# Patient Record
Sex: Male | Born: 1955
Health system: Southern US, Community
[De-identification: ages and names within clinical notes are randomized; demographics above are authoritative.]

## PROBLEM LIST (undated history)

## (undated) DIAGNOSIS — J302 Other seasonal allergic rhinitis: Secondary | ICD-10-CM

## (undated) DIAGNOSIS — C20 Malignant neoplasm of rectum: Secondary | ICD-10-CM

## (undated) DIAGNOSIS — D649 Anemia, unspecified: Secondary | ICD-10-CM

## (undated) DIAGNOSIS — T7840XA Allergy, unspecified, initial encounter: Secondary | ICD-10-CM

## (undated) HISTORY — PX: COLONOSCOPY: SHX174

## (undated) HISTORY — DX: Other seasonal allergic rhinitis: J30.2

## (undated) HISTORY — PX: TONSILLECTOMY: SUR1361

## (undated) HISTORY — DX: Allergy, unspecified, initial encounter: T78.40XA

## (undated) HISTORY — PX: WRIST SURGERY: SHX841

---

## 2000-11-04 ENCOUNTER — Emergency Department (HOSPITAL_COMMUNITY): Admission: EM | Admit: 2000-11-04 | Discharge: 2000-11-04 | Payer: Self-pay | Admitting: Emergency Medicine

## 2002-01-17 ENCOUNTER — Encounter: Payer: Self-pay | Admitting: Emergency Medicine

## 2002-01-17 ENCOUNTER — Emergency Department (HOSPITAL_COMMUNITY): Admission: EM | Admit: 2002-01-17 | Discharge: 2002-01-17 | Payer: Self-pay

## 2002-01-21 ENCOUNTER — Emergency Department (HOSPITAL_COMMUNITY): Admission: EM | Admit: 2002-01-21 | Discharge: 2002-01-21 | Payer: Self-pay | Admitting: Emergency Medicine

## 2002-07-31 ENCOUNTER — Ambulatory Visit (HOSPITAL_BASED_OUTPATIENT_CLINIC_OR_DEPARTMENT_OTHER): Admission: RE | Admit: 2002-07-31 | Discharge: 2002-07-31 | Payer: Self-pay | Admitting: Orthopedic Surgery

## 2003-03-24 ENCOUNTER — Emergency Department (HOSPITAL_COMMUNITY): Admission: EM | Admit: 2003-03-24 | Discharge: 2003-03-24 | Payer: Self-pay | Admitting: Emergency Medicine

## 2004-03-20 ENCOUNTER — Emergency Department (HOSPITAL_COMMUNITY): Admission: EM | Admit: 2004-03-20 | Discharge: 2004-03-20 | Payer: Self-pay | Admitting: Emergency Medicine

## 2005-02-04 ENCOUNTER — Emergency Department (HOSPITAL_COMMUNITY): Admission: EM | Admit: 2005-02-04 | Discharge: 2005-02-04 | Payer: Self-pay | Admitting: Family Medicine

## 2005-05-05 ENCOUNTER — Emergency Department (HOSPITAL_COMMUNITY): Admission: EM | Admit: 2005-05-05 | Discharge: 2005-05-05 | Payer: Self-pay | Admitting: Emergency Medicine

## 2005-05-28 ENCOUNTER — Emergency Department (HOSPITAL_COMMUNITY): Admission: EM | Admit: 2005-05-28 | Discharge: 2005-05-28 | Payer: Self-pay | Admitting: *Deleted

## 2005-05-31 ENCOUNTER — Emergency Department (HOSPITAL_COMMUNITY): Admission: EM | Admit: 2005-05-31 | Discharge: 2005-05-31 | Payer: Self-pay | Admitting: Emergency Medicine

## 2005-06-04 ENCOUNTER — Ambulatory Visit (HOSPITAL_COMMUNITY): Admission: RE | Admit: 2005-06-04 | Discharge: 2005-06-04 | Payer: Self-pay | Admitting: Orthopedic Surgery

## 2005-12-17 ENCOUNTER — Emergency Department (HOSPITAL_COMMUNITY): Admission: EM | Admit: 2005-12-17 | Discharge: 2005-12-17 | Payer: Self-pay | Admitting: Emergency Medicine

## 2006-04-25 ENCOUNTER — Emergency Department (HOSPITAL_COMMUNITY): Admission: EM | Admit: 2006-04-25 | Discharge: 2006-04-25 | Payer: Self-pay | Admitting: Emergency Medicine

## 2011-08-15 ENCOUNTER — Emergency Department (HOSPITAL_COMMUNITY)
Admission: EM | Admit: 2011-08-15 | Discharge: 2011-08-16 | Disposition: A | Discharge status: Home / Self Care | Payer: Self-pay | Attending: Emergency Medicine | Admitting: Emergency Medicine

## 2011-08-15 DIAGNOSIS — W540XXA Bitten by dog, initial encounter: Secondary | ICD-10-CM | POA: Insufficient documentation

## 2011-08-15 DIAGNOSIS — S61209A Unspecified open wound of unspecified finger without damage to nail, initial encounter: Secondary | ICD-10-CM | POA: Insufficient documentation

## 2011-09-14 ENCOUNTER — Emergency Department (HOSPITAL_COMMUNITY)
Admission: EM | Admit: 2011-09-14 | Discharge: 2011-09-14 | Disposition: A | Discharge status: Home / Self Care | Payer: Self-pay | Attending: Emergency Medicine | Admitting: Emergency Medicine

## 2011-09-14 DIAGNOSIS — J309 Allergic rhinitis, unspecified: Secondary | ICD-10-CM | POA: Insufficient documentation

## 2011-09-14 DIAGNOSIS — Z4802 Encounter for removal of sutures: Secondary | ICD-10-CM | POA: Insufficient documentation

## 2011-09-14 MED ORDER — LORATADINE 10 MG PO TABS: 10.0000 mg | ORAL_TABLET | Freq: Every day | ORAL | Status: DC

## 2011-09-14 NOTE — ED Provider Notes (Signed)
History     CSN: [id number] Arrival date & time: 09/14/2011  4:33 PM   First MD Initiated Contact with Patient 09/14/11 1641      No chief complaint on file.   (Consider location/radiation/quality/duration/timing/severity/associated sxs/prior treatment) Patient is a 55 y.o. male presenting with suture removal. The history is provided by the patient.  Suture / Staple Removal  The sutures were placed more than 14 days ago. Treatments since wound repair include regular peroxide and water cleansings. There has been no drainage from the wound. There is no redness present. There is no swelling present. The pain has no pain. He has no difficulty moving the affected extremity or digit.  Pt had 5 loose sutures placed to L thumb about 3 weeks ago s/p dog bite. He has been keeping the area clean and dry and cleaning it regularly with peroxide. Presents today for suture removal; states that one of the sutures fell out on its own but the others are intact. Denies fever, drainage from the site.  He also asks if there's anything he can take over the counter for seasonal allergy sx. States he is sick every year about this time with similar sx - postnasal drip, rhinorrhea, slight cough. Denies SOB, chest tightness/pain. Has been using Tylenol Allergy along with Afrin for the past 2 days.  History reviewed. No pertinent past medical history.  History reviewed. No pertinent past surgical history.  No family history on file.  History  Substance Use Topics  . Smoking status: Not on file  . Smokeless tobacco: Not on file  . Alcohol Use: Not on file      Review of Systems  Constitutional: Negative.   HENT: Positive for congestion, rhinorrhea and postnasal drip.   Respiratory: Positive for cough.   Musculoskeletal: Negative for myalgias.  Skin: Positive for wound. Negative for color change and pallor.  Neurological: Negative for numbness.    Allergies  Review of patient's allergies indicates no  known allergies.  Home Medications  No current outpatient prescriptions on file.  BP 144/98  Pulse 87  Temp 98.7 F (37.1 C)  Resp 18  SpO2 99%  Physical Exam  Nursing note and vitals reviewed. Constitutional: He is oriented to person, place, and time. He appears well-developed and well-nourished. No distress.  HENT:  Head: Normocephalic and atraumatic.  Right Ear: External ear normal.  Left Ear: External ear normal.  Nose: Nose normal.  Mouth/Throat: Oropharynx is clear and moist. No oropharyngeal exudate.  Eyes: Conjunctivae are normal. Pupils are equal, round, and reactive to light.  Neck: Normal range of motion.  Cardiovascular: Normal rate and regular rhythm.   Pulmonary/Chest: Effort normal and breath sounds normal. He has no wheezes.  Musculoskeletal: Normal range of motion.  Neurological: He is alert and oriented to person, place, and time.  Skin: Skin is warm and dry. He is not diaphoretic.       Wound to L thumb well healing; dead skin sloughing from area. 4 loose sutures are in place to palmar aspect, do not appear ingrown; no signs of cellulitis or drainage around the area.    ED Course  SUTURE REMOVAL Performed by: Grant Fontana Authorized by: Grant Fontana Consent: Verbal consent obtained. Risks and benefits: risks, benefits and alternatives were discussed Consent given by: patient Time out: Immediately prior to procedure a "time out" was called to verify the correct patient, procedure, equipment, support staff and site/side marked as required. Body area: upper extremity Location details: left thumb Wound Appearance:  clean Sutures Removed: 4 Post-removal: antibiotic ointment applied and dressing applied Facility: sutures placed in this facility Patient tolerance: Patient tolerated the procedure well with no immediate complications.   (including critical care time)    Labs Reviewed - No data to display No results found.   1. Visit for  suture removal   2. Allergic rhinitis       MDM  Pt tolerated removal well. He was instructed to continue to keep the area clean/dry to promote further wound healing.  Discussed treatment for allergic rhinitis. VSS, pt nontoxic appearing. Exam nonfocal. He was given rx for Claritin. Discussed rhinitis medicamentosa with Afrin; he was instructed not to use for more than 3-5 days. Discussed the use of a neti pot or saline nasal spray instead. Pt verbalized understanding and agreed to plan.        Grant Fontana, [place] 09/15/11 1041

## 2011-09-14 NOTE — ED Notes (Signed)
Patient here to have sutures out from left thumb, in place about 3 weeks, also reports cold symptoms and cough

## 2011-09-15 NOTE — ED Provider Notes (Signed)
Medical screening examination/treatment/procedure(s) were performed by non-physician practitioner and as supervising physician I was immediately available for consultation/collaboration.  Zavian Slowey, MD 09/15/11 1335 

## 2014-02-18 ENCOUNTER — Emergency Department (HOSPITAL_COMMUNITY)
Admission: EM | Admit: 2014-02-18 | Discharge: 2014-02-18 | Disposition: A | Discharge status: Home / Self Care | Payer: Self-pay | Attending: Emergency Medicine | Admitting: Emergency Medicine

## 2014-02-18 ENCOUNTER — Encounter (HOSPITAL_COMMUNITY): Payer: Self-pay | Admitting: Emergency Medicine

## 2014-02-18 DIAGNOSIS — H6120 Impacted cerumen, unspecified ear: Secondary | ICD-10-CM | POA: Insufficient documentation

## 2014-02-18 DIAGNOSIS — Z79899 Other long term (current) drug therapy: Secondary | ICD-10-CM | POA: Insufficient documentation

## 2014-02-18 NOTE — Discharge Instructions (Signed)
Please call your doctor for a followup appointment within 24-48 hours. When you talk to your doctor please let them know that you were seen in the emergency department and have them acquire all of your records so that they can discuss the findings with you and formulate a treatment plan to fully care for your new and ongoing problems. Please call and set-up an appointment with your primary care provider to be re-assessed within the next 24-48 hours Please rest and stay hydrated Please avoid qtips - please use rag to wash the ear  Please continue to monitor symptoms closely and if symptoms are to worsen or change (fever greater than 101, chills, sweating, swelling to the ear, ear pain worsens or changes, numbness, tingling, bleeding from the ear) please report back to the ED immediately    Cerumen Impaction A cerumen impaction is when the wax in your ear forms a plug. This plug usually causes reduced hearing. Sometimes it also causes an earache or dizziness. Removing a cerumen impaction can be difficult and painful. The wax sticks to the ear canal. The canal is sensitive and bleeds easily. If you try to remove a heavy wax buildup with a cotton tipped swab, you may push it in further. Irrigation with water, suction, and small ear curettes may be used to clear out the wax. If the impaction is fixed to the skin in the ear canal, ear drops may be needed for a few days to loosen the wax. People who build up a lot of wax frequently can use ear wax removal products available in your local drugstore. SEEK MEDICAL CARE IF:  You develop an earache, increased hearing loss, or marked dizziness. Document Released: 11/12/2004 Document Revised: 12/28/2011 Document Reviewed: 01/02/2010 [facility] Patient Information 2014 [place], [place].   Emergency Department Resource Guide 1) Find a Doctor and Pay Out of Pocket Although you won't have to find out who is covered by your insurance plan, it is a good idea to ask  around and get recommendations. You will then need to call the office and see if the doctor you have chosen will accept you as a new patient and what types of options they offer for patients who are self-pay. Some doctors offer discounts or will set up payment plans for their patients who do not have insurance, but you will need to ask so you aren't surprised when you get to your appointment.  2) Contact Your Local Health Department Not all health departments have doctors that can see patients for sick visits, but many do, so it is worth a call to see if yours does. If you don't know where your local health department is, you can check in your phone book. The CDC also has a tool to help you locate your state's health department, and many state websites also have listings of all of their local health departments.  3) Find a [facility] If your illness is not likely to be very severe or complicated, you may want to try a walk in clinic. These are popping up all over the country in pharmacies, drugstores, and shopping centers. They're usually staffed by nurse practitioners or physician assistants that have been trained to treat common illnesses and complaints. They're usually fairly quick and inexpensive. However, if you have serious medical issues or chronic medical problems, these are probably not your best option.  No Primary Care Doctor: - Call Health Connect at  [phone] - they can help you locate a primary care doctor that  accepts your insurance, provides certain services, etc. - Physician Referral Service- [phone]  Chronic Pain Problems: Organization         Address  Phone   Notes  [facility]  [phone] Patients need to be referred by their primary care doctor.   Medication Assistance: Organization         Address  Phone   Notes  [facility] Medication [facility] [address] [place], [place] [address] [phone] --Must be a  resident of [facility] -- Must have NO insurance coverage whatsoever (no Medicaid/ Medicare, etc.) -- The pt. MUST have a primary care doctor that directs their care regularly and follows them in the community   MedAssist  [phone]   [employer]  [phone]    Agencies that provide inexpensive medical care: Organization         Address  Phone   Notes  [place]  [phone]   [facility]    [phone]   [facility] [place], [place] [address] [phone]   [place] [address], [place] [phone]   Planned Parenthood    [phone]   [facility]    [phone]   [place] [address], [place] Phone:  [phone], Fax:  [phone] Hours of Operation:  9 am - 6 pm, M-F.  Also accepts Medicaid/Medicare and self-pay.  [facility] [address], [address], [place] Phone: [phone], Fax: [phone]. Hours of Operation:  8:30 am - 5:30 pm, M-F.  Also accepts Medicaid and self-pay.  [facility] [place] [address] [place] Phone: [phone]   [place], [place], [place] [phone], Ext. [phone] Mondays & Thursdays: 7-9 AM.  First 15 patients are seen on a first come, first serve basis.    [place] Providers:  Organization         Address  Phone   Notes  [facility] [address], [place] [phone] Also accepts self-pay patients.  [facility] [address], [place]  [phone]   [place], [address], [place] [phone]   [facility] [address] [place] [phone]   Lucianne Lei [address], [place]   [phone] Only accepts  [place] Access [place] patients after they have their name applied to their card.   Self-Pay (no insurance) in [facility]:  Organization         Address  Phone   Notes  Sickle Cell Patients, [facility] Internal Medicine [place] [phone]   [facility] [place] [phone]   [facility] [place]  [address], [place], [place] [phone]   [facility]/Dr. Osei-Bonsu  [address] [place] or [address], [place] [phone] Phone number for both [place] and [place] locations is the same.  [facility]gent [facility] and [facility] [address], [place] [phone]   [facility] [address], [place] or [address] [phone]   [facility] [place] [phone], phone; [phone], fax Sees patients 1st and 3rd Saturday of every month.  Must not qualify for public or private insurance (i.e. Medicaid, Medicare, [facility] Health Choice, Veterans' Benefits)  Household income should be no more than 200% of the poverty level The clinic cannot treat you if you are pregnant or think you are pregnant  Sexually transmitted diseases are not treated at the clinic.    Dental Care: Organization         Address  Phone  Notes  [facility] Department of [facility] [place] [phone] Accepts children up to age 51 who are enrolled in [place] or [place]; pregnant women with a Medicaid card; and children who have applied for Medicaid or [place] Health Choice, but were declined, whose parents can pay a reduced fee at time of service.  [facility] Department of [facility] [place] [address]  [address], [place] [phone] Accepts children up to age 40 who are enrolled in [place] or [place]; pregnant women  with a Medicaid card; and children who have applied for Medicaid or [place] Health Choice, but were declined, whose parents can pay a reduced fee at time of service.  [place] Adult Dental Access PROGRAM  [place] [phone] Patients are seen by appointment only. Walk-ins are not accepted. [place] will see patients 63 years of age and older. Monday - Tuesday (8am-5pm) Most Wednesdays (8:30-5pm) $30 per visit, cash only  [facility] Adult Dental Access PROGRAM  [address], [facility] [phone] Patients are seen by appointment only. Walk-ins are not accepted. [place] will see patients 73 years of age and older. One Wednesday Evening (Monthly: Volunteer Based).  $30 per visit, cash only  [place]  [phone] for adults; Children under age 1, call Graduate Pediatric Dentistry at [phone]. Children aged 46-14, please call [phone] to request a pediatric application.  Dental services are provided in all areas of dental care including fillings, crowns and bridges, complete and partial dentures, implants, gum treatment, root canals, and extractions. Preventive care is also provided. Treatment is provided to both adults and children. Patients are selected via a lottery and there is often a waiting list.   [facility] [address] [place]  [phone] [email or web address]   [facility] [address] [place], [place] [phone], Ext. [phone] Second and Fourth Thursday of each month, opens at 6:30 AM; Clinic ends at 9 AM.  Patients are seen on a first-come first-served basis, and a limited number are seen during each clinic.   [facility]  [address] Hillard Danker [place], [place] [phone]   Eligibility Requirements You must have lived in [place], [place], or [place] counties for at least the last three months.   You cannot be eligible for state or federal sponsored [employer], including [employer], [place], or [employer].   You generally cannot be eligible for healthcare insurance through your employer.    How to apply: Eligibility screenings are held every Tuesday and Wednesday afternoon from 1:00 pm until 4:00 pm. You do not need an appointment for the interview!  [facility] [address] [place], [place]   [place]  [place] Department  [place] Department  [phone]    Behavioral Health Resources in the Community: Intensive Outpatient Programs Organization         Address  Phone  Notes  [facility] Services [address] [place], [place] [phone]   [facility] Outpatient [address] [place], [place] [phone]   ADS: Alcohol & Drug Svcs [address] [place], [place]  [phone]   [facility] Mental Health [address],  [place], [place] [phone] or [phone]   Substance Abuse Resources Organization         Address  Phone  Notes  Alcohol and Drug Services  [phone]   [facility]  [phone]   [facility] [place]  [phone]   Floydene Flock  [phone]   Residential & Outpatient Substance Abuse Program  [phone]   Psychological Services Organization         Address  Phone  Notes  [facility] Behavioral Health  [phone]   [facility] Services  [phone]   [facility] Mental Health [address], [place] [phone] or [phone]    Mobile Crisis Teams Organization         Address  Phone  Notes  [facility], Mobile Crisis Care Unit  [phone]   [facility]  [address] [place], [place] [phone]   Doristine Locks [address] [place] [phone]    Self-Help/Support Groups Organization         Address  Phone             Notes  Mental  Health Assoc. of [place] - variety of support groups  [phone]- [phone] Call for more information  Narcotics Anonymous (NA), [facility] [address], [employer] [place]  2 meetings at this location   Statistician         Address  Phone  Notes  [facility] [address],    [place]  [phone]   [facility]  [address] [place] [address], [place], [place] [phone]   [facility] [address] [place], [place] [phone] Admissions: 8am-3pm M-F  [facility] [address],    [place], [place] [phone]   [facility] [address] [place], [place], [place] [phone]   [facility] [address],  [place], [place] [phone]   [facility] - Intensive Outpatient [address], [place], [place] [phone]   [facility] (Addiction Recovery Care Assoc.) [address] [place].,  [place], [place] [phone] or [phone]   [facility] Services (RTS) [address] [place], [place] [phone] Accepts Medicaid  [facility] [place] [address]  [place] [phone] Substance Abuse/Addiction Treatment   [facility] Organization         Address  Phone  Notes  [facility]  2126845069   Angie Fava, PhD [address] Ervin Knack [place], [place]   [phone] or [phone]   [facility] Behavioral   [address] [place], [place] [phone]   [facility] [address] [place], [place] [phone] Insurance/Medicaid/sponsorship through [employer] and Families [address]                                    [place], [place] [phone] Therapy/tele-psych/case  [facility] [address] [place]  [address]   [place], [place] [phone]    Dr. Lolly MustacheArfeen  [phone]   [facility] [place]  [employer] [facility] 1) [address],  [place] 2) [address], [place] 3)  [address], [place] [phone]  [phone]   [facility] Child Abuse Hotline [phone]) [phone] or [phone]) [phone] (After Hours)

## 2014-02-18 NOTE — ED Provider Notes (Signed)
CSN: [id number]     Arrival date & time 02/18/14  1450 History  This chart was scribed for non-physician practitioner, Jamse Mead, PA-C working with Kathalene Frames, MD by Frederich Balding, ED scribe. This patient was seen in room [place] and the patient's care was started at 5:00 PM.   Chief Complaint  Patient presents with  . Foreign Body in [place]   The history is provided by the patient. No language interpreter was used.   HPI Comments: Phillip Brooks is a 58 y.o. male who presents to the Emergency Department complaining of a possible foreign body in his left ear that he noticed about 2.5 weeks ago. Pt does a lot of outdoor work and thinks something might have gotten in his ear. He has associated tinnitus and mild hearing loss. Pt has used an OTC ear wax removal (carbamide peroxide 6.5%) in case it was earwax with no relief. He states he has had congestion but it is due to seasonal allergies. Denies fever, chills, sore throat, trouble swallowing, ear pain, ear swelling, ear discharge, blurred vision, sudden loss of vision, chest pain, SOB, cough, difficulty breathing, neck pain, headaches.  History reviewed. No pertinent past medical history. History reviewed. No pertinent past surgical history. History reviewed. No pertinent family history. History  Substance Use Topics  . Smoking status: Never Smoker   . Smokeless tobacco: Not on file  . Alcohol Use: Yes    Review of Systems  Constitutional: Negative for fever and chills.  HENT: Positive for hearing loss and tinnitus. Negative for ear discharge, ear pain, sore throat and trouble swallowing.   Eyes: Negative for visual disturbance.  Respiratory: Negative for cough and shortness of breath.   Cardiovascular: Negative for chest pain.  Musculoskeletal: Negative for neck pain.  Neurological: Negative for headaches.  All other systems reviewed and are negative.  Allergies  Review of patient's allergies indicates no known  allergies.  Home Medications   Prior to Admission medications   Medication Sig Start Date End Date Taking? Authorizing Provider  loratadine (CLARITIN) 10 MG tablet Take 1 tablet (10 mg total) by mouth daily. 09/14/11 09/13/12  Abran Richard, PA-C   BP 159/99  Pulse 76  Temp(Src) 97.7 F (36.5 C) (Oral)  Resp 16  SpO2 98%  Physical Exam  Nursing note and vitals reviewed. Constitutional: He is oriented to person, place, and time. He appears well-developed and well-nourished. No distress.  HENT:  Head: Normocephalic and atraumatic.  Right Ear: Hearing, external ear and ear canal normal. No lacerations. No drainage, swelling or tenderness. No foreign bodies. No mastoid tenderness. Tympanic membrane is not injected, not scarred, not perforated, not erythematous, not retracted and not bulging. No middle ear effusion. No hemotympanum. No decreased hearing is noted.  Left Ear: External ear and ear canal normal. No lacerations. No drainage, swelling or tenderness. No foreign bodies. No mastoid tenderness.  No middle ear effusion. No hemotympanum. Decreased hearing is noted.  Mouth/Throat: Oropharynx is clear and moist. No oropharyngeal exudate.  Cerumen impaction to the left ear - soft.  Eyes: Conjunctivae and EOM are normal. Pupils are equal, round, and reactive to light. Right eye exhibits no discharge. Left eye exhibits no discharge.  Neck: Normal range of motion. Neck supple. No tracheal deviation present.  Negative neck stiffness Negative nuchal rigidity Negative cervical lymphadenopathy Negative pain upon palpation to the c-spine  Cardiovascular: Normal rate, regular rhythm and normal heart sounds.   Pulses:      Radial pulses are  2+ on the right side, and 2+ on the left side.  Pulmonary/Chest: Effort normal and breath sounds normal. No respiratory distress. He has no wheezes. He has no rales.  Patient is able to speak without difficulty Negative use of accessory  muscles Negative stridor  Musculoskeletal: Normal range of motion.  Lymphadenopathy:    He has no cervical adenopathy.  Neurological: He is alert and oriented to person, place, and time.  Skin: Skin is warm and dry.  Psychiatric: He has a normal mood and affect. His behavior is normal.    ED Course  Procedures (including critical care time)  DIAGNOSTIC STUDIES: Oxygen Saturation is 99% on RA, normal by my interpretation.    COORDINATION OF CARE: 5:06 PM-Discussed treatment plan which includes irrigating ear with pt at bedside and pt agreed to plan.   Labs Review Labs Reviewed - No data to display  Imaging Review No results found.   EKG Interpretation None      MDM   Final diagnoses:  Hearing loss secondary to cerumen impaction    Filed Vitals:   02/18/14 1457 02/18/14 1759  BP: 159/99   Pulse: 78 76  Temp: 97.7 F (36.5 C)   TempSrc: Oral   Resp: 16   SpO2: 99% 98%   I personally performed the services described in this documentation, which was scribed in my presence. The recorded information has been reviewed and is accurate.  Cerumen identified in the left ear. This provider removed cerumen by using pediatric curette-cerumen soft, patient tolerated procedure well. Ear was irrigated using warm normal saline by nurse - patient tolerated procedure well. Decent amount of cerumen removed. Inner ear canal seen within negative inflammation, erythema, lesions, sores. TM identified with negative perforation, bulging or signs of infection. Patient able to hear well after the cerumen was removed. Negative tinnitus or hearing loss identified. Patient had instantaneous relief. Patient stable, afebrile. Patient in no signs of distress. Patient tolerated procedure of curette and irrigation well. Discharged patient. Discussed with patient proper ear care. [facility]. Discussed with patient to closely monitor symptoms and if symptoms are  to worsen or change to report back to the ED - strict return instructions given.  Patient agreed to plan of care, understood, all questions answered.   Jamse Mead, PA-C 02/18/14 2120

## 2014-02-18 NOTE — ED Provider Notes (Signed)
Medical screening examination/treatment/procedure(s) were performed by non-physician practitioner and as supervising physician I was immediately available for consultation/collaboration.    Kathalene Frames, MD 02/18/14 [phone]

## 2014-02-18 NOTE — ED Notes (Signed)
Pt states that his lt ear feels like something is crawling in it x 2 wks.

## 2014-02-18 NOTE — ED Notes (Signed)
Reports some hearing loss in left ear and states "I feel something  Moving in there and it's ringing."

## 2016-04-07 ENCOUNTER — Emergency Department (HOSPITAL_COMMUNITY): Payer: Self-pay

## 2016-04-07 ENCOUNTER — Emergency Department (HOSPITAL_COMMUNITY)
Admission: EM | Admit: 2016-04-07 | Discharge: 2016-04-07 | Disposition: A | Discharge status: Home / Self Care | Payer: Self-pay | Attending: Emergency Medicine | Admitting: Emergency Medicine

## 2016-04-07 ENCOUNTER — Encounter (HOSPITAL_COMMUNITY): Payer: Self-pay | Admitting: Emergency Medicine

## 2016-04-07 DIAGNOSIS — W01198A Fall on same level from slipping, tripping and stumbling with subsequent striking against other object, initial encounter: Secondary | ICD-10-CM | POA: Insufficient documentation

## 2016-04-07 DIAGNOSIS — Y990 Civilian activity done for income or pay: Secondary | ICD-10-CM | POA: Insufficient documentation

## 2016-04-07 DIAGNOSIS — S0191XA Laceration without foreign body of unspecified part of head, initial encounter: Secondary | ICD-10-CM

## 2016-04-07 DIAGNOSIS — R55 Syncope and collapse: Secondary | ICD-10-CM | POA: Insufficient documentation

## 2016-04-07 DIAGNOSIS — S0101XA Laceration without foreign body of scalp, initial encounter: Secondary | ICD-10-CM | POA: Insufficient documentation

## 2016-04-07 DIAGNOSIS — R4020 Unspecified coma: Secondary | ICD-10-CM

## 2016-04-07 DIAGNOSIS — W19XXXA Unspecified fall, initial encounter: Secondary | ICD-10-CM

## 2016-04-07 DIAGNOSIS — Y939 Activity, unspecified: Secondary | ICD-10-CM | POA: Insufficient documentation

## 2016-04-07 DIAGNOSIS — Y929 Unspecified place or not applicable: Secondary | ICD-10-CM | POA: Insufficient documentation

## 2016-04-07 DIAGNOSIS — Z79899 Other long term (current) drug therapy: Secondary | ICD-10-CM | POA: Insufficient documentation

## 2016-04-07 DIAGNOSIS — M25511 Pain in right shoulder: Secondary | ICD-10-CM | POA: Insufficient documentation

## 2016-04-07 MED ORDER — OXYCODONE-ACETAMINOPHEN 5-325 MG PO TABS
1.0000 | ORAL_TABLET | Freq: Once | ORAL | Status: AC
Start: 2016-04-07 — End: 2016-04-07
  Administered 2016-04-07: 1 via ORAL
  Filled 2016-04-07: qty 1

## 2016-04-07 MED ORDER — NAPROXEN 250 MG PO TABS: 250.0000 mg | ORAL_TABLET | Freq: Two times a day (BID) | ORAL | Status: DC

## 2016-04-07 MED ORDER — TETANUS-DIPHTH-ACELL PERTUSSIS 5-2.5-18.5 LF-MCG/0.5 IM SUSP
0.5000 mL | Freq: Once | INTRAMUSCULAR | Status: AC
  Administered 2016-04-07: 0.5 mL via INTRAMUSCULAR
  Filled 2016-04-07: qty 0.5

## 2016-04-07 MED ORDER — BACITRACIN ZINC 500 UNIT/GM EX OINT
1.0000 "application " | TOPICAL_OINTMENT | Freq: Two times a day (BID) | CUTANEOUS | Status: DC
  Administered 2016-04-07: 1 via TOPICAL
  Filled 2016-04-07: qty 0.9

## 2016-04-07 MED ORDER — OXYCODONE-ACETAMINOPHEN 5-325 MG PO TABS
1.0000 | ORAL_TABLET | Freq: Once | ORAL | Status: AC
  Administered 2016-04-07: 1 via ORAL
  Filled 2016-04-07: qty 1

## 2016-04-07 MED ORDER — BACITRACIN ZINC 500 UNIT/GM EX OINT: 1.0000 "application " | TOPICAL_OINTMENT | Freq: Two times a day (BID) | CUTANEOUS | Status: DC

## 2016-04-07 NOTE — ED Notes (Signed)
Pt ambulatory to waiting room with coworkers. NAD. Refused wheelchair. Verbalizes understanding of discharge instructions.

## 2016-04-07 NOTE — ED Provider Notes (Signed)
CSN: [id number]     Arrival date & time 04/07/16  1202 History   First MD Initiated Contact with Patient 04/07/16 1213     Chief Complaint  Patient presents with  . Fall  . Loss of Consciousness    Phillip Brooks is a 60 y.o. male who presents to the ED after a fall with LOC. 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 This caused him to fall backwards onto his head onto concrete. He had positive loss of consciousness that was witnessed. No seizure-like activity. He complains of pain to the back of his head, right neck pain and right shoulder pain. He reports initially when he came to he felt all his extremities were numb and he could not move them. He reports within seconds his sensation and function returned. He reports feeling back to normal currently as far as his sensation and movement. He is unsure when his last tetanus shot was. He is not on anticoagulants. The patient denies fevers, cough, chest pain, abdominal pain, nausea, vomiting, diarrhea, urinary symptoms, double vision.   Patient is a 60 y.o. male presenting with fall and syncope. The history is provided by the patient. No language interpreter was used.  Fall Associated symptoms include headaches and neck pain. Pertinent negatives include no abdominal pain, chest pain, chills, coughing, fever, nausea, numbness, rash, vomiting or weakness.  Loss of Consciousness Associated symptoms: headaches   Associated symptoms: no chest pain, no dizziness, no fever, no nausea, no seizures, no shortness of breath, no vomiting and no weakness     History reviewed. No pertinent past medical history. History reviewed. No pertinent past surgical history. History reviewed. No pertinent family history. Social History  Substance Use Topics  . Smoking status: Never Smoker   . Smokeless tobacco: None  . Alcohol Use: Yes    Review of Systems  Constitutional:  Negative for fever and chills.  HENT: Negative for ear discharge, ear pain, nosebleeds and rhinorrhea.   Eyes: Negative for pain and visual disturbance.  Respiratory: Negative for cough and shortness of breath.   Cardiovascular: Positive for syncope. Negative for chest pain.  Gastrointestinal: Negative for nausea, vomiting, abdominal pain and diarrhea.  Genitourinary: Negative for dysuria, hematuria and difficulty urinating.  Musculoskeletal: Positive for neck pain. Negative for back pain.  Skin: Positive for wound. Negative for rash.  Neurological: Positive for headaches. Negative for dizziness, seizures, speech difficulty, weakness, light-headedness and numbness.      Allergies  Review of patient's allergies indicates no known allergies.  Home Medications   Prior to Admission medications   Medication Sig Start Date End Date Taking? Authorizing Provider  bacitracin ointment Apply 1 application topically 2 (two) times daily. 04/07/16   Waynetta Pean, PA-C  loratadine (CLARITIN) 10 MG tablet Take 1 tablet (10 mg total) by mouth daily. 09/14/11 09/13/12  Abran Richard, PA-C  naproxen (NAPROSYN) 250 MG tablet Take 1 tablet (250 mg total) by mouth 2 (two) times daily with a meal. 04/07/16   Waynetta Pean, PA-C   BP 134/95 mmHg  Pulse 77  Temp(Src) 97.5 F (36.4 C) (Oral)  Resp 19  Ht 5\' 6"  (1.676 m)  Wt 68.04 kg  BMI 24.22 kg/m2  SpO2 97% Physical Exam  Constitutional: He is oriented to person, place, and time. He appears well-developed and well-nourished. No distress.  Nontoxic appearing.  HENT:  Head: Normocephalic and atraumatic.  Right  Ear: External ear normal.  Left Ear: External ear normal.  Mouth/Throat: Oropharynx is clear and moist.  5 cm irregular laceration to his posterior head. Bleeding is controlled. No other signs of head trauma.  Bilateral tympanic membranes are pearly-gray without erythema or loss of landmarks.   Eyes: Conjunctivae and EOM are normal.  Pupils are equal, round, and reactive to light. Right eye exhibits no discharge. Left eye exhibits no discharge.  Neck: Neck supple. No tracheal deviation present.  Wearing C-collar.   Cardiovascular: Normal rate, regular rhythm, normal heart sounds and intact distal pulses.  Exam reveals no gallop and no friction rub.   No murmur heard. Pulmonary/Chest: Effort normal and breath sounds normal. No stridor. No respiratory distress. He has no wheezes. He has no rales. He exhibits no tenderness.  Lungs are clear to auscultation bilaterally. No crepitus. Symmetric chest expansion bilaterally.  Abdominal: Soft. He exhibits no distension. There is no tenderness. There is no guarding.  Abdomen is soft and nontender to palpation.  Musculoskeletal: Normal range of motion. He exhibits tenderness. He exhibits no edema.  Patient has mild tenderness over his right scapula. No overlying skin changes. No clavicle tenderness bilaterally. No midline back tenderness. His bilateral elbow, wrists, hips, knee and ankle joints are supple and nontender to palpation. His good range of motion of his bilateral shoulders without pain. No deformity noted.  Lymphadenopathy:    He has no cervical adenopathy.  Neurological: He is alert and oriented to person, place, and time. No cranial nerve deficit. Coordination normal.  The patient is alert and oriented 3. Cranial nerves are intact. Sensation is intact to his bilateral upper and lower extremities. Speech is clear and coherent.  Skin: Skin is warm and dry. No rash noted. He is not diaphoretic. No erythema. No pallor.  Psychiatric: He has a normal mood and affect. His behavior is normal.  Nursing note and vitals reviewed.   ED Course  .Marland KitchenLaceration Repair Date/Time: 04/07/2016 2:45 PM Performed by: Waynetta Pean Authorized by: Waynetta Pean Consent: Verbal consent obtained. Risks and benefits: risks, benefits and alternatives were discussed Consent given by:  patient Patient understanding: patient states understanding of the procedure being performed Patient consent: the patient's understanding of the procedure matches consent given Procedure consent: procedure consent matches procedure scheduled Relevant documents: relevant documents present and verified Test results: test results available and properly labeled Site marked: the operative site was marked Imaging studies: imaging studies available Required items: required blood products, implants, devices, and special equipment available Patient identity confirmed: arm band and verbally with patient Time out: Immediately prior to procedure a "time out" was called to verify the correct patient, procedure, equipment, support staff and site/side marked as required. Body area: head/neck Location details: scalp Laceration length: 5 cm Foreign bodies: no foreign bodies Tendon involvement: none Nerve involvement: none Vascular damage: no Patient sedated: no Preparation: Patient was prepped and draped in the usual sterile fashion. Irrigation solution: saline Irrigation method: jet lavage and syringe Amount of cleaning: extensive Debridement: none Degree of undermining: none Skin closure: staples Number of sutures: 5 Technique: simple Approximation: close Approximation difficulty: simple Dressing: non-adhesive packing strip and antibiotic ointment Patient tolerance: Patient tolerated the procedure well with no immediate complications   (including critical care time) Labs Review Labs Reviewed - No data to display  Imaging Review Dg Shoulder Right  04/07/2016  CLINICAL DATA:  The patient fell this morning with a right shoulder injury. Pain. Initial encounter. EXAM: RIGHT SHOULDER - 2+ VIEW  COMPARISON:  None. FINDINGS: No acute bony or joint abnormality is identified. Mild acromioclavicular degenerative change is noted. Image right lung and ribs appear normal. IMPRESSION: No acute abnormality.  Electronically Signed   By: Inge Rise M.D.   On: 04/07/2016 13:11   Ct Head Wo Contrast  04/07/2016  CLINICAL DATA:  Syncope and fall today with a blow to the head. Initial encounter. EXAM: CT HEAD WITHOUT CONTRAST CT CERVICAL SPINE WITHOUT CONTRAST TECHNIQUE: Multidetector CT imaging of the head and cervical spine was performed following the standard protocol without intravenous contrast. Multiplanar CT image reconstructions of the cervical spine were also generated. COMPARISON:  None. FINDINGS: CT HEAD FINDINGS There is mild cortical atrophy. No evidence of acute intracranial abnormality including hemorrhage, infarct, mass lesion, mass effect, midline shift or abnormal extra-axial fluid collection is identified. Hematoma over the posterior calvarium is noted. No underlying fracture or foreign body. Imaged paranasal sinuses and mastoid air cells are clear. No fracture. CT CERVICAL SPINE FINDINGS Vertebral height and alignment are maintained. Intervertebral disc space height is normal. Lung apices are clear. Paraspinous soft tissue structures are unremarkable. IMPRESSION: Scalp contusion posteriorly. Negative for underlying fracture or or acute intracranial abnormality. No acute abnormality cervical spine. Electronically Signed   By: Inge Rise M.D.   On: 04/07/2016 13:34   Ct Cervical Spine Wo Contrast  04/07/2016  CLINICAL DATA:  Syncope and fall today with a blow to the head. Initial encounter. EXAM: CT HEAD WITHOUT CONTRAST CT CERVICAL SPINE WITHOUT CONTRAST TECHNIQUE: Multidetector CT imaging of the head and cervical spine was performed following the standard protocol without intravenous contrast. Multiplanar CT image reconstructions of the cervical spine were also generated. COMPARISON:  None. FINDINGS: CT HEAD FINDINGS There is mild cortical atrophy. No evidence of acute intracranial abnormality including hemorrhage, infarct, mass lesion, mass effect, midline shift or abnormal extra-axial  fluid collection is identified. Hematoma over the posterior calvarium is noted. No underlying fracture or foreign body. Imaged paranasal sinuses and mastoid air cells are clear. No fracture. CT CERVICAL SPINE FINDINGS Vertebral height and alignment are maintained. Intervertebral disc space height is normal. Lung apices are clear. Paraspinous soft tissue structures are unremarkable. IMPRESSION: Scalp contusion posteriorly. Negative for underlying fracture or or acute intracranial abnormality. No acute abnormality cervical spine. Electronically Signed   By: Inge Rise M.D.   On: 04/07/2016 13:34   I have personally reviewed and evaluated these images and lab results as part of my medical decision-making.   EKG Interpretation None     Filed Vitals:   04/07/16 1210  BP: 134/95  Pulse: 77  Temp: 97.5 F (36.4 C)  TempSrc: Oral  Resp: 19  Height: 5\' 6"  (1.676 m)  Weight: 68.04 kg  SpO2: 97%    MDM   Meds given in ED:  Medications  bacitracin ointment 1 application (1 application Topical Given 04/07/16 1502)  oxyCODONE-acetaminophen (PERCOCET/ROXICET) 5-325 MG per tablet 1 tablet (1 tablet Oral Given 04/07/16 1339)  Tdap (BOOSTRIX) injection 0.5 mL (0.5 mLs Intramuscular Given 04/07/16 1340)  oxyCODONE-acetaminophen (PERCOCET/ROXICET) 5-325 MG per tablet 1 tablet (1 tablet Oral Given 04/07/16 1502)    New Prescriptions   BACITRACIN OINTMENT    Apply 1 application topically 2 (two) times daily.   NAPROXEN (NAPROSYN) 250 MG TABLET    Take 1 tablet (250 mg total) by mouth 2 (two) times daily with a meal.    Final diagnoses:  Fall, initial encounter  LOC (loss of consciousness)  Laceration of head, initial  encounter  Right shoulder pain   This is a 60 y.o. male who presents to the ED after a fall with LOC. 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 This caused him to fall backwards onto his head  onto concrete. He had positive loss of consciousness that was witnessed. No seizure-like activity. He complains of pain to the back of his head, right neck pain and right shoulder pain. He reports initially when he came to he felt all his extremities were numb and he could not move them. He reports within seconds his sensation and function returned. He reports feeling back to normal currently as far as his sensation and movement. On exam the patient is afebrile nontoxic appearing. He has no focal neurological deficits. He has a 5 cm irregular laceration to his posterior head. No other visible signs of injury. He is neurovascularly intact. CT head and neck are unremarkable. Right shoulder x-ray is unremarkable. Patient provided with updated Tdap in the emergency department. His laceration was repaired by me and tolerated well by the patient. It was repaired with 5 staples.  I discussed the signs and symptoms of a concussion. Discussed the expected treatment and course of a concussion. I advised he needs to refrain from strenuous activity until he is symptom-free and cleared by his primary care doctor or neurology. I encouraged him to use naproxen or tylenol for pain. I discussed strict and specific return precautions. I advised the patient to follow-up with their primary care provider this week. I advised the patient to return to the emergency department with new or worsening symptoms or new concerns. The patient verbalized understanding and agreement with plan.      Waynetta Pean, PA-C 04/07/16 1513  Julianne Rice, MD 04/08/16 1537

## 2016-04-07 NOTE — Discharge Instructions (Signed)
Your scans of your head and neck showed no concerning findings. Your shoulder x-ray was good. No strenuous activty until cleared by primary care or neurology for concussion.   Concussion, Adult A concussion, or closed-head injury, is a brain injury caused by a direct blow to the head or by a quick and sudden movement (jolt) of the head or neck. Concussions are usually not life-threatening. Even so, the effects of a concussion can be serious. If you have had a concussion before, you are more likely to experience concussion-like symptoms after a direct blow to the head.  CAUSES  Direct blow to the head, such as from running into another player during a soccer game, being hit in a fight, or hitting your head on a hard surface.  A jolt of the head or neck that causes the brain to move back and forth inside the skull, such as in a car crash. SIGNS AND SYMPTOMS The signs of a concussion can be hard to notice. Early on, they may be missed by you, family members, and health care providers. You may look fine but act or feel differently. Symptoms are usually temporary, but they may last for days, weeks, or even longer. Some symptoms may appear right away while others may not show up for hours or days. Every head injury is different. Symptoms include:  Mild to moderate headaches that will not go away.  A feeling of pressure inside your head.  Having more trouble than usual:  Learning or remembering things you have heard.  Answering questions.  Paying attention or concentrating.  Organizing daily tasks.  Making decisions and solving problems.  Slowness in thinking, acting or reacting, speaking, or reading.  Getting lost or being easily confused.  Feeling tired all the time or lacking energy (fatigued).  Feeling drowsy.  Sleep disturbances.  Sleeping more than usual.  Sleeping less than usual.  Trouble falling asleep.  Trouble sleeping (insomnia).  Loss of balance or feeling  lightheaded or dizzy.  Nausea or vomiting.  Numbness or tingling.  Increased sensitivity to:  Sounds.  Lights.  Distractions.  Vision problems or eyes that tire easily.  Diminished sense of taste or smell.  Ringing in the ears.  Mood changes such as feeling sad or anxious.  Becoming easily irritated or angry for little or no reason.  Lack of motivation.  Seeing or hearing things other people do not see or hear (hallucinations). DIAGNOSIS Your health care provider can usually diagnose a concussion based on a description of your injury and symptoms. He or she will ask whether you passed out (lost consciousness) and whether you are having trouble remembering events that happened right before and during your injury. Your evaluation might include:  A brain scan to look for signs of injury to the brain. Even if the test shows no injury, you may still have a concussion.  Blood tests to be sure other problems are not present. TREATMENT  Concussions are usually treated in an emergency department, in urgent care, or at a clinic. You may need to stay in the hospital overnight for further treatment.  Tell your health care provider if you are taking any medicines, including prescription medicines, over-the-counter medicines, and natural remedies. Some medicines, such as blood thinners (anticoagulants) and aspirin, may increase the chance of complications. Also tell your health care provider whether you have had alcohol or are taking illegal drugs. This information may affect treatment.  Your health care provider will send you home with important instructions  to follow.  How fast you will recover from a concussion depends on many factors. These factors include how severe your concussion is, what part of your brain was injured, your age, and how healthy you were before the concussion.  Most people with mild injuries recover fully. Recovery can take time. In general, recovery is slower in  older persons. Also, persons who have had a concussion in the past or have other medical problems may find that it takes longer to recover from their current injury. HOME CARE INSTRUCTIONS General Instructions  Carefully follow the directions your health care provider gave you.  Only take over-the-counter or prescription medicines for pain, discomfort, or fever as directed by your health care provider.  Take only those medicines that your health care provider has approved.  Do not drink alcohol until your health care provider says you are well enough to do so. Alcohol and certain other drugs may slow your recovery and can put you at risk of further injury.  If it is harder than usual to remember things, write them down.  If you are easily distracted, try to do one thing at a time. For example, do not try to watch TV while fixing dinner.  Talk with family members or close friends when making important decisions.  Keep all follow-up appointments. Repeated evaluation of your symptoms is recommended for your recovery.  Watch your symptoms and tell others to do the same. Complications sometimes occur after a concussion. Older adults with a brain injury may have a higher risk of serious complications, such as a blood clot on the brain.  Tell your teachers, school nurse, school counselor, coach, athletic trainer, or work Freight forwarder about your injury, symptoms, and restrictions. Tell them about what you can or cannot do. They should watch for:  Increased problems with attention or concentration.  Increased difficulty remembering or learning new information.  Increased time needed to complete tasks or assignments.  Increased irritability or decreased ability to cope with stress.  Increased symptoms.  Rest. Rest helps the brain to heal. Make sure you:  Get plenty of sleep at night. Avoid staying up late at night.  Keep the same bedtime hours on weekends and weekdays.  Rest during the day.  Take daytime naps or rest breaks when you feel tired.  Limit activities that require a lot of thought or concentration. These include:  Doing homework or job-related work.  Watching TV.  Working on the computer.  Avoid any situation where there is potential for another head injury (football, hockey, soccer, basketball, martial arts, downhill snow sports and horseback riding). Your condition will get worse every time you experience a concussion. You should avoid these activities until you are evaluated by the appropriate follow-up health care providers. Returning To Your Regular Activities You will need to return to your normal activities slowly, not all at once. You must give your body and brain enough time for recovery.  Do not return to sports or other athletic activities until your health care provider tells you it is safe to do so.  Ask your health care provider when you can drive, ride a bicycle, or operate heavy machinery. Your ability to react may be slower after a brain injury. Never do these activities if you are dizzy.  Ask your health care provider about when you can return to work or school. Preventing Another Concussion It is very important to avoid another brain injury, especially before you have recovered. In rare cases, another injury can  lead to permanent brain damage, brain swelling, or death. The risk of this is greatest during the first 7-10 days after a head injury. Avoid injuries by:  Wearing a seat belt when riding in a car.  Drinking alcohol only in moderation.  Wearing a helmet when biking, skiing, skateboarding, skating, or doing similar activities.  Avoiding activities that could lead to a second concussion, such as contact or recreational sports, until your health care provider says it is okay.  Taking safety measures in your home.  Remove clutter and tripping hazards from floors and stairways.  Use grab bars in bathrooms and handrails by stairs.  Place  non-slip mats on floors and in bathtubs.  Improve lighting in dim areas. SEEK MEDICAL CARE IF:  You have increased problems paying attention or concentrating.  You have increased difficulty remembering or learning new information.  You need more time to complete tasks or assignments than before.  You have increased irritability or decreased ability to cope with stress.  You have more symptoms than before. Seek medical care if you have any of the following symptoms for more than 2 weeks after your injury:  Lasting (chronic) headaches.  Dizziness or balance problems.  Nausea.  Vision problems.  Increased sensitivity to noise or light.  Depression or mood swings.  Anxiety or irritability.  Memory problems.  Difficulty concentrating or paying attention.  Sleep problems.  Feeling tired all the time. SEEK IMMEDIATE MEDICAL CARE IF:  You have severe or worsening headaches. These may be a sign of a blood clot in the brain.  You have weakness (even if only in one hand, leg, or part of the face).  You have numbness.  You have decreased coordination.  You vomit repeatedly.  You have increased sleepiness.  One pupil is larger than the other.  You have convulsions.  You have slurred speech.  You have increased confusion. This may be a sign of a blood clot in the brain.  You have increased restlessness, agitation, or irritability.  You are unable to recognize people or places.  You have neck pain.  It is difficult to wake you up.  You have unusual behavior changes.  You lose consciousness. MAKE SURE YOU:  Understand these instructions.  Will watch your condition.  Will get help right away if you are not doing well or get worse.   This information is not intended to replace advice given to you by your health care provider. Make sure you discuss any questions you have with your health care provider.   Document Released: 12/26/2003 Document Revised:  10/26/2014 Document Reviewed: 04/27/2013 Elsevier Interactive Patient Education 2016 [place], Adult A laceration is a cut that goes through all of the layers of the skin and into the tissue that is right under the skin. Some lacerations heal on their own. Others need to be closed with stitches (sutures), staples, skin adhesive strips, or skin glue. Proper laceration care minimizes the risk of infection and helps the laceration to heal better. HOW TO CARE FOR YOUR LACERATION If sutures or staples were used:  Keep the wound clean and dry.  If you were given a bandage (dressing), you should change it at least one time per day or as told by your health care provider. You should also change it if it becomes wet or dirty.  Keep the wound completely dry for the first 24 hours or as told by your health care provider. After that time, you may shower or  bathe. However, make sure that the wound is not soaked in water until after the sutures or staples have been removed.  Clean the wound one time each day or as told by your health care provider:  Wash the wound with soap and water.  Rinse the wound with water to remove all soap.  Pat the wound dry with a clean towel. Do not rub the wound.  After cleaning the wound, apply a thin layer of antibiotic ointmentas told by your health care provider. This will help to prevent infection and keep the dressing from sticking to the wound.  Have the sutures or staples removed as told by your health care provider. If skin adhesive strips were used:  Keep the wound clean and dry.  If you were given a bandage (dressing), you should change it at least one time per day or as told by your health care provider. You should also change it if it becomes dirty or wet.  Do not get the skin adhesive strips wet. You may shower or bathe, but be careful to keep the wound dry.  If the wound gets wet, pat it dry with a clean towel. Do not rub the  wound.  Skin adhesive strips fall off on their own. You may trim the strips as the wound heals. Do not remove skin adhesive strips that are still stuck to the wound. They will fall off in time. If skin glue was used:  Try to keep the wound dry, but you may briefly wet it in the shower or bath. Do not soak the wound in water, such as by swimming.  After you have showered or bathed, gently pat the wound dry with a clean towel. Do not rub the wound.  Do not do any activities that will make you sweat heavily until the skin glue has fallen off on its own.  Do not apply liquid, cream, or ointment medicine to the wound while the skin glue is in place. Using those may loosen the film before the wound has healed.  If you were given a bandage (dressing), you should change it at least one time per day or as told by your health care provider. You should also change it if it becomes dirty or wet.  If a dressing is placed over the wound, be careful not to apply tape directly over the skin glue. Doing that may cause the glue to be pulled off before the wound has healed.  Do not pick at the glue. The skin glue usually remains in place for 5-10 days, then it falls off of the skin. General Instructions  Take over-the-counter and prescription medicines only as told by your health care provider.  If you were prescribed an antibiotic medicine or ointment, take or apply it as told by your doctor. Do not stop using it even if your condition improves.  To help prevent scarring, make sure to cover your wound with sunscreen whenever you are outside after stitches are removed, after adhesive strips are removed, or when glue remains in place and the wound is healed. Make sure to wear a sunscreen of at least 30 SPF.  Do not scratch or pick at the wound.  Keep all follow-up visits as told by your health care provider. This is important.  Check your wound every day for signs of infection. Watch for:  Redness,  swelling, or pain.  Fluid, blood, or pus.  Raise (elevate) the injured area above the level of your heart while  you are sitting or lying down, if possible. SEEK MEDICAL CARE IF:  You received a tetanus shot and you have swelling, severe pain, redness, or bleeding at the injection site.  You have a fever.  A wound that was closed breaks open.  You notice a bad smell coming from your wound or your dressing.  You notice something coming out of the wound, such as wood or glass.  Your pain is not controlled with medicine.  You have increased redness, swelling, or pain at the site of your wound.  You have fluid, blood, or pus coming from your wound.  You notice a change in the color of your skin near your wound.  You need to change the dressing frequently due to fluid, blood, or pus draining from the wound.  You develop a new rash.  You develop numbness around the wound. SEEK IMMEDIATE MEDICAL CARE IF:  You develop severe swelling around the wound.  Your pain suddenly increases and is severe.  You develop painful lumps near the wound or on skin that is anywhere on your body.  You have a red streak going away from your wound.  The wound is on your hand or foot and you cannot properly move a finger or toe.  The wound is on your hand or foot and you notice that your fingers or toes look pale or bluish.   This information is not intended to replace advice given to you by your health care provider. Make sure you discuss any questions you have with your health care provider.   Document Released: 10/05/2005 Document Revised: 02/19/2015 Document Reviewed: 10/01/2014 Elsevier Interactive Patient Education [employer].
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
## 2016-08-04 ENCOUNTER — Encounter: Payer: Self-pay | Admitting: Pediatric Intensive Care

## 2016-08-04 DIAGNOSIS — Z136 Encounter for screening for cardiovascular disorders: Secondary | ICD-10-CM

## 2016-08-17 ENCOUNTER — Encounter: Payer: Self-pay | Admitting: Pediatric Intensive Care

## 2016-08-26 DIAGNOSIS — Z136 Encounter for screening for cardiovascular disorders: Secondary | ICD-10-CM | POA: Insufficient documentation

## 2016-08-26 NOTE — Congregational Nurse Program (Signed)
Congregational Nurse Program Note  Date of Encounter: 08/04/2016  Past Medical History: No past medical history on file.  Encounter Details:     CNP Questionnaire - 08/04/16 1109      Patient Demographics   Is this a new or existing patient? New   Patient is considered a/an Not Applicable   Race African-American/Black     Patient Assistance   Location of Patient Assistance [facility]   Patient's financial/insurance status Self-Pay (Uninsured)   Uninsured Patient ([facility] Oncologist) Yes   Interventions Not Applicable   Patient referred to apply for the following financial assistance Medicaid   Food insecurities addressed Not Applicable   Transportation assistance No   Assistance securing medications No   Educational health offerings Hypertension     Encounter Details   Primary purpose of visit Education/Health Concerns   Was an Emergency Department visit averted? Not Applicable   Does patient have a medical provider? No   Patient referred to Other   Was a mental health screening completed? (GAINS tool) No   Does patient have dental issues? No   Does patient have vision issues? No   Does your patient have an abnormal blood pressure today? Yes   Since previous encounter, have you referred patient for abnormal blood pressure that resulted in a new diagnosis or medication change? No   Does your patient have an abnormal blood glucose today? No   Since previous encounter, have you referred patient for abnormal blood glucose that resulted in a new diagnosis or medication change? No   Was there a life-saving intervention made? No     Initial screening with CN. Client reports family history of hypertension but states that he does not have HTN but would like a BP check. BP elevated. Recommend client follow up with CN for re-check. Client denies headache, vision changes.

## 2016-08-26 NOTE — Congregational Nurse Program (Signed)
Congregational Nurse Program Note  Date of Encounter: 08/17/2016  Past Medical History: No past medical history on file.  Encounter Details:     CNP Questionnaire - 08/17/16 1125      Patient Demographics   Is this a new or existing patient? Existing   Patient is considered a/an Not Applicable     Patient Assistance   Location of Patient Assistance [facility]   Patient's financial/insurance status Self-Pay (Uninsured)   Uninsured Patient ([facility] Oncologist) Yes   Interventions Not Applicable   Patient referred to apply for the following financial assistance Medicaid   Food insecurities addressed Not Applicable   Transportation assistance No   Assistance securing medications No   Educational health offerings Hypertension     Encounter Details   Primary purpose of visit Education/Health Concerns   Was an Emergency Department visit averted? Not Applicable   Does patient have a medical provider? No   Patient referred to Not Applicable   Was a mental health screening completed? (GAINS tool) No   Does patient have dental issues? No   Does patient have vision issues? No   Does your patient have an abnormal blood pressure today? No   Since previous encounter, have you referred patient for abnormal blood pressure that resulted in a new diagnosis or medication change? No   Does your patient have an abnormal blood glucose today? No   Since previous encounter, have you referred patient for abnormal blood glucose that resulted in a new diagnosis or medication change? No   Was there a life-saving intervention made? No     CN follow up for BP. Advised client to follow up in CN clinic as needed.

## 2016-09-14 ENCOUNTER — Encounter: Payer: Self-pay | Admitting: Pediatric Intensive Care

## 2016-09-26 NOTE — Congregational Nurse Program (Signed)
Congregational Nurse Program Note  Date of Encounter: 09/14/2016  Past Medical History: No past medical history on file.  Encounter Details:     CNP Questionnaire - 09/26/16 [id number]      Patient Demographics   Is this a new or existing patient? Existing   Patient is considered a/an Not Applicable   Race African-American/Black     Patient Assistance   Location of Patient Assistance [facility]   Patient's financial/insurance status Self-Pay (Uninsured)   Uninsured Patient ([facility] Oncologist) Yes   Interventions Not Applicable   Patient referred to apply for the following financial assistance Medicaid   Food insecurities addressed Not Applicable   Transportation assistance No   Assistance securing medications No   Educational health offerings Hypertension;Navigating the healthcare system     Encounter Details   Primary purpose of visit Navigating the Healthcare System;Education/Health Concerns   Was an Emergency Department visit averted? Not Applicable   Does patient have a medical provider? No   Patient referred to Not Applicable   Was a mental health screening completed? (GAINS tool) No   Does patient have dental issues? No   Does patient have vision issues? No   Does your patient have an abnormal blood pressure today? No   Since previous encounter, have you referred patient for abnormal blood pressure that resulted in a new diagnosis or medication change? No   Does your patient have an abnormal blood glucose today? No   Since previous encounter, have you referred patient for abnormal blood glucose that resulted in a new diagnosis or medication change? No   Was there a life-saving intervention made? No     BP check

## 2016-10-16 ENCOUNTER — Encounter: Payer: Self-pay | Admitting: Pediatric Intensive Care

## 2016-10-22 NOTE — Congregational Nurse Program (Signed)
Congregational Nurse Program Note  Date of Encounter: 10/16/2016  Past Medical History: No past medical history on file.  Encounter Details:     CNP Questionnaire - 10/16/16 0930      Patient Demographics   Is this a new or existing patient? Existing   Patient is considered a/an Not Applicable   Race African-American/Black     Patient Assistance   Location of Patient Assistance [facility]   Patient's financial/insurance status Self-Pay (Uninsured)   Uninsured Patient ([facility] Oncologist) Yes   Interventions Not Applicable   Patient referred to apply for the following financial assistance Not Applicable   Food insecurities addressed Not Applicable   Transportation assistance No   Assistance securing medications No   Educational health offerings Hypertension;Navigating the healthcare system     Encounter Details   Primary purpose of visit Other;Education/Health Concerns   Was an Emergency Department visit averted? Not Applicable   Does patient have a medical provider? No   Patient referred to Not Applicable   Was a mental health screening completed? (GAINS tool) No   Does patient have dental issues? No   Does patient have vision issues? No   Does your patient have an abnormal blood pressure today? No   Since previous encounter, have you referred patient for abnormal blood pressure that resulted in a new diagnosis or medication change? No   Does your patient have an abnormal blood glucose today? No   Since previous encounter, have you referred patient for abnormal blood glucose that resulted in a new diagnosis or medication change? No   Was there a life-saving intervention made? No     BP check

## 2017-03-06 ENCOUNTER — Emergency Department (HOSPITAL_COMMUNITY): Payer: Self-pay

## 2017-03-06 ENCOUNTER — Encounter (HOSPITAL_COMMUNITY): Payer: Self-pay | Admitting: Oncology

## 2017-03-06 ENCOUNTER — Emergency Department (HOSPITAL_COMMUNITY)
Admission: EM | Admit: 2017-03-06 | Discharge: 2017-03-07 | Disposition: A | Discharge status: Home / Self Care | Payer: Self-pay | Attending: Emergency Medicine | Admitting: Emergency Medicine

## 2017-03-06 DIAGNOSIS — R52 Pain, unspecified: Secondary | ICD-10-CM

## 2017-03-06 DIAGNOSIS — R109 Unspecified abdominal pain: Secondary | ICD-10-CM | POA: Insufficient documentation

## 2017-03-06 DIAGNOSIS — Z79899 Other long term (current) drug therapy: Secondary | ICD-10-CM | POA: Insufficient documentation

## 2017-03-06 DIAGNOSIS — N5082 Scrotal pain: Secondary | ICD-10-CM | POA: Insufficient documentation

## 2017-03-06 LAB — URINALYSIS, ROUTINE W REFLEX MICROSCOPIC
Bacteria, UA: NONE SEEN
Bilirubin Urine: NEGATIVE
Glucose, UA: NEGATIVE mg/dL
Hgb urine dipstick: NEGATIVE
KETONES UR: 5 mg/dL — AB
Nitrite: NEGATIVE
PH: 5 (ref 5.0–8.0)
PROTEIN: NEGATIVE mg/dL
SPECIFIC GRAVITY, URINE: 1.023 (ref 1.005–1.030)

## 2017-03-06 LAB — I-STAT CHEM 8, ED
BUN: 11 mg/dL (ref 6–20)
Calcium, Ion: 1.16 mmol/L (ref 1.15–1.40)
Chloride: 109 mmol/L (ref 101–111)
Creatinine, Ser: 1 mg/dL (ref 0.61–1.24)
GLUCOSE: 88 mg/dL (ref 65–99)
HCT: 42 % (ref 39.0–52.0)
HEMOGLOBIN: 14.3 g/dL (ref 13.0–17.0)
POTASSIUM: 3.6 mmol/L (ref 3.5–5.1)
SODIUM: 141 mmol/L (ref 135–145)
TCO2: 25 mmol/L (ref 0–100)

## 2017-03-06 LAB — CBC WITH DIFFERENTIAL/PLATELET
BASOS ABS: 0 10*3/uL (ref 0.0–0.1)
Basophils Relative: 0 %
EOS PCT: 1 %
Eosinophils Absolute: 0.1 10*3/uL (ref 0.0–0.7)
HCT: 39.9 % (ref 39.0–52.0)
Hemoglobin: 13 g/dL (ref 13.0–17.0)
LYMPHS ABS: 2.8 10*3/uL (ref 0.7–4.0)
LYMPHS PCT: 42 %
MCH: 30.1 pg (ref 26.0–34.0)
MCHC: 32.6 g/dL (ref 30.0–36.0)
MCV: 92.4 fL (ref 78.0–100.0)
MONO ABS: 0.4 10*3/uL (ref 0.1–1.0)
Monocytes Relative: 5 %
Neutro Abs: 3.4 10*3/uL (ref 1.7–7.7)
Neutrophils Relative %: 52 %
PLATELETS: 249 10*3/uL (ref 150–400)
RBC: 4.32 MIL/uL (ref 4.22–5.81)
RDW: 12.6 % (ref 11.5–15.5)
WBC: 6.7 10*3/uL (ref 4.0–10.5)

## 2017-03-06 LAB — BASIC METABOLIC PANEL
Anion gap: 11 (ref 5–15)
BUN: 10 mg/dL (ref 6–20)
CO2: 22 mmol/L (ref 22–32)
Calcium: 9.2 mg/dL (ref 8.9–10.3)
Chloride: 108 mmol/L (ref 101–111)
Creatinine, Ser: 0.96 mg/dL (ref 0.61–1.24)
GFR calc Af Amer: >60 mL/min (ref >60)
GLUCOSE: 90 mg/dL (ref 65–99)
POTASSIUM: 3.6 mmol/L (ref 3.5–5.1)
Sodium: 141 mmol/L (ref 135–145)

## 2017-03-06 MED ORDER — SODIUM CHLORIDE 0.9 % IV BOLUS (SEPSIS)
1000.0000 mL | Freq: Once | INTRAVENOUS | Status: AC
  Administered 2017-03-06: 1000 mL via INTRAVENOUS

## 2017-03-06 MED ORDER — ONDANSETRON HCL 4 MG/2ML IJ SOLN
4.0000 mg | Freq: Once | INTRAMUSCULAR | Status: AC
  Administered 2017-03-06: 4 mg via INTRAVENOUS
  Filled 2017-03-06: qty 2

## 2017-03-06 MED ORDER — MORPHINE SULFATE (PF) 4 MG/ML IV SOLN
4.0000 mg | Freq: Once | INTRAVENOUS | Status: AC
  Administered 2017-03-06: 4 mg via INTRAVENOUS
  Filled 2017-03-06: qty 1

## 2017-03-06 NOTE — ED Notes (Signed)
US in room 

## 2017-03-06 NOTE — ED Triage Notes (Signed)
Pt c/o right sided flank pain that radiates down right groin into right testicle.   Pt rates pain 8/10, aching in nature.  Pt states that the pain improves when he bends over.

## 2017-03-06 NOTE — ED Provider Notes (Signed)
[facility] DEPT Provider Note   CSN: [id number] Arrival date & time: 03/06/17  2013     History   Chief Complaint Chief Complaint  Patient presents with  . Flank Pain  . Testicle Pain    HPI MONTIE SWIDERSKI is a 61 y.o. male.  Pt presents to the ED with right testicle pain.  The pt said that his pain is on the right flank and radiates into his testicle.  Pt denies any trauma.  Pt denies any f/c or n/v.      History reviewed. No pertinent past medical history.  Patient Active Problem List   Diagnosis Date Noted  . Screening for hypertension 08/26/2016    History reviewed. No pertinent surgical history.     Home Medications    Prior to Admission medications   Medication Sig Start Date End Date Taking? Authorizing Provider  ibuprofen (ADVIL,MOTRIN) 200 MG tablet Take 200-400 mg by mouth every 6 (six) hours as needed for moderate pain.   Yes [provider]  bacitracin ointment Apply 1 application topically 2 (two) times daily. Patient not taking: Reported on 03/06/2017 04/07/16   Waynetta Pean, PA-C  loratadine (CLARITIN) 10 MG tablet Take 1 tablet (10 mg total) by mouth daily. 09/14/11 09/13/12  Abran Richard, PA-C  naproxen (NAPROSYN) 250 MG tablet Take 1 tablet (250 mg total) by mouth 2 (two) times daily with a meal. Patient not taking: Reported on 03/06/2017 04/07/16   Waynetta Pean, PA-C    Family History No family history on file.  Social History Social History  Substance Use Topics  . Smoking status: Never Smoker  . Smokeless tobacco: Current User    Types: Snuff  . Alcohol use Yes     Allergies   Patient has no known allergies.   Review of Systems Review of Systems  Genitourinary: Positive for testicular pain.  Musculoskeletal: Positive for back pain.  All other systems reviewed and are negative.    Physical Exam Updated Vital Signs BP (!) 138/91 (BP Location: Right Arm)   Pulse 72   Temp 98 F (36.7 C) (Oral)    Resp 18   Ht 5\' 6"  (1.676 m)   Wt 150 lb (68 kg)   SpO2 99%   BMI 24.21 kg/m   Physical Exam  Constitutional: He is oriented to person, place, and time. He appears well-developed and well-nourished.  HENT:  Head: Normocephalic and atraumatic.  Right Ear: External ear normal.  Left Ear: External ear normal.  Nose: Nose normal.  Mouth/Throat: Oropharynx is clear and moist.  Eyes: Conjunctivae and EOM are normal. Pupils are equal, round, and reactive to light.  Neck: Normal range of motion. Neck supple.  Cardiovascular: Normal rate, regular rhythm, normal heart sounds and intact distal pulses.   Pulmonary/Chest: Effort normal and breath sounds normal.  Abdominal: Soft. Bowel sounds are normal.  Genitourinary: Penis normal. Rectal exam shows tenderness. Right testis shows tenderness.  Neurological: He is alert and oriented to person, place, and time.  Skin: Skin is warm.  Psychiatric: He has a normal mood and affect.  Nursing note and vitals reviewed.    ED Treatments / Results  Labs (all labs ordered are listed, but only abnormal results are displayed) Labs Reviewed  URINALYSIS, ROUTINE W REFLEX MICROSCOPIC - Abnormal; Notable for the following:       Result Value   Ketones, ur 5 (*)    Leukocytes, UA TRACE (*)    Squamous Epithelial / LPF 0-5 (*)  All other components within normal limits  BASIC METABOLIC PANEL  CBC WITH DIFFERENTIAL/PLATELET  I-STAT CHEM 8, ED    EKG  EKG Interpretation None       Radiology Ct Renal Stone Study  Result Date: 03/06/2017 CLINICAL DATA:  Right testicular pain EXAM: CT ABDOMEN AND PELVIS WITHOUT CONTRAST TECHNIQUE: Multidetector CT imaging of the abdomen and pelvis was performed following the standard protocol without IV contrast. COMPARISON:  03/20/2004 FINDINGS: Lower chest: Lung bases are clear. Hepatobiliary: Unenhanced liver is unremarkable. Gallbladder is unremarkable. No intrahepatic or extrahepatic ductal dilatation.  Pancreas: Within normal limits. Spleen: Within normal limits. Adrenals/Urinary Tract: Adrenal glands are within normal limits. Kidneys are within normal limits. No renal, ureteral, or bladder calculi. No hydronephrosis. Bladder is within normal limits. Stomach/Bowel: Stomach is within normal limits. No evidence of bowel obstruction. Normal appendix (series 2/ image 47). Vascular/Lymphatic: No evidence of abdominal aortic aneurysm. No suspicious abdominopelvic lymphadenopathy. Reproductive: Prostatomegaly. Other: No abdominopelvic ascites. Musculoskeletal: Degenerative changes of the visualized thoracolumbar spine. IMPRESSION: No renal, ureteral, or bladder calculi.  No hydronephrosis. No evidence of bowel obstruction.  Normal appendix. No CT findings to account for the patient's right flank pain. Electronically Signed   By: Julian Hy M.D.   On: 03/06/2017 22:45    Procedures Procedures (including critical care time)  Medications Ordered in ED Medications  sodium chloride 0.9 % bolus 1,000 mL (1,000 mLs Intravenous New Bag/Given 03/06/17 2151)  morphine 4 MG/ML injection 4 mg (4 mg Intravenous Given 03/06/17 2152)  ondansetron (ZOFRAN) injection 4 mg (4 mg Intravenous Given 03/06/17 2151)     Initial Impression / Assessment and Plan / ED Course  I have reviewed the triage vital signs and the nursing notes.  Pertinent labs & imaging results that were available during my care of the patient were reviewed by me and considered in my medical decision making (see chart for details).    Labs and US scrotum pending.  Pt signed out to Dr. Christy Gentles pending result of ultrasound.  Final Clinical Impressions(s) / ED Diagnoses   Final diagnoses:  Pain   Right testicle pain New Prescriptions New Prescriptions   No medications on file     Isla Pence, MD 03/07/17 1649

## 2017-03-07 MED ORDER — HYDROCODONE-ACETAMINOPHEN 5-325 MG PO TABS: 1.0000 | ORAL_TABLET | Freq: Four times a day (QID) | ORAL | 0 refills | Status: DC | PRN

## 2017-03-07 NOTE — Discharge Instructions (Signed)

## 2017-03-07 NOTE — ED Provider Notes (Signed)
I assumed care at signout to f/u on imaging No acute findings by CT/US imaging Pt awake/alert, feels improved He reports some back/flank pain that radiates into right thigh Distal pulses intact No focal weakness Could be radiculopathy Will d/c home He requests pain meds Referred to urology if scrotal pain continues Narcotic database reviewed and considered in decision making    Ripley Fraise, MD 03/07/17 0106

## 2020-06-28 ENCOUNTER — Encounter (HOSPITAL_COMMUNITY): Payer: Self-pay

## 2020-06-28 ENCOUNTER — Other Ambulatory Visit: Payer: Self-pay

## 2020-06-28 DIAGNOSIS — Z79899 Other long term (current) drug therapy: Secondary | ICD-10-CM | POA: Diagnosis not present

## 2020-06-28 DIAGNOSIS — R112 Nausea with vomiting, unspecified: Secondary | ICD-10-CM | POA: Diagnosis present

## 2020-06-28 DIAGNOSIS — Z23 Encounter for immunization: Secondary | ICD-10-CM | POA: Insufficient documentation

## 2020-06-28 DIAGNOSIS — U071 COVID-19: Secondary | ICD-10-CM | POA: Diagnosis not present

## 2020-06-28 NOTE — ED Triage Notes (Signed)
Pt reports vomiting, cramps, and SOB since last Sunday. States that he feels dehydrated because he hasn't been able to keep anything down. Also reports that he drove some people to get tested for COVID and they came back positive. Pt states is fully vaccinated. No fevers, congestion, or cough.

## 2020-06-29 ENCOUNTER — Emergency Department (HOSPITAL_COMMUNITY)
Admission: EM | Admit: 2020-06-29 | Discharge: 2020-06-29 | Disposition: A | Discharge status: Home / Self Care | Payer: Medicare Other | Attending: Emergency Medicine | Admitting: Emergency Medicine

## 2020-06-29 DIAGNOSIS — U071 COVID-19: Secondary | ICD-10-CM | POA: Diagnosis not present

## 2020-06-29 DIAGNOSIS — R112 Nausea with vomiting, unspecified: Secondary | ICD-10-CM

## 2020-06-29 DIAGNOSIS — Z23 Encounter for immunization: Secondary | ICD-10-CM | POA: Diagnosis not present

## 2020-06-29 LAB — CBC
HCT: 42 % (ref 39.0–52.0)
Hemoglobin: 13.8 g/dL (ref 13.0–17.0)
MCH: 30.8 pg (ref 26.0–34.0)
MCHC: 32.9 g/dL (ref 30.0–36.0)
MCV: 93.8 fL (ref 80.0–100.0)
Platelets: 241 10*3/uL (ref 150–400)
RBC: 4.48 MIL/uL (ref 4.22–5.81)
RDW: 12 % (ref 11.5–15.5)
WBC: 7.7 10*3/uL (ref 4.0–10.5)
nRBC: 0 % (ref 0.0–0.2)

## 2020-06-29 LAB — COMPREHENSIVE METABOLIC PANEL
ALT: 26 U/L (ref 0–44)
AST: 29 U/L (ref 15–41)
Albumin: 3.8 g/dL (ref 3.5–5.0)
Alkaline Phosphatase: 46 U/L (ref 38–126)
Anion gap: 14 (ref 5–15)
BUN: 15 mg/dL (ref 8–23)
CO2: 23 mmol/L (ref 22–32)
Calcium: 9.2 mg/dL (ref 8.9–10.3)
Chloride: 98 mmol/L (ref 98–111)
Creatinine, Ser: 1.19 mg/dL (ref 0.61–1.24)
GFR calc Af Amer: >60 mL/min (ref >60)
GFR calc non Af Amer: >60 mL/min (ref >60)
Glucose, Bld: 107 mg/dL — ABNORMAL HIGH (ref 70–99)
Potassium: 4.5 mmol/L (ref 3.5–5.1)
Sodium: 135 mmol/L (ref 135–145)
Total Bilirubin: 0.9 mg/dL (ref 0.3–1.2)
Total Protein: 7.8 g/dL (ref 6.5–8.1)

## 2020-06-29 LAB — URINALYSIS, ROUTINE W REFLEX MICROSCOPIC
Bilirubin Urine: NEGATIVE
Glucose, UA: NEGATIVE mg/dL
Hgb urine dipstick: NEGATIVE
Ketones, ur: NEGATIVE mg/dL
Leukocytes,Ua: NEGATIVE
Nitrite: NEGATIVE
Protein, ur: NEGATIVE mg/dL
Specific Gravity, Urine: 1.012 (ref 1.005–1.030)
pH: 5 (ref 5.0–8.0)

## 2020-06-29 LAB — LIPASE, BLOOD: Lipase: 37 U/L (ref 11–51)

## 2020-06-29 LAB — SARS CORONAVIRUS 2 BY RT PCR (HOSPITAL ORDER, PERFORMED IN ~~LOC~~ HOSPITAL LAB): SARS Coronavirus 2: POSITIVE — AB

## 2020-06-29 MED ORDER — FAMOTIDINE IN NACL 20-0.9 MG/50ML-% IV SOLN: 20.0000 mg | Freq: Once | INTRAVENOUS | Status: DC | PRN

## 2020-06-29 MED ORDER — SODIUM CHLORIDE 0.9 % IV SOLN: INTRAVENOUS | Status: DC | PRN

## 2020-06-29 MED ORDER — DIPHENHYDRAMINE HCL 50 MG/ML IJ SOLN: 50.0000 mg | Freq: Once | INTRAMUSCULAR | Status: DC | PRN

## 2020-06-29 MED ORDER — ONDANSETRON HCL 4 MG/2ML IJ SOLN: 4.0000 mg | Freq: Once | INTRAMUSCULAR | Status: DC

## 2020-06-29 MED ORDER — ONDANSETRON HCL 4 MG/2ML IJ SOLN
4.0000 mg | Freq: Once | INTRAMUSCULAR | Status: AC
  Administered 2020-06-29: 4 mg via INTRAVENOUS
  Filled 2020-06-29: qty 2

## 2020-06-29 MED ORDER — ONDANSETRON 4 MG PO TBDP: 4.0000 mg | ORAL_TABLET | Freq: Three times a day (TID) | ORAL | 0 refills | Status: DC | PRN

## 2020-06-29 MED ORDER — SODIUM CHLORIDE 0.9 % IV BOLUS
1000.0000 mL | Freq: Once | INTRAVENOUS | Status: AC
  Administered 2020-06-29: 1000 mL via INTRAVENOUS

## 2020-06-29 MED ORDER — SODIUM CHLORIDE 0.9 % IV SOLN
1200.0000 mg | Freq: Once | INTRAVENOUS | Status: AC
  Administered 2020-06-29: 1200 mg via INTRAVENOUS
  Filled 2020-06-29: qty 10

## 2020-06-29 MED ORDER — ALBUTEROL SULFATE HFA 108 (90 BASE) MCG/ACT IN AERS: 2.0000 | INHALATION_SPRAY | Freq: Once | RESPIRATORY_TRACT | Status: DC | PRN

## 2020-06-29 MED ORDER — METHYLPREDNISOLONE SODIUM SUCC 125 MG IJ SOLR: 125.0000 mg | Freq: Once | INTRAMUSCULAR | Status: DC | PRN

## 2020-06-29 MED ORDER — EPINEPHRINE 0.3 MG/0.3ML IJ SOAJ: 0.3000 mg | Freq: Once | INTRAMUSCULAR | Status: DC | PRN

## 2020-06-29 NOTE — ED Notes (Signed)
Discharge paperwork reviewed with pt, including prescription.  Pt provided work note, per his request.  Pt ambulatory at discharge to ED exit, on RA. NAD>

## 2020-06-29 NOTE — ED Notes (Signed)
Date and time results received: 06/29/20 0321 (use smartphrase ".now" to insert current time)  Test: covid Critical Value: positive  Name of Provider Notified: Antonietta Breach  Orders Received? Or Actions Taken?: Actions Taken: provider notified

## 2020-06-29 NOTE — ED Notes (Signed)
ED Provider at bedside. 

## 2020-06-29 NOTE — Discharge Instructions (Signed)
Take tylenol for fever, headaches, body aches. Drink plenty of fluids to prevent dehydration. Use Zofran to manage any persistent nausea. You may continue to use other over-the-counter remedies for symptom control, if desired. Return for new or concerning symptoms such as worsening shortness of breath, coughing up blood, persistent vomiting, loss of consciousness.

## 2020-06-29 NOTE — ED Provider Notes (Signed)
[facility] DEPT Provider Note   CSN: [id number] Arrival date & time: 06/28/20  1928     History Chief Complaint  Patient presents with  . Emesis    Phillip Brooks is a 64 y.o. male.   64 year old male presents to the emergency department for evaluation of nausea and vomiting.  Symptoms began 5 days ago.  Has had difficulty tolerating food and fluids secondary to persistent vomiting.  He denies any blood in his emesis.  Symptoms associated with myalgias and muscle cramps.  Denies taking any medications for symptoms as well as any associated fevers, congestion, cough, SOB, abdominal pain, diarrhea, melena or hematochezia.  He is vaccinated for COVID-19, but drove some people to get tested and they came back positive.  The history is provided by the patient. No language interpreter was used.  Emesis     History reviewed. No pertinent past medical history.  Patient Active Problem List   Diagnosis Date Noted  . Screening for hypertension 08/26/2016    History reviewed. No pertinent surgical history.     History reviewed. No pertinent family history.  Social History   Tobacco Use  . Smoking status: Never Smoker  . Smokeless tobacco: Current User    Types: Snuff  Vaping Use  . Vaping Use: Never used  Substance Use Topics  . Alcohol use: Yes  . Drug use: No    Home Medications Prior to Admission medications   Medication Sig Start Date End Date Taking? Authorizing Provider  Acetaminophen (TYLENOL 8 HOUR PO) Take 800 mg by mouth daily at 6 (six) AM.   Yes [provider]  fluticasone (FLONASE) 50 MCG/ACT nasal spray Place 2 sprays into both nostrils daily.   Yes [provider]  HYDROcodone-acetaminophen (NORCO/VICODIN) 5-325 MG tablet Take 1 tablet by mouth every 6 (six) hours as needed for moderate pain. 03/07/17   Ripley Fraise, MD  ibuprofen (ADVIL,MOTRIN) 200 MG tablet Take 200-400 mg by mouth every 6 (six) hours as  needed for moderate pain.    [provider]  loratadine (CLARITIN) 10 MG tablet Take 1 tablet (10 mg total) by mouth daily. 09/14/11 09/13/12  Abran Richard, PA-C    Allergies    Patient has no known allergies.  Review of Systems   Review of Systems  Gastrointestinal: Positive for vomiting.  Ten systems reviewed and are negative for acute change, except as noted in the HPI.    Physical Exam Updated Vital Signs BP 105/77   Pulse 70   Temp 98.3 F (36.8 C) (Oral)   Resp 20   Ht 5\' 6"  (1.676 m)   Wt 71.2 kg   SpO2 99%   BMI 25.34 kg/m   Physical Exam Vitals and nursing note reviewed.  Constitutional:      General: He is not in acute distress.    Appearance: He is well-developed. He is not diaphoretic.     Comments: Nontoxic appearing and in NAD  HENT:     Head: Normocephalic and atraumatic.  Eyes:     General: No scleral icterus.    Conjunctiva/sclera: Conjunctivae normal.  Cardiovascular:     Rate and Rhythm: Normal rate and regular rhythm.     Pulses: Normal pulses.  Pulmonary:     Effort: Pulmonary effort is normal. No respiratory distress.     Breath sounds: No stridor. No wheezing.     Comments: Respirations even and unlabored Abdominal:     Palpations: Abdomen is soft.  Tenderness: There is no abdominal tenderness.     Comments: Nondistended, nontender.  Musculoskeletal:        General: Normal range of motion.     Cervical back: Normal range of motion.  Skin:    General: Skin is warm and dry.     Coloration: Skin is not pale.     Findings: No erythema or rash.  Neurological:     Mental Status: He is alert and oriented to person, place, and time.     Coordination: Coordination normal.  Psychiatric:        Behavior: Behavior normal.     ED Results / Procedures / Treatments   Labs (all labs ordered are listed, but only abnormal results are displayed) Labs Reviewed  SARS CORONAVIRUS 2 BY RT PCR (HOSPITAL ORDER, [facility]) - Abnormal; Notable for the following components:      Result Value   SARS Coronavirus 2 POSITIVE (*)    All other components within normal limits  COMPREHENSIVE METABOLIC PANEL - Abnormal; Notable for the following components:   Glucose, Bld 107 (*)    All other components within normal limits  LIPASE, BLOOD  CBC  URINALYSIS, ROUTINE W REFLEX MICROSCOPIC    EKG EKG Interpretation  Date/Time:  Friday June 28 2020 20:52:04 EDT Ventricular Rate:  96 PR Interval:    QRS Duration: 80 QT Interval:  326 QTC Calculation: 412 R Axis:   79 Text Interpretation: Sinus rhythm Left ventricular hypertrophy Anterior ST elevation, probably due to LVH 12 Lead; [place] similar to previous Confirmed by Theotis Burrow [phone]) on 06/29/2020 2:34:51 AM   Radiology No results found.  Procedures Procedures (including critical care time)  Medications Ordered in ED Medications  casirivimab-imdevimab (REGEN-COV) 1,200 mg in sodium chloride 0.9 % 110 mL IVPB (has no administration in time range)  0.9 %  sodium chloride infusion (has no administration in time range)  diphenhydrAMINE (BENADRYL) injection 50 mg (has no administration in time range)  famotidine (PEPCID) IVPB 20 mg premix (has no administration in time range)  methylPREDNISolone sodium succinate (SOLU-MEDROL) 125 mg/2 mL injection 125 mg (has no administration in time range)  albuterol (VENTOLIN HFA) 108 (90 Base) MCG/ACT inhaler 2 puff (has no administration in time range)  EPINEPHrine (EPI-PEN) injection 0.3 mg (has no administration in time range)  ondansetron (ZOFRAN) injection 4 mg (4 mg Intravenous Given 06/29/20 0157)  sodium chloride 0.9 % bolus 1,000 mL (0 mLs Intravenous Stopped 06/29/20 0258)    ED Course  I have reviewed the triage vital signs and the nursing notes.  Pertinent labs & imaging results that were available during my care of the patient were reviewed by me and considered in my medical  decision making (see chart for details).  Clinical Course as of Jun 29 500  Sat Jun 29, 2020  0500 Tolerating crackers and Sprite after IV fluids and Zofran.  Covid test returned positive today.  Have discussed monoclonal antibody infusion and patient amenable.  Anticipate discharge when infusion complete.   [KH]    Clinical Course User Index [KH] Antonietta Breach, PA-C   MDM Rules/Calculators/A&P                          64 year old male presenting for body aches as well as muscle cramping with nausea and vomiting.  Has been vaccinated for COVID-19, but tested positive for the virus today.  Has known exposure approximately 1 week ago.  Vitals are stable without hypoxia, shortness of breath, chest pain.  Does meet criteria for monoclonal antibody infusion given age.  Tolerating oral food and fluids following Zofran.    Care signed out to Green, PA-C at shift change.  Anticipate discharge when monoclonal antibody infusion complete.  Return precautions given.  Phillip Brooks was evaluated in Emergency Department on 06/29/2020 for the symptoms described in the history of present illness. He was evaluated in the context of the global COVID-19 pandemic, which necessitated consideration that the patient might be at risk for infection with the SARS-CoV-2 virus that causes COVID-19. Institutional protocols and algorithms that pertain to the evaluation of patients at risk for COVID-19 are in a state of rapid change based on information released by regulatory bodies including the CDC and federal and state organizations. These policies and algorithms were followed during the patient's care in the ED.   Final Clinical Impression(s) / ED Diagnoses Final diagnoses:  KGOVP-03 virus infection  Non-intractable vomiting with nausea, unspecified vomiting type    Rx / DC Orders ED Discharge Orders    None       Antonietta Breach, PA-C 06/29/20 Spink, Wenda Overland, MD 06/30/20 [phone]

## 2020-06-29 NOTE — ED Provider Notes (Signed)
Received patient as a handoff at shift change from [facility], [place].  Patient without any significant past medical history presented to the ED with complaints of nausea and vomiting in addition to body aches.  He is fully immunized against COVID-19, tested positive here in the ED today.  He is without any shortness of breath, increased work of breathing, or chest discomfort.  Given his age, determination was made for monoclonal antibody infusion.  I spoke with handoff provider and discharge papers have already been prepared assuming there is no complication or adverse reaction to the infusion.  Patient's vital signs have been stable and WNL while in the ER.    On my brief exam, patient is concerned because he still feels mildly nauseated.  He states that he had not eaten well for nearly a week.  No emesis since he came to the ER 12 hours ago.  Will p.o. challenge prior to discharge.  Patient was able to eat half of a sandwich.  He states that the bread tastes dry and is instead asking for breakfast.  Patient is safe for discharge.  Phillip Brooks was evaluated in Emergency Department on 06/29/2020 for the symptoms described in the history of present illness. He was evaluated in the context of the global COVID-19 pandemic, which necessitated consideration that the patient might be at risk for infection with the SARS-CoV-2 virus that causes COVID-19. Institutional protocols and algorithms that pertain to the evaluation of patients at risk for COVID-19 are in a state of rapid change based on information released by regulatory bodies including the CDC and federal and state organizations. These policies and algorithms were followed during the patient's care in the ED.    Corena Herter, PA-C 06/29/20 1030    Truddie Hidden, MD 06/29/20 [phone]

## 2020-10-04 ENCOUNTER — Observation Stay (HOSPITAL_COMMUNITY)
Admission: EM | Admit: 2020-10-04 | Discharge: 2020-10-06 | Disposition: A | Discharge status: Home / Self Care | Payer: Medicare Other | Attending: Internal Medicine | Admitting: Internal Medicine

## 2020-10-04 ENCOUNTER — Other Ambulatory Visit: Payer: Self-pay

## 2020-10-04 DIAGNOSIS — R710 Precipitous drop in hematocrit: Secondary | ICD-10-CM | POA: Diagnosis not present

## 2020-10-04 DIAGNOSIS — Z8616 Personal history of COVID-19: Secondary | ICD-10-CM | POA: Insufficient documentation

## 2020-10-04 DIAGNOSIS — K529 Noninfective gastroenteritis and colitis, unspecified: Secondary | ICD-10-CM | POA: Diagnosis present

## 2020-10-04 DIAGNOSIS — R300 Dysuria: Secondary | ICD-10-CM | POA: Diagnosis present

## 2020-10-04 DIAGNOSIS — Z20822 Contact with and (suspected) exposure to covid-19: Secondary | ICD-10-CM | POA: Insufficient documentation

## 2020-10-04 DIAGNOSIS — R197 Diarrhea, unspecified: Secondary | ICD-10-CM | POA: Diagnosis not present

## 2020-10-04 DIAGNOSIS — K921 Melena: Secondary | ICD-10-CM | POA: Insufficient documentation

## 2020-10-04 DIAGNOSIS — Z87891 Personal history of nicotine dependence: Secondary | ICD-10-CM | POA: Insufficient documentation

## 2020-10-04 DIAGNOSIS — R159 Full incontinence of feces: Secondary | ICD-10-CM | POA: Insufficient documentation

## 2020-10-04 DIAGNOSIS — K922 Gastrointestinal hemorrhage, unspecified: Principal | ICD-10-CM | POA: Insufficient documentation

## 2020-10-04 DIAGNOSIS — K5900 Constipation, unspecified: Secondary | ICD-10-CM

## 2020-10-04 LAB — URINALYSIS, ROUTINE W REFLEX MICROSCOPIC
Bilirubin Urine: NEGATIVE
Glucose, UA: NEGATIVE mg/dL
Hgb urine dipstick: NEGATIVE
Ketones, ur: NEGATIVE mg/dL
Leukocytes,Ua: NEGATIVE
Nitrite: NEGATIVE
Protein, ur: NEGATIVE mg/dL
Specific Gravity, Urine: 1.002 — ABNORMAL LOW (ref 1.005–1.030)
pH: 6 (ref 5.0–8.0)

## 2020-10-04 NOTE — ED Triage Notes (Signed)
Pt presents to ED POV. Pt c/o L abd pressure after urinating. Pt also reports urinary urgency.

## 2020-10-05 DIAGNOSIS — K921 Melena: Secondary | ICD-10-CM | POA: Diagnosis present

## 2020-10-05 LAB — CBC WITH DIFFERENTIAL/PLATELET
Abs Immature Granulocytes: 0.02 10*3/uL (ref 0.00–0.07)
Basophils Absolute: 0.1 10*3/uL (ref 0.0–0.1)
Basophils Relative: 1 %
Eosinophils Absolute: 0.7 10*3/uL — ABNORMAL HIGH (ref 0.0–0.5)
Eosinophils Relative: 9 %
HCT: 37.4 % — ABNORMAL LOW (ref 39.0–52.0)
Hemoglobin: 11.7 g/dL — ABNORMAL LOW (ref 13.0–17.0)
Immature Granulocytes: 0 %
Lymphocytes Relative: 32 %
Lymphs Abs: 2.8 10*3/uL (ref 0.7–4.0)
MCH: 28.8 pg (ref 26.0–34.0)
MCHC: 31.3 g/dL (ref 30.0–36.0)
MCV: 92.1 fL (ref 80.0–100.0)
Monocytes Absolute: 0.8 10*3/uL (ref 0.1–1.0)
Monocytes Relative: 9 %
Neutro Abs: 4.3 10*3/uL (ref 1.7–7.7)
Neutrophils Relative %: 49 %
Platelets: 395 10*3/uL (ref 150–400)
RBC: 4.06 MIL/uL — ABNORMAL LOW (ref 4.22–5.81)
RDW: 13.3 % (ref 11.5–15.5)
WBC: 8.8 10*3/uL (ref 4.0–10.5)
nRBC: 0 % (ref 0.0–0.2)

## 2020-10-05 LAB — RESP PANEL BY RT-PCR (FLU A&B, COVID) ARPGX2
Influenza A by PCR: NEGATIVE
Influenza B by PCR: NEGATIVE
SARS Coronavirus 2 by RT PCR: NEGATIVE

## 2020-10-05 LAB — COMPREHENSIVE METABOLIC PANEL
ALT: 11 U/L (ref 0–44)
AST: 16 U/L (ref 15–41)
Albumin: 3 g/dL — ABNORMAL LOW (ref 3.5–5.0)
Alkaline Phosphatase: 61 U/L (ref 38–126)
Anion gap: 11 (ref 5–15)
BUN: 5 mg/dL — ABNORMAL LOW (ref 8–23)
CO2: 23 mmol/L (ref 22–32)
Calcium: 9 mg/dL (ref 8.9–10.3)
Chloride: 102 mmol/L (ref 98–111)
Creatinine, Ser: 0.96 mg/dL (ref 0.61–1.24)
GFR, Estimated: >60 mL/min (ref >60)
Glucose, Bld: 115 mg/dL — ABNORMAL HIGH (ref 70–99)
Potassium: 3.7 mmol/L (ref 3.5–5.1)
Sodium: 136 mmol/L (ref 135–145)
Total Bilirubin: 0.6 mg/dL (ref 0.3–1.2)
Total Protein: 7.4 g/dL (ref 6.5–8.1)

## 2020-10-05 LAB — C DIFFICILE (CDIFF) QUICK SCRN (NO PCR REFLEX)
C Diff antigen: NEGATIVE
C Diff interpretation: NOT DETECTED
C Diff toxin: NEGATIVE

## 2020-10-05 LAB — LIPASE, BLOOD: Lipase: 36 U/L (ref 11–51)

## 2020-10-05 LAB — C-REACTIVE PROTEIN: CRP: 4.5 mg/dL — ABNORMAL HIGH (ref <1.0)

## 2020-10-05 LAB — HIV ANTIBODY (ROUTINE TESTING W REFLEX): HIV Screen 4th Generation wRfx: NONREACTIVE

## 2020-10-05 LAB — LACTIC ACID, PLASMA: Lactic Acid, Venous: 1.1 mmol/L (ref 0.5–1.9)

## 2020-10-05 LAB — SEDIMENTATION RATE: Sed Rate: 52 mm/hr — ABNORMAL HIGH (ref 0–16)

## 2020-10-05 MED ORDER — ENOXAPARIN SODIUM 40 MG/0.4ML ~~LOC~~ SOLN
40.0000 mg | SUBCUTANEOUS | Status: DC
  Filled 2020-10-05: qty 0.4

## 2020-10-05 MED ORDER — ACETAMINOPHEN 325 MG PO TABS
650.0000 mg | ORAL_TABLET | Freq: Four times a day (QID) | ORAL | Status: DC | PRN
  Administered 2020-10-05: 650 mg via ORAL
  Filled 2020-10-05: qty 2

## 2020-10-05 NOTE — H&P (Signed)
Date: 10/05/2020               Patient Name:  Phillip Brooks MRN: [id number]  DOB: 12/04/1955 Age / Sex: 64 y.o., male   PCP: Patient, No Pcp Per         Medical Service: Internal Medicine Teaching Service         Attending Physician: Dr. Jimmye Norman, Elaina Pattee, MD    First Contact: Dr. Konrad Penta Pager: [phone]  Second Contact: Dr. Charleen Kirks Pager: [phone]       After Hours (After 5p/  First Contact Pager: [phone]  weekends / holidays): Second Contact Pager: [phone]   Chief Complaint: Diarrhea  History of Present Illness:  Phillip Brooks is a 64 y/o male with recent COVID-19 pneumonia in 06/2020, no other known PMHx, who presents to the ED with c/o diarrhea.   Phillip Brooks states that approximately 6 months ago, he began having fluctuating episodes of constipation and diarrhea with a relatively quick onset lack of appetite. This continued on until approximately 1 month ago, when he essentially progressed to only having diarrhea, numerous episodes throughout the day that are generally small volume. Since then, he has been experiencing urinary and bowel incontinence stating that he does not know when it is about to have been until he has a sudden urge and occasionally does not make it to the restroom. In those moments, he tends to have a small volume of what he thought was brown stool, but upon looking at a stool in the light, realized that it was black/coffee-ground like.  When the stool is not dark, he notes that it appears mucus-like.  Over the last 2 weeks, the symptoms have become significantly worse. Phillip Brooks also endorses a feeling of needing to use the bathroom but not having any output. When he sits in a chair, he feels a pressure in his lower abdomen and rectal area that is difficult for him to pinpoint. This is relieved by laying down or sitting on 1 side.  He otherwise denies any abdominal pain, nausea, vomiting, dizziness. He notes a dry cough since getting COVID-19 pneumonia  in September 2021, but denies any difficulty breathing or chest pain.  Phillip Brooks admits to daily alcohol use for many years now, stating he drinks at least a six-pack of beer per day. He used to drink [place] daily but switched to beer due to health concerns. He notes that some days he does wake up and immediately open a beer as this does make him feel better.  He has never considered himself as someone with alcohol use disorder though.   ED course: On arrival to the ED, patient's vitals were stable with blood pressure of 128/83, heart rate of 96, oxygen saturation at 100% and afebrile at 98.5. Initial lab work was largely unremarkable other than a drop in hemoglobin from 13.8 proximately 4 months ago to 11.7 toda y. The provider states he spoke with GI who recommended repeat CBC in the morning and to reconsult if any abnormalities are discovered. Given patient's difficulty with follow-up, ED provider has consulted IMTS for admission.  Meds:  Current Meds  Medication Sig  . Acetaminophen (TYLENOL 8 HOUR PO) Take 800 mg by mouth daily at 6 (six) AM.   Allergies: Allergies as of 10/04/2020  . (No Known Allergies)   Past Medical History:  COVID-19 Pneumonia  Family History:  Maternal grandmother and grandfather: Stroke, MI Sister: Passed from cancer 6 months ago, unknown type.  Social History:  Lives in [place], [place] since the 1970s when he came here from [place] to attend college for musical arts.   Daily alcohol intake, at least 4-5 beers at minimum. Occasionally drinks beer immediately upon wakening up.  No tobacco use.  Marijuana use on holidays. Used cocaine 25 years ago socially. Denies IV drug use.   Review of Systems: A complete ROS was negative except as per HPI.   Physical Exam: Blood pressure (!) 118/91, pulse 74, temperature 98.6 F (37 C), temperature source Oral, resp. rate 20, SpO2 100 %.  Physical Exam Vitals and nursing note reviewed. Exam conducted with a chaperone  present.  Constitutional:      General: He is not in acute distress.    Appearance: He is normal weight.  HENT:     Head: Normocephalic and atraumatic.     Mouth/Throat:     Mouth: Mucous membranes are moist.     Pharynx: Oropharynx is clear. No oropharyngeal exudate or posterior oropharyngeal erythema.  Cardiovascular:     Rate and Rhythm: Normal rate and regular rhythm.     Heart sounds: Murmur (2/6 systolic murmur heard best at LUSB) heard.  No gallop.   Pulmonary:     Effort: No respiratory distress.     Breath sounds: No wheezing or rales.  Abdominal:     General: Bowel sounds are normal. There is no distension.     Palpations: Abdomen is soft. There is no mass.     Tenderness: There is no abdominal tenderness. There is no right CVA tenderness, left CVA tenderness or guarding.     Hernia: No hernia is present.  Genitourinary:    Pubic Area: No rash.      Testes: Normal.        Right: Mass or tenderness not present.        Left: Mass or tenderness not present.     Rectum: No anal fissure or internal hemorrhoid.  Musculoskeletal:     Right lower leg: No edema.     Left lower leg: No edema.  Lymphadenopathy:     Lower Body: No right inguinal adenopathy. No left inguinal adenopathy.  Skin:    General: Skin is warm and dry.  Neurological:     General: No focal deficit present.     Mental Status: He is alert and oriented to person, place, and time. Mental status is at baseline.     Motor: No weakness.  Psychiatric:        Mood and Affect: Mood normal.        Behavior: Behavior normal.    Assessment & Plan by Problem: Active Problems:   Melena  Phillip Brooks is a 64 y/o male with no significant PMHx due to lack of medical follow up who presents with c/o diarrhea and admitted for evaluation of melena and worsening chronic diarrhea.   # Melena # Chronic Diarrhea # Bowel Incontinence  Patient's history of bowel movement changes with lack of appetite that progressed  to incontinence, tenesmus, melena is concerning.  Given patient's age, he is at risk for colorectal malignancy.  Differential also includes IBD, for which inflammatory markers were evaluated and found to be high.  To evaluate for both IBD and colorectal cancer, patient would benefit from a colonoscopy.  Also on the differential is infectious causes with a GI panel pending.  We will consider additional imaging including CT scan of the abdomen/pelvis versus barium enema to assess for inflammation or masses.  At this time, patient is hemodynamically stable.  - Consider GI re-consult in the AM - CBC in the AM - GI Panel pending - Fecal calprotectin pending   # Alcohol Abuse  - CIWA protocol  - Will need to address further once acute complaints addressed - CMP in the AM  Dispo: Admit patient to Observation with expected length of stay less than 2 midnights.  Signed: Dr. Jose Persia Internal Medicine PGY-2  Pager: [phone] After 5pm on weekdays and 1pm on weekends: On Call pager [phone]  10/05/2020, 7:08 PM

## 2020-10-05 NOTE — ED Provider Notes (Signed)
[facility] EMERGENCY DEPARTMENT Provider Note   CSN: [id number] Arrival date & time: 10/04/20  1915     History Chief Complaint  Patient presents with  . Dysuria    MARKEL KURTENBACH is a 64 y.o. male.   Dysuria Presenting symptoms: dysuria   Relieved by:  Nothing Worsened by:  Nothing Ineffective treatments:  None tried Associated symptoms: diarrhea (with BRBPR)   Associated symptoms: no fever, no flank pain, no nausea, no penile redness, no penile swelling and no vomiting   Risk factors: does not have multiple sexual partners and no recent infection        No past medical history on file.  Patient Active Problem List   Diagnosis Date Noted  . Screening for hypertension 08/26/2016    No past surgical history on file.     No family history on file.  Social History   Tobacco Use  . Smoking status: Never Smoker  . Smokeless tobacco: Current User    Types: Snuff  Vaping Use  . Vaping Use: Never used  Substance Use Topics  . Alcohol use: Yes  . Drug use: No    Home Medications Prior to Admission medications   Medication Sig Start Date End Date Taking? Authorizing Provider  Acetaminophen (TYLENOL 8 HOUR PO) Take 800 mg by mouth daily at 6 (six) AM.    [provider]  fluticasone (FLONASE) 50 MCG/ACT nasal spray Place 2 sprays into both nostrils daily.    [provider]  HYDROcodone-acetaminophen (NORCO/VICODIN) 5-325 MG tablet Take 1 tablet by mouth every 6 (six) hours as needed for moderate pain. 03/07/17   Ripley Fraise, MD  ibuprofen (ADVIL,MOTRIN) 200 MG tablet Take 200-400 mg by mouth every 6 (six) hours as needed for moderate pain.    [provider]  loratadine (CLARITIN) 10 MG tablet Take 1 tablet (10 mg total) by mouth daily. 09/14/11 09/13/12  Abran Richard, PA-C  ondansetron (ZOFRAN ODT) 4 MG disintegrating tablet Take 1 tablet (4 mg total) by mouth every 8 (eight) hours as needed for nausea or  vomiting. 06/29/20   Antonietta Breach, PA-C    Allergies    Patient has no known allergies.  Review of Systems   Review of Systems  Constitutional: Negative for chills and fever.  HENT: Negative for congestion and rhinorrhea.   Respiratory: Negative for cough and shortness of breath.   Cardiovascular: Negative for chest pain and palpitations.  Gastrointestinal: Positive for anal bleeding, diarrhea (with BRBPR) and rectal pain. Negative for nausea and vomiting.  Genitourinary: Positive for dysuria. Negative for difficulty urinating, flank pain and penile swelling.  Musculoskeletal: Negative for arthralgias and back pain.  Skin: Negative for color change and rash.  Neurological: Negative for light-headedness and headaches.    Physical Exam Updated Vital Signs BP 117/85   Pulse 73   Temp 98.6 F (37 C) (Oral)   Resp (!) 25   SpO2 97%   Physical Exam Vitals and nursing note reviewed.  Constitutional:      General: He is not in acute distress.    Appearance: Normal appearance.  HENT:     Head: Normocephalic and atraumatic.     Nose: No rhinorrhea.  Eyes:     General:        Right eye: No discharge.        Left eye: No discharge.     Conjunctiva/sclera: Conjunctivae normal.  Cardiovascular:     Rate and Rhythm: Normal rate and regular  rhythm.  Pulmonary:     Effort: Pulmonary effort is normal.     Breath sounds: No stridor.  Abdominal:     General: Abdomen is flat. There is no distension.     Palpations: Abdomen is soft. There is no mass.     Tenderness: There is no abdominal tenderness. There is no guarding or rebound.     Hernia: No hernia is present.  Genitourinary:    Comments: Rectal exam was negative for melena or gross blood, the rectal vault was empty, I did not palpate any hemorrhoids did not palpate any fissures Musculoskeletal:        General: No deformity or signs of injury.  Skin:    General: Skin is warm and dry.  Neurological:     General: No focal  deficit present.     Mental Status: He is alert. Mental status is at baseline.     Motor: No weakness.  Psychiatric:        Mood and Affect: Mood normal.        Behavior: Behavior normal.        Thought Content: Thought content normal.     ED Results / Procedures / Treatments   Labs (all labs ordered are listed, but only abnormal results are displayed) Labs Reviewed  URINALYSIS, ROUTINE W REFLEX MICROSCOPIC - Abnormal; Notable for the following components:      Result Value   Color, Urine COLORLESS (*)    Specific Gravity, Urine 1.002 (*)    All other components within normal limits  CBC WITH DIFFERENTIAL/PLATELET - Abnormal; Notable for the following components:   RBC 4.06 (*)    Hemoglobin 11.7 (*)    HCT 37.4 (*)    Eosinophils Absolute 0.7 (*)    All other components within normal limits  COMPREHENSIVE METABOLIC PANEL - Abnormal; Notable for the following components:   Glucose, Bld 115 (*)    BUN 5 (*)    Albumin 3.0 (*)    All other components within normal limits  GASTROINTESTINAL PANEL BY PCR, STOOL (REPLACES STOOL CULTURE)  C DIFFICILE (CDIFF) QUICK SCRN (NO PCR REFLEX)  RESP PANEL BY RT-PCR (FLU A&B, COVID) ARPGX2  LIPASE, BLOOD  LACTIC ACID, PLASMA  LACTIC ACID, PLASMA    EKG None  Radiology No results found.  Procedures Procedures (including critical care time)  Medications Ordered in ED Medications - No data to display  ED Course  I have reviewed the triage vital signs and the nursing notes.  Pertinent labs & imaging results that were available during my care of the patient were reviewed by me and considered in my medical decision making (see chart for details).    MDM Rules/Calculators/A&P                          Patient's had months of rectal discomfort and dysuria described as pressure, and is recently decided to seek care.  Denies any fevers nausea vomiting, works in [employer] but no known sick contacts.  Passing stools but they are loose  and he says there may be blood involved.  No hematuria, tolerating oral hydration and nutrition.  Urinalysis after review shows no signs of infection no blood.  We will get other screening labs and the likely have this patient follow-up with outpatient gastroenterology.  I sent a text page with Dr. Collene Mares from gastroenterology who recommends observing the patient, will check a repeat hemoglobin, possible need for inpatient consultation.  Patient has no outpatient medical care thus far think this is wise choice to keep him safe, possible just a colitis, however there may be something that needs further imaging worse laboratory studies.  Stool studies sent.  Respiratory panel sent for admission purposes   Final Clinical Impression(s) / ED Diagnoses Final diagnoses:  Gastrointestinal hemorrhage, unspecified gastrointestinal hemorrhage type  Decreased hemoglobin    Rx / DC Orders ED Discharge Orders    None       Breck Coons, MD 10/05/20 1042

## 2020-10-06 ENCOUNTER — Observation Stay (HOSPITAL_COMMUNITY): Payer: Medicare Other

## 2020-10-06 ENCOUNTER — Encounter (HOSPITAL_COMMUNITY): Payer: Self-pay | Admitting: Internal Medicine

## 2020-10-06 DIAGNOSIS — R197 Diarrhea, unspecified: Secondary | ICD-10-CM | POA: Diagnosis not present

## 2020-10-06 DIAGNOSIS — K922 Gastrointestinal hemorrhage, unspecified: Secondary | ICD-10-CM | POA: Diagnosis not present

## 2020-10-06 DIAGNOSIS — F1020 Alcohol dependence, uncomplicated: Secondary | ICD-10-CM

## 2020-10-06 DIAGNOSIS — K529 Noninfective gastroenteritis and colitis, unspecified: Secondary | ICD-10-CM | POA: Diagnosis present

## 2020-10-06 LAB — COMPREHENSIVE METABOLIC PANEL
ALT: 11 U/L (ref 0–44)
AST: 17 U/L (ref 15–41)
Albumin: 2.8 g/dL — ABNORMAL LOW (ref 3.5–5.0)
Alkaline Phosphatase: 58 U/L (ref 38–126)
Anion gap: 10 (ref 5–15)
BUN: 10 mg/dL (ref 8–23)
CO2: 23 mmol/L (ref 22–32)
Calcium: 9 mg/dL (ref 8.9–10.3)
Chloride: 104 mmol/L (ref 98–111)
Creatinine, Ser: 1.02 mg/dL (ref 0.61–1.24)
GFR, Estimated: >60 mL/min (ref >60)
Glucose, Bld: 145 mg/dL — ABNORMAL HIGH (ref 70–99)
Potassium: 3.4 mmol/L — ABNORMAL LOW (ref 3.5–5.1)
Sodium: 137 mmol/L (ref 135–145)
Total Bilirubin: 0.2 mg/dL — ABNORMAL LOW (ref 0.3–1.2)
Total Protein: 6.9 g/dL (ref 6.5–8.1)

## 2020-10-06 LAB — CBC WITH DIFFERENTIAL/PLATELET
Abs Immature Granulocytes: 0.03 10*3/uL (ref 0.00–0.07)
Basophils Absolute: 0 10*3/uL (ref 0.0–0.1)
Basophils Relative: 0 %
Eosinophils Absolute: 0.8 10*3/uL — ABNORMAL HIGH (ref 0.0–0.5)
Eosinophils Relative: 8 %
HCT: 34.6 % — ABNORMAL LOW (ref 39.0–52.0)
Hemoglobin: 11.2 g/dL — ABNORMAL LOW (ref 13.0–17.0)
Immature Granulocytes: 0 %
Lymphocytes Relative: 32 %
Lymphs Abs: 2.9 10*3/uL (ref 0.7–4.0)
MCH: 29.2 pg (ref 26.0–34.0)
MCHC: 32.4 g/dL (ref 30.0–36.0)
MCV: 90.1 fL (ref 80.0–100.0)
Monocytes Absolute: 0.8 10*3/uL (ref 0.1–1.0)
Monocytes Relative: 9 %
Neutro Abs: 4.6 10*3/uL (ref 1.7–7.7)
Neutrophils Relative %: 51 %
Platelets: 374 10*3/uL (ref 150–400)
RBC: 3.84 MIL/uL — ABNORMAL LOW (ref 4.22–5.81)
RDW: 13.3 % (ref 11.5–15.5)
WBC: 9.1 10*3/uL (ref 4.0–10.5)
nRBC: 0 % (ref 0.0–0.2)

## 2020-10-06 LAB — GASTROINTESTINAL PANEL BY PCR, STOOL (REPLACES STOOL CULTURE)

## 2020-10-06 MED ORDER — PANTOPRAZOLE SODIUM 40 MG PO TBEC
40.0000 mg | DELAYED_RELEASE_TABLET | Freq: Two times a day (BID) | ORAL | Status: DC
  Administered 2020-10-06: 15:00:00 40 mg via ORAL
  Filled 2020-10-06: qty 1

## 2020-10-06 MED ORDER — PANTOPRAZOLE SODIUM 40 MG PO TBEC: 40.0000 mg | DELAYED_RELEASE_TABLET | Freq: Every day | ORAL | Status: DC

## 2020-10-06 NOTE — Progress Notes (Signed)
   Subjective:   Mr. Wessinger states that he is doing well today.  He continues to have frequent small-volume diarrhea and a feeling of needing to have a bowel movement.  He continues to have some pressure he sits down.  On further evaluation, Mr. Jurney states that his stool was actually never black but just a dark brown.  Lately, it seems to be like a mucus that is clear or white.  He notes that a few weeks ago he did see some small right red streaks in his stool but this is never happened again since.  He denies any nausea or vomiting.  He denies any abdominal pain  We discussed that his work-up would be most appropriately done in the outpatient setting.  Mr. Dec would like to establish care in our clinic and we will be able to refer him to GI for colonoscopy.  Mr. Mees is in agreement with this plan.  Objective:  Vital signs in last 24 hours: Vitals:   10/05/20 2132 10/05/20 2255 10/06/20 0530 10/06/20 1215  BP: (!) 146/92 140/88 120/85 120/84  Pulse: 92 85 82 84  Resp: 18 20 16 16   Temp: (!) 97.4 F (36.3 C) 98.2 F (36.8 C) 97.8 F (36.6 C) (!) 97.2 F (36.2 C)  TempSrc: Oral Oral Oral Oral  SpO2: 100% 100% 98% 98%   Physical Exam Vitals and nursing note reviewed.  Constitutional:      General: He is not in acute distress.    Appearance: He is normal weight.  Pulmonary:     Effort: Pulmonary effort is normal. No respiratory distress.  Neurological:     General: No focal deficit present.     Mental Status: He is alert and oriented to person, place, and time. Mental status is at baseline.  Psychiatric:        Mood and Affect: Mood normal.        Behavior: Behavior normal.    Assessment/Plan:  Active Problems:   Chronic diarrhea  Mr. Tedd Cottrill is a 64 y/o male with no significant PMHx due to lack of medical follow up who presents with c/o diarrhea and admitted for evaluation of melena and worsening chronic diarrhea.   # Chronic Diarrhea # Bowel Incontinence  On  further evaluation today, patient declines any history of melena and we have not witnessed any here since he has been admitted.  There is still concern for IBD versus malignancy, however it is most appropriate for patient to have work-up in the outpatient setting.  He is hemodynamically stable with very mild normocytic anemia that is stable.  Due to this, we will discharge him with close outpatient follow-up and plan for referral to GI.  # Alcohol Abuse  We strongly urged patient to stop drinking and he noted that he will work on it.  Will need close outpatient follow-up.  Dispo: Anticipated discharge in approximately 0 day(s).   Dr. Jose Persia Internal Medicine PGY-2  Pager: [phone] After 5pm on weekdays and 1pm on weekends: On Call pager [phone]  10/06/2020, 2:17 PM

## 2020-10-06 NOTE — Discharge Summary (Signed)
Name: Phillip Brooks MRN: [id number] DOB: 1956-10-12 64 y.o. PCP: Patient, No Pcp Per  Date of Admission: 10/04/2020  7:24 PM Date of Discharge: 10/06/2020 Attending Physician: Angelica Pou, MD  Discharge Diagnosis: 1. Chronic Diarrhea   Discharge Medications: Allergies as of 10/06/2020   No Known Allergies     Medication List    STOP taking these medications   ondansetron 4 MG disintegrating tablet Commonly known as: Zofran ODT   TYLENOL 8 HOUR PO      Disposition and follow-up:   PhillipPhillip Brooks was discharged from [facility] in Good condition.  At the hospital follow up visit please address:  1.    Referral to GI for colonoscopy and evaluation.  Patient is behind on healthcare maintenance including prostate exam, colonoscopy, screening labs, etc.  2.  Labs / imaging needed at time of follow-up: CBC  3.  Pending labs/ test needing follow-up: None  Follow-up Appointments:  Follow-up Information    Jose Persia, MD Follow up.   Specialty: Internal Medicine Why: Our clinic will call you to schedule an appointment.  Contact information: [address] [place] [address] [place] [facility] Course by problem list: 1. Chronic Diarrhea:  Phillip Brooks presented to the ED with complaints of chronic diarrhea.  Although he is a poor historian and struggles with establishing a timeline for [place], he states that about 6 months ago, he started having fluctuating episodes of constipation and diarrhea.  This progressed to only diarrhea about 2 months ago that has become significantly severe in the last 2 weeks.  This is paired with lack of appetite, weight loss, tenesmus, and feeling of a pressure in his lower abdomen and rectal region.  He initially stated to the ED that he did notice of black stool, however on clarification he states it always been dark brown.  Patient is at risk for colorectal malignancy versus  inflammatory bowel disease versus IBS.  Inflammatory markers were noted to be elevated with ESR of 52 and CRP of 4.5.  Fecal calprotectin ordered and elevated at 251.  Infectious causes was ruled out with a GI panel that was negative including C. difficile. Given that this is a chronic problem without any evidence of need for emergent colonoscopy, patient was discharged with instructions to follow-up in the clinic.  Discharge Vitals:   BP 120/84 (BP Location: Left Arm)   Pulse 84   Temp (!) 97.2 F (36.2 C) (Oral)   Resp 16   SpO2 98%   Pertinent Labs, Studies, and Procedures:  CBC Latest Ref Rng & Units 10/06/2020 10/05/2020 06/29/2020  WBC 4.0 - 10.5 K/uL 9.1 8.8 7.7  Hemoglobin 13.0 - 17.0 g/dL 11.2(L) 11.7(L) 13.8  Hematocrit 39.0 - 52.0 % 34.6(L) 37.4(L) 42.0  Platelets 150 - 400 K/uL 374 395 241   BMP Latest Ref Rng & Units 10/06/2020 10/05/2020 06/29/2020  Glucose 70 - 99 mg/dL 145(H) 115(H) 107(H)  BUN 8 - 23 mg/dL 10 5(L) 15  Creatinine 0.61 - 1.24 mg/dL 1.02 0.96 1.19  Sodium 135 - 145 mmol/L 137 136 135  Potassium 3.5 - 5.1 mmol/L 3.4(L) 3.7 4.5  Chloride 98 - 111 mmol/L 104 102 98  CO2 22 - 32 mmol/L '23 23 23  ' Calcium 8.9 - 10.3 mg/dL 9.0 9.0 9.2   Discharge Instructions: Discharge Instructions    Call MD for:  difficulty breathing,  headache or visual disturbances   Complete by: As directed    Call MD for:  extreme fatigue   Complete by: As directed    Call MD for:  persistant dizziness or light-headedness   Complete by: As directed    Call MD for:  persistant nausea and vomiting   Complete by: As directed    Call MD for:  severe uncontrolled pain   Complete by: As directed    Call MD for:  temperature >100.4   Complete by: As directed    Diet - low sodium heart healthy   Complete by: As directed    Discharge instructions   Complete by: As directed    Phillip Brooks, Phillip Brooks were admitted to the hospital due to concern of a stomach bleed.  We are happy to see that  your blood counts are stable and there is no evidence of a bleed at this time.  We will have our clinic contact you to make a primary care appointment, where we can refer you to the gastrointestinal doctors, who do the colonoscopy.  Thank you for allowing [place] to participate in your care,  Dr. Charleen Kirks (Dr. B)   Increase activity slowly   Complete by: As directed       Signed: Dr. Jose Persia Internal Medicine PGY-2  Pager: [phone] After 5pm on weekdays and 1pm on weekends: On Call pager [phone]  10/06/2020, 2:41 PM

## 2020-10-08 LAB — CALPROTECTIN, FECAL: Calprotectin, Fecal: 251 ug/g — ABNORMAL HIGH (ref 0–120)

## 2020-10-14 ENCOUNTER — Encounter: Payer: Medicare Other | Admitting: Internal Medicine

## 2020-10-21 ENCOUNTER — Encounter: Payer: Self-pay | Admitting: Internal Medicine

## 2020-10-21 ENCOUNTER — Other Ambulatory Visit: Payer: Self-pay

## 2020-10-21 ENCOUNTER — Ambulatory Visit (INDEPENDENT_AMBULATORY_CARE_PROVIDER_SITE_OTHER): Payer: Medicare Other | Admitting: Internal Medicine

## 2020-10-21 VITALS — BP 111/64 | HR 93 | Temp 97.8°F | Ht 67.0 in | Wt 138.1 lb

## 2020-10-21 DIAGNOSIS — Z1159 Encounter for screening for other viral diseases: Secondary | ICD-10-CM

## 2020-10-21 DIAGNOSIS — K529 Noninfective gastroenteritis and colitis, unspecified: Secondary | ICD-10-CM | POA: Diagnosis not present

## 2020-10-21 DIAGNOSIS — K921 Melena: Secondary | ICD-10-CM

## 2020-10-21 LAB — POC HEMOCCULT BLD/STL (OFFICE/1-CARD/DIAGNOSTIC): Fecal Occult Blood, POC: NEGATIVE

## 2020-10-21 NOTE — Patient Instructions (Signed)
Mr. Gann, We are pleased to have you establishing care with [place].   Today we discussed your weight loss, diarrhea and blood in your stool. These are concerning symptoms, so we are going to refer you to a specialist to have this further worked up as soon as possible.   I am checking some labs today and will let you know when I have these results.   Take care, Dr. Chesley Mires

## 2020-10-22 ENCOUNTER — Encounter: Payer: Self-pay | Admitting: Internal Medicine

## 2020-10-22 LAB — CBC WITH DIFFERENTIAL/PLATELET
Basophils Absolute: 0 10*3/uL (ref 0.0–0.2)
Basos: 0 %
EOS (ABSOLUTE): 0.6 10*3/uL — ABNORMAL HIGH (ref 0.0–0.4)
Eos: 9 %
Hematocrit: 36.7 % — ABNORMAL LOW (ref 37.5–51.0)
Hemoglobin: 11.7 g/dL — ABNORMAL LOW (ref 13.0–17.7)
Immature Grans (Abs): 0 10*3/uL (ref 0.0–0.1)
Immature Granulocytes: 0 %
Lymphocytes Absolute: 2.4 10*3/uL (ref 0.7–3.1)
Lymphs: 36 %
MCH: 27.3 pg (ref 26.6–33.0)
MCHC: 31.9 g/dL (ref 31.5–35.7)
MCV: 86 fL (ref 79–97)
Monocytes Absolute: 0.5 10*3/uL (ref 0.1–0.9)
Monocytes: 8 %
Neutrophils Absolute: 3.1 10*3/uL (ref 1.4–7.0)
Neutrophils: 47 %
Platelets: 569 10*3/uL — ABNORMAL HIGH (ref 150–450)
RBC: 4.29 x10E6/uL (ref 4.14–5.80)
RDW: 12.8 % (ref 11.6–15.4)
WBC: 6.7 10*3/uL (ref 3.4–10.8)

## 2020-10-22 LAB — C-REACTIVE PROTEIN: CRP: 10 mg/L (ref 0–10)

## 2020-10-22 LAB — HEPATITIS C ANTIBODY: Hep C Virus Ab: <0.1 s/co ratio (ref 0.0–0.9)

## 2020-10-22 LAB — IRON: Iron: 52 ug/dL (ref 38–169)

## 2020-10-22 LAB — FERRITIN: Ferritin: 606 ng/mL — ABNORMAL HIGH (ref 30–400)

## 2020-10-22 LAB — SEDIMENTATION RATE: Sed Rate: 80 mm/hr — ABNORMAL HIGH (ref 0–30)

## 2020-10-22 NOTE — Progress Notes (Signed)
 New Patient Office Visit  Subjective:  Patient ID: Phillip Brooks, male    DOB: 08/15/1956  Age: 65 y.o. MRN: [id number]  CC:  Chief Complaint  Patient presents with  . Blood In Stools    HPI Phillip Brooks presents for hospital follow-up to further work-up 6 month history of diarrhea, intermittent hematochezia, tenesmus and unintentional weight loss. Please problem based charting for further details.   History reviewed. No pertinent past medical history.  Past surgical history: none   Family history: HTN, MI. His older sister just recently passed away from cancer, but he is unsure what type.   Social history: he does not smoke tobacco, but has been dipping for the last 2 years. He drinks at least a 6 pack of beer almost daily. No illicit drug use. He previously worked as a truck driver. Currently lives with a roommate.    ROS Review of Systems  Constitutional: Positive for unexpected weight change. Negative for activity change, chills, fatigue and fever.  HENT: Negative for trouble swallowing.   Eyes: Negative for visual disturbance.  Respiratory: Negative for cough and shortness of breath.   Cardiovascular: Negative for chest pain, palpitations and leg swelling.  Gastrointestinal: Positive for blood in stool, diarrhea and rectal pain. Negative for abdominal pain, nausea and vomiting.  Genitourinary: Positive for difficulty urinating and urgency. Negative for hematuria, penile pain and testicular pain.  Musculoskeletal: Negative for back pain and joint swelling.  Skin: Negative for rash.  Neurological: Negative for dizziness, light-headedness and headaches.  Psychiatric/Behavioral: Negative for sleep disturbance.    Objective:   Today's Vitals: BP 111/64 (BP Location: Left Arm, Patient Position: Sitting, Cuff Size: Normal)   Pulse 93   Temp 97.8 F (36.6 C) (Oral)   Ht 5' 7" (1.702 m)   Wt 138 lb 1.6 oz (62.6 kg)   SpO2 96% Comment: room air  BMI 21.63 kg/m    Physical Exam Exam conducted with a chaperone present.  Constitutional:      General: He is not in acute distress.    Appearance: Normal appearance.  Eyes:     Conjunctiva/sclera: Conjunctivae normal.  Cardiovascular:     Rate and Rhythm: Normal rate and regular rhythm.     Pulses: Normal pulses.  Pulmonary:     Effort: Pulmonary effort is normal.     Breath sounds: Normal breath sounds.  Abdominal:     General: Bowel sounds are normal.     Palpations: Abdomen is soft. There is no hepatomegaly or splenomegaly.     Tenderness: There is no abdominal tenderness.  Genitourinary:    Prostate: Normal.     Rectum: Guaiac result negative. Tenderness present. Normal anal tone.  Musculoskeletal:     Cervical back: Neck supple.     Right lower leg: No edema.     Left lower leg: No edema.  Lymphadenopathy:     Cervical: No cervical adenopathy.  Skin:    General: Skin is warm and dry.  Neurological:     General: No focal deficit present.     Mental Status: He is alert.  Psychiatric:        Mood and Affect: Mood normal.        Behavior: Behavior normal.     Assessment & Plan:   Problem List Items Addressed This Visit      Digestive   Chronic diarrhea    Patient presents to establish care and for hospital follow-up to further work-up 6 month history   of chronic loose stools. He also endorses associated tenesmus, rectal pain, intermittent hematochezia, and an unintentional 18 lb weight loss (he is unable to tell me over how much time).  He denies any symptoms that would be concerning for symptomatic anemia.  His work-up in the hospital was significant for normocytic anemia and elevated inflammatory markers.  We will repeat CRP and ESR today in order to trend, as well as repeat CBC to see if anemia has worsened. His FOBT is negative in the office today. Rectal exam unremarkable other than significant pain with DRE.  Symptoms concerning for inflammatory bowel disease versus colorectal  malignancy. Will place urgent referral to GI, as he will need to undergo colonoscopy as soon as possible.       Relevant Orders   CBC with Diff (Completed)   POC Hemoccult Bld/Stl (1-Cd Office Dx) (Completed)   Ambulatory referral to Gastroenterology    Other Visit Diagnoses    Blood in stool    -  Primary   Relevant Orders   Ferritin (Completed)   Iron (Completed)   Sed Rate (ESR) (Completed)   CRP (C-Reactive Protein) (Completed)   Ambulatory referral to Gastroenterology   Encounter for hepatitis C screening test for low risk patient       Relevant Orders   Hepatitis C antibody (Completed)      No outpatient encounter medications on file as of 10/21/2020.   No facility-administered encounter medications on file as of 10/21/2020.    Follow-up: Return in about 5 weeks (around 11/25/2020) for Follow-up on GI issues .    D , DO 

## 2020-10-22 NOTE — Assessment & Plan Note (Signed)
Patient presents to establish care and for hospital follow-up to further work-up 6 month history of chronic loose stools. He also endorses associated tenesmus, rectal pain, intermittent hematochezia, and an unintentional 18 lb weight loss (he is unable to tell me over how much time).  He denies any symptoms that would be concerning for symptomatic anemia.  His work-up in the hospital was significant for normocytic anemia and elevated inflammatory markers.  We will repeat CRP and ESR today in order to trend, as well as repeat CBC to see if anemia has worsened. His FOBT is negative in the office today. Rectal exam unremarkable other than significant pain with DRE.  Symptoms concerning for inflammatory bowel disease versus colorectal malignancy. Will place urgent referral to GI, as he will need to undergo colonoscopy as soon as possible.

## 2020-10-23 NOTE — Progress Notes (Signed)
Internal Medicine Clinic Attending  Case discussed with Dr. Chesley Mires  At the time of the visit.  We reviewed the resident's history and exam and pertinent patient test results.  I agree with the assessment, diagnosis, and plan of care documented in the resident's note.  Patient is known to me from recent brief hospitalization; colonoscopy is important next step.

## 2020-10-30 ENCOUNTER — Encounter: Payer: Self-pay | Admitting: Gastroenterology

## 2020-11-04 ENCOUNTER — Ambulatory Visit: Payer: Medicare Other | Admitting: Gastroenterology

## 2020-11-11 ENCOUNTER — Encounter: Payer: Self-pay | Admitting: Nurse Practitioner

## 2020-11-11 ENCOUNTER — Ambulatory Visit (INDEPENDENT_AMBULATORY_CARE_PROVIDER_SITE_OTHER): Payer: Medicare Other | Admitting: Nurse Practitioner

## 2020-11-11 VITALS — BP 124/80 | HR 104 | Ht 66.0 in | Wt 137.0 lb

## 2020-11-11 DIAGNOSIS — K921 Melena: Secondary | ICD-10-CM | POA: Diagnosis not present

## 2020-11-11 DIAGNOSIS — D649 Anemia, unspecified: Secondary | ICD-10-CM | POA: Insufficient documentation

## 2020-11-11 DIAGNOSIS — R197 Diarrhea, unspecified: Secondary | ICD-10-CM

## 2020-11-11 MED ORDER — SUPREP BOWEL PREP KIT 17.5-3.13-1.6 GM/177ML PO SOLN: 1.0000 | ORAL | 0 refills | Status: DC

## 2020-11-11 NOTE — Progress Notes (Signed)
11/11/2020 Phillip Brooks [id number] [birth year]   CHIEF COMPLAINT: Diarrhea, rectal bleeding   HISTORY OF PRESENT ILLNESS:  Phillip Brooks is a 65 year old male without any significant past medical history. Past right wrist surgery and tonsillectomy. He presents to our office today for further evaluation for diarrhea x 6 months which has progressively worsened.  He had dysuria and passed a moderate amount of darker red blood without passing any stool on 10/05/2020. He presented to the ED for further evaluation. Labs in the ED showed a Hg level of 11.7. HCT 37.4. MCV 92.1. PLT 395. BUN 5. Cr. 0.96. LFTs were normal. CRP 4.5.  Fecal calprotectin level 251.  C. difficile PCR and C. difficile antigen were negative.  GI pathogen panel was negative.  Urinalysis was negative.  He was discharged home on 10/06/2020 with instructions to schedule GI follow-up and a colonoscopy as an outpatient.  Additional laboratory studies were done by his PCP on 10/21/2020 showed a hemoglobin level 11.7.  Hematocrit 36.7.  Normal iron level of 52.  Ferritin 606.  CRP 10.  Currently, he continues to have diarrhea mostly at nighttime.  He describes passing 6-10 watery to loose mud-like to almost coffee-ground appearing diarrhea bowel movements daily.  He has intermittent abdominal pressure without significant abdominal pain.  No nausea or vomiting.  No further hematochezia since being evaluated by the ED.  No mucus per the rectum.  No known family history of IBD or colorectal cancer.  He takes BC powder 1 packet 2 to 3 days weekly for generalized aches and pains.  No dysphagia, heartburn or upper abdominal pain.  No melena.  He reports losing weight but it is unclear how much weight he has lost over the past year. Weight 03/2016 was 149 lbs. Today's weight 137 lbs.  No fever, sweats or chills.  Social history: He is married.  He has 1 son and 1 daughter.  He is retired.  Past smoker, dips tobacco for the past 2 years.  He  drinks up to 6 beers daily and some days no alcohol.  No drug use.  Family history: Mother with history of hypertension.  Father with history of MI.  Sister with history of cancer, further details unclear.  No family history of esophageal, gastric or colon cancer.  No Known Allergies   Current Outpatient Medications on File Prior to Visit  Medication Sig Dispense Refill  . acetaminophen (TYLENOL) 650 MG CR tablet Take 650 mg by mouth every 8 (eight) hours as needed for pain.    . Cromolyn Sodium (NASAL ALLERGY NA) Place into the nose.    . diphenhydrAMINE HCl (BENADRYL ALLERGY PO) Take by mouth.     No current facility-administered medications on file prior to visit.    REVIEW OF SYSTEMS:  Gen: Denies fever, sweats or chills. No weight Brooks.  CV: Denies chest pain, palpitations or edema. Resp: Denies cough, shortness of breath of hemoptysis.  GI: See HPI. GU : Denies urinary burning, blood in urine, increased urinary frequency or incontinence. MS: + Arthritis. Derm: Denies rash, itchiness, skin lesions or unhealing ulcers. Psych: Denies depression, anxiety Phillip Brooks. Heme: Denies bruising, bleeding. Neuro:  Denies headaches, dizziness or paresthesias. Endo:  Denies any problems with DM, thyroid or adrenal function.  PHYSICAL EXAM: BP 124/80   Pulse (!) 104   Ht 5\' 6"  (1.676 m)   Wt 137 lb (62.1 kg)   BMI 22.11 kg/m   General: 65 year  old male in no acute distress. Head: Normocephalic and atraumatic. Eyes:  Sclerae non-icteric, conjunctive pink. Ears: Normal auditory acuity. Mouth: Poor dentition, scattered missing teeth.  No ulcers or lesions.  Neck: Supple, no lymphadenopathy or thyromegaly.  Lungs: Clear bilaterally to auscultation without wheezes, crackles or rhonchi. Heart: Regular rate and rhythm. No murmur, rub or gallop appreciated.  Abdomen: Soft, nontender, non distended. No masses. No hepatosplenomegaly. Normoactive bowel sounds x 4 quadrants.  Rectal:  Deferred. Musculoskeletal: Symmetrical with no gross deformities. Skin: Warm and dry. No rash or lesions on visible extremities. Extremities: No edema. Neurological: Alert oriented x 4, no focal deficits.  Psychological:  Alert and cooperative. Normal mood and affect.  ASSESSMENT AND PLAN:  36.  65 year old male with chronic diarrhea and hematochezia x1 episode 09/2020. Negative GI pathogen panel and C. Diff testing. Elevated CRP, sed rate and fecal calprotectin levels suggestive of IBD.  Diarrhea is predominantly at night time, somewhat concerning for secretory diarrhea.  -Colonoscopy to rule out IBD and to rule out colorectal cancer. Colonoscopy benefits and risks discussed including risk with sedation, risk of bleeding, perforation and infection  -Further recommendations to be determined after colonoscopy completed -If colonoscopy negative, to consider CTAP and urinary 5HIAA to assess for carcinoid syndrome -Patient to call our office if blood per the rectum recurs or if his diarrhea worsens -Benefiber 1 tablespoon daily if tolerated  2.  Alcohol overuse -Reduce alcohol intake  3.  Normocytic anemia.  Hemoglobin 11.7.  MCV 86.  Iron level 52. -Check B12 and folate levels next lab draw  4. Prior dysuria -Urology evaluation recommended   5. Weight Brooks -See plan in # 1         CC:  Rehman, Areeg N, DO

## 2020-11-11 NOTE — Patient Instructions (Signed)
If you are age 66 or older, your body mass index should be between 23-30. Your Body mass index is 22.11 kg/m. If this is out of the aforementioned range listed, please consider follow up with your Primary Care Provider.  If you are age 56 or younger, your body mass index should be between 19-25. Your Body mass index is 22.11 kg/m. If this is out of the aformentioned range listed, please consider follow up with your Primary Care Provider.   You have been scheduled for a colonoscopy. Please follow written instructions given to you at your visit today.  Please pick up your prep supplies at the pharmacy within the next 1-3 days. If you use inhalers (even only as needed), please bring them with you on the day of your procedure.  OVER THE COUNTER MEDICATION  Please purchase the following medications over the counter and take as directed:  Benefiber one tablespoons daily.  Please increase your water intake and decrease your alcohol intake.  Please call our office if your symptoms worsen.  It was great seeing you today!  Thank you for entrusting me with your care and choosing [facility].  Noralyn Pick, CRNP

## 2020-11-12 NOTE — Progress Notes (Signed)
I agree with the above note, plan 

## 2020-12-06 ENCOUNTER — Encounter: Payer: Self-pay | Admitting: Gastroenterology

## 2020-12-06 ENCOUNTER — Other Ambulatory Visit (INDEPENDENT_AMBULATORY_CARE_PROVIDER_SITE_OTHER): Payer: Medicare Other

## 2020-12-06 ENCOUNTER — Telehealth: Payer: Self-pay

## 2020-12-06 ENCOUNTER — Other Ambulatory Visit: Payer: Self-pay

## 2020-12-06 ENCOUNTER — Ambulatory Visit (AMBULATORY_SURGERY_CENTER): Payer: Medicare Other | Admitting: Gastroenterology

## 2020-12-06 VITALS — BP 90/68 | HR 74 | Temp 97.5°F | Resp 29 | Ht 66.0 in | Wt 137.0 lb

## 2020-12-06 DIAGNOSIS — D123 Benign neoplasm of transverse colon: Secondary | ICD-10-CM

## 2020-12-06 DIAGNOSIS — K921 Melena: Secondary | ICD-10-CM | POA: Diagnosis not present

## 2020-12-06 DIAGNOSIS — C19 Malignant neoplasm of rectosigmoid junction: Secondary | ICD-10-CM

## 2020-12-06 DIAGNOSIS — D125 Benign neoplasm of sigmoid colon: Secondary | ICD-10-CM

## 2020-12-06 DIAGNOSIS — D649 Anemia, unspecified: Secondary | ICD-10-CM

## 2020-12-06 DIAGNOSIS — D127 Benign neoplasm of rectosigmoid junction: Secondary | ICD-10-CM

## 2020-12-06 DIAGNOSIS — R197 Diarrhea, unspecified: Secondary | ICD-10-CM

## 2020-12-06 LAB — CBC
HCT: 36.8 % — ABNORMAL LOW (ref 39.0–52.0)
Hemoglobin: 12 g/dL — ABNORMAL LOW (ref 13.0–17.0)
MCHC: 32.5 g/dL (ref 30.0–36.0)
MCV: 87.7 fl (ref 78.0–100.0)
Platelets: 433 10*3/uL — ABNORMAL HIGH (ref 150.0–400.0)
RBC: 4.2 Mil/uL — ABNORMAL LOW (ref 4.22–5.81)
RDW: 14.6 % (ref 11.5–15.5)
WBC: 8.8 10*3/uL (ref 4.0–10.5)

## 2020-12-06 LAB — PROTIME-INR
INR: 1.1 ratio — ABNORMAL HIGH (ref 0.8–1.0)
Prothrombin Time: 12.5 s (ref 9.6–13.1)

## 2020-12-06 MED ORDER — SODIUM CHLORIDE 0.9 % IV SOLN: 500.0000 mL | Freq: Once | INTRAVENOUS | Status: DC

## 2020-12-06 MED ORDER — HYOSCYAMINE SULFATE 0.125 MG SL SUBL: 0.1250 mg | SUBLINGUAL_TABLET | SUBLINGUAL | 3 refills | Status: DC | PRN

## 2020-12-06 NOTE — Op Note (Signed)
[facility] Patient Name: Phillip Brooks Procedure Date: 12/06/2020 2:59 PM MRN: [id number] Endoscopist: Milus Banister , MD Age: 65 Referring MD:  Date of Birth: Jul 06, 1956 Gender: Male Account #: [id number] Procedure:                Colonoscopy Indications:              Hematochezia, change in bowels, weight loss Medicines:                Monitored Anesthesia Care Procedure:                Pre-Anesthesia Assessment:                           - Prior to the procedure, a History and Physical                            was performed, and patient medications and                            allergies were reviewed. The patient's tolerance of                            previous anesthesia was also reviewed. The risks                            and benefits of the procedure and the sedation                            options and risks were discussed with the patient.                            All questions were answered, and informed consent                            was obtained. Prior Anticoagulants: The patient has                            taken no previous anticoagulant or antiplatelet                            agents. ASA Grade Assessment: II - A patient with                            mild systemic disease. After reviewing the risks                            and benefits, the patient was deemed in                            satisfactory condition to undergo the procedure.                           After obtaining informed consent, the colonoscope  was passed under direct vision. Throughout the                            procedure, the patient's blood pressure, pulse, and                            oxygen saturations were monitored continuously. The                            Olympus CF-HQ190L [id number]) Colonoscope was                            introduced through the anus and advanced to the the                            cecum, identified by  appendiceal orifice and                            ileocecal valve. The colonoscopy was performed                            without difficulty. The patient tolerated the                            procedure well. Scope In: 3:34:05 PM Scope Out: 3:54:37 PM Scope Withdrawal Time: 0 hours 14 minutes 24 seconds  Total Procedure Duration: 0 hours 20 minutes 32 seconds  Findings:                 Two sessile polyps were found in the sigmoid colon                            and transverse colon. The polyps were 3 to 5 mm in                            size. These polyps were removed with a cold snare.                            Resection and retrieval were complete.                           A circumferential, ulcerated, partially                            obstructive, clearly malignant mass was noted in                            the rectosigmoid region. The mass is 9cm long with                            distal edge about 7cm from the anal verge. I                            biopsied the mass extensively and then  labeled                            mucosa distal to the mass with 3 submucosal                            injections of Spot Complications:            No immediate complications. Estimated blood loss:                            None. Estimated Blood Loss:     Estimated blood loss: none. Impression:               - A circumferential, ulcerated, partially                            obstructive, clearly malignant mass was noted in                            the rectosigmoid region. I was able to advance the                            colonscope proximal to the mass with mild                            resistence only. The mass is 9cm long with distal                            edge about 7cm from the anal verge. I biopsied the                            mass extensively and then labeled mucosa distal to                            the mass with 3 submucosal injections of Spot.                            - Two typical appearing 3-66mm sessile polyps were                            found and removed (transverese and sigmoid                            segements). Recommendation:           - Patient has a contact number available for                            emergencies. The signs and symptoms of potential                            delayed complications were discussed with the                            patient.  Return to normal activities tomorrow.                            Written discharge instructions were provided to the                            patient.                           - Resume previous diet.                           - Continue present medications.                           - Await pathology results.                           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 Milus Banister, MD 12/06/2020 4:13:54 PM This report has been signed electronically.

## 2020-12-06 NOTE — Progress Notes (Signed)
Called to room to assist during endoscopic procedure.  Patient ID and intended procedure confirmed with present staff. Received instructions for my participation in the procedure from the performing physician.  

## 2020-12-06 NOTE — Progress Notes (Signed)
inr

## 2020-12-06 NOTE — Telephone Encounter (Signed)
Per the procedure report 12/06/20 a referral has been made to Med oncology and [facility].    Chest Abd Pelvis CT scheduled for 12/10/20 730 am at [facility].to arrive at 715 am.  The pt will need to drink 2 bottles of contrast at 1 and 2 hours prior to the appt.  NPO after midnight. Contrast given to the pt in the [facility] today.    Labs entered by CMA.    Left message on machine to call back

## 2020-12-06 NOTE — Progress Notes (Signed)
History reviewed today  VS DC

## 2020-12-06 NOTE — Patient Instructions (Signed)
Handout given:  Polyps Resume previous diet Continue current medications Await pathology results MRI and CT exam will be upcoming Lab work before leaving today  YOU HAD AN ENDOSCOPIC PROCEDURE TODAY AT [facility]:   Refer to the procedure report that was given to you for any specific questions about what was found during the examination.  If the procedure report does not answer your questions, please call your gastroenterologist to clarify.  If you requested that your care partner not be given the details of your procedure findings, then the procedure report has been included in a sealed envelope for you to review at your convenience later.  YOU SHOULD EXPECT: Some feelings of bloating in the abdomen. Passage of more gas than usual.  Walking can help get rid of the air that was put into your GI tract during the procedure and reduce the bloating. If you had a lower endoscopy (such as a colonoscopy or flexible sigmoidoscopy) you may notice spotting of blood in your stool or on the toilet paper. If you underwent a bowel prep for your procedure, you may not have a normal bowel movement for a few days.  Please Note:  You might notice some irritation and congestion in your nose or some drainage.  This is from the oxygen used during your procedure.  There is no need for concern and it should clear up in a day or so.  SYMPTOMS TO REPORT IMMEDIATELY:   Following lower endoscopy (colonoscopy or flexible sigmoidoscopy):  Excessive amounts of blood in the stool  Significant tenderness or worsening of abdominal pains  Swelling of the abdomen that is new, acute  Fever of 100F or higher  For urgent or emergent issues, a gastroenterologist can be reached at any hour by calling [phone]. Do not use MyChart messaging for urgent concerns.   DIET:  We do recommend a small meal at first, but then you may proceed to your regular diet.  Drink plenty of fluids but you should avoid alcoholic  beverages for 24 hours.  ACTIVITY:  You should plan to take it easy for the rest of today and you should NOT DRIVE or use heavy machinery until tomorrow (because of the sedation medicines used during the test).    FOLLOW UP: Our staff will call the number listed on your records 48-72 hours following your procedure to check on you and address any questions or concerns that you may have regarding the information given to you following your procedure. If we do not reach you, we will leave a message.  We will attempt to reach you two times.  During this call, we will ask if you have developed any symptoms of COVID 19. If you develop any symptoms (ie: fever, flu-like symptoms, shortness of breath, cough etc.) before then, please call [phone].  If you test positive for Covid 19 in the 2 weeks post procedure, please call and report this information to [place].    If any biopsies were taken you will be contacted by phone or by letter within the next 1-3 weeks.  Please call us at [phone] if you have not heard about the biopsies in 3 weeks.   SIGNATURES/CONFIDENTIALITY: You and/or your care partner have signed paperwork which will be entered into your electronic medical record.  These signatures attest to the fact that that the information above on your After Visit Summary has been reviewed and is understood.  Full responsibility of the confidentiality of this discharge information lies  with you and/or your care-partner.

## 2020-12-06 NOTE — Progress Notes (Signed)
Report to PACU, RN, vss, BBS= Clear.  

## 2020-12-07 LAB — CEA: CEA: 3.8 ng/mL — ABNORMAL HIGH

## 2020-12-09 NOTE — Telephone Encounter (Signed)
The pt has been advised of the CT scan and the referrals to med oncology and [facility].  The pt has been instructed and has been advised to call if he has any questions.

## 2020-12-10 ENCOUNTER — Telehealth: Payer: Self-pay | Admitting: Radiation Oncology

## 2020-12-10 ENCOUNTER — Other Ambulatory Visit (INDEPENDENT_AMBULATORY_CARE_PROVIDER_SITE_OTHER): Payer: Medicare Other

## 2020-12-10 ENCOUNTER — Ambulatory Visit (HOSPITAL_COMMUNITY)
Admission: RE | Admit: 2020-12-10 | Discharge: 2020-12-10 | Disposition: A | Discharge status: Home / Self Care | Payer: Medicare Other | Source: Ambulatory Visit | Attending: Gastroenterology | Admitting: Gastroenterology

## 2020-12-10 ENCOUNTER — Other Ambulatory Visit: Payer: Medicare Other

## 2020-12-10 ENCOUNTER — Other Ambulatory Visit: Payer: Self-pay

## 2020-12-10 ENCOUNTER — Telehealth: Payer: Self-pay

## 2020-12-10 DIAGNOSIS — D125 Benign neoplasm of sigmoid colon: Secondary | ICD-10-CM

## 2020-12-10 DIAGNOSIS — C19 Malignant neoplasm of rectosigmoid junction: Secondary | ICD-10-CM | POA: Diagnosis not present

## 2020-12-10 DIAGNOSIS — K921 Melena: Secondary | ICD-10-CM

## 2020-12-10 DIAGNOSIS — D123 Benign neoplasm of transverse colon: Secondary | ICD-10-CM | POA: Diagnosis not present

## 2020-12-10 DIAGNOSIS — D649 Anemia, unspecified: Secondary | ICD-10-CM

## 2020-12-10 DIAGNOSIS — R197 Diarrhea, unspecified: Secondary | ICD-10-CM

## 2020-12-10 DIAGNOSIS — D127 Benign neoplasm of rectosigmoid junction: Secondary | ICD-10-CM | POA: Diagnosis not present

## 2020-12-10 DIAGNOSIS — K6289 Other specified diseases of anus and rectum: Secondary | ICD-10-CM

## 2020-12-10 LAB — COMPREHENSIVE METABOLIC PANEL
ALT: 7 U/L (ref 0–53)
AST: 12 U/L (ref 0–37)
Albumin: 3.5 g/dL (ref 3.5–5.2)
Alkaline Phosphatase: 61 U/L (ref 39–117)
BUN: 14 mg/dL (ref 6–23)
CO2: 24 mEq/L (ref 19–32)
Calcium: 8.8 mg/dL (ref 8.4–10.5)
Chloride: 101 mEq/L (ref 96–112)
Creatinine, Ser: 0.98 mg/dL (ref 0.40–1.50)
GFR: 80.57 mL/min (ref >60.00)
Glucose, Bld: 87 mg/dL (ref 70–99)
Potassium: 3.8 mEq/L (ref 3.5–5.1)
Sodium: 134 mEq/L — ABNORMAL LOW (ref 135–145)
Total Bilirubin: 0.3 mg/dL (ref 0.2–1.2)
Total Protein: 7.5 g/dL (ref 6.0–8.3)

## 2020-12-10 LAB — POCT I-STAT CREATININE: Creatinine, Ser: 1.1 mg/dL (ref 0.61–1.24)

## 2020-12-10 MED ORDER — IOHEXOL 300 MG/ML  SOLN
100.0000 mL | Freq: Once | INTRAMUSCULAR | Status: AC | PRN
  Administered 2020-12-10: 100 mL via INTRAVENOUS

## 2020-12-10 NOTE — Telephone Encounter (Signed)
  Follow up Call-  Call back number 12/06/2020  Post procedure Call Back phone  # [phone]  Permission to leave phone message Yes  Some recent data might be hidden     Patient questions:  Do you have a fever, pain , or abdominal swelling? No. Pain Score  0 *  Have you tolerated food without any problems? Yes.    Have you been able to return to your normal activities? Yes.    Do you have any questions about your discharge instructions: Diet   No. Medications  No. Follow up visit  No.  Do you have questions or concerns about your Care? No.  Actions: * If pain score is 4 or above: No action needed, pain <4. 1. Have you developed a fever since your procedure? no  2.   Have you had an respiratory symptoms (SOB or cough) since your procedure? no  3.   Have you tested positive for COVID 19 since your procedure no  4.   Have you had any family members/close contacts diagnosed with the COVID 19 since your procedure?  no   If yes to any of these questions please route to Joylene John, RN and Joella Prince, RN

## 2020-12-10 NOTE — Telephone Encounter (Signed)
Rec'd a message from Shona Simpson, [place] to change patient's 2/24 @ 7:30a to now at 3:00p, so that it can be done after seeing Dr. Benay Spice. Spoke with patient and he confirmed understanding.

## 2020-12-10 NOTE — Progress Notes (Signed)
Left voice message for patient that we have had to shift his appointments around for Thursday 12/12/20.  I asked him to arrive by 1:40 to see Dr. Benay Spice at 2 pm and he will then have his consult with Radiation Oncology at 3 pm.  I left my direct number for him to call me back.

## 2020-12-11 NOTE — Progress Notes (Incomplete)
GI Location of Tumor / Histology: Rectal Mass  LIO WEHRLY presented with a 6 month history of chronic loose stools.  He also reports associated tenesmus, rectal pain, intermittent hematochezia, and unintentional 18 lb weight loss.  He presented to the ED 10/05/2020 after having dysuria and passing a moderate amount of darker red blood without pass any stool.  Colonoscopy 12/06/2020:   Biopsies of   Past/Anticipated interventions by GI, if any:   NP Cottie Banda Smith/ Dr. Ardis Hughs 11/11/2020 -Colonoscopy to rule out IBD and to rule out colorectal cancer.  -Further recommendations to be determined after colonoscopy completed -If colonoscopy negative, to consider CTAP and urinary 5HIAA to assess for carcinoid syndrome. -Patient to call our office if blood per the rectum recurs or if his diarrhea worsens.  Past/Anticipated interventions by surgeon, if any:   Past/Anticipated interventions by medical oncology, if any:  Dr. Benay Spice 12/12/2020  Weight changes, if any: 20-25 pounds loss.  Bowel/Bladder complaints, if any: Bowels move daily  Nausea / Vomiting, if any: No  Pain issues, if any: Has pain when sitting, frequent position changes.  Any blood per rectum: Continues to have blood in stool, darker red in color.  Appetite:  Good, eating small amounts to due to passing of stool.  SAFETY ISSUES:  Prior radiation? No  Pacemaker/ICD? No  Possible current pregnancy? n/a  Is the patient on methotrexate? No  Current Complaints/Details:

## 2020-12-12 ENCOUNTER — Other Ambulatory Visit: Payer: Self-pay

## 2020-12-12 ENCOUNTER — Ambulatory Visit
Admission: RE | Admit: 2020-12-12 | Discharge: 2020-12-12 | Disposition: A | Discharge status: Home / Self Care | Payer: Medicare Other | Source: Ambulatory Visit | Attending: Radiation Oncology | Admitting: Radiation Oncology

## 2020-12-12 ENCOUNTER — Inpatient Hospital Stay: Payer: Medicare Other | Attending: Oncology | Admitting: Oncology

## 2020-12-12 ENCOUNTER — Ambulatory Visit: Payer: Medicare Other | Admitting: Nurse Practitioner

## 2020-12-12 ENCOUNTER — Encounter: Payer: Self-pay | Admitting: Oncology

## 2020-12-12 VITALS — BP 133/91 | HR 91 | Temp 98.0°F | Resp 20 | Ht 66.0 in | Wt 140.4 lb

## 2020-12-12 DIAGNOSIS — K6289 Other specified diseases of anus and rectum: Secondary | ICD-10-CM | POA: Diagnosis not present

## 2020-12-12 DIAGNOSIS — C20 Malignant neoplasm of rectum: Secondary | ICD-10-CM

## 2020-12-12 MED ORDER — TRAMADOL HCL 50 MG PO TABS: 50.0000 mg | ORAL_TABLET | Freq: Two times a day (BID) | ORAL | 0 refills | Status: DC | PRN

## 2020-12-12 NOTE — Progress Notes (Signed)
[facility] Patient Consult   Requesting MD: Milus Banister, Md [address],  [place] [address]   Phillip Brooks 65 y.o.  09-Feb-1956    Reason for Consult: Rectal Cancer   HPI: Phillip Brooks was admitted to [facility] in December 2021 with diarrhea.  He was noted to have mild anemia.  He was referred to Dr. Ardis Hughs and was taken to a colonoscopy on 12/06/2020.  2 sessile polyps were found in the sigmoid and transverse colon.  The polyps were removed.  A partially obstructing mass was noted in the rectosigmoid region beginning at 7 cm from the anal verge.  The mass was biopsied and the area was tattooed.  The pathology revealed tubular adenomas at the transverse and sigmoid colon.  The biopsy of the rectosigmoid mass returned as adenocarcinoma.  He underwent CTs of the chest, abdomen, and pelvis on 12/10/2020.  Fine centrilobular pulmonary nodules are concentrated at the apices with a right apex nodule measuring up to 6 mm.  No liver abnormality.  A large mass was noted at the rectosigmoid junction measuring 7.8 x 5.4 x 4.4 cm.  There are enlarged perirectal lymph nodes.  The inferior margin of the mass was measured at 9 cm from the anal verge.  Prominent, subcentimeter bilateral iliac and pelvic sidewall nodes are new compared to a CT from May 2018.  The pulmonary nodules were felt to be sequelae of prior infection or inflammation.  He is scheduled to see Dr. Marcello Moores on 12/16/2020.  He has an appointment in radiation oncology later today.  Past Medical History:  Diagnosis Date  . Allergy   . Seasonal allergies     Past Surgical History:  Procedure Laterality Date  . TONSILLECTOMY    . WRIST SURGERY-following fracture      Medications: Reviewed  Allergies: No Known Allergies  Family history: His maternal grandfather had lung cancer.  A sister had "cancer ".  He is not aware of a history of colorectal or other specific cancers in the family  Social History:    He lives with a roommate in [place].  He most recently worked detailing cars.  He does not use cigarettes.  He chews tobacco.  He is a moderate beer drinker.  He has 3 children.  ROS:   Positives include: Rectal pain in the evening, fecal incontinence while urinating, weight loss-avoids eating due to difficulty with bowels, rectal bleeding  A complete ROS was otherwise negative.  Physical Exam:  Blood pressure (!) 133/91, pulse 91, temperature 98 F (36.7 C), temperature source Tympanic, resp. rate 20, height 5\' 6"  (1.676 m), weight 140 lb 6.4 oz (63.7 kg), SpO2 100 %.  HEENT: Neck without mass Lungs: Clear bilaterally Cardiac: Regular rate and rhythm Abdomen: No mass, nontender, no hepatosplenomegaly GU: Uncircumcised male, testes without mass Vascular: No leg edema Lymph nodes: "Shotty "bilateral low cervical/scalene nodes, no axillary or inguinal nodes Neurologic: Alert and oriented, the motor exam appears intact in the upper and lower extremities bilaterally Skin: No rash Musculoskeletal: No spine tenderness   LAB:  CBC  Lab Results  Component Value Date   WBC 8.8 12/06/2020   HGB 12.0 (L) 12/06/2020   HCT 36.8 (L) 12/06/2020   MCV 87.7 12/06/2020   PLT 433.0 (H) 12/06/2020   NEUTROABS 3.1 10/21/2020        CMP  Lab Results  Component Value Date   NA 134 (L) 12/10/2020   K 3.8 12/10/2020  CL 101 12/10/2020   CO2 24 12/10/2020   GLUCOSE 87 12/10/2020   BUN 14 12/10/2020   CREATININE 0.98 12/10/2020   CALCIUM 8.8 12/10/2020   PROT 7.5 12/10/2020   ALBUMIN 3.5 12/10/2020   AST 12 12/10/2020   ALT 7 12/10/2020   ALKPHOS 61 12/10/2020   BILITOT 0.3 12/10/2020   GFRNONAA >60 10/06/2020   GFRAA >60 06/29/2020   CEA on 12/06/2020-3.8    Imaging:  CT images from 12/10/2020 reviewed with Phillip Brooks   Assessment/Plan:   1. Rectal cancer  Colonoscopy 12/06/2020-partially obstructing mass beginning at 7 cm from the anal verge-biopsy  adenocarcinoma  CTs 12/10/2020-large circumferential fungating mass at the rectosigmoid junction beginning at 9 cm from the anal verge, enlarged perirectal lymph nodes, prominent bilateral iliac and pelvic sidewall nodes-new compared to a CT from 2018, no evidence of metastatic disease to the chest, pulmonary nodules at the right apex-nonspecific  2. Pain secondary #1 3. Weight loss   Disposition:   Phillip Brooks has been diagnosed with rectal cancer.  He appears to have clinical stage III disease based on the staging evaluation to date.  I discussed the treatment of rectal cancer with Phillip Brooks.  He understands treatment will involve a multidisciplinary approach.  He is scheduled to see Dr. Lisbeth Renshaw later today and Dr. Marcello Moores on 12/16/2020.  My initial impression is to recommend total neoadjuvant therapy in his case.  This will include 8 cycles of FOLFOX to be followed by capecitabine/radiation and then surgery.  He will be referred for a staging pelvic MRI.  I will present his case at the GI tumor conference.  He will return for an office visit and further discussion next week.  I gave him a prescription for tramadol to use as needed for rectal pain.    Betsy Coder, MD  12/12/2020, 2:48 PM

## 2020-12-12 NOTE — Progress Notes (Signed)
Received referral from social worker regarding food and gas assistance and grant.  Message forwarded to Radiation financialAilene Ravel and [employer]) as well.

## 2020-12-12 NOTE — Progress Notes (Signed)
Patient presents to office unaccompanied for new patient visit. He reports weight loss over last several months with diarrhea and frequent small stools w/some cramping. Also had noted blood in stool. He is divorced and lives with a roommate. 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 He has 2 sons and 1 daughter. Closest child [place] in [place] area, 1 in [place]. [place] and 1 in [place]. He has a car that runs. Will make referral for CSW and dietician for food insecurity, diet education and possible gas card for when his treatments start.

## 2020-12-12 NOTE — Research (Signed)
EXACT SCIENCES 2018-01 STUDY; BLOOD SAMPLE COLLECTION TO EVALUATE BIOMARKERS IN SUBJECTS WITH UNTREATED SOLID TUMORS.   Patient Phillip Brooks was identified by Dr. Benay Spice as a potential candidate for the above listed study.  This Clinical Research Nurse met with [name], [id number], on 12/12/20 in a manner and location that ensures patient privacy to discuss participation in the above listed research study.  Patient is Unaccompanied.  A copy of the informed consent document with embedded HIPAA language was provided to the patient.  Patient reads, speaks, and understands [place].    Patient was provided with the business card of this Nurse and encouraged to contact the research team with any questions.  Approximately 15 minutes were spent with the patient reviewing the informed consent documents. Patient verbalizes interest in participating but we are unable to collect a blood specimen today since his biopsy was completed 12/06/20. Patient was provided the option of taking informed consent documents home to review and was encouraged to review at their convenience with their support network, including other care providers. Patient took the consent documents home to review.   Current plan is for [name] to review the consent form in its entirety while I review his schedule for a date/time that would be convenient to sign consent, complete the history worksheet, and provide a specimen (ideally on a day other labs are also being drawn to avoid an additional stick). Ante agrees with this plan and understands that I will call him to schedule a visit once his future appointments are scheduled.   Eligibility has been confirmed by myself and clinical research coordinator Carol Ada but will be reviewed again prior to blood collection. This type of cancer is currently not eligible for participation in the stool sub-study and is therefore not offered or discussed.  [name]. Sharlett Iles, BSN, RN,  CIC 12/12/2020 3:55 PM

## 2020-12-12 NOTE — Progress Notes (Signed)
Met with patient who presents alone to his initial medical oncology consult today with Dr. Julieanne Manson.  I explained my role as nurse navigator and he was given my card with my direct contact information.  He has been shown a model of a port a cath.  I have written down his appointment information for Monday 12/16/20 at 9:40 with Dr. Marcello Moores to arrive by 9:15 at [facility].  He was provided their address.  He is aware that Dr. Benay Spice is ordering an MRI abdomen and he will be receiving a call from Central Scheduling with a date/time.  He will also receive a call regarding his follow up with Dr. Benay Spice towards the end of next week.  He has been referred to CSW and nutrition by Merceda Elks RN.  I have message Dr. Manon Hilding nurse to ask that they plan for port a cath placement.  I escorted Mr. Granade to Radiation Oncology for his consult with Dr. Kyung Rudd.

## 2020-12-13 ENCOUNTER — Telehealth: Payer: Self-pay | Admitting: Oncology

## 2020-12-13 ENCOUNTER — Encounter: Payer: Self-pay | Admitting: General Practice

## 2020-12-13 NOTE — Telephone Encounter (Signed)
Scheduled appt per 2/24 sch msg - left message for pt with appt date and time

## 2020-12-13 NOTE — Progress Notes (Addendum)
Radiation Oncology         [phone] ________________________________  Name: Phillip Brooks        MRN: [id number]  Date of Service: 12/12/2020 DOB: [birth year]  KW:IOXBDZ, Provider Not In  Ladell Pier, MD     REFERRING PHYSICIAN: Ladell Pier, MD   DIAGNOSIS: The encounter diagnosis was Rectal adenocarcinoma [facility]).   HISTORY OF PRESENT ILLNESS: Phillip Brooks is a 65 y.o. male seen at the request of Dr. Rondel Oh for new diagnosis of rectal carcinoma.  The patient had been experiencing ongoing hematochezia and approximately 20 to 25 pounds of weight loss in the last few months, after having present persistent bleeding in his stool, he presented to the emergency department on 10/05/2020, he underwent plain film x-rays that were negative for acute findings.  He was encouraged to be evaluated but after being seen in the follow-up resident clinic he was referred to GI, he ultimately underwent colonoscopy on 12/06/2020 which revealed a circumferential ulcerated partially obstructing clearly malignant appearing mass in the rectosigmoid region the mass was 9 cm long but 7 cm above the anal verge.  Biopsies were obtained and were consistent with adenocarcinoma.  CT imaging of the chest abdomen pelvis on 12/10/2020 reveal a large circumferential ulcerated fungating mass of the rectosigmoid junction measuring 7.8 x 5.4 x 4.4 cm with  enlarged perirectal lymph nodes, measuring up to 1 cm.  There are prominent bilateral iliac and pelvic sidewall nodes and no evidence of metastatic disease.  He does have nonspecific lung nodules in the posterior right apex and mild diffuse bronchial wall thickening consistent with bronchiolitis and mild prostatomegaly.  He is getting set up for an MRI of the pelvis and has met with Dr. Benay Spice who is recommended total neoadjuvant chemotherapy.  He is seen today to discuss chemoradiation at the appropriate interval.     PREVIOUS RADIATION THERAPY: No   PAST  MEDICAL HISTORY:  Past Medical History:  Diagnosis Date  . Allergy   . Seasonal allergies        PAST SURGICAL HISTORY: Past Surgical History:  Procedure Laterality Date  . TONSILLECTOMY    . WRIST SURGERY       FAMILY HISTORY:  Family History  Problem Relation Age of Onset  . Hypertension Mother   . Heart attack Father   . Cancer Sister   . Stomach cancer Neg Hx   . Colon cancer Neg Hx   . Esophageal cancer Neg Hx   . Pancreatic cancer Neg Hx   . Rectal cancer Neg Hx      SOCIAL HISTORY:  reports that he has never smoked. His smokeless tobacco use includes snuff and chew. He reports current alcohol use. He reports that he does not use drugs.  The patient is divorced and lives in [place].  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   ALLERGIES: Patient has no known allergies.   MEDICATIONS:  Current Outpatient Medications  Medication Sig Dispense Refill  . acetaminophen (TYLENOL) 650 MG CR tablet Take 650 mg by mouth every 8 (eight) hours as needed for pain.    . diphenhydrAMINE HCl (BENADRYL ALLERGY PO) Take by mouth.    . traMADol (ULTRAM) 50 MG tablet Take 1 tablet (50 mg total) by mouth every 12 (twelve) hours as needed. Do not drive while taking Tramadol 30 tablet 0  .  Cromolyn Sodium (NASAL ALLERGY NA) Place into the nose. (Patient not taking: No sig reported)    . hyoscyamine (LEVSIN SL) 0.125 MG SL tablet Place 1 tablet (0.125 mg total) under the tongue every 3 (three) hours as needed (moderate lower abdominal pain). Take 1-2 tablets every 3-4 hours as needed for moderate lower abd pain (Patient not taking: No sig reported) 60 tablet 3   No current facility-administered medications for this encounter.     REVIEW OF SYSTEMS: On review of systems, the patient reports that he is doing pretty well overall.  He does have some pelvic pain when he is sitting and  has to shift from one hip to the other at times during our conversation.  He states that he continues to have rectal urgency with intermittent blood in his stool.  He has lost approximately 18 pounds in the last 3 months, probably 20 to 25 pounds in the last 6 months.  He reports that he is not having any shortness of breath or chest pain.  He reports his appetite however is good and he denies any abdominal or nausea or vomiting.  No other complaints are verbalized.     PHYSICAL EXAM:  Wt Readings from Last 3 Encounters:  12/12/20 140 lb 6.4 oz (63.7 kg)  12/06/20 137 lb (62.1 kg)  11/11/20 137 lb (62.1 kg)   Temp Readings from Last 3 Encounters:  12/12/20 98 F (36.7 C) (Tympanic)  12/06/20 (!) 97.5 F (36.4 C) (Temporal)  10/21/20 97.8 F (36.6 C) (Oral)   BP Readings from Last 3 Encounters:  12/12/20 (!) 133/91  12/06/20 90/68  11/11/20 124/80   Pulse Readings from Last 3 Encounters:  12/12/20 91  12/06/20 74  11/11/20 (!) 104      In general this is a well appearing African-American male in no acute distress.  He's alert and oriented x4 and appropriate throughout the examination. Cardiopulmonary assessment is negative for acute distress and he exhibits normal effort.    ECOG = 1  0 - Asymptomatic (Fully active, able to carry on all predisease activities without restriction)  1 - Symptomatic but completely ambulatory (Restricted in physically strenuous activity but ambulatory and able to carry out work of a light or sedentary nature. For example, light housework, office work)  2 - Symptomatic, <50% in bed during the day (Ambulatory and capable of all self care but unable to carry out any work activities. Up and about more than 50% of waking hours)  3 - Symptomatic, >50% in bed, but not bedbound (Capable of only limited self-care, confined to bed or chair 50% or more of waking hours)  4 - Bedbound (Completely disabled. Cannot carry on any self-care. Totally confined to  bed or chair)  5 - Death   Eustace Pen MM, Creech RH, Tormey DC, et al. [phone]). "Toxicity and response criteria of the [facility]". [place] Oncol. 5 (6): 649-55    LABORATORY DATA:  Lab Results  Component Value Date   WBC 8.8 12/06/2020   HGB 12.0 (L) 12/06/2020   HCT 36.8 (L) 12/06/2020   MCV 87.7 12/06/2020   PLT 433.0 (H) 12/06/2020   Lab Results  Component Value Date   NA 134 (L) 12/10/2020   K 3.8 12/10/2020   CL 101 12/10/2020   CO2 24 12/10/2020   Lab Results  Component Value Date   ALT 7 12/10/2020   AST 12 12/10/2020   ALKPHOS 61 12/10/2020   BILITOT 0.3 12/10/2020  RADIOGRAPHY: CT CHEST ABDOMEN PELVIS W CONTRAST  Result Date: 12/11/2020 CLINICAL DATA:  Weight loss, anemia, abnormal colonoscopy, rectosigmoid mass identified by colonoscopy EXAM: CT CHEST, ABDOMEN, AND PELVIS WITH CONTRAST TECHNIQUE: Multidetector CT imaging of the chest, abdomen and pelvis was performed following the standard protocol during bolus administration of intravenous contrast. CONTRAST:  171m OMNIPAQUE IOHEXOL 300 MG/ML SOLN, additional oral enteric contrast COMPARISON:  CT abdomen pelvis, 03/06/2017 FINDINGS: CT CHEST FINDINGS Cardiovascular: Scattered aortic atherosclerosis. Normal heart size. No pericardial effusion. Mediastinum/Nodes: No enlarged mediastinal, hilar, or axillary lymph nodes. Thyroid gland, trachea, and esophagus demonstrate no significant findings. Lungs/Pleura: Mild, diffuse bilateral bronchial wall thickening. Background of fine centrilobular pulmonary nodules most concentrated in the lung apices. Pulmonary nodules in the posterior right apex measuring up to 6 mm (series 6, image 26). No pleural effusion or pneumothorax. Musculoskeletal: No chest wall mass or suspicious bone lesions identified. CT ABDOMEN PELVIS FINDINGS Hepatobiliary: No solid liver abnormality is seen. No gallstones, gallbladder wall thickening, or biliary dilatation. Pancreas:  Unremarkable. No pancreatic ductal dilatation or surrounding inflammatory changes. Spleen: Normal in size without significant abnormality. Adrenals/Urinary Tract: Adrenal glands are unremarkable. Kidneys are normal, without renal calculi, solid lesion, or hydronephrosis. Bladder is unremarkable. Stomach/Bowel: Stomach is within normal limits. Appendix appears normal. There is a large, circumferential ulcerated and fungating mass of the rectosigmoid junction, overall dimensions measuring at least 7.8 x 5.4 x 4.4 cm (series 2, image 92, series 5, image 74). There are enlarged perirectal lymph nodes measuring up to 1.0 x 0.8 cm (series 2, image 93). The inferior margin of this mass lies approximately 9 cm superior to the anal verge. Vascular/Lymphatic: No significant vascular findings are present. There are prominent, although subcentimeter bilateral iliac and pelvic sidewall lymph nodes, which are new compared to prior examination dated 03/06/2017 and left iliac nodes measuring up to 1.5 x 0.7 cm (series 2, image 89, 92, 90). Reproductive: Mild prostatomegaly. Other: No abdominal wall hernia or abnormality. No abdominopelvic ascites. Musculoskeletal: No acute or significant osseous findings. IMPRESSION: 1. There is a large, circumferential ulcerated and fungating mass of the rectosigmoid junction, overall dimensions measuring at least 7.8 x 5.4 x 4.4 cm, consistent with malignant mass identified by colonoscopy. 2. There are enlarged perirectal lymph nodes measuring up to 1.0 x 0.8 cm. 3. There are prominent, although subcentimeter bilateral iliac and pelvic sidewall lymph nodes, which are new compared to prior examination dated 03/06/2017. These are nonspecific although concerning for nodal metastatic disease. 4. No definitive evidence of metastatic disease in the chest, abdomen, or pelvis. 5. Pulmonary nodules in the posterior right apex measuring up to 6 mm, nonspecific, but most likely incidental sequelae of prior  infection or inflammation given appearance and distribution. Attention on follow-up. 6. Mild, diffuse bilateral bronchial wall thickening with a background of fine centrilobular pulmonary nodules most concentrated in the lung apices, consistent with smoking-related respiratory bronchiolitis. 7. Mild prostatomegaly. Aortic Atherosclerosis (ICD10-I70.0). Electronically Signed   By: AEddie CandleM.D.   On: 12/11/2020 08:31       IMPRESSION/PLAN: 1. Adenocarcinoma of the rectum.  Dr. MLisbeth Renshawagrees with the work-up thus far and with Dr. CLoma Newtonplans for MRI scan to determine extent of disease.  The patient has met with Dr. SBenay Spicealready and plans to begin total neoadjuvant chemotherapy.  We discussed that approximately 4 months from now we would recommend proceeding with chemoradiation followed by surgical resection.  The patient will be seeing Dr. TMarcello Mooresnext week to discuss Port-A-Cath placement  and subsequent surgery following neoadjuvant treatment.  He is in agreement with this plan.  We reviewed the risks, benefits, long and short-term effects of radiotherapy, as well as the delivery and logistics of treatment.  The patient is in agreement and would like to proceed at the appropriate time.  Dr. Lisbeth Renshaw recommends a course of 5 and half weeks of chemoradiation.  We will plan to see the patient back a few months from now to coordinate his treatment.  In a visit lasting 60 minutes, greater than 50% of the time was spent face to face discussing the patient's condition, in preparation for the discussion, and coordinating the patient's care.   The above documentation reflects my direct findings during this shared patient visit. Please see the separate note by Dr. Lisbeth Renshaw on this date for the remainder of the patient's plan of care.    Carola Rhine, [facility]   **Disclaimer: This note was dictated with voice recognition software. Similar sounding words can inadvertently be transcribed and this note may  contain transcription errors which may not have been corrected upon publication of note.**

## 2020-12-13 NOTE — Progress Notes (Signed)
[place] CSW Progress Notes  Referral received w patient concerns re food and gas, may need help getting to/from radiation treatment.  Called patient, no answer, left VM w my contact information and encouragement to call back.  Have also notified [employer] of the need.  Edwyna Shell, LCSW Clinical Social Worker Phone:  [phone]

## 2020-12-13 NOTE — Telephone Encounter (Signed)
Scheduled appointment per 2/24 los. Spoke to patient who is aware of appointment date and time.

## 2020-12-17 ENCOUNTER — Telehealth: Payer: Self-pay

## 2020-12-17 ENCOUNTER — Ambulatory Visit: Payer: Self-pay | Admitting: General Surgery

## 2020-12-17 ENCOUNTER — Encounter: Payer: Self-pay | Admitting: General Practice

## 2020-12-17 ENCOUNTER — Inpatient Hospital Stay: Payer: Medicare HMO | Attending: Oncology

## 2020-12-17 DIAGNOSIS — K6289 Other specified diseases of anus and rectum: Secondary | ICD-10-CM

## 2020-12-17 DIAGNOSIS — Z79899 Other long term (current) drug therapy: Secondary | ICD-10-CM | POA: Insufficient documentation

## 2020-12-17 DIAGNOSIS — Z5111 Encounter for antineoplastic chemotherapy: Secondary | ICD-10-CM | POA: Insufficient documentation

## 2020-12-17 DIAGNOSIS — C20 Malignant neoplasm of rectum: Secondary | ICD-10-CM | POA: Diagnosis not present

## 2020-12-17 DIAGNOSIS — C19 Malignant neoplasm of rectosigmoid junction: Secondary | ICD-10-CM | POA: Insufficient documentation

## 2020-12-17 DIAGNOSIS — G893 Neoplasm related pain (acute) (chronic): Secondary | ICD-10-CM | POA: Insufficient documentation

## 2020-12-17 DIAGNOSIS — R634 Abnormal weight loss: Secondary | ICD-10-CM | POA: Insufficient documentation

## 2020-12-17 NOTE — H&P (Signed)
  The patient is a 65 year old male who presents with colorectal cancer. 65 year old male who presents to the office for evaluation of rectal cancer. Patient was noticing rectal bleeding and weight loss. He underwent colonoscopy which showed a mid rectal mass approximately 7 cm from the anal verge extending to the rectosigmoid junction. Biopsy showed adenocarcinoma. The mass was tattooed distally. CT scans of the chest, abdomen and pelvis show no signs of metastatic disease, but locally advanced rectal tumor. He has not had an MRI completed. He has seen Dr. Ammie Dalton and Dr. Lisbeth Renshaw and total neoadjuvant chemotherapy has been recommended.    Past Surgical History Mammie Lorenzo, LPN; 6/0/6301 60:10 AM) Colon Polyp Removal - Colonoscopy  Diagnostic Studies History Mammie Lorenzo, LPN; 06/21/2354 73:22 AM) Colonoscopy 5-10 years ago  Allergies Mammie Lorenzo, LPN; 0/11/5425 06:23 AM) No Known Drug Allergies [12/17/2020]: Allergies Reconciled  Medication History Mammie Lorenzo, LPN; 04/23/2830 51:76 AM) Tylenol 8 Hour (650MG  Tablet ER, Oral) Active. Cromolyn Sodium (4% Aerosol Soln, Nasal) Active. Benadryl (25MG  Tablet, Oral) Active. Levsin (0.125MG  Tablet, Oral) Active. traMADol HCl (50MG  Tablet, Oral) Active. Medications Reconciled  Social History Mammie Lorenzo, LPN; 10/25/735 10:62 AM) Alcohol use Moderate alcohol use. Caffeine use Carbonated beverages. Illicit drug use Remotely quit drug use. Tobacco use Never smoker.  Family History Mammie Lorenzo, LPN; 03/27/4853 62:70 AM) Family history unknown First Degree Relatives  Other Problems Mammie Lorenzo, LPN; 12/22/91 81:82 AM) No pertinent past medical history     Review of Systems Mammie Lorenzo LPN; 06/28/3715 96:78 AM) Cardiovascular Not Present- Chest Pain, Difficulty Breathing Lying Down, Leg Cramps, Palpitations, Rapid Heart Rate, Shortness of Breath and Swelling of Extremities. Gastrointestinal Present- Bloody  Stool. Not Present- Abdominal Pain, Bloating, Change in Bowel Habits, Chronic diarrhea, Constipation, Difficulty Swallowing, Excessive gas, Gets full quickly at meals, Hemorrhoids, Indigestion, Nausea, Rectal Pain and Vomiting. Male Genitourinary Not Present- Blood in Urine, Change in Urinary Stream, Frequency, Impotence, Nocturia, Painful Urination, Urgency and Urine Leakage.  Vitals Claiborne Billings Dockery LPN; 06/21/8100 75:10 AM) 12/17/2020 10:04 AM Weight: 140 lb Height: 66in Body Surface Area: 1.72 m Body Mass Index: 22.6 kg/m  Pulse: 95 (Regular)  BP: 122/78(Sitting, Left Arm, Standard)        Physical Exam Leighton Ruff MD; 11/23/8525 10:31 AM)  General Mental Status-Alert. General Appearance-Cooperative.  Abdomen Palpation/Percussion Palpation and Percussion of the abdomen reveal - Soft and Non Tender.    Assessment & Plan Leighton Ruff MD; 04/25/2422 10:28 AM)  RECTAL CANCER (C20) Impression: 65 year old male who was recently diagnosed with a mid rectal cancer. The distal edge was noted to be approximately 7 cm from the anal verge by colonoscopy. CT scan showed no signs of metastatic disease, but probable nodal disease. We will await his MRI to evaluate for surgical resection. In the meantime, we will place a port so that he can start total neoadjuvant chemotherapy.  Port placement was discussed in detail. We discussed risks such as bleeding, infection, damage to adjacent structures, and device malfunction. All questions were answered. We will proceed with this as soon as possible.

## 2020-12-17 NOTE — H&P (View-Only) (Signed)
  The patient is a 65 year old male who presents with colorectal cancer. 66 year old male who presents to the office for evaluation of rectal cancer. Patient was noticing rectal bleeding and weight loss. He underwent colonoscopy which showed a mid rectal mass approximately 7 cm from the anal verge extending to the rectosigmoid junction. Biopsy showed adenocarcinoma. The mass was tattooed distally. CT scans of the chest, abdomen and pelvis show no signs of metastatic disease, but locally advanced rectal tumor. He has not had an MRI completed. He has seen Dr. Ammie Dalton and Dr. Lisbeth Renshaw and total neoadjuvant chemotherapy has been recommended.    Past Surgical History Mammie Lorenzo, LPN; 10/24/1094 04:54 AM) Colon Polyp Removal - Colonoscopy  Diagnostic Studies History Mammie Lorenzo, LPN; 0/06/8118 14:78 AM) Colonoscopy 5-10 years ago  Allergies Mammie Lorenzo, LPN; 11/28/5619 30:86 AM) No Known Drug Allergies [12/17/2020]: Allergies Reconciled  Medication History Mammie Lorenzo, LPN; 02/23/8468 62:95 AM) Tylenol 8 Hour (650MG  Tablet ER, Oral) Active. Cromolyn Sodium (4% Aerosol Soln, Nasal) Active. Benadryl (25MG  Tablet, Oral) Active. Levsin (0.125MG  Tablet, Oral) Active. traMADol HCl (50MG  Tablet, Oral) Active. Medications Reconciled  Social History Mammie Lorenzo, LPN; 11/26/4130 44:01 AM) Alcohol use Moderate alcohol use. Caffeine use Carbonated beverages. Illicit drug use Remotely quit drug use. Tobacco use Never smoker.  Family History Mammie Lorenzo, LPN; 0/11/7251 66:44 AM) Family history unknown First Degree Relatives  Other Problems Mammie Lorenzo, LPN; 0/12/4740 59:56 AM) No pertinent past medical history     Review of Systems Mammie Lorenzo LPN; 12/25/7562 33:29 AM) Cardiovascular Not Present- Chest Pain, Difficulty Breathing Lying Down, Leg Cramps, Palpitations, Rapid Heart Rate, Shortness of Breath and Swelling of Extremities. Gastrointestinal Present- Bloody  Stool. Not Present- Abdominal Pain, Bloating, Change in Bowel Habits, Chronic diarrhea, Constipation, Difficulty Swallowing, Excessive gas, Gets full quickly at meals, Hemorrhoids, Indigestion, Nausea, Rectal Pain and Vomiting. Male Genitourinary Not Present- Blood in Urine, Change in Urinary Stream, Frequency, Impotence, Nocturia, Painful Urination, Urgency and Urine Leakage.  Vitals Claiborne Billings Dockery LPN; 02/17/8840 66:06 AM) 12/17/2020 10:04 AM Weight: 140 lb Height: 66in Body Surface Area: 1.72 m Body Mass Index: 22.6 kg/m  Pulse: 95 (Regular)  BP: 122/78(Sitting, Left Arm, Standard)        Physical Exam Leighton Ruff MD; 3/0/1601 10:31 AM)  General Mental Status-Alert. General Appearance-Cooperative.  Abdomen Palpation/Percussion Palpation and Percussion of the abdomen reveal - Soft and Non Tender.    Assessment & Plan Leighton Ruff MD; 0/06/3234 10:28 AM)  RECTAL CANCER (C20) Impression: 65 year old male who was recently diagnosed with a mid rectal cancer. The distal edge was noted to be approximately 7 cm from the anal verge by colonoscopy. CT scan showed no signs of metastatic disease, but probable nodal disease. We will await his MRI to evaluate for surgical resection. In the meantime, we will place a port so that he can start total neoadjuvant chemotherapy.  Port placement was discussed in detail. We discussed risks such as bleeding, infection, damage to adjacent structures, and device malfunction. All questions were answered. We will proceed with this as soon as possible.

## 2020-12-17 NOTE — Progress Notes (Signed)
[employer] CSW Progress Notes  Called patient to follow up on Distress Screen - he was driving and requested a call back on Thursday.  CSW made appointment and will call as requested.  Edwyna Shell, LCSW Clinical Social Worker Phone:  [phone]

## 2020-12-17 NOTE — Telephone Encounter (Signed)
Nutrition  Called patient at scheduled phone appointment.  No answer.  Left message with call back number.  Will send message to scheduling to get appointment rescheduled.    Phillip Brooks B. Zenia Resides, [place], [place] Registered Dietitian [phone] (mobile)

## 2020-12-17 NOTE — Telephone Encounter (Signed)
EXACT SCIENCES 2018-01 STUDY; BLOOD SAMPLE COLLECTION TO EVALUATE BIOMARKERS INSUBJECTS WITH UNTREATED SOLID TUMORS.  OUTGOING CALL: Outgoing call to patient ROYALE SWAMY: no answer, left a voicemail message requesting call back to my direct number to confirm interest in voluntary participation in the above study and schedule a potential visit while he is here this Friday, 12/20/20. Awaiting return call.  [name]. Sharlett Iles, BSN, RN, CIC 12/17/2020 4:23 PM

## 2020-12-17 NOTE — Progress Notes (Signed)
Spoke with patient to let him know his MRI is on Sunday 12/22/20 at [facility] to arrive by 8:30 am.  NPO 4 hours prior.  He wrote this appointment down and verbalized an understanding.

## 2020-12-18 ENCOUNTER — Telehealth: Payer: Self-pay | Admitting: Oncology

## 2020-12-18 NOTE — Telephone Encounter (Signed)
Called pt per 3/1 sch msg - no answer . Left message for patient to call back to schedule appt with nutritionist.

## 2020-12-19 ENCOUNTER — Encounter: Payer: Self-pay | Admitting: General Practice

## 2020-12-19 ENCOUNTER — Inpatient Hospital Stay: Payer: Medicare HMO | Admitting: General Practice

## 2020-12-19 ENCOUNTER — Other Ambulatory Visit: Payer: Self-pay

## 2020-12-19 DIAGNOSIS — K6289 Other specified diseases of anus and rectum: Secondary | ICD-10-CM

## 2020-12-19 NOTE — Progress Notes (Signed)
[place] Psychosocial Distress Screening Clinical Social Work  Clinical Social Work was referred by distress screening protocol.  The patient scored a 10 on the Psychosocial Distress Thermometer which indicates severe distress. Clinical Social Worker contacted patient by phone to assess for distress and other psychosocial needs. Staying with roommate, "was with housing years ago" and is trying to get back into subsidized housing.  Does owe a small amount of money to [facility], when he repays them what he owes.  Is keeping his car running.  Would like to get on Meals on Wheels list, referred to [employer].  No further needs at this time. Encouraged him to reach out as needed for help/support.   ONCBCN DISTRESS SCREENING 12/12/2020  Screening Type Initial Screening  Distress experienced in past week (1-10) 10  Practical problem type Housing;Insurance;Food  Physician notified of physical symptoms Yes  Referral to clinical psychology No  Referral to clinical social work Yes  Referral to dietition Yes  Referral to financial advocate No  Referral to support programs No  Referral to palliative care No    Clinical Social Worker follow up needed: No.  If yes, follow up plan:  Beverely Pace, LCSW

## 2020-12-19 NOTE — Telephone Encounter (Signed)
EXACT SCIENCES 2018-01 STUDY; BLOOD SAMPLE COLLECTION TO EVALUATE BIOMARKERS INSUBJECTS WITH UNTREATED SOLID TUMORS.  OUTGOING CALL: Outgoing call to patientCurtis E Brooks: I verified that I was speaking with the correct patient and we spoke for approximately five minutes. Alam verbalizes continued interest in participating in the [employer] blood collection study and states he will be here tomorrow at 1:30pm as scheduled. Vaden is thanked for his time and consideration of participation in this study.  Current plan is to review consent in full tomorrow, sign for voluntary participationif desired, complete the history worksheet and obtain blood specimen. Markis will receive a $50 gift card for participation once all study requirements have been completed.  [name]. Sharlett Iles, BSN, RN, CIC 12/19/2020 1:25 PM

## 2020-12-20 ENCOUNTER — Inpatient Hospital Stay: Payer: Medicare HMO

## 2020-12-20 ENCOUNTER — Other Ambulatory Visit: Payer: Self-pay

## 2020-12-20 ENCOUNTER — Ambulatory Visit: Payer: Medicare Other | Admitting: Nurse Practitioner

## 2020-12-20 ENCOUNTER — Telehealth: Payer: Self-pay

## 2020-12-20 DIAGNOSIS — K6289 Other specified diseases of anus and rectum: Secondary | ICD-10-CM

## 2020-12-20 LAB — RESEARCH LABS

## 2020-12-20 NOTE — Telephone Encounter (Signed)
EXACT SCIENCES 2018-01 STUDY; BLOOD SAMPLE COLLECTION TO EVALUATE BIOMARKERS INSUBJECTS WITH UNTREATED SOLID TUMORS.  OUTGOING CALL: Outgoing call to patient Phillip Brooks: no answer, left a voicemail message requesting call back to my direct number regarding scheduling changes for today. Patient is advised he may still come today at 1:30pm for the [employer] visit or we can reschedule for another day when he has other visits. Current plan is to maintain the current appointments scheduled for this afternoon with research and lab unless [place] calls to reschedule. Awaiting call back.  [name]. Sharlett Iles, BSN, RN, CIC 12/20/2020 9:58 AM

## 2020-12-20 NOTE — Research (Signed)
EXACT SCIENCES 2018-01 STUDY; BLOOD SAMPLE COLLECTION TO EVALUATE BIOMARKERS IN SUBJECTS WITH UNTREATED SOLID TUMORS.   Patient Phillip Brooks was identified by Dr. Benay Spice as a potential candidate for the above listed study.  This Clinical Research Nurse met with Phillip Brooks, [id number] on 12/20/20 along with clinical research nurse Ruben Im in a manner and location that ensures patient privacy to discuss participation in the above listed research study.  Patient is Unaccompanied.  Patient was previously provided with informed consent documents.  Patient confirmed they have read the informed consent documents. but the consent was reviewed again to address any questions or concerns.  As outlined in the informed consent form, this Nurse and Valetta Mole discussed the purpose of the research study, the investigational nature of the study, study procedures and requirements for study participation, potential risks and benefits of study participation, as well as alternatives to participation.  The patient understands participation is voluntary and they may withdraw from study participation at any time.  This study does not involve randomization.  This study does not involve an investigational drug or device. This study does not involve a placebo.Eligibility has been confirmed by myself and clinical research coordinator [employer].  Confidentiality and how the patient's information will be used as part of study participation were discussed.  Patient was informed there is reimbursement provided for their time and effort spent on trial participation.  The patient is encouraged to discuss research study participation with their insurance provider to determine what costs they may incur as part of study participation, including research related injury.    All questions were answered to patient's satisfaction.  The informed consent with embedded HIPAA language was reviewed page by page.  The patient's  mental and emotional status is appropriate to provide informed consent, and the patient verbalizes an understanding of study participation.  Patient has agreed to participate in the above listed research study and has voluntarily signed the informed consent version with embedded HIPAA language, version 3.0 on 12/20/20 at 1:43 PM.  The patient was provided with a copy of the signed informed consent form with embedded HIPAA language for their reference.  No study specific procedures were obtained prior to the signing of the informed consent document.  Approximately 30 minutes were spent with the patient reviewing the informed consent documents.  Patient was not requested to complete a Release of Information form.   HISTORY: Patient verbalizes his sister died of "some kind of cancer" but is unsure what type, possibly breast. An uncle was diagnosed with lung caner. Phillip Brooks denies cigarette, cigar, or pipe use but admits using chewing tobacco (one container per day) for the past year. Patient also reports current alcohol use of a six-pack per day x46 years. Per the [employer] Guideline (version 4.0 dated 26MAR2020), one drink equals 1 12-oz. beer. Six 12oz. Beers per day times seven days a week equals 42 drinks per week.  SPECIMEN COLLECTION: Following interview, research blood specimens were collected per study protocol via peripheral blood draw.  Blood sample kit # [id number] was used for this blood collection. Patient tolerated well without any complaints or adverse effects noted.   GIFT CARD: A $50 [employer] gift card was provided to the patient after blood collection: patient signed for receipt of gift card. Patient was thanked for their time and participation in this study.  [name]. Sharlett Iles, BSN, RN, CIC 12/20/2020 3:07 PM

## 2020-12-20 NOTE — Research (Signed)
EXACT SCIENCES 2018-01 STUDY; BLOOD SAMPLE COLLECTION TO EVALUATE BIOMARKERS INSUBJECTS WITH UNTREATED SOLID TUMORS  12/20/20    11:00AM   This Coordinator has reviewed this patient's inclusion and exclusion criteria as a second review and confirms Phillip Brooks is eligible for study participation.  Patient may continue with enrollment.  Carol Ada, RT(R)(T) Clinical Research Coordinator

## 2020-12-22 ENCOUNTER — Ambulatory Visit (HOSPITAL_COMMUNITY)
Admission: RE | Admit: 2020-12-22 | Discharge: 2020-12-22 | Disposition: A | Discharge status: Home / Self Care | Payer: Medicare HMO | Source: Ambulatory Visit | Attending: Oncology | Admitting: Oncology

## 2020-12-22 ENCOUNTER — Other Ambulatory Visit: Payer: Self-pay

## 2020-12-22 DIAGNOSIS — K6289 Other specified diseases of anus and rectum: Secondary | ICD-10-CM

## 2020-12-22 MED ORDER — GADOBUTROL 1 MMOL/ML IV SOLN: 6.0000 mL | Freq: Once | INTRAVENOUS | Status: DC | PRN

## 2020-12-24 ENCOUNTER — Other Ambulatory Visit: Payer: Self-pay | Admitting: Oncology

## 2020-12-24 ENCOUNTER — Other Ambulatory Visit: Payer: Self-pay

## 2020-12-24 ENCOUNTER — Ambulatory Visit (HOSPITAL_COMMUNITY): Admission: RE | Admit: 2020-12-24 | Payer: Medicare HMO | Source: Ambulatory Visit

## 2020-12-24 ENCOUNTER — Ambulatory Visit (HOSPITAL_COMMUNITY)
Admission: RE | Admit: 2020-12-24 | Discharge: 2020-12-24 | Disposition: A | Discharge status: Home / Self Care | Payer: Medicare HMO | Source: Ambulatory Visit | Attending: Oncology | Admitting: Oncology

## 2020-12-24 DIAGNOSIS — K6289 Other specified diseases of anus and rectum: Secondary | ICD-10-CM | POA: Diagnosis not present

## 2020-12-24 DIAGNOSIS — C218 Malignant neoplasm of overlapping sites of rectum, anus and anal canal: Secondary | ICD-10-CM | POA: Diagnosis not present

## 2020-12-25 ENCOUNTER — Other Ambulatory Visit: Payer: Self-pay

## 2020-12-25 NOTE — Progress Notes (Signed)
The proposed treatment discussed in conference is for discussion purposes only and is not a binding recommendation.  The patients have not been physically examined, or presented with their treatment options.  Therefore, final treatment plans cannot be decided.   

## 2020-12-26 ENCOUNTER — Telehealth: Payer: Self-pay

## 2020-12-26 ENCOUNTER — Other Ambulatory Visit: Payer: Self-pay

## 2020-12-26 DIAGNOSIS — K6289 Other specified diseases of anus and rectum: Secondary | ICD-10-CM

## 2020-12-26 NOTE — Telephone Encounter (Signed)
Added lab appointment per 3/10 schedule message. Patient is aware.

## 2020-12-26 NOTE — Telephone Encounter (Signed)
EXACT SCIENCES 2018-01 STUDY; BLOOD SAMPLE COLLECTION TO EVALUATE BIOMARKERS INSUBJECTS WITH UNTREATED SOLID TUMORS.  OUTGOING CALL: Outgoing call to patientCurtis E Brooks: I verified that I was speaking with the correct patient and we spoke for approximately five minutes. [name] is advised that the [employer] blood specimens collected 12/20/2020 arrived to the lab site damaged in shipment and are not able to be used. I have verified eligibility for a recollection and he remains eligible since treatment has not started yet. His last MRI was completed 12/24/2020. I verified with the study that [name] is eligible for additional compensation for the redraw. After explaining the above information, kyle luppino interest in providing additional specimens for submission. [name] has an existing appointment at the [facility] tomorrow at 11:30am: current plan is to reconsent [name] after that appointment and recollect the study specimens. [name] is thanked for his time and patience and continued participation in this study.  Current plan is to seek second confirmation of eligibility for the redraw, reconsent tomorrow and obtain recollection of blood specimens. [name] will receive a $50 gift card for participation once all study requirements have been completed.  [name]. Sharlett Iles, BSN, RN, CIC 12/26/2020 2:43 PM

## 2020-12-27 ENCOUNTER — Other Ambulatory Visit: Payer: Self-pay

## 2020-12-27 ENCOUNTER — Inpatient Hospital Stay: Payer: Medicare HMO

## 2020-12-27 ENCOUNTER — Inpatient Hospital Stay (HOSPITAL_BASED_OUTPATIENT_CLINIC_OR_DEPARTMENT_OTHER): Payer: Medicare HMO | Admitting: Nurse Practitioner

## 2020-12-27 VITALS — BP 121/76 | HR 67 | Temp 97.3°F | Resp 18 | Ht 66.0 in | Wt 138.4 lb

## 2020-12-27 DIAGNOSIS — G893 Neoplasm related pain (acute) (chronic): Secondary | ICD-10-CM | POA: Diagnosis not present

## 2020-12-27 DIAGNOSIS — Z79899 Other long term (current) drug therapy: Secondary | ICD-10-CM | POA: Diagnosis not present

## 2020-12-27 DIAGNOSIS — C20 Malignant neoplasm of rectum: Secondary | ICD-10-CM

## 2020-12-27 DIAGNOSIS — K6289 Other specified diseases of anus and rectum: Secondary | ICD-10-CM

## 2020-12-27 DIAGNOSIS — R634 Abnormal weight loss: Secondary | ICD-10-CM | POA: Diagnosis not present

## 2020-12-27 DIAGNOSIS — C19 Malignant neoplasm of rectosigmoid junction: Secondary | ICD-10-CM | POA: Diagnosis not present

## 2020-12-27 DIAGNOSIS — Z5111 Encounter for antineoplastic chemotherapy: Secondary | ICD-10-CM | POA: Diagnosis not present

## 2020-12-27 LAB — RESEARCH LABS

## 2020-12-27 MED ORDER — PROCHLORPERAZINE MALEATE 10 MG PO TABS: 10.0000 mg | ORAL_TABLET | Freq: Four times a day (QID) | ORAL | 2 refills | Status: DC | PRN

## 2020-12-27 MED ORDER — LIDOCAINE-PRILOCAINE 2.5-2.5 % EX CREA: TOPICAL_CREAM | CUTANEOUS | 2 refills | Status: DC

## 2020-12-27 NOTE — Progress Notes (Signed)
START ON PATHWAY REGIMEN - Colorectal     A cycle is every 14 days:     Oxaliplatin      Leucovorin      Fluorouracil      Fluorouracil   **Always confirm dose/schedule in your pharmacy ordering system**  Patient Characteristics: Preoperative or Nonsurgical Candidate (Clinical Staging), Rectal, cT3 - cT4, cN0 or Any cT, cN+ Tumor Location: Rectal Therapeutic Status: Preoperative or Nonsurgical Candidate (Clinical Staging) AJCC T Category: cT4b AJCC N Category: cN2 AJCC M Category: cM0 AJCC 8 Stage Grouping: IIIC Intent of Therapy: Curative Intent, Discussed with Patient

## 2020-12-27 NOTE — Progress Notes (Addendum)
  [place] OFFICE PROGRESS NOTE   Diagnosis: Rectal cancer  INTERVAL HISTORY:   Phillip Brooks returns as scheduled.  He continues to note blood, pain with bowel movements.  He has rectal urgency.  He denies fever.  He notes frequent urination.  Appetite varies.  Objective:  Vital signs in last 24 hours:  Blood pressure 121/76, pulse 67, temperature (!) 97.3 F (36.3 C), temperature source Tympanic, resp. rate 18, height 5\' 6"  (1.676 m), weight 138 lb 6.4 oz (62.8 kg), SpO2 100 %.    HEENT: No thrush or ulcers. Resp: Lungs clear bilaterally. Cardio: Regular rate and rhythm. GI: No hepatomegaly. Vascular: No leg edema. Neuro: Alert and oriented. Skin: No rash.   Lab Results:  Lab Results  Component Value Date   WBC 8.8 12/06/2020   HGB 12.0 (L) 12/06/2020   HCT 36.8 (L) 12/06/2020   MCV 87.7 12/06/2020   PLT 433.0 (H) 12/06/2020   NEUTROABS 3.1 10/21/2020    Imaging:  No results found.  Medications: I have reviewed the patient's current medications.  Assessment/Plan: 1. Rectal cancer ? Colonoscopy 12/06/2020-partially obstructing mass beginning at 7 cm from the anal verge-biopsy adenocarcinoma ? CTs 12/10/2020-large circumferential fungating mass at the rectosigmoid junction beginning at 9 cm from the anal verge, enlarged perirectal lymph nodes, prominent bilateral iliac and pelvic sidewall nodes-new compared to a CT from 2018, no evidence of metastatic disease to the chest, pulmonary nodules at the right apex-nonspecific ? MRI pelvis 12/24/2020-T4b, N2; distance from tumor to the internal anal sphincter 7.3 cm; findings of potential contained perforation with tract seen potentially extending through the tumor into the mesorectum.  2. Pain secondary #1 3. Weight loss   Disposition: Phillip Brooks appears stable.  We reviewed the MRI results from 12/24/2020.  He appears to have clinical stage III disease.  Dr. Benay Spice recommends a total neoadjuvant approach  with FOLFOX x8 cycles, radiation/Xeloda then surgery.  Mr. Granlund agrees with this plan.  We reviewed potential toxicities associated with chemotherapy including bone marrow toxicity, nausea, hair loss, allergic reaction.  We reviewed the various forms of neuropathy associated with oxaliplatin including cold sensitivity, peripheral neuropathy, acute laryngopharyngeal dysesthesia and more rare occurrences such as diplopia, ataxia, incontinence, jaw pain.  We reviewed potential toxicities associated with 5-fluorouracil including mouth sores, diarrhea, skin hyperpigmentation, skin rash, increased sensitivity to sun, hand-foot syndrome.  He agrees to proceed.  He will attend a chemotherapy education class.  He understands a Port-A-Cath is required for this regimen.  This will be scheduled with Dr. Marcello Moores.  Prescriptions sent to his pharmacy for Compazine and EMLA cream.  For the pain he will continue tramadol as needed.  He will return for lab, follow-up, cycle 1 FOLFOX 01/07/2021.  He will contact the office in the interim with any problems.  Patient seen with Dr. Benay Spice.    Ned Card ANP/GNP-BC   12/27/2020  12:09 PM  This was a shared visit with Ned Card.  We discussed the staging MRI findings with Phillip Brooks.  His case was presented at the GI tumor conference.  The conference recommendation is to proceed with total neoadjuvant therapy.  We discussed potential toxicities of the FOLFOX regimen with Phillip Brooks.  He will begin FOLFOX after Port-A-Cath placement by Dr. Marcello Moores.  We anticipate cycle 1 FOLFOX to be given on 01/07/2021.  I was present for greater than 50% of today's visit.  I performed medical decision making.  Julieanne Manson, MD

## 2020-12-27 NOTE — Research (Signed)
EXACT SCIENCES 2018-01 STUDY; BLOOD SAMPLE COLLECTION TO EVALUATE BIOMARKERS IN SUBJECTS WITH UNTREATED SOLID TUMORS   Patient Phillip Brooks was identified by Dr. Benay Spice as a potential candidate for the above listed study. Original consent and blood specimen collection occurred 12/20/20; however, per receiving lab, specimens arrived damaged and the study requested recollection. Patient Phillip Brooks was contacted via phone on 12/26/20 and he agreed to recollection. This Clinical Research Nurse met with Phillip Brooks, Phillip Brooks on 12/27/20 along with clinical research nurse Ruben Im in a manner and location that ensures patient privacy to discuss participation in the above listed research study.  Patient is Unaccompanied.  Patient is familiar with informed consent documents.  Patient confirmed they have read the informed consent documents.   As outlined in the informed consent form, this Nurse and Valetta Mole discussed the purpose of the research study, the investigational nature of the study, study procedures and requirements for study participation, potential risks and benefits of study participation, as well as alternatives to participation.  The patient understands participation is voluntary and they may withdraw from study participation at any time.  This study does not involve randomization.  This study does not involve an investigational drug or device. This study does not involve a placebo.Eligibility has been reconfirmed by myself and clinical research coordinator [facility].  Confidentiality and how the patient's information will be used as part of study participation were discussed.  Patient was informed there is reimbursement provided for their time and effort spent on trial participation, specifically for this recollection.    All questions were answered to patient's satisfaction.  The informed consent with embedded HIPAA language was reviewed page by page.  The patient's mental and  emotional status is appropriate to provide informed consent, and the patient verbalizes an understanding of study participation.  Patient has agreed to participate in the above listed research study and has voluntarily signed the informed consent version with embedded HIPAA language, version 3.0 on 12/27/20 at 12:39 PM.  The patient was provided with a copy of the signed informed consent form with embedded HIPAA language for their reference.  No study specific procedures were obtained prior to the signing of the informed consent document.  Approximately 15 minutes were spent with the patient reviewing the informed consent documents.  Patient was not requested to complete a Release of Information form.   SPECIMEN COLLECTION: Following informed consent, research blood specimens were collected per study protocol via peripheral blood draw.  Blood sample kit # [id number] was used for this blood REcollection. Patient tolerated well without any complaints or adverse effects noted.   GIFT CARD: A $50 [employer] gift card was provided to the patient after blood collection: patient signed for receipt of gift card. Patient was thanked for their time and continued participation in this study.  [name]. Sharlett Iles, BSN, RN, CIC 12/27/2020 1:50 PM

## 2020-12-27 NOTE — Research (Signed)
EXACT SCIENCES 2018-01 STUDY; BLOOD SAMPLE COLLECTION TO EVALUATE BIOMARKERS INSUBJECTS WITH UNTREATED SOLID TUMORS This Coordinator has reviewed this patient's inclusion and exclusion criteria as a second review and confirms Phillip Brooks is eligible for study participation.  Patient may continue with enrollment. Foye Spurling, BSN, RN Clinical Research Nurse 12/27/2020

## 2020-12-30 NOTE — Progress Notes (Signed)
Attempted to reach patient to find out why he has not called [facility] (Dr. Manon Hilding office) back to arrange getting his port a cath placed.  I left their office number and my direct number for him to call me back to let me know if there are any barriers to proceeding.  Re-emphasized that he has to have port place prior to initiating his treatment.

## 2020-12-31 ENCOUNTER — Other Ambulatory Visit: Payer: Self-pay | Admitting: Oncology

## 2020-12-31 ENCOUNTER — Other Ambulatory Visit: Payer: Self-pay

## 2020-12-31 ENCOUNTER — Inpatient Hospital Stay: Payer: Medicare HMO

## 2020-12-31 MED ORDER — TRAMADOL HCL 50 MG PO TABS: 50.0000 mg | ORAL_TABLET | Freq: Two times a day (BID) | ORAL | 0 refills | Status: DC | PRN

## 2021-01-01 ENCOUNTER — Other Ambulatory Visit (HOSPITAL_COMMUNITY): Payer: Medicare HMO

## 2021-01-01 NOTE — Progress Notes (Addendum)
COVID Vaccine Completed: Yes Date COVID Vaccine completed: x2 Has received booster: COVID vaccine manufacturer: unknown Date of COVID positive in last 90 days: No  PCP - N/A Cardiologist - N/A  Chest x-ray -  EKG - 07/01/20 in epic Stress Test - N/A ECHO - N/A Cardiac Cath - N/A Pacemaker/ICD device last checked:N/A  Sleep Study - N/A CPAP - N/A  Fasting Blood Sugar - N/A Checks Blood Sugar __N/A___ times a day  Blood Thinner Instructions: N/A Aspirin Instructions:N/A Last Dose:  Activity level:  Can go up a flight of stairs and activities of daily living without stopping and without symptoms        Anesthesia review: N/A  Patient denies shortness of breath, fever, cough and chest pain at PAT appointment   Patient verbalized understanding of instructions that were given to them at the PAT appointment. Patient was also instructed that they will need to review over the PAT instructions again at home before surgery.

## 2021-01-01 NOTE — Progress Notes (Signed)
Pharmacist Chemotherapy Monitoring - Initial Assessment    Anticipated start date: 01/08/21   Regimen:  . Are orders appropriate based on the patient's diagnosis, regimen, and cycle? Yes . Does the plan date match the patient's scheduled date? Yes . Is the sequencing of drugs appropriate? Yes . Are the premedications appropriate for the patient's regimen? Yes . Prior Authorization for treatment is: Approved o If applicable, is the correct biosimilar selected based on the patient's insurance? not applicable  Organ Function and Labs: Marland Kitchen Are dose adjustments needed based on the patient's renal function, hepatic function, or hematologic function? Yes . Are appropriate labs ordered prior to the start of patient's treatment? Yes . Other organ system assessment, if indicated: N/A . The following baseline labs, if indicated, have been ordered: N/A  Dose Assessment: . Are the drug doses appropriate? Yes . Are the following correct: o Drug concentrations Yes o IV fluid compatible with drug Yes o Administration routes Yes o Timing of therapy Yes . If applicable, does the patient have documented access for treatment and/or plans for port-a-cath placement? not applicable . If applicable, have lifetime cumulative doses been properly documented and assessed? yes Lifetime Dose Tracking  No doses have been documented on this patient for the following tracked chemicals: Doxorubicin, Epirubicin, Idarubicin, Daunorubicin, Mitoxantrone, Bleomycin, Oxaliplatin, Carboplatin, Liposomal Doxorubicin  o   Toxicity Monitoring/Prevention: . The patient has the following take home antiemetics prescribed: Prochlorperazine . The patient has the following take home medications prescribed: N/A . Medication allergies and previous infusion related reactions, if applicable, have been reviewed and addressed. No . The patient's current medication list has been assessed for drug-drug interactions with their chemotherapy  regimen. no significant drug-drug interactions were identified on review.  Order Review: . Are the treatment plan orders signed? Yes . Is the patient scheduled to see a provider prior to their treatment? No  I verify that I have reviewed each item in the above checklist and answered each question accordingly.  Larene Beach, [place], 01/01/2021  4:08 PM

## 2021-01-02 ENCOUNTER — Other Ambulatory Visit (HOSPITAL_COMMUNITY)
Admission: RE | Admit: 2021-01-02 | Discharge: 2021-01-02 | Disposition: A | Discharge status: Home / Self Care | Payer: Medicare HMO | Source: Ambulatory Visit | Attending: General Surgery | Admitting: General Surgery

## 2021-01-02 ENCOUNTER — Telehealth: Payer: Self-pay | Admitting: Nurse Practitioner

## 2021-01-02 ENCOUNTER — Encounter (HOSPITAL_COMMUNITY): Payer: Self-pay | Admitting: General Surgery

## 2021-01-02 ENCOUNTER — Other Ambulatory Visit: Payer: Self-pay

## 2021-01-02 DIAGNOSIS — Z01812 Encounter for preprocedural laboratory examination: Secondary | ICD-10-CM | POA: Diagnosis not present

## 2021-01-02 DIAGNOSIS — Z20822 Contact with and (suspected) exposure to covid-19: Secondary | ICD-10-CM | POA: Insufficient documentation

## 2021-01-02 LAB — SARS CORONAVIRUS 2 (TAT 6-24 HRS): SARS Coronavirus 2: NEGATIVE

## 2021-01-02 NOTE — Telephone Encounter (Signed)
I called and spoke with patient regarding his appointments that were rescheduled from 4/5 to 4/6.  He voiced understanding of these changes

## 2021-01-03 ENCOUNTER — Ambulatory Visit (HOSPITAL_COMMUNITY)
Admission: RE | Admit: 2021-01-03 | Discharge: 2021-01-03 | Disposition: A | Discharge status: Home / Self Care | Payer: Medicare HMO | Attending: General Surgery | Admitting: General Surgery

## 2021-01-03 ENCOUNTER — Ambulatory Visit (HOSPITAL_COMMUNITY): Payer: Medicare HMO

## 2021-01-03 ENCOUNTER — Encounter (HOSPITAL_COMMUNITY): Payer: Self-pay | Admitting: General Surgery

## 2021-01-03 ENCOUNTER — Encounter (HOSPITAL_COMMUNITY)
Admission: RE | Disposition: A | Discharge status: Home / Self Care | Payer: Self-pay | Source: Home / Self Care | Attending: General Surgery

## 2021-01-03 ENCOUNTER — Ambulatory Visit (HOSPITAL_COMMUNITY): Payer: Medicare HMO | Admitting: Certified Registered Nurse Anesthetist

## 2021-01-03 DIAGNOSIS — Z452 Encounter for adjustment and management of vascular access device: Secondary | ICD-10-CM | POA: Diagnosis not present

## 2021-01-03 DIAGNOSIS — J302 Other seasonal allergic rhinitis: Secondary | ICD-10-CM | POA: Diagnosis not present

## 2021-01-03 DIAGNOSIS — Z95828 Presence of other vascular implants and grafts: Secondary | ICD-10-CM

## 2021-01-03 DIAGNOSIS — Z419 Encounter for procedure for purposes other than remedying health state, unspecified: Secondary | ICD-10-CM

## 2021-01-03 DIAGNOSIS — C20 Malignant neoplasm of rectum: Secondary | ICD-10-CM | POA: Diagnosis not present

## 2021-01-03 DIAGNOSIS — D630 Anemia in neoplastic disease: Secondary | ICD-10-CM | POA: Diagnosis not present

## 2021-01-03 HISTORY — DX: Malignant neoplasm of rectum: C20

## 2021-01-03 HISTORY — DX: Anemia, unspecified: D64.9

## 2021-01-03 HISTORY — PX: PORTACATH PLACEMENT: SHX2246

## 2021-01-03 SURGERY — INSERTION, TUNNELED CENTRAL VENOUS DEVICE, WITH PORT
Anesthesia: General | Laterality: Right

## 2021-01-03 MED ORDER — PHENYLEPHRINE 40 MCG/ML (10ML) SYRINGE FOR IV PUSH (FOR BLOOD PRESSURE SUPPORT)
PREFILLED_SYRINGE | INTRAVENOUS | Status: AC
  Filled 2021-01-03: qty 20

## 2021-01-03 MED ORDER — MIDAZOLAM HCL 2 MG/2ML IJ SOLN
INTRAMUSCULAR | Status: AC
  Filled 2021-01-03: qty 2

## 2021-01-03 MED ORDER — MIDAZOLAM HCL 5 MG/5ML IJ SOLN
INTRAMUSCULAR | Status: DC | PRN
  Administered 2021-01-03: 2 mg via INTRAVENOUS

## 2021-01-03 MED ORDER — AMISULPRIDE (ANTIEMETIC) 5 MG/2ML IV SOLN: 10.0000 mg | Freq: Once | INTRAVENOUS | Status: DC | PRN

## 2021-01-03 MED ORDER — DEXAMETHASONE SODIUM PHOSPHATE 10 MG/ML IJ SOLN
INTRAMUSCULAR | Status: AC
  Filled 2021-01-03: qty 1

## 2021-01-03 MED ORDER — CEFAZOLIN SODIUM-DEXTROSE 2-4 GM/100ML-% IV SOLN
INTRAVENOUS | Status: AC
  Filled 2021-01-03: qty 100

## 2021-01-03 MED ORDER — DEXAMETHASONE SODIUM PHOSPHATE 10 MG/ML IJ SOLN
INTRAMUSCULAR | Status: DC | PRN
  Administered 2021-01-03: 6 mg via INTRAVENOUS

## 2021-01-03 MED ORDER — ONDANSETRON HCL 4 MG/2ML IJ SOLN
INTRAMUSCULAR | Status: AC
  Filled 2021-01-03: qty 2

## 2021-01-03 MED ORDER — BUPIVACAINE-EPINEPHRINE (PF) 0.25% -1:200000 IJ SOLN
INTRAMUSCULAR | Status: AC
  Filled 2021-01-03: qty 30

## 2021-01-03 MED ORDER — HEPARIN SOD (PORK) LOCK FLUSH 100 UNIT/ML IV SOLN
INTRAVENOUS | Status: DC | PRN
  Administered 2021-01-03: 500 [IU] via INTRAVENOUS

## 2021-01-03 MED ORDER — ACETAMINOPHEN 500 MG PO TABS: 1000.0000 mg | ORAL_TABLET | ORAL | Status: DC

## 2021-01-03 MED ORDER — SODIUM CHLORIDE 0.9% FLUSH: 3.0000 mL | Freq: Two times a day (BID) | INTRAVENOUS | Status: DC

## 2021-01-03 MED ORDER — CEFAZOLIN SODIUM-DEXTROSE 2-4 GM/100ML-% IV SOLN
2.0000 g | INTRAVENOUS | Status: AC
  Administered 2021-01-03: 2 g via INTRAVENOUS
  Filled 2021-01-03: qty 100

## 2021-01-03 MED ORDER — SODIUM CHLORIDE 0.9 % IV SOLN
Freq: Once | INTRAVENOUS | Status: AC
  Filled 2021-01-03: qty 1.2

## 2021-01-03 MED ORDER — FENTANYL CITRATE (PF) 100 MCG/2ML IJ SOLN
INTRAMUSCULAR | Status: DC | PRN
  Administered 2021-01-03: 50 ug via INTRAVENOUS
  Administered 2021-01-03 (×2): 25 ug via INTRAVENOUS

## 2021-01-03 MED ORDER — ONDANSETRON HCL 4 MG/2ML IJ SOLN: 4.0000 mg | Freq: Once | INTRAMUSCULAR | Status: DC | PRN

## 2021-01-03 MED ORDER — LIDOCAINE 2% (20 MG/ML) 5 ML SYRINGE
INTRAMUSCULAR | Status: AC
  Filled 2021-01-03: qty 5

## 2021-01-03 MED ORDER — PROPOFOL 10 MG/ML IV BOLUS
INTRAVENOUS | Status: DC | PRN
  Administered 2021-01-03: 150 mg via INTRAVENOUS

## 2021-01-03 MED ORDER — LIDOCAINE 2% (20 MG/ML) 5 ML SYRINGE
INTRAMUSCULAR | Status: DC | PRN
  Administered 2021-01-03: 80 mg via INTRAVENOUS

## 2021-01-03 MED ORDER — LACTATED RINGERS IV SOLN: INTRAVENOUS | Status: DC

## 2021-01-03 MED ORDER — PHENYLEPHRINE 40 MCG/ML (10ML) SYRINGE FOR IV PUSH (FOR BLOOD PRESSURE SUPPORT)
PREFILLED_SYRINGE | INTRAVENOUS | Status: DC | PRN
  Administered 2021-01-03: 80 ug via INTRAVENOUS

## 2021-01-03 MED ORDER — PROPOFOL 10 MG/ML IV BOLUS
INTRAVENOUS | Status: AC
  Filled 2021-01-03: qty 20

## 2021-01-03 MED ORDER — ORAL CARE MOUTH RINSE: 15.0000 mL | Freq: Once | OROMUCOSAL | Status: AC

## 2021-01-03 MED ORDER — FENTANYL CITRATE (PF) 100 MCG/2ML IJ SOLN
25.0000 ug | INTRAMUSCULAR | Status: DC | PRN
Start: 2021-01-03 — End: 2021-01-03

## 2021-01-03 MED ORDER — CHLORHEXIDINE GLUCONATE 0.12 % MT SOLN
15.0000 mL | Freq: Once | OROMUCOSAL | Status: AC
  Administered 2021-01-03: 15 mL via OROMUCOSAL

## 2021-01-03 MED ORDER — BUPIVACAINE-EPINEPHRINE 0.25% -1:200000 IJ SOLN
INTRAMUSCULAR | Status: DC | PRN
  Administered 2021-01-03: 8 mL

## 2021-01-03 MED ORDER — HEPARIN SOD (PORK) LOCK FLUSH 100 UNIT/ML IV SOLN
INTRAVENOUS | Status: AC
  Filled 2021-01-03: qty 5

## 2021-01-03 MED ORDER — FENTANYL CITRATE (PF) 100 MCG/2ML IJ SOLN
INTRAMUSCULAR | Status: AC
  Filled 2021-01-03: qty 2

## 2021-01-03 MED ORDER — OXYCODONE HCL 5 MG/5ML PO SOLN: 5.0000 mg | Freq: Once | ORAL | Status: DC | PRN

## 2021-01-03 MED ORDER — OXYCODONE HCL 5 MG PO TABS: 5.0000 mg | ORAL_TABLET | Freq: Once | ORAL | Status: DC | PRN

## 2021-01-03 MED ORDER — ONDANSETRON HCL 4 MG/2ML IJ SOLN
INTRAMUSCULAR | Status: DC | PRN
  Administered 2021-01-03: 4 mg via INTRAVENOUS

## 2021-01-03 SURGICAL SUPPLY — 34 items
ADH SKN CLS APL DERMABOND .7 (GAUZE/BANDAGES/DRESSINGS) ×1
APL PRP STRL LF DISP 70% ISPRP (MISCELLANEOUS) ×1
BAG DECANTER FOR FLEXI CONT (MISCELLANEOUS) ×2 IMPLANT
BLADE SURG 15 STRL LF DISP TIS (BLADE) ×1 IMPLANT
BLADE SURG 15 STRL SS (BLADE) ×2
CHLORAPREP W/TINT 26 (MISCELLANEOUS) ×2 IMPLANT
COVER WAND RF STERILE (DRAPES) IMPLANT
DECANTER SPIKE VIAL GLASS SM (MISCELLANEOUS) ×2 IMPLANT
DERMABOND ADVANCED (GAUZE/BANDAGES/DRESSINGS) ×1
DERMABOND ADVANCED .7 DNX12 (GAUZE/BANDAGES/DRESSINGS) ×1 IMPLANT
DRAPE C-ARM 42X120 X-RAY (DRAPES) ×2 IMPLANT
DRAPE LAPAROTOMY TRNSV 102X78 (DRAPES) ×2 IMPLANT
DRSG TEGADERM 4X4.75 (GAUZE/BANDAGES/DRESSINGS) ×1 IMPLANT
ELECT REM PT RETURN 15FT ADLT (MISCELLANEOUS) ×2 IMPLANT
GAUZE 4X4 16PLY RFD (DISPOSABLE) ×2 IMPLANT
GLOVE SURG ENC MOIS LTX SZ6.5 (GLOVE) ×2 IMPLANT
GLOVE SURG UNDER POLY LF SZ7 (GLOVE) ×2 IMPLANT
GOWN STRL REUS W/TWL XL LVL3 (GOWN DISPOSABLE) ×4 IMPLANT
KIT BASIN OR (CUSTOM PROCEDURE TRAY) ×2 IMPLANT
KIT PORT POWER 8FR ISP CVUE (Port) ×1 IMPLANT
KIT TURNOVER KIT A (KITS) ×2 IMPLANT
NDL HYPO 25X1 1.5 SAFETY (NEEDLE) ×1 IMPLANT
NEEDLE HYPO 25X1 1.5 SAFETY (NEEDLE) ×2 IMPLANT
PACK BASIC VI WITH GOWN DISP (CUSTOM PROCEDURE TRAY) ×2 IMPLANT
PENCIL SMOKE EVACUATOR (MISCELLANEOUS) IMPLANT
SPONGE GAUZE 2X2 8PLY STRL LF (GAUZE/BANDAGES/DRESSINGS) ×1 IMPLANT
SUT PROLENE 2 0 SH DA (SUTURE) ×2 IMPLANT
SUT VIC AB 3-0 SH 27 (SUTURE) ×2
SUT VIC AB 3-0 SH 27XBRD (SUTURE) ×1 IMPLANT
SUT VIC AB 4-0 PS2 18 (SUTURE) ×2 IMPLANT
SYR 10ML LL (SYRINGE) ×2 IMPLANT
SYR CONTROL 10ML LL (SYRINGE) ×2 IMPLANT
TOWEL OR 17X26 10 PK STRL BLUE (TOWEL DISPOSABLE) ×2 IMPLANT
TOWEL OR NON WOVEN STRL DISP B (DISPOSABLE) ×2 IMPLANT

## 2021-01-03 NOTE — Anesthesia Preprocedure Evaluation (Addendum)
Anesthesia Evaluation  Patient identified by MRN, date of birth, ID band Patient awake    Reviewed: Allergy & Precautions, NPO status , Patient's Chart, lab work & pertinent test results  Airway Mallampati: II  TM Distance: >3 FB Neck ROM: Full    Dental no notable dental hx.    Pulmonary neg pulmonary ROS,    Pulmonary exam normal breath sounds clear to auscultation       Cardiovascular Exercise Tolerance: Good negative cardio ROS Normal cardiovascular exam Rhythm:Regular Rate:Normal  Most recent EKG reviewed   Neuro/Psych negative neurological ROS  negative psych ROS   GI/Hepatic Neg liver ROS, Rectal cancer    Endo/Other  negative endocrine ROS  Renal/GU negative Renal ROS  negative genitourinary   Musculoskeletal negative musculoskeletal ROS (+)   Abdominal   Peds negative pediatric ROS (+)  Hematology negative hematology ROS (+) anemia ,   Anesthesia Other Findings   Reproductive/Obstetrics negative OB ROS                            Anesthesia Physical Anesthesia Plan  ASA: III  Anesthesia Plan: General   Post-op Pain Management:    Induction:   PONV Risk Score and Plan: 2  Airway Management Planned: LMA  Additional Equipment:   Intra-op Plan:   Post-operative Plan: Extubation in OR  Informed Consent: I have reviewed the patients History and Physical, chart, labs and discussed the procedure including the risks, benefits and alternatives for the proposed anesthesia with the patient or authorized representative who has indicated his/her understanding and acceptance.     Dental advisory given  Plan Discussed with: CRNA, Anesthesiologist and Surgeon  Anesthesia Plan Comments:        Anesthesia Quick Evaluation

## 2021-01-03 NOTE — Anesthesia Postprocedure Evaluation (Signed)
Anesthesia Post Note  Patient: NIRANJAN RUFENER  Procedure(s) Performed: INSERTION PORT-A-CATH ULTRASOUND GUIDED THROUGH THE RIGHT IJ. (Right )     Patient location during evaluation: PACU Anesthesia Type: General Level of consciousness: awake Pain management: pain level controlled Vital Signs Assessment: post-procedure vital signs reviewed and stable Respiratory status: spontaneous breathing and respiratory function stable Cardiovascular status: stable Postop Assessment: no apparent nausea or vomiting Anesthetic complications: no   No complications documented.  Last Vitals:  Vitals:   01/03/21 1400 01/03/21 1415  BP: 132/86 131/89  Pulse: 74 81  Resp: 14 19  Temp:  36.7 C  SpO2: 99% 97%    Last Pain:  Vitals:   01/03/21 1415  TempSrc:   PainSc: 0-No pain                 Merlinda Frederick

## 2021-01-03 NOTE — Interval H&P Note (Signed)
History and Physical Interval Note:  01/03/2021 10:45 AM  Phillip Brooks  has presented today for surgery, with the diagnosis of RECTAL CANCER.  The various methods of treatment have been discussed with the patient and family. After consideration of risks, benefits and other options for treatment, the patient has consented to  Procedure(s) with comments: INSERTION PORT-A-CATH ULTRASOUND GUIDED (N/A) - ROOM 5 STARTING  AT 12:00PM FOR 60 MIN as a surgical intervention.  The patient's history has been reviewed, patient examined, no change in status, stable for surgery.  I have reviewed the patient's chart and labs.  Questions were answered to the patient's satisfaction.     Rosario Adie, MD  [facility]

## 2021-01-03 NOTE — Anesthesia Procedure Notes (Signed)
Procedure Name: LMA Insertion Date/Time: 01/03/2021 12:30 PM Performed by: Victoriano Lain, CRNA Pre-anesthesia Checklist: Patient identified, Emergency Drugs available, Suction available, Patient being monitored and Timeout performed Patient Re-evaluated:Patient Re-evaluated prior to induction Oxygen Delivery Method: Circle system utilized Preoxygenation: Pre-oxygenation with 100% oxygen Induction Type: IV induction Ventilation: Mask ventilation without difficulty LMA: LMA with gastric port inserted LMA Size: 4.0 Number of attempts: 1 Placement Confirmation: positive ETCO2 Tube secured with: Tape Dental Injury: Teeth and Oropharynx as per pre-operative assessment

## 2021-01-03 NOTE — Op Note (Signed)
01/03/2021  1:34 PM  PATIENT:  Phillip Brooks  65 y.o. male  Patient Care Team: System, Provider Not In as PCP - General Jonnie Finner, RN as Oncology Nurse Navigator Ladell Pier, MD as Consulting Physician (Oncology) Gwyndolyn Kaufman, RN as Registered Nurse  PRE-OPERATIVE DIAGNOSIS:  RECTAL CANCER  POST-OPERATIVE DIAGNOSIS:  RECTAL CANCER  PROCEDURE:  Procedure(s): INSERTION PORT-A-CATH ULTRASOUND GUIDED THROUGH THE RIGHT IJ.  SURGEON:  Surgeon(s): Leighton Ruff, MD  ANESTHESIA:   local and MAC  EBL: 38m  Total I/O In: 100 [IV Piggyback:100] Out: -    COUNTS:  YES  PLAN OF CARE: Discharge to home after PACU  PATIENT DISPOSITION:  PACU - hemodynamically stable.  INDICATION: Patient with need for IV chemotherapy. Port-A-Cath placement was requested.   Use of a central venous catheter for intravenous therapy was discussed. Technique of catheter placement using ultrasound and fluoroscopy guidance was discussed. Risks such as bleeding, infection, pneumothorax, catheter occlusion, reoperation, and other risks were discussed. I noted a good likelihood this will help address the problem. Questions were answered. The patient expressed understanding & wishes to proceed.   Findings: Normal-appearing anatomy.   8 [place] port. It goes through the right internal jugular vein   Procedure: Informed consent was confirmed. Patient was brought the operating room and positioned supine. Arms were tucked. The patient underwent deep sedation. Neck and chest were clipped and prepped and draped in a sterile fashion. A surgical timeout confirmed our plan. I placed a field block of local anesthesia on the chest.  I entered into the right internal jugular vein on the first venipuncture using UKoreaguidance. Non-pulsatile blood was returned. Wire was easily passed into the inferior vena cava and confirmed by fluoroscopy. I confirmed placement of the wire in the right side of the chest.   I made an incision in the lateral infraclavicular area and made a subcutaneous pocket. I used a dilator on the wire using Seldinger technique to dilate the tract under fluoroscopy. I placed the catheter into the sheath. I then peeled away the dilator sheath. I tunneled the power port from the puncture site to the chest pocket. I cut the catheter to appropriate length and attached it to the port using the plastic connector. The port was placed into the pocket and secured to the left anterior chest wall using 2-0 Prolene interrupted stitches x2. Catheter flushed well.  Fluoroscopy confirmed the tip in the distal SVC. Catheter aspirated and flushed well. On final fluoro reevaluation the tip seen to be in good position in the distal SVC.  I closed the wounds using 3-0 Vicryl interrupted sutures for the pocket and 4-0 Monocryl stitch was used to close the skin. Dermabond was used on the 2 incisions. CXR will be performed in PACU. Patient should go home later today. Catheter is okay to use.

## 2021-01-03 NOTE — Transfer of Care (Signed)
Immediate Anesthesia Transfer of Care Note  Patient: Phillip Brooks  Procedure(s) Performed: INSERTION PORT-A-CATH ULTRASOUND GUIDED THROUGH THE RIGHT IJ. (Right )  Patient Location: PACU  Anesthesia Type:General  Level of Consciousness: awake, alert  and patient cooperative  Airway & Oxygen Therapy: Patient Spontanous Breathing and Patient connected to face mask oxygen  Post-op Assessment: Report given to RN and Post -op Vital signs reviewed and stable  Post vital signs: Reviewed and stable  Last Vitals:  Vitals Value Taken Time  BP 129/89 01/03/21 1345  Temp 36.9 C 01/03/21 1345  Pulse 74 01/03/21 1352  Resp 16 01/03/21 1352  SpO2 100 % 01/03/21 1352  Vitals shown include unvalidated device data.  Last Pain:  Vitals:   01/03/21 1040  TempSrc:   PainSc: 2       Patients Stated Pain Goal: 4 (47/84/12 8208)  Complications: No complications documented.

## 2021-01-03 NOTE — Discharge Instructions (Signed)
PORT-A-CATH: POST OP INSTRUCTIONS  Always review your discharge instruction sheet given to you by the facility where your surgery was performed.   1. A prescription for pain medication may be given to you upon discharge. Take your pain medication as prescribed, if needed. If narcotic pain medicine is not needed, then you make take acetaminophen (Tylenol) or ibuprofen (Advil) as needed.  2. Take your usually prescribed medications unless otherwise directed. 3. If you need a refill on your pain medication, please contact our office. All narcotic pain medicine now requires a paper prescription.  Phoned in and fax refills are no longer allowed by law.  Prescriptions will not be filled after 5 pm or on weekends.  4. You should follow a light diet for the remainder of the day after your procedure. 5. Most patients will experience some mild swelling and/or bruising in the area of the incision. It may take several days to resolve. 6. It is common to experience some constipation if taking pain medication after surgery. Increasing fluid intake and taking a stool softener (such as Colace) will usually help or prevent this problem from occurring. A mild laxative (Milk of Magnesia or Miralax) should be taken according to package directions if there are no bowel movements after 48 hours.  7. Unless discharge instructions indicate otherwise, you may remove your bandages 48 hours after surgery, and you may shower at that time.  Your surgeon used Dermabond (skin glue) on the incision, you may shower in 24 hours.  The glue will flake off over the next 2-3 weeks.  8. If your port is left accessed at the end of surgery (needle left in port), the dressing cannot get wet and should only by changed by a healthcare professional. When the port is no longer accessed (when the needle has been removed), follow step 7.   9. ACTIVITIES:  Limit activity involving your arms for the next 72 hours. Do no strenuous exercise or activity  for 1 week. You may drive when you are no longer taking prescription pain medication, you can comfortably wear a seatbelt, and you can maneuver your car. 10.You may need to see your doctor in the office for a follow-up appointment.  Please       check with your doctor.  11.When you receive a new Port-a-Cath, you will get a product guide and        ID card.  Please keep them in case you need them.  WHEN TO CALL YOUR DOCTOR [phone]): 1. Fever over 101.0 2. Chills 3. Continued bleeding from incision 4. Increased redness and tenderness at the site 5. Shortness of breath, difficulty breathing   The clinic staff is available to answer your questions during regular business hours. Please don't hesitate to call and ask to speak to one of the nurses or medical assistants for clinical concerns. If you have a medical emergency, go to the nearest emergency room or call 911.  A surgeon from [facility] is always on call at the hospital.     For further information, please visit [email or web address]

## 2021-01-05 ENCOUNTER — Other Ambulatory Visit: Payer: Self-pay | Admitting: Oncology

## 2021-01-05 ENCOUNTER — Encounter (HOSPITAL_COMMUNITY): Payer: Self-pay | Admitting: General Surgery

## 2021-01-07 ENCOUNTER — Inpatient Hospital Stay (HOSPITAL_BASED_OUTPATIENT_CLINIC_OR_DEPARTMENT_OTHER): Payer: Medicare HMO | Admitting: Nurse Practitioner

## 2021-01-07 ENCOUNTER — Inpatient Hospital Stay: Payer: Medicare HMO

## 2021-01-07 ENCOUNTER — Encounter: Payer: Self-pay | Admitting: Nurse Practitioner

## 2021-01-07 ENCOUNTER — Other Ambulatory Visit: Payer: Self-pay

## 2021-01-07 VITALS — BP 129/87 | HR 96 | Temp 97.9°F | Resp 18 | Ht 66.0 in | Wt 136.7 lb

## 2021-01-07 DIAGNOSIS — C20 Malignant neoplasm of rectum: Secondary | ICD-10-CM

## 2021-01-07 DIAGNOSIS — Z79899 Other long term (current) drug therapy: Secondary | ICD-10-CM | POA: Diagnosis not present

## 2021-01-07 DIAGNOSIS — K6289 Other specified diseases of anus and rectum: Secondary | ICD-10-CM

## 2021-01-07 DIAGNOSIS — G893 Neoplasm related pain (acute) (chronic): Secondary | ICD-10-CM | POA: Diagnosis not present

## 2021-01-07 DIAGNOSIS — C19 Malignant neoplasm of rectosigmoid junction: Secondary | ICD-10-CM | POA: Diagnosis not present

## 2021-01-07 DIAGNOSIS — Z95828 Presence of other vascular implants and grafts: Secondary | ICD-10-CM

## 2021-01-07 DIAGNOSIS — Z20822 Contact with and (suspected) exposure to covid-19: Secondary | ICD-10-CM | POA: Diagnosis not present

## 2021-01-07 DIAGNOSIS — R634 Abnormal weight loss: Secondary | ICD-10-CM | POA: Diagnosis not present

## 2021-01-07 DIAGNOSIS — Z5111 Encounter for antineoplastic chemotherapy: Secondary | ICD-10-CM | POA: Diagnosis not present

## 2021-01-07 LAB — CMP (CANCER CENTER ONLY)
ALT: 8 U/L (ref 0–44)
AST: 16 U/L (ref 15–41)
Albumin: 3 g/dL — ABNORMAL LOW (ref 3.5–5.0)
Alkaline Phosphatase: 58 U/L (ref 38–126)
Anion gap: 14 (ref 5–15)
BUN: 5 mg/dL — ABNORMAL LOW (ref 8–23)
CO2: 25 mmol/L (ref 22–32)
Calcium: 9.1 mg/dL (ref 8.9–10.3)
Chloride: 104 mmol/L (ref 98–111)
Creatinine: 0.79 mg/dL (ref 0.61–1.24)
GFR, Estimated: >60 mL/min (ref >60)
Glucose, Bld: 74 mg/dL (ref 70–99)
Potassium: 3.9 mmol/L (ref 3.5–5.1)
Sodium: 143 mmol/L (ref 135–145)
Total Bilirubin: 0.3 mg/dL (ref 0.3–1.2)
Total Protein: 7.8 g/dL (ref 6.5–8.1)

## 2021-01-07 LAB — CBC WITH DIFFERENTIAL (CANCER CENTER ONLY)
Abs Immature Granulocytes: 0.01 10*3/uL (ref 0.00–0.07)
Basophils Absolute: 0 10*3/uL (ref 0.0–0.1)
Basophils Relative: 0 %
Eosinophils Absolute: 0.3 10*3/uL (ref 0.0–0.5)
Eosinophils Relative: 4 %
HCT: 31.4 % — ABNORMAL LOW (ref 39.0–52.0)
Hemoglobin: 10.1 g/dL — ABNORMAL LOW (ref 13.0–17.0)
Immature Granulocytes: 0 %
Lymphocytes Relative: 46 %
Lymphs Abs: 3.5 10*3/uL (ref 0.7–4.0)
MCH: 28.3 pg (ref 26.0–34.0)
MCHC: 32.2 g/dL (ref 30.0–36.0)
MCV: 88 fL (ref 80.0–100.0)
Monocytes Absolute: 0.4 10*3/uL (ref 0.1–1.0)
Monocytes Relative: 5 %
Neutro Abs: 3.5 10*3/uL (ref 1.7–7.7)
Neutrophils Relative %: 45 %
Platelet Count: 482 10*3/uL — ABNORMAL HIGH (ref 150–400)
RBC: 3.57 MIL/uL — ABNORMAL LOW (ref 4.22–5.81)
RDW: 14.3 % (ref 11.5–15.5)
WBC Count: 7.8 10*3/uL (ref 4.0–10.5)
nRBC: 0 % (ref 0.0–0.2)

## 2021-01-07 LAB — CEA (IN HOUSE-CHCC): CEA (CHCC-In House): 3.04 ng/mL (ref 0.00–5.00)

## 2021-01-07 MED ORDER — SODIUM CHLORIDE 0.9% FLUSH
10.0000 mL | Freq: Once | INTRAVENOUS | Status: AC
Start: 2021-01-07 — End: 2021-01-07
  Administered 2021-01-07: 10 mL
  Filled 2021-01-07: qty 10

## 2021-01-07 NOTE — Progress Notes (Signed)
  [facility] OFFICE PROGRESS NOTE   Diagnosis: Rectal cancer  INTERVAL HISTORY:   Phillip Brooks returns as scheduled.  He had an episode of lower abdominal pain, constipation, nausea/vomiting recently.  Symptoms have resolved.  Bowels now moving fairly regularly.  He continues to note intermittent bleeding with bowel movements.  He reports a good appetite.  Objective:  Vital signs in last 24 hours:  Blood pressure 129/87, pulse 96, temperature 97.9 F (36.6 C), temperature source Tympanic, resp. rate 18, height 5\' 6"  (1.676 m), weight 136 lb 11.2 oz (62 kg), SpO2 98 %.    Resp: Lungs clear bilaterally. Cardio: Regular rate and rhythm. GI: Abdomen soft and nontender.  No hepatomegaly. Vascular: No leg edema.  Skin: Palms without erythema. Port-A-Cath without erythema.   Lab Results:  Lab Results  Component Value Date   WBC 7.8 01/07/2021   HGB 10.1 (L) 01/07/2021   HCT 31.4 (L) 01/07/2021   MCV 88.0 01/07/2021   PLT 482 (H) 01/07/2021   NEUTROABS 3.5 01/07/2021    Imaging:  No results found.  Medications: I have reviewed the patient's current medications.  Assessment/Plan: 1. Rectal cancer ? Colonoscopy 12/06/2020-partially obstructing mass beginning at 7 cm from the anal verge-biopsy adenocarcinoma ? CTs 12/10/2020-large circumferential fungating mass at the rectosigmoid junction beginning at 9 cm from the anal verge, enlarged perirectal lymph nodes, prominent bilateral iliac and pelvic sidewall nodes-new compared to a CT from 2018, no evidence of metastatic disease to the chest, pulmonary nodules at the right apex-nonspecific ? MRI pelvis 12/24/2020-T4b, N2; distance from tumor to the internal anal sphincter 7.3 cm; findings of potential contained perforation with tract seen potentially extending through the tumor into the mesorectum. ? Plan for total neoadjuvant therapy ? Cycle 1 FOLFOX 01/07/2021  2. Pain secondary #1 3. Weight loss 4. Port-A-Cath  placement, Dr. Marcello Moores on 01/03/2021   Disposition: Phillip Brooks appears stable.  He is scheduled to begin FOLFOX 01/08/2021.  We again reviewed potential toxicities.  He agrees to proceed.  We reviewed the CBC from today.  Counts adequate to proceed with treatment.  He will begin a stool softener and laxative regimen.  Referral made to the [place] dietitian to discuss a low residue diet.  He will return for lab, follow-up, cycle 2 FOLFOX on 01/22/2021.  He will contact the office in the interim with any problems.    Ned Card ANP/GNP-BC   01/07/2021  9:23 AM

## 2021-01-08 ENCOUNTER — Inpatient Hospital Stay: Payer: Medicare HMO | Admitting: Dietician

## 2021-01-08 ENCOUNTER — Encounter: Payer: Self-pay | Admitting: *Deleted

## 2021-01-08 ENCOUNTER — Telehealth: Payer: Self-pay | Admitting: Oncology

## 2021-01-08 ENCOUNTER — Inpatient Hospital Stay: Payer: Medicare HMO

## 2021-01-08 VITALS — BP 124/87 | HR 85 | Temp 98.6°F | Resp 18

## 2021-01-08 DIAGNOSIS — C19 Malignant neoplasm of rectosigmoid junction: Secondary | ICD-10-CM | POA: Diagnosis not present

## 2021-01-08 DIAGNOSIS — C20 Malignant neoplasm of rectum: Secondary | ICD-10-CM | POA: Diagnosis not present

## 2021-01-08 DIAGNOSIS — R634 Abnormal weight loss: Secondary | ICD-10-CM | POA: Diagnosis not present

## 2021-01-08 DIAGNOSIS — Z5111 Encounter for antineoplastic chemotherapy: Secondary | ICD-10-CM | POA: Diagnosis not present

## 2021-01-08 DIAGNOSIS — Z79899 Other long term (current) drug therapy: Secondary | ICD-10-CM | POA: Diagnosis not present

## 2021-01-08 DIAGNOSIS — G893 Neoplasm related pain (acute) (chronic): Secondary | ICD-10-CM | POA: Diagnosis not present

## 2021-01-08 MED ORDER — PALONOSETRON HCL INJECTION 0.25 MG/5ML
INTRAVENOUS | Status: AC
  Filled 2021-01-08: qty 5

## 2021-01-08 MED ORDER — SODIUM CHLORIDE 0.9 % IV SOLN
10.0000 mg | Freq: Once | INTRAVENOUS | Status: AC
  Administered 2021-01-08: 10 mg via INTRAVENOUS
  Filled 2021-01-08: qty 10

## 2021-01-08 MED ORDER — DEXTROSE 5 % IV SOLN
85.0000 mg/m2 | Freq: Once | INTRAVENOUS | Status: AC
  Administered 2021-01-08: 145 mg via INTRAVENOUS
  Filled 2021-01-08: qty 20

## 2021-01-08 MED ORDER — LEUCOVORIN CALCIUM INJECTION 350 MG
400.0000 mg/m2 | Freq: Once | INTRAVENOUS | Status: AC
  Administered 2021-01-08: 684 mg via INTRAVENOUS
  Filled 2021-01-08: qty 34.2

## 2021-01-08 MED ORDER — SODIUM CHLORIDE 0.9 % IV SOLN
2400.0000 mg/m2 | INTRAVENOUS | Status: DC
  Administered 2021-01-08: 4100 mg via INTRAVENOUS
  Filled 2021-01-08: qty 82

## 2021-01-08 MED ORDER — DEXTROSE 5 % IV SOLN
Freq: Once | INTRAVENOUS | Status: AC
  Filled 2021-01-08: qty 250

## 2021-01-08 MED ORDER — PALONOSETRON HCL INJECTION 0.25 MG/5ML
0.2500 mg | Freq: Once | INTRAVENOUS | Status: AC
  Administered 2021-01-08: 0.25 mg via INTRAVENOUS

## 2021-01-08 MED ORDER — FLUOROURACIL CHEMO INJECTION 2.5 GM/50ML
400.0000 mg/m2 | Freq: Once | INTRAVENOUS | Status: AC
  Administered 2021-01-08: 700 mg via INTRAVENOUS
  Filled 2021-01-08: qty 14

## 2021-01-08 NOTE — Telephone Encounter (Signed)
Scheduled appt per 3/22 los - pt to get an updated schedule in treatment area.

## 2021-01-08 NOTE — Progress Notes (Signed)
Nutrition Assessment   ASSESSMENT: 65 year old male with rectal cancer. He is receiving neoadjuvant Folfox x 8 cycles followed by radiation. He is followed by Dr. Benay Spice  Past medical history of chronic diarrhea.  Met with patient during infusion. He reports having a good appetite, but afraid to eat much due to constipation/diarrhea. He had a chicken noodle soup, fruit cup, nabs, and cookies yesterday. Drinking water and juice. Patient relies on [employer] for groceries and most meals. He has met with [employer] LCSW, on wait list for Meals on Wheels. He has tried Boost in the past, says it gave him diarrhea. Patient recalls usual weight fluctuates 150-160 lbs, reports he was 200 lbs at one time when he was running around with the truck drivers.    Nutrition Focused Physical Exam: unable to complete   Medications: Compazine, Tramadol   Labs: 3/22 Hgb 10.1, BUN 5, Albumin 3.0   Anthropometrics: Patient is 14 pounds (9.3%) under his usual weight; significant  Height: 5'6" Weight: 136 lb 11.2 oz (62 kg) UBW: 150-160 lbs (per pt) BMI: 22.06    NUTRITION DIAGNOSIS: Unintentional weight loss related to colorectal cancer as evidenced by 9.3% decrease from usual weight.    INTERVENTION:  Educated on low residue diet Discussed small frequent meals/snacks high in calories and protein Suggested drinking nutrition supplement over course of a few hours Sample of Ensure Max and Ensure Enlive provided Fact sheets given Contact information provided   MONITORING, EVALUATION, GOAL: Patient will tolerate adequate calories and protein to minimize weight loss    Next Visit: Wednesday April 6 in infusion

## 2021-01-08 NOTE — Patient Instructions (Signed)
[facility] Discharge Instructions for Patients Receiving Chemotherapy  Today you received the following chemotherapy agents: oxaliplatin/leucovorin/fluororuacil.  To help prevent nausea and vomiting after your treatment, we encourage you to take your nausea medication as directed.   If you develop nausea and vomiting that is not controlled by your nausea medication, call the clinic.   BELOW ARE SYMPTOMS THAT SHOULD BE REPORTED IMMEDIATELY:  *FEVER GREATER THAN 100.5 F  *CHILLS WITH OR WITHOUT FEVER  NAUSEA AND VOMITING THAT IS NOT CONTROLLED WITH YOUR NAUSEA MEDICATION  *UNUSUAL SHORTNESS OF BREATH  *UNUSUAL BRUISING OR BLEEDING  TENDERNESS IN MOUTH AND THROAT WITH OR WITHOUT PRESENCE OF ULCERS  *URINARY PROBLEMS  *BOWEL PROBLEMS  UNUSUAL RASH Items with * indicate a potential emergency and should be followed up as soon as possible.  Feel free to call the clinic should you have any questions or concerns. The clinic phone number is [phone].  Please show the [facility] at check-in to the Emergency Department and triage nurse.  Oxaliplatin Injection What is this medicine? OXALIPLATIN (ox AL i PLA tin) is a chemotherapy drug. It targets fast dividing cells, like cancer cells, and causes these cells to die. This medicine is used to treat cancers of the colon and rectum, and many other cancers. This medicine may be used for other purposes; ask your health care provider or pharmacist if you have questions. COMMON BRAND NAME(S): Eloxatin What should I tell my health care provider before I take this medicine? They need to know if you have any of these conditions:  heart disease  history of irregular heartbeat  liver disease  low blood counts, like white cells, platelets, or red blood cells  lung or breathing disease, like asthma  take medicines that treat or prevent blood clots  tingling of the fingers or toes, or other nerve disorder  an  unusual or allergic reaction to oxaliplatin, other chemotherapy, other medicines, foods, dyes, or preservatives  pregnant or trying to get pregnant  breast-feeding How should I use this medicine? This drug is given as an infusion into a vein. It is administered in a hospital or clinic by a specially trained health care professional. Talk to your pediatrician regarding the use of this medicine in children. Special care may be needed. Overdosage: If you think you have taken too much of this medicine contact a poison control center or emergency room at once. NOTE: This medicine is only for you. Do not share this medicine with others. What if I miss a dose? It is important not to miss a dose. Call your doctor or health care professional if you are unable to keep an appointment. What may interact with this medicine? Do not take this medicine with any of the following medications:  cisapride  dronedarone  pimozide  thioridazine This medicine may also interact with the following medications:  aspirin and aspirin-like medicines  certain medicines that treat or prevent blood clots like warfarin, apixaban, dabigatran, and rivaroxaban  cisplatin  cyclosporine  diuretics  medicines for infection like acyclovir, adefovir, amphotericin B, bacitracin, cidofovir, foscarnet, ganciclovir, gentamicin, pentamidine, vancomycin  NSAIDs, medicines for pain and inflammation, like ibuprofen or naproxen  other medicines that prolong the QT interval (an abnormal heart rhythm)  pamidronate  zoledronic acid This list may not describe all possible interactions. Give your health care provider a list of all the medicines, herbs, non-prescription drugs, or dietary supplements you use. Also tell them if you smoke, drink alcohol, or use  illegal drugs. Some items may interact with your medicine. What should I watch for while using this medicine? Your condition will be monitored carefully while you are  receiving this medicine. You may need blood work done while you are taking this medicine. This medicine may make you feel generally unwell. This is not uncommon as chemotherapy can affect healthy cells as well as cancer cells. Report any side effects. Continue your course of treatment even though you feel ill unless your healthcare professional tells you to stop. This medicine can make you more sensitive to cold. Do not drink cold drinks or use ice. Cover exposed skin before coming in contact with cold temperatures or cold objects. When out in cold weather wear warm clothing and cover your mouth and nose to warm the air that goes into your lungs. Tell your doctor if you get sensitive to the cold. Do not become pregnant while taking this medicine or for 9 months after stopping it. Women should inform their health care professional if they wish to become pregnant or think they might be pregnant. Men should not father a child while taking this medicine and for 6 months after stopping it. There is potential for serious side effects to an unborn child. Talk to your health care professional for more information. Do not breast-feed a child while taking this medicine or for 3 months after stopping it. This medicine has caused ovarian failure in some women. This medicine may make it more difficult to get pregnant. Talk to your health care professional if you are concerned about your fertility. This medicine has caused decreased sperm counts in some men. This may make it more difficult to father a child. Talk to your health care professional if you are concerned about your fertility. This medicine may increase your risk of getting an infection. Call your health care professional for advice if you get a fever, chills, or sore throat, or other symptoms of a cold or flu. Do not treat yourself. Try to avoid being around people who are sick. Avoid taking medicines that contain aspirin, acetaminophen, ibuprofen, naproxen,  or ketoprofen unless instructed by your health care professional. These medicines may hide a fever. Be careful brushing or flossing your teeth or using a toothpick because you may get an infection or bleed more easily. If you have any dental work done, tell your dentist you are receiving this medicine. What side effects may I notice from receiving this medicine? Side effects that you should report to your doctor or health care professional as soon as possible:  allergic reactions like skin rash, itching or hives, swelling of the face, lips, or tongue  breathing problems  cough  low blood counts - this medicine may decrease the number of white blood cells, red blood cells, and platelets. You may be at increased risk for infections and bleeding  nausea, vomiting  pain, redness, or irritation at site where injected  pain, tingling, numbness in the hands or feet  signs and symptoms of bleeding such as bloody or black, tarry stools; red or dark brown urine; spitting up blood or brown material that looks like coffee grounds; red spots on the skin; unusual bruising or bleeding from the eyes, gums, or nose  signs and symptoms of a dangerous change in heartbeat or heart rhythm like chest pain; dizziness; fast, irregular heartbeat; palpitations; feeling faint or lightheaded; falls  signs and symptoms of infection like fever; chills; cough; sore throat; pain or trouble passing urine  signs and  symptoms of liver injury like dark yellow or brown urine; general ill feeling or flu-like symptoms; light-colored stools; loss of appetite; nausea; right upper belly pain; unusually weak or tired; yellowing of the eyes or skin  signs and symptoms of low red blood cells or anemia such as unusually weak or tired; feeling faint or lightheaded; falls  signs and symptoms of muscle injury like dark urine; trouble passing urine or change in the amount of urine; unusually weak or tired; muscle pain; back pain Side  effects that usually do not require medical attention (report to your doctor or health care professional if they continue or are bothersome):  changes in taste  diarrhea  gas  hair loss  loss of appetite  mouth sores This list may not describe all possible side effects. Call your doctor for medical advice about side effects. You may report side effects to FDA at 1-800-FDA-1088. Where should I keep my medicine? This drug is given in a hospital or clinic and will not be stored at home. NOTE: This sheet is a summary. It may not cover all possible information. If you have questions about this medicine, talk to your doctor, pharmacist, or health care provider.  2021 Elsevier/Gold Standard (2019-02-22 12:20:35)  Leucovorin injection What is this medicine? LEUCOVORIN (loo koe VOR in) is used to prevent or treat the harmful effects of some medicines. This medicine is used to treat anemia caused by a low amount of folic acid in the body. It is also used with 5-fluorouracil (5-FU) to treat colon cancer. This medicine may be used for other purposes; ask your health care provider or pharmacist if you have questions. What should I tell my health care provider before I take this medicine? They need to know if you have any of these conditions:  anemia from low levels of vitamin B-12 in the blood  an unusual or allergic reaction to leucovorin, folic acid, other medicines, foods, dyes, or preservatives  pregnant or trying to get pregnant  breast-feeding How should I use this medicine? This medicine is for injection into a muscle or into a vein. It is given by a health care professional in a hospital or clinic setting. Talk to your pediatrician regarding the use of this medicine in children. Special care may be needed. Overdosage: If you think you have taken too much of this medicine contact a poison control center or emergency room at once. NOTE: This medicine is only for you. Do not share this  medicine with others. What if I miss a dose? This does not apply. What may interact with this medicine?  capecitabine  fluorouracil  phenobarbital  phenytoin  primidone  trimethoprim-sulfamethoxazole This list may not describe all possible interactions. Give your health care provider a list of all the medicines, herbs, non-prescription drugs, or dietary supplements you use. Also tell them if you smoke, drink alcohol, or use illegal drugs. Some items may interact with your medicine. What should I watch for while using this medicine? Your condition will be monitored carefully while you are receiving this medicine. This medicine may increase the side effects of 5-fluorouracil, 5-FU. Tell your doctor or health care professional if you have diarrhea or mouth sores that do not get better or that get worse. What side effects may I notice from receiving this medicine? Side effects that you should report to your doctor or health care professional as soon as possible:  allergic reactions like skin rash, itching or hives, swelling of the face, lips, or  tongue  breathing problems  fever, infection  mouth sores  unusual bleeding or bruising  unusually weak or tired Side effects that usually do not require medical attention (report to your doctor or health care professional if they continue or are bothersome):  constipation or diarrhea  loss of appetite  nausea, vomiting This list may not describe all possible side effects. Call your doctor for medical advice about side effects. You may report side effects to FDA at 1-800-FDA-1088. Where should I keep my medicine? This drug is given in a hospital or clinic and will not be stored at home. NOTE: This sheet is a summary. It may not cover all possible information. If you have questions about this medicine, talk to your doctor, pharmacist, or health care provider.  2021 Elsevier/Gold Standard (2008-04-10 16:50:29)  Fluorouracil, 5-FU  injection What is this medicine? FLUOROURACIL, 5-FU (flure oh YOOR a sil) is a chemotherapy drug. It slows the growth of cancer cells. This medicine is used to treat many types of cancer like breast cancer, colon or rectal cancer, pancreatic cancer, and stomach cancer. This medicine may be used for other purposes; ask your health care provider or pharmacist if you have questions. COMMON BRAND NAME(S): Adrucil What should I tell my health care provider before I take this medicine? They need to know if you have any of these conditions:  blood disorders  dihydropyrimidine dehydrogenase (DPD) deficiency  infection (especially a virus infection such as chickenpox, cold sores, or herpes)  kidney disease  liver disease  malnourished, poor nutrition  recent or ongoing radiation therapy  an unusual or allergic reaction to fluorouracil, other chemotherapy, other medicines, foods, dyes, or preservatives  pregnant or trying to get pregnant  breast-feeding How should I use this medicine? This drug is given as an infusion or injection into a vein. It is administered in a hospital or clinic by a specially trained health care professional. Talk to your pediatrician regarding the use of this medicine in children. Special care may be needed. Overdosage: If you think you have taken too much of this medicine contact a poison control center or emergency room at once. NOTE: This medicine is only for you. Do not share this medicine with others. What if I miss a dose? It is important not to miss your dose. Call your doctor or health care professional if you are unable to keep an appointment. What may interact with this medicine? Do not take this medicine with any of the following medications:  live virus vaccines This medicine may also interact with the following medications:  medicines that treat or prevent blood clots like warfarin, enoxaparin, and dalteparin This list may not describe all  possible interactions. Give your health care provider a list of all the medicines, herbs, non-prescription drugs, or dietary supplements you use. Also tell them if you smoke, drink alcohol, or use illegal drugs. Some items may interact with your medicine. What should I watch for while using this medicine? Visit your doctor for checks on your progress. This drug may make you feel generally unwell. This is not uncommon, as chemotherapy can affect healthy cells as well as cancer cells. Report any side effects. Continue your course of treatment even though you feel ill unless your doctor tells you to stop. In some cases, you may be given additional medicines to help with side effects. Follow all directions for their use. Call your doctor or health care professional for advice if you get a fever, chills or sore throat,  or other symptoms of a cold or flu. Do not treat yourself. This drug decreases your body's ability to fight infections. Try to avoid being around people who are sick. This medicine may increase your risk to bruise or bleed. Call your doctor or health care professional if you notice any unusual bleeding. Be careful brushing and flossing your teeth or using a toothpick because you may get an infection or bleed more easily. If you have any dental work done, tell your dentist you are receiving this medicine. Avoid taking products that contain aspirin, acetaminophen, ibuprofen, naproxen, or ketoprofen unless instructed by your doctor. These medicines may hide a fever. Do not become pregnant while taking this medicine. Women should inform their doctor if they wish to become pregnant or think they might be pregnant. There is a potential for serious side effects to an unborn child. Talk to your health care professional or pharmacist for more information. Do not breast-feed an infant while taking this medicine. Men should inform their doctor if they wish to father a child. This medicine may lower sperm  counts. Do not treat diarrhea with over the counter products. Contact your doctor if you have diarrhea that lasts more than 2 days or if it is severe and watery. This medicine can make you more sensitive to the sun. Keep out of the sun. If you cannot avoid being in the sun, wear protective clothing and use sunscreen. Do not use sun lamps or tanning beds/booths. What side effects may I notice from receiving this medicine? Side effects that you should report to your doctor or health care professional as soon as possible:  allergic reactions like skin rash, itching or hives, swelling of the face, lips, or tongue  low blood counts - this medicine may decrease the number of white blood cells, red blood cells and platelets. You may be at increased risk for infections and bleeding.  signs of infection - fever or chills, cough, sore throat, pain or difficulty passing urine  signs of decreased platelets or bleeding - bruising, pinpoint red spots on the skin, black, tarry stools, blood in the urine  signs of decreased red blood cells - unusually weak or tired, fainting spells, lightheadedness  breathing problems  changes in vision  chest pain  mouth sores  nausea and vomiting  pain, swelling, redness at site where injected  pain, tingling, numbness in the hands or feet  redness, swelling, or sores on hands or feet  stomach pain  unusual bleeding Side effects that usually do not require medical attention (report to your doctor or health care professional if they continue or are bothersome):  changes in finger or toe nails  diarrhea  dry or itchy skin  hair loss  headache  loss of appetite  sensitivity of eyes to the light  stomach upset  unusually teary eyes This list may not describe all possible side effects. Call your doctor for medical advice about side effects. You may report side effects to FDA at 1-800-FDA-1088. Where should I keep my medicine? This drug is given  in a hospital or clinic and will not be stored at home. NOTE: This sheet is a summary. It may not cover all possible information. If you have questions about this medicine, talk to your doctor, pharmacist, or health care provider.  2021 Elsevier/Gold Standard (2019-09-05 15:00:03)

## 2021-01-08 NOTE — Progress Notes (Signed)
Phillip Brooks  Patient presented to [place] office at [facility] on 01/07/21 requesting update on [employer] referral and housing resources.  Patient stated he was still in his present living situation, but would like to explore other long term options.  CSW confirmed that a referral was sent to [employer] and patient was currently on the waiting list.  CSW and patient discussed housing options.  Patient stated he was currently in "dept" to the housing authority/section 8.  Patient states he has a remaining balance from his last housing arrangements through the HUD program.  Patient states he plans to call and discuss payment options.  CSW and patient discussed other housing options based on income.  CSW met with patient in the infusion room on 01/08/21 to review the following housing resources lists: [place] [facility] and [facility] [place].  CSW also provided patient with [email or web address] to search affordable housing in [place].  Patient stated his daughter would assist him with the online search.  Patient also plans to contact his caseworker with DSS for additional resources.  CSW provided contact information, and patient plans to contact CSW with questions and/or updates.   Johnnye Lana, MSW, LCSW, OSW-C Clinical Social Worker [facility] [phone]

## 2021-01-09 ENCOUNTER — Telehealth: Payer: Self-pay | Admitting: *Deleted

## 2021-01-10 ENCOUNTER — Other Ambulatory Visit: Payer: Self-pay

## 2021-01-10 ENCOUNTER — Inpatient Hospital Stay: Payer: Medicare HMO

## 2021-01-10 VITALS — BP 120/82 | HR 87 | Temp 97.8°F | Resp 18

## 2021-01-10 DIAGNOSIS — R634 Abnormal weight loss: Secondary | ICD-10-CM | POA: Diagnosis not present

## 2021-01-10 DIAGNOSIS — Z79899 Other long term (current) drug therapy: Secondary | ICD-10-CM | POA: Diagnosis not present

## 2021-01-10 DIAGNOSIS — C20 Malignant neoplasm of rectum: Secondary | ICD-10-CM

## 2021-01-10 DIAGNOSIS — C19 Malignant neoplasm of rectosigmoid junction: Secondary | ICD-10-CM | POA: Diagnosis not present

## 2021-01-10 DIAGNOSIS — Z5111 Encounter for antineoplastic chemotherapy: Secondary | ICD-10-CM | POA: Diagnosis not present

## 2021-01-10 DIAGNOSIS — G893 Neoplasm related pain (acute) (chronic): Secondary | ICD-10-CM | POA: Diagnosis not present

## 2021-01-10 MED ORDER — SODIUM CHLORIDE 0.9% FLUSH
10.0000 mL | INTRAVENOUS | Status: DC | PRN
  Administered 2021-01-10: 10 mL
  Filled 2021-01-10: qty 10

## 2021-01-10 MED ORDER — HEPARIN SOD (PORK) LOCK FLUSH 100 UNIT/ML IV SOLN
500.0000 [IU] | Freq: Once | INTRAVENOUS | Status: AC | PRN
  Administered 2021-01-10: 500 [IU]
  Filled 2021-01-10: qty 5

## 2021-01-17 ENCOUNTER — Telehealth: Payer: Self-pay

## 2021-01-17 ENCOUNTER — Other Ambulatory Visit: Payer: Self-pay | Admitting: Nurse Practitioner

## 2021-01-17 DIAGNOSIS — C20 Malignant neoplasm of rectum: Secondary | ICD-10-CM

## 2021-01-17 MED ORDER — TRAMADOL HCL 50 MG PO TABS: 50.0000 mg | ORAL_TABLET | Freq: Two times a day (BID) | ORAL | 0 refills | Status: DC | PRN

## 2021-01-17 NOTE — Telephone Encounter (Signed)
Patient calls requesting refill on Tramadol be sent into [facility] in chart.

## 2021-01-18 ENCOUNTER — Other Ambulatory Visit: Payer: Self-pay | Admitting: Oncology

## 2021-01-21 ENCOUNTER — Ambulatory Visit: Payer: Self-pay

## 2021-01-21 ENCOUNTER — Other Ambulatory Visit: Payer: Self-pay

## 2021-01-21 ENCOUNTER — Other Ambulatory Visit: Payer: Medicare HMO

## 2021-01-21 ENCOUNTER — Ambulatory Visit: Payer: Self-pay | Admitting: Nurse Practitioner

## 2021-01-22 ENCOUNTER — Other Ambulatory Visit: Payer: Self-pay

## 2021-01-22 ENCOUNTER — Inpatient Hospital Stay (HOSPITAL_BASED_OUTPATIENT_CLINIC_OR_DEPARTMENT_OTHER): Payer: Medicare HMO | Admitting: Nurse Practitioner

## 2021-01-22 ENCOUNTER — Inpatient Hospital Stay: Payer: Medicare HMO

## 2021-01-22 ENCOUNTER — Encounter: Payer: Self-pay | Admitting: Nurse Practitioner

## 2021-01-22 ENCOUNTER — Telehealth: Payer: Self-pay | Admitting: Oncology

## 2021-01-22 ENCOUNTER — Inpatient Hospital Stay: Payer: Medicare HMO | Attending: Oncology

## 2021-01-22 VITALS — BP 126/82 | HR 83 | Temp 98.1°F | Resp 18 | Ht 66.0 in | Wt 134.0 lb

## 2021-01-22 DIAGNOSIS — G893 Neoplasm related pain (acute) (chronic): Secondary | ICD-10-CM | POA: Diagnosis not present

## 2021-01-22 DIAGNOSIS — Z5111 Encounter for antineoplastic chemotherapy: Secondary | ICD-10-CM | POA: Diagnosis not present

## 2021-01-22 DIAGNOSIS — C20 Malignant neoplasm of rectum: Secondary | ICD-10-CM | POA: Insufficient documentation

## 2021-01-22 DIAGNOSIS — R634 Abnormal weight loss: Secondary | ICD-10-CM | POA: Diagnosis not present

## 2021-01-22 DIAGNOSIS — Z95828 Presence of other vascular implants and grafts: Secondary | ICD-10-CM

## 2021-01-22 LAB — CMP (CANCER CENTER ONLY)
ALT: <6 U/L (ref 0–44)
AST: 10 U/L — ABNORMAL LOW (ref 15–41)
Albumin: 3.7 g/dL (ref 3.5–5.0)
Alkaline Phosphatase: 53 U/L (ref 38–126)
Anion gap: 9 (ref 5–15)
BUN: 6 mg/dL — ABNORMAL LOW (ref 8–23)
CO2: 25 mmol/L (ref 22–32)
Calcium: 8.8 mg/dL — ABNORMAL LOW (ref 8.9–10.3)
Chloride: 103 mmol/L (ref 98–111)
Creatinine: 0.78 mg/dL (ref 0.61–1.24)
GFR, Estimated: >60 mL/min (ref >60)
Glucose, Bld: 94 mg/dL (ref 70–99)
Potassium: 4 mmol/L (ref 3.5–5.1)
Sodium: 137 mmol/L (ref 135–145)
Total Bilirubin: 0.3 mg/dL (ref 0.3–1.2)
Total Protein: 7.5 g/dL (ref 6.5–8.1)

## 2021-01-22 LAB — CBC WITH DIFFERENTIAL (CANCER CENTER ONLY)
Abs Immature Granulocytes: 0 10*3/uL (ref 0.00–0.07)
Basophils Absolute: 0 10*3/uL (ref 0.0–0.1)
Basophils Relative: 1 %
Eosinophils Absolute: 0.6 10*3/uL — ABNORMAL HIGH (ref 0.0–0.5)
Eosinophils Relative: 10 %
HCT: 33 % — ABNORMAL LOW (ref 39.0–52.0)
Hemoglobin: 10.3 g/dL — ABNORMAL LOW (ref 13.0–17.0)
Immature Granulocytes: 0 %
Lymphocytes Relative: 43 %
Lymphs Abs: 2.5 10*3/uL (ref 0.7–4.0)
MCH: 28.5 pg (ref 26.0–34.0)
MCHC: 31.2 g/dL (ref 30.0–36.0)
MCV: 91.4 fL (ref 80.0–100.0)
Monocytes Absolute: 0.6 10*3/uL (ref 0.1–1.0)
Monocytes Relative: 10 %
Neutro Abs: 2 10*3/uL (ref 1.7–7.7)
Neutrophils Relative %: 36 %
Platelet Count: 390 10*3/uL (ref 150–400)
RBC: 3.61 MIL/uL — ABNORMAL LOW (ref 4.22–5.81)
RDW: 14.8 % (ref 11.5–15.5)
WBC Count: 5.7 10*3/uL (ref 4.0–10.5)
nRBC: 0 % (ref 0.0–0.2)

## 2021-01-22 MED ORDER — SODIUM CHLORIDE 0.9 % IV SOLN
10.0000 mg | Freq: Once | INTRAVENOUS | Status: AC
  Administered 2021-01-22: 10 mg via INTRAVENOUS
  Filled 2021-01-22: qty 1

## 2021-01-22 MED ORDER — DEXTROSE 5 % IV SOLN
Freq: Once | INTRAVENOUS | Status: AC
  Filled 2021-01-22: qty 250

## 2021-01-22 MED ORDER — PALONOSETRON HCL INJECTION 0.25 MG/5ML
0.2500 mg | Freq: Once | INTRAVENOUS | Status: AC
  Administered 2021-01-22: 0.25 mg via INTRAVENOUS

## 2021-01-22 MED ORDER — SODIUM CHLORIDE 0.9% FLUSH
10.0000 mL | Freq: Once | INTRAVENOUS | Status: AC
  Administered 2021-01-22: 10 mL
  Filled 2021-01-22: qty 10

## 2021-01-22 MED ORDER — SODIUM CHLORIDE 0.9% FLUSH
10.0000 mL | INTRAVENOUS | Status: DC | PRN
  Filled 2021-01-22: qty 10

## 2021-01-22 MED ORDER — SODIUM CHLORIDE 0.9 % IV SOLN
2400.0000 mg/m2 | INTRAVENOUS | Status: DC
  Administered 2021-01-22: 4100 mg via INTRAVENOUS
  Filled 2021-01-22: qty 82

## 2021-01-22 MED ORDER — LEUCOVORIN CALCIUM INJECTION 350 MG
400.0000 mg/m2 | Freq: Once | INTRAVENOUS | Status: AC
  Administered 2021-01-22: 684 mg via INTRAVENOUS
  Filled 2021-01-22: qty 34.2

## 2021-01-22 MED ORDER — OXALIPLATIN CHEMO INJECTION 100 MG/20ML
85.0000 mg/m2 | Freq: Once | INTRAVENOUS | Status: AC
  Administered 2021-01-22: 145 mg via INTRAVENOUS
  Filled 2021-01-22: qty 20

## 2021-01-22 MED ORDER — FLUOROURACIL CHEMO INJECTION 2.5 GM/50ML
400.0000 mg/m2 | Freq: Once | INTRAVENOUS | Status: AC
  Administered 2021-01-22: 700 mg via INTRAVENOUS
  Filled 2021-01-22: qty 14

## 2021-01-22 MED ORDER — PALONOSETRON HCL INJECTION 0.25 MG/5ML
INTRAVENOUS | Status: AC
  Filled 2021-01-22: qty 5

## 2021-01-22 NOTE — Patient Instructions (Signed)

## 2021-01-22 NOTE — Telephone Encounter (Signed)
Scheduled appt per 4/6 los - gave pt AVS and calender.

## 2021-01-22 NOTE — Progress Notes (Signed)
  [facility] OFFICE PROGRESS NOTE   Diagnosis: Rectal cancer  INTERVAL HISTORY:   Phillip Brooks returns as scheduled.  He completed cycle 1 FOLFOX 01/07/2021.  He denies nausea/vomiting.  No mouth sores.  He has occasional loose stools, no frank diarrhea.  No persistent neuropathy symptoms.  He continues to have rectal pain and bleeding.  He is taking tramadol every 8 hours as needed.  Objective:  Vital signs in last 24 hours:  Blood pressure 126/82, pulse 83, temperature 98.1 F (36.7 C), temperature source Tympanic, resp. rate 18, height 5\' 6"  (1.676 m), weight 134 lb (60.8 kg), SpO2 99 %.    HEENT: No thrush or ulcers. Resp: Lungs clear bilaterally. Cardio: Regular rate and rhythm. GI: Abdomen soft and nontender.  No hepatomegaly. Vascular: No leg edema.  Skin: Palms without erythema. Port-A-Cath without erythema.   Lab Results:  Lab Results  Component Value Date   WBC 5.7 01/22/2021   HGB 10.3 (L) 01/22/2021   HCT 33.0 (L) 01/22/2021   MCV 91.4 01/22/2021   PLT 390 01/22/2021   NEUTROABS 2.0 01/22/2021    Imaging:  No results found.  Medications: I have reviewed the patient's current medications.  Assessment/Plan: 1. Rectal cancer ? Colonoscopy 12/06/2020-partially obstructing mass beginning at 7 cm from the anal verge-biopsy adenocarcinoma ? CTs 12/10/2020-large circumferential fungating mass at the rectosigmoid junction beginning at 9 cm from the anal verge, enlarged perirectal lymph nodes, prominent bilateral iliac and pelvic sidewall nodes-new compared to a CT from 2018, no evidence of metastatic disease to the chest, pulmonary nodules at the right apex-nonspecific ? MRI pelvis 12/24/2020-T4b, N2; distance from tumor to the internal anal sphincter 7.3 cm; findings of potential contained perforation with tract seen potentially extending through the tumor into the mesorectum. ? Plan for total neoadjuvant therapy ? Cycle 1 FOLFOX 01/07/2021 ? Cycle 2  FOLFOX 01/22/2021  2. Pain secondary #1 3. Weight loss 4. Port-A-Cath placement, Dr. Marcello Moores on 01/03/2021   Disposition: Phillip Brooks appears stable.  He has completed 1 cycle of FOLFOX.  He tolerated well.  Plan to proceed with cycle 2 today as scheduled.  We reviewed the CBC from today.  Counts adequate to proceed with treatment.  He will return for lab, follow-up, cycle 3 FOLFOX in 2 weeks.    Ned Card ANP/GNP-BC   01/22/2021  9:06 AM

## 2021-01-22 NOTE — Patient Instructions (Signed)
[facility] Discharge Instructions for Patients Receiving Chemotherapy  Today you received the following chemotherapy agents: oxaliplatin/leucovorin/fluororuacil.  To help prevent nausea and vomiting after your treatment, we encourage you to take your nausea medication as directed.   If you develop nausea and vomiting that is not controlled by your nausea medication, call the clinic.   BELOW ARE SYMPTOMS THAT SHOULD BE REPORTED IMMEDIATELY:  *FEVER GREATER THAN 100.5 F  *CHILLS WITH OR WITHOUT FEVER  NAUSEA AND VOMITING THAT IS NOT CONTROLLED WITH YOUR NAUSEA MEDICATION  *UNUSUAL SHORTNESS OF BREATH  *UNUSUAL BRUISING OR BLEEDING  TENDERNESS IN MOUTH AND THROAT WITH OR WITHOUT PRESENCE OF ULCERS  *URINARY PROBLEMS  *BOWEL PROBLEMS  UNUSUAL RASH Items with * indicate a potential emergency and should be followed up as soon as possible.  Feel free to call the clinic should you have any questions or concerns. The clinic phone number is [phone].  Please show the [facility] at check-in to the Emergency Department and triage nurse.  Oxaliplatin Injection What is this medicine? OXALIPLATIN (ox AL i PLA tin) is a chemotherapy drug. It targets fast dividing cells, like cancer cells, and causes these cells to die. This medicine is used to treat cancers of the colon and rectum, and many other cancers. This medicine may be used for other purposes; ask your health care provider or pharmacist if you have questions. COMMON BRAND NAME(S): Eloxatin What should I tell my health care provider before I take this medicine? They need to know if you have any of these conditions:  heart disease  history of irregular heartbeat  liver disease  low blood counts, like white cells, platelets, or red blood cells  lung or breathing disease, like asthma  take medicines that treat or prevent blood clots  tingling of the fingers or toes, or other nerve disorder  an  unusual or allergic reaction to oxaliplatin, other chemotherapy, other medicines, foods, dyes, or preservatives  pregnant or trying to get pregnant  breast-feeding How should I use this medicine? This drug is given as an infusion into a vein. It is administered in a hospital or clinic by a specially trained health care professional. Talk to your pediatrician regarding the use of this medicine in children. Special care may be needed. Overdosage: If you think you have taken too much of this medicine contact a poison control center or emergency room at once. NOTE: This medicine is only for you. Do not share this medicine with others. What if I miss a dose? It is important not to miss a dose. Call your doctor or health care professional if you are unable to keep an appointment. What may interact with this medicine? Do not take this medicine with any of the following medications:  cisapride  dronedarone  pimozide  thioridazine This medicine may also interact with the following medications:  aspirin and aspirin-like medicines  certain medicines that treat or prevent blood clots like warfarin, apixaban, dabigatran, and rivaroxaban  cisplatin  cyclosporine  diuretics  medicines for infection like acyclovir, adefovir, amphotericin B, bacitracin, cidofovir, foscarnet, ganciclovir, gentamicin, pentamidine, vancomycin  NSAIDs, medicines for pain and inflammation, like ibuprofen or naproxen  other medicines that prolong the QT interval (an abnormal heart rhythm)  pamidronate  zoledronic acid This list may not describe all possible interactions. Give your health care provider a list of all the medicines, herbs, non-prescription drugs, or dietary supplements you use. Also tell them if you smoke, drink alcohol, or use  illegal drugs. Some items may interact with your medicine. What should I watch for while using this medicine? Your condition will be monitored carefully while you are  receiving this medicine. You may need blood work done while you are taking this medicine. This medicine may make you feel generally unwell. This is not uncommon as chemotherapy can affect healthy cells as well as cancer cells. Report any side effects. Continue your course of treatment even though you feel ill unless your healthcare professional tells you to stop. This medicine can make you more sensitive to cold. Do not drink cold drinks or use ice. Cover exposed skin before coming in contact with cold temperatures or cold objects. When out in cold weather wear warm clothing and cover your mouth and nose to warm the air that goes into your lungs. Tell your doctor if you get sensitive to the cold. Do not become pregnant while taking this medicine or for 9 months after stopping it. Women should inform their health care professional if they wish to become pregnant or think they might be pregnant. Men should not father a child while taking this medicine and for 6 months after stopping it. There is potential for serious side effects to an unborn child. Talk to your health care professional for more information. Do not breast-feed a child while taking this medicine or for 3 months after stopping it. This medicine has caused ovarian failure in some women. This medicine may make it more difficult to get pregnant. Talk to your health care professional if you are concerned about your fertility. This medicine has caused decreased sperm counts in some men. This may make it more difficult to father a child. Talk to your health care professional if you are concerned about your fertility. This medicine may increase your risk of getting an infection. Call your health care professional for advice if you get a fever, chills, or sore throat, or other symptoms of a cold or flu. Do not treat yourself. Try to avoid being around people who are sick. Avoid taking medicines that contain aspirin, acetaminophen, ibuprofen, naproxen,  or ketoprofen unless instructed by your health care professional. These medicines may hide a fever. Be careful brushing or flossing your teeth or using a toothpick because you may get an infection or bleed more easily. If you have any dental work done, tell your dentist you are receiving this medicine. What side effects may I notice from receiving this medicine? Side effects that you should report to your doctor or health care professional as soon as possible:  allergic reactions like skin rash, itching or hives, swelling of the face, lips, or tongue  breathing problems  cough  low blood counts - this medicine may decrease the number of white blood cells, red blood cells, and platelets. You may be at increased risk for infections and bleeding  nausea, vomiting  pain, redness, or irritation at site where injected  pain, tingling, numbness in the hands or feet  signs and symptoms of bleeding such as bloody or black, tarry stools; red or dark brown urine; spitting up blood or brown material that looks like coffee grounds; red spots on the skin; unusual bruising or bleeding from the eyes, gums, or nose  signs and symptoms of a dangerous change in heartbeat or heart rhythm like chest pain; dizziness; fast, irregular heartbeat; palpitations; feeling faint or lightheaded; falls  signs and symptoms of infection like fever; chills; cough; sore throat; pain or trouble passing urine  signs and  symptoms of liver injury like dark yellow or brown urine; general ill feeling or flu-like symptoms; light-colored stools; loss of appetite; nausea; right upper belly pain; unusually weak or tired; yellowing of the eyes or skin  signs and symptoms of low red blood cells or anemia such as unusually weak or tired; feeling faint or lightheaded; falls  signs and symptoms of muscle injury like dark urine; trouble passing urine or change in the amount of urine; unusually weak or tired; muscle pain; back pain Side  effects that usually do not require medical attention (report to your doctor or health care professional if they continue or are bothersome):  changes in taste  diarrhea  gas  hair loss  loss of appetite  mouth sores This list may not describe all possible side effects. Call your doctor for medical advice about side effects. You may report side effects to FDA at 1-800-FDA-1088. Where should I keep my medicine? This drug is given in a hospital or clinic and will not be stored at home. NOTE: This sheet is a summary. It may not cover all possible information. If you have questions about this medicine, talk to your doctor, pharmacist, or health care provider.  2021 Elsevier/Gold Standard (2019-02-22 12:20:35)  Leucovorin injection What is this medicine? LEUCOVORIN (loo koe VOR in) is used to prevent or treat the harmful effects of some medicines. This medicine is used to treat anemia caused by a low amount of folic acid in the body. It is also used with 5-fluorouracil (5-FU) to treat colon cancer. This medicine may be used for other purposes; ask your health care provider or pharmacist if you have questions. What should I tell my health care provider before I take this medicine? They need to know if you have any of these conditions:  anemia from low levels of vitamin B-12 in the blood  an unusual or allergic reaction to leucovorin, folic acid, other medicines, foods, dyes, or preservatives  pregnant or trying to get pregnant  breast-feeding How should I use this medicine? This medicine is for injection into a muscle or into a vein. It is given by a health care professional in a hospital or clinic setting. Talk to your pediatrician regarding the use of this medicine in children. Special care may be needed. Overdosage: If you think you have taken too much of this medicine contact a poison control center or emergency room at once. NOTE: This medicine is only for you. Do not share this  medicine with others. What if I miss a dose? This does not apply. What may interact with this medicine?  capecitabine  fluorouracil  phenobarbital  phenytoin  primidone  trimethoprim-sulfamethoxazole This list may not describe all possible interactions. Give your health care provider a list of all the medicines, herbs, non-prescription drugs, or dietary supplements you use. Also tell them if you smoke, drink alcohol, or use illegal drugs. Some items may interact with your medicine. What should I watch for while using this medicine? Your condition will be monitored carefully while you are receiving this medicine. This medicine may increase the side effects of 5-fluorouracil, 5-FU. Tell your doctor or health care professional if you have diarrhea or mouth sores that do not get better or that get worse. What side effects may I notice from receiving this medicine? Side effects that you should report to your doctor or health care professional as soon as possible:  allergic reactions like skin rash, itching or hives, swelling of the face, lips, or  tongue  breathing problems  fever, infection  mouth sores  unusual bleeding or bruising  unusually weak or tired Side effects that usually do not require medical attention (report to your doctor or health care professional if they continue or are bothersome):  constipation or diarrhea  loss of appetite  nausea, vomiting This list may not describe all possible side effects. Call your doctor for medical advice about side effects. You may report side effects to FDA at 1-800-FDA-1088. Where should I keep my medicine? This drug is given in a hospital or clinic and will not be stored at home. NOTE: This sheet is a summary. It may not cover all possible information. If you have questions about this medicine, talk to your doctor, pharmacist, or health care provider.  2021 Elsevier/Gold Standard (2008-04-10 16:50:29)  Fluorouracil, 5-FU  injection What is this medicine? FLUOROURACIL, 5-FU (flure oh YOOR a sil) is a chemotherapy drug. It slows the growth of cancer cells. This medicine is used to treat many types of cancer like breast cancer, colon or rectal cancer, pancreatic cancer, and stomach cancer. This medicine may be used for other purposes; ask your health care provider or pharmacist if you have questions. COMMON BRAND NAME(S): Adrucil What should I tell my health care provider before I take this medicine? They need to know if you have any of these conditions:  blood disorders  dihydropyrimidine dehydrogenase (DPD) deficiency  infection (especially a virus infection such as chickenpox, cold sores, or herpes)  kidney disease  liver disease  malnourished, poor nutrition  recent or ongoing radiation therapy  an unusual or allergic reaction to fluorouracil, other chemotherapy, other medicines, foods, dyes, or preservatives  pregnant or trying to get pregnant  breast-feeding How should I use this medicine? This drug is given as an infusion or injection into a vein. It is administered in a hospital or clinic by a specially trained health care professional. Talk to your pediatrician regarding the use of this medicine in children. Special care may be needed. Overdosage: If you think you have taken too much of this medicine contact a poison control center or emergency room at once. NOTE: This medicine is only for you. Do not share this medicine with others. What if I miss a dose? It is important not to miss your dose. Call your doctor or health care professional if you are unable to keep an appointment. What may interact with this medicine? Do not take this medicine with any of the following medications:  live virus vaccines This medicine may also interact with the following medications:  medicines that treat or prevent blood clots like warfarin, enoxaparin, and dalteparin This list may not describe all  possible interactions. Give your health care provider a list of all the medicines, herbs, non-prescription drugs, or dietary supplements you use. Also tell them if you smoke, drink alcohol, or use illegal drugs. Some items may interact with your medicine. What should I watch for while using this medicine? Visit your doctor for checks on your progress. This drug may make you feel generally unwell. This is not uncommon, as chemotherapy can affect healthy cells as well as cancer cells. Report any side effects. Continue your course of treatment even though you feel ill unless your doctor tells you to stop. In some cases, you may be given additional medicines to help with side effects. Follow all directions for their use. Call your doctor or health care professional for advice if you get a fever, chills or sore throat,  or other symptoms of a cold or flu. Do not treat yourself. This drug decreases your body's ability to fight infections. Try to avoid being around people who are sick. This medicine may increase your risk to bruise or bleed. Call your doctor or health care professional if you notice any unusual bleeding. Be careful brushing and flossing your teeth or using a toothpick because you may get an infection or bleed more easily. If you have any dental work done, tell your dentist you are receiving this medicine. Avoid taking products that contain aspirin, acetaminophen, ibuprofen, naproxen, or ketoprofen unless instructed by your doctor. These medicines may hide a fever. Do not become pregnant while taking this medicine. Women should inform their doctor if they wish to become pregnant or think they might be pregnant. There is a potential for serious side effects to an unborn child. Talk to your health care professional or pharmacist for more information. Do not breast-feed an infant while taking this medicine. Men should inform their doctor if they wish to father a child. This medicine may lower sperm  counts. Do not treat diarrhea with over the counter products. Contact your doctor if you have diarrhea that lasts more than 2 days or if it is severe and watery. This medicine can make you more sensitive to the sun. Keep out of the sun. If you cannot avoid being in the sun, wear protective clothing and use sunscreen. Do not use sun lamps or tanning beds/booths. What side effects may I notice from receiving this medicine? Side effects that you should report to your doctor or health care professional as soon as possible:  allergic reactions like skin rash, itching or hives, swelling of the face, lips, or tongue  low blood counts - this medicine may decrease the number of white blood cells, red blood cells and platelets. You may be at increased risk for infections and bleeding.  signs of infection - fever or chills, cough, sore throat, pain or difficulty passing urine  signs of decreased platelets or bleeding - bruising, pinpoint red spots on the skin, black, tarry stools, blood in the urine  signs of decreased red blood cells - unusually weak or tired, fainting spells, lightheadedness  breathing problems  changes in vision  chest pain  mouth sores  nausea and vomiting  pain, swelling, redness at site where injected  pain, tingling, numbness in the hands or feet  redness, swelling, or sores on hands or feet  stomach pain  unusual bleeding Side effects that usually do not require medical attention (report to your doctor or health care professional if they continue or are bothersome):  changes in finger or toe nails  diarrhea  dry or itchy skin  hair loss  headache  loss of appetite  sensitivity of eyes to the light  stomach upset  unusually teary eyes This list may not describe all possible side effects. Call your doctor for medical advice about side effects. You may report side effects to FDA at 1-800-FDA-1088. Where should I keep my medicine? This drug is given  in a hospital or clinic and will not be stored at home. NOTE: This sheet is a summary. It may not cover all possible information. If you have questions about this medicine, talk to your doctor, pharmacist, or health care provider.  2021 Elsevier/Gold Standard (2019-09-05 15:00:03)

## 2021-01-24 ENCOUNTER — Inpatient Hospital Stay: Payer: Medicare HMO

## 2021-01-24 ENCOUNTER — Other Ambulatory Visit: Payer: Self-pay

## 2021-01-24 DIAGNOSIS — Z5111 Encounter for antineoplastic chemotherapy: Secondary | ICD-10-CM | POA: Diagnosis not present

## 2021-01-24 DIAGNOSIS — Z95828 Presence of other vascular implants and grafts: Secondary | ICD-10-CM

## 2021-01-24 DIAGNOSIS — C20 Malignant neoplasm of rectum: Secondary | ICD-10-CM | POA: Diagnosis not present

## 2021-01-24 DIAGNOSIS — G893 Neoplasm related pain (acute) (chronic): Secondary | ICD-10-CM | POA: Diagnosis not present

## 2021-01-24 DIAGNOSIS — R634 Abnormal weight loss: Secondary | ICD-10-CM | POA: Diagnosis not present

## 2021-01-24 MED ORDER — HEPARIN SOD (PORK) LOCK FLUSH 100 UNIT/ML IV SOLN
500.0000 [IU] | Freq: Once | INTRAVENOUS | Status: AC
  Administered 2021-01-24: 500 [IU]
  Filled 2021-01-24: qty 5

## 2021-01-24 MED ORDER — SODIUM CHLORIDE 0.9% FLUSH
10.0000 mL | Freq: Once | INTRAVENOUS | Status: AC
  Administered 2021-01-24: 10 mL
  Filled 2021-01-24: qty 10

## 2021-01-28 ENCOUNTER — Encounter: Payer: Self-pay | Admitting: Dietician

## 2021-01-28 NOTE — Progress Notes (Signed)
Nutrition   Attempted to contact patient via telephone for nutrition follow-up. Patient did not answer. Voicemail left with request for return call. RD contact information provided.    Phillip Brooks, [place], [facility] Cell [phone]

## 2021-01-30 ENCOUNTER — Other Ambulatory Visit: Payer: Self-pay | Admitting: Oncology

## 2021-01-31 ENCOUNTER — Other Ambulatory Visit: Payer: Self-pay | Admitting: Nurse Practitioner

## 2021-01-31 DIAGNOSIS — C20 Malignant neoplasm of rectum: Secondary | ICD-10-CM

## 2021-01-31 MED ORDER — TRAMADOL HCL 50 MG PO TABS: 50.0000 mg | ORAL_TABLET | Freq: Two times a day (BID) | ORAL | 0 refills | Status: DC | PRN

## 2021-01-31 NOTE — Telephone Encounter (Signed)
refill 

## 2021-02-05 ENCOUNTER — Inpatient Hospital Stay: Payer: Medicare HMO | Admitting: Oncology

## 2021-02-05 ENCOUNTER — Inpatient Hospital Stay: Payer: Medicare HMO

## 2021-02-05 ENCOUNTER — Inpatient Hospital Stay (HOSPITAL_BASED_OUTPATIENT_CLINIC_OR_DEPARTMENT_OTHER): Payer: Medicare HMO | Admitting: Oncology

## 2021-02-05 ENCOUNTER — Other Ambulatory Visit: Payer: Self-pay

## 2021-02-05 ENCOUNTER — Ambulatory Visit: Payer: Self-pay

## 2021-02-05 VITALS — BP 139/88 | HR 81 | Temp 97.8°F | Resp 17 | Ht 66.0 in | Wt 134.8 lb

## 2021-02-05 DIAGNOSIS — C20 Malignant neoplasm of rectum: Secondary | ICD-10-CM | POA: Diagnosis not present

## 2021-02-05 DIAGNOSIS — G893 Neoplasm related pain (acute) (chronic): Secondary | ICD-10-CM | POA: Diagnosis not present

## 2021-02-05 DIAGNOSIS — Z5111 Encounter for antineoplastic chemotherapy: Secondary | ICD-10-CM | POA: Diagnosis not present

## 2021-02-05 DIAGNOSIS — Z95828 Presence of other vascular implants and grafts: Secondary | ICD-10-CM

## 2021-02-05 DIAGNOSIS — R634 Abnormal weight loss: Secondary | ICD-10-CM | POA: Diagnosis not present

## 2021-02-05 LAB — CMP (CANCER CENTER ONLY)
ALT: 5 U/L (ref 0–44)
AST: 11 U/L — ABNORMAL LOW (ref 15–41)
Albumin: 3.8 g/dL (ref 3.5–5.0)
Alkaline Phosphatase: 46 U/L (ref 38–126)
Anion gap: 8 (ref 5–15)
BUN: 9 mg/dL (ref 8–23)
CO2: 25 mmol/L (ref 22–32)
Calcium: 8.9 mg/dL (ref 8.9–10.3)
Chloride: 105 mmol/L (ref 98–111)
Creatinine: 0.88 mg/dL (ref 0.61–1.24)
GFR, Estimated: >60 mL/min (ref >60)
Glucose, Bld: 95 mg/dL (ref 70–99)
Potassium: 3.6 mmol/L (ref 3.5–5.1)
Sodium: 138 mmol/L (ref 135–145)
Total Bilirubin: 0.3 mg/dL (ref 0.3–1.2)
Total Protein: 7.3 g/dL (ref 6.5–8.1)

## 2021-02-05 LAB — CBC WITH DIFFERENTIAL (CANCER CENTER ONLY)
Abs Immature Granulocytes: 0.01 10*3/uL (ref 0.00–0.07)
Basophils Absolute: 0 10*3/uL (ref 0.0–0.1)
Basophils Relative: 1 %
Eosinophils Absolute: 0.3 10*3/uL (ref 0.0–0.5)
Eosinophils Relative: 6 %
HCT: 34.6 % — ABNORMAL LOW (ref 39.0–52.0)
Hemoglobin: 10.9 g/dL — ABNORMAL LOW (ref 13.0–17.0)
Immature Granulocytes: 0 %
Lymphocytes Relative: 41 %
Lymphs Abs: 2.5 10*3/uL (ref 0.7–4.0)
MCH: 29.3 pg (ref 26.0–34.0)
MCHC: 31.5 g/dL (ref 30.0–36.0)
MCV: 93 fL (ref 80.0–100.0)
Monocytes Absolute: 0.7 10*3/uL (ref 0.1–1.0)
Monocytes Relative: 12 %
Neutro Abs: 2.4 10*3/uL (ref 1.7–7.7)
Neutrophils Relative %: 40 %
Platelet Count: 298 10*3/uL (ref 150–400)
RBC: 3.72 MIL/uL — ABNORMAL LOW (ref 4.22–5.81)
RDW: 15.8 % — ABNORMAL HIGH (ref 11.5–15.5)
WBC Count: 5.9 10*3/uL (ref 4.0–10.5)
nRBC: 0 % (ref 0.0–0.2)

## 2021-02-05 MED ORDER — HEPARIN SOD (PORK) LOCK FLUSH 100 UNIT/ML IV SOLN
500.0000 [IU] | Freq: Once | INTRAVENOUS | Status: AC
  Administered 2021-02-05: 500 [IU]
  Filled 2021-02-05: qty 5

## 2021-02-05 MED ORDER — SODIUM CHLORIDE 0.9% FLUSH
10.0000 mL | Freq: Once | INTRAVENOUS | Status: AC
  Administered 2021-02-05: 10 mL
  Filled 2021-02-05: qty 10

## 2021-02-05 NOTE — Progress Notes (Deleted)
  [facility] OFFICE PROGRESS NOTE   Diagnosis:   INTERVAL HISTORY:   ***  Objective:  Vital signs in last 24 hours:  There were no vitals taken for this visit.    HEENT: *** Lymphatics: *** Resp: *** Cardio: *** GI: *** Vascular: *** Neuro:***  Skin:***   Portacath/PICC-without erythema  Lab Results:  Lab Results  Component Value Date   WBC 5.7 01/22/2021   HGB 10.3 (L) 01/22/2021   HCT 33.0 (L) 01/22/2021   MCV 91.4 01/22/2021   PLT 390 01/22/2021   NEUTROABS 2.0 01/22/2021    CMP  Lab Results  Component Value Date   NA 137 01/22/2021   K 4.0 01/22/2021   CL 103 01/22/2021   CO2 25 01/22/2021   GLUCOSE 94 01/22/2021   BUN 6 (L) 01/22/2021   CREATININE 0.78 01/22/2021   CALCIUM 8.8 (L) 01/22/2021   PROT 7.5 01/22/2021   ALBUMIN 3.7 01/22/2021   AST 10 (L) 01/22/2021   ALT <6 01/22/2021   ALKPHOS 53 01/22/2021   BILITOT 0.3 01/22/2021   GFRNONAA >60 01/22/2021   GFRAA >60 06/29/2020    Lab Results  Component Value Date   CEA1 3.04 01/07/2021    Lab Results  Component Value Date   INR 1.1 (H) 12/06/2020    Imaging:  No results found.  Medications: I have reviewed the patient's current medications.   Assessment/Plan: 1. Rectal cancer ? Colonoscopy 12/06/2020-partially obstructing mass beginning at 7 cm from the anal verge-biopsy adenocarcinoma ? CTs 12/10/2020-large circumferential fungating mass at the rectosigmoid junction beginning at 9 cm from the anal verge, enlarged perirectal lymph nodes, prominent bilateral iliac and pelvic sidewall nodes-new compared to a CT from 2018, no evidence of metastatic disease to the chest, pulmonary nodules at the right apex-nonspecific ? MRI pelvis 12/24/2020-T4b, N2; distance from tumor to the internal anal sphincter 7.3 cm; findings of potential contained perforation with tract seen potentially extending through the tumor into the mesorectum. ? Plan for total neoadjuvant therapy ? Cycle  1 FOLFOX 01/07/2021 ? Cycle 2 FOLFOX 01/22/2021  2. Pain secondary #1 3. Weight loss 4. Port-A-Cath placement, Dr. Marcello Moores on 01/03/2021     Disposition: ***  Betsy Coder, MD  02/05/2021  8:23 AM

## 2021-02-05 NOTE — Progress Notes (Signed)
  [facility] OFFICE PROGRESS NOTE   Diagnosis: Rectal cancer  INTERVAL HISTORY:   Phillip Brooks completed another cycle of FOLFOX on 01/22/2021.  No nausea/vomiting.  He had cold sensitivity following chemotherapy.  This has improved.  Minimal peripheral numbness at present.  His rectal symptoms have improved.  He has less difficulty with bowel movements.  He continues to have pain with bowel movements, relieved with tramadol.  Objective:  Vital signs in last 24 hours:  Blood pressure 139/88, pulse 81, temperature 97.8 F (36.6 C), temperature source Tympanic, resp. rate 17, height 5\' 6"  (1.676 m), weight 134 lb 12.8 oz (61.1 kg), SpO2 100 %.    HEENT: No thrush or ulcers, mild hyperpigmentation Resp: Lungs clear bilaterally Cardio: Regular rate and rhythm GI: Nontender, no hepatosplenomegaly Vascular: No leg edema  Skin: Dryness and mild hyperpigmentation of the hands  Portacath/PICC-without erythema  Lab Results:  Lab Results  Component Value Date   WBC 5.9 02/05/2021   HGB 10.9 (L) 02/05/2021   HCT 34.6 (L) 02/05/2021   MCV 93.0 02/05/2021   PLT 298 02/05/2021   NEUTROABS 2.4 02/05/2021    CMP  Lab Results  Component Value Date   NA 137 01/22/2021   K 4.0 01/22/2021   CL 103 01/22/2021   CO2 25 01/22/2021   GLUCOSE 94 01/22/2021   BUN 6 (L) 01/22/2021   CREATININE 0.78 01/22/2021   CALCIUM 8.8 (L) 01/22/2021   PROT 7.5 01/22/2021   ALBUMIN 3.7 01/22/2021   AST 10 (L) 01/22/2021   ALT <6 01/22/2021   ALKPHOS 53 01/22/2021   BILITOT 0.3 01/22/2021   GFRNONAA >60 01/22/2021   GFRAA >60 06/29/2020    Lab Results  Component Value Date   CEA1 3.04 01/07/2021    Medications: I have reviewed the patient's current medications.   Assessment/Plan: 1. Rectal cancer ? Colonoscopy 12/06/2020-partially obstructing mass beginning at 7 cm from the anal verge-biopsy adenocarcinoma ? CTs 12/10/2020-large circumferential fungating mass at the rectosigmoid  junction beginning at 9 cm from the anal verge, enlarged perirectal lymph nodes, prominent bilateral iliac and pelvic sidewall nodes-new compared to a CT from 2018, no evidence of metastatic disease to the chest, pulmonary nodules at the right apex-nonspecific ? MRI pelvis 12/24/2020-T4b, N2; distance from tumor to the internal anal sphincter 7.3 cm; findings of potential contained perforation with tract seen potentially extending through the tumor into the mesorectum. ? Plan for total neoadjuvant therapy ? Cycle 1 FOLFOX 01/07/2021 ? Cycle 2 FOLFOX 01/22/2021 ? Cycle 3 FOLFOX 02/06/2021  2. Pain secondary #1 3. Weight loss 4. Port-A-Cath placement, Dr. Marcello Moores on 01/03/2021     Disposition: Phillip Brooks has completed 2 cycles of neoadjuvant FOLFOX.  He has tolerated the chemotherapy well.  He will return for cycle 3 FOLFOX tomorrow.  Phillip Brooks be seen for an office visit prior to cycle 4 FOLFOX in 2 weeks.  Betsy Coder, MD  02/05/2021  10:13 AM

## 2021-02-06 ENCOUNTER — Inpatient Hospital Stay: Payer: Medicare HMO

## 2021-02-06 VITALS — BP 141/88 | HR 90 | Temp 98.1°F | Resp 18

## 2021-02-06 DIAGNOSIS — Z20822 Contact with and (suspected) exposure to covid-19: Secondary | ICD-10-CM | POA: Diagnosis not present

## 2021-02-06 DIAGNOSIS — C20 Malignant neoplasm of rectum: Secondary | ICD-10-CM

## 2021-02-06 DIAGNOSIS — R634 Abnormal weight loss: Secondary | ICD-10-CM | POA: Diagnosis not present

## 2021-02-06 DIAGNOSIS — G893 Neoplasm related pain (acute) (chronic): Secondary | ICD-10-CM | POA: Diagnosis not present

## 2021-02-06 DIAGNOSIS — Z5111 Encounter for antineoplastic chemotherapy: Secondary | ICD-10-CM | POA: Diagnosis not present

## 2021-02-06 MED ORDER — LEUCOVORIN CALCIUM INJECTION 350 MG
400.0000 mg/m2 | Freq: Once | INTRAVENOUS | Status: AC
  Administered 2021-02-06: 684 mg via INTRAVENOUS
  Filled 2021-02-06: qty 34.2

## 2021-02-06 MED ORDER — SODIUM CHLORIDE 0.9 % IV SOLN
2400.0000 mg/m2 | INTRAVENOUS | Status: DC
  Administered 2021-02-06: 4100 mg via INTRAVENOUS
  Filled 2021-02-06: qty 82

## 2021-02-06 MED ORDER — OXALIPLATIN CHEMO INJECTION 100 MG/20ML
85.0000 mg/m2 | Freq: Once | INTRAVENOUS | Status: AC
  Administered 2021-02-06: 145 mg via INTRAVENOUS
  Filled 2021-02-06: qty 10

## 2021-02-06 MED ORDER — PALONOSETRON HCL INJECTION 0.25 MG/5ML
0.2500 mg | Freq: Once | INTRAVENOUS | Status: AC
  Administered 2021-02-06: 0.25 mg via INTRAVENOUS
  Filled 2021-02-06: qty 5

## 2021-02-06 MED ORDER — DEXTROSE 5 % IV SOLN
Freq: Once | INTRAVENOUS | Status: AC
  Filled 2021-02-06: qty 250

## 2021-02-06 MED ORDER — SODIUM CHLORIDE 0.9 % IV SOLN
10.0000 mg | Freq: Once | INTRAVENOUS | Status: AC
  Administered 2021-02-06: 10 mg via INTRAVENOUS
  Filled 2021-02-06: qty 1

## 2021-02-06 MED ORDER — FLUOROURACIL CHEMO INJECTION 2.5 GM/50ML
400.0000 mg/m2 | Freq: Once | INTRAVENOUS | Status: AC
  Administered 2021-02-06: 700 mg via INTRAVENOUS
  Filled 2021-02-06: qty 14

## 2021-02-06 NOTE — Patient Instructions (Signed)
[facility] Discharge Instructions for Patients Receiving Chemotherapy  Today you received the following chemotherapy agents Oxaliplatin (ELOXATIN), Leucovorin & Flourouracil (ADRUCIL).   To help prevent nausea and vomiting after your treatment, we encourage you to take your nausea medication as prescribed.   If you develop nausea and vomiting that is not controlled by your nausea medication, call the clinic.   BELOW ARE SYMPTOMS THAT SHOULD BE REPORTED IMMEDIATELY:  *FEVER GREATER THAN 100.5 F  *CHILLS WITH OR WITHOUT FEVER  NAUSEA AND VOMITING THAT IS NOT CONTROLLED WITH YOUR NAUSEA MEDICATION  *UNUSUAL SHORTNESS OF BREATH  *UNUSUAL BRUISING OR BLEEDING  TENDERNESS IN MOUTH AND THROAT WITH OR WITHOUT PRESENCE OF ULCERS  *URINARY PROBLEMS  *BOWEL PROBLEMS  UNUSUAL RASH Items with * indicate a potential emergency and should be followed up as soon as possible.  Feel free to call the clinic should you have any questions or concerns at The clinic phone number is [phone].  Please show the [facility] at check-in to the Emergency Department and triage nurse.

## 2021-02-08 ENCOUNTER — Inpatient Hospital Stay: Payer: Medicare HMO

## 2021-02-08 ENCOUNTER — Other Ambulatory Visit: Payer: Self-pay

## 2021-02-08 VITALS — BP 118/83 | HR 90 | Temp 97.7°F | Resp 18

## 2021-02-08 DIAGNOSIS — C20 Malignant neoplasm of rectum: Secondary | ICD-10-CM | POA: Diagnosis not present

## 2021-02-08 DIAGNOSIS — Z5111 Encounter for antineoplastic chemotherapy: Secondary | ICD-10-CM | POA: Diagnosis not present

## 2021-02-08 DIAGNOSIS — R634 Abnormal weight loss: Secondary | ICD-10-CM | POA: Diagnosis not present

## 2021-02-08 DIAGNOSIS — G893 Neoplasm related pain (acute) (chronic): Secondary | ICD-10-CM | POA: Diagnosis not present

## 2021-02-08 MED ORDER — SODIUM CHLORIDE 0.9% FLUSH
10.0000 mL | INTRAVENOUS | Status: DC | PRN
  Administered 2021-02-08: 10 mL
  Filled 2021-02-08: qty 10

## 2021-02-08 MED ORDER — HEPARIN SOD (PORK) LOCK FLUSH 100 UNIT/ML IV SOLN
500.0000 [IU] | Freq: Once | INTRAVENOUS | Status: AC | PRN
  Administered 2021-02-08: 500 [IU]
  Filled 2021-02-08: qty 5

## 2021-02-10 DIAGNOSIS — Z20822 Contact with and (suspected) exposure to covid-19: Secondary | ICD-10-CM | POA: Diagnosis not present

## 2021-02-13 DIAGNOSIS — Z20822 Contact with and (suspected) exposure to covid-19: Secondary | ICD-10-CM | POA: Diagnosis not present

## 2021-02-16 ENCOUNTER — Other Ambulatory Visit: Payer: Self-pay | Admitting: Oncology

## 2021-02-18 ENCOUNTER — Other Ambulatory Visit: Payer: Self-pay | Admitting: Nurse Practitioner

## 2021-02-18 DIAGNOSIS — C20 Malignant neoplasm of rectum: Secondary | ICD-10-CM

## 2021-02-19 ENCOUNTER — Other Ambulatory Visit: Payer: Self-pay

## 2021-02-19 ENCOUNTER — Inpatient Hospital Stay (HOSPITAL_BASED_OUTPATIENT_CLINIC_OR_DEPARTMENT_OTHER): Payer: Medicare HMO | Admitting: Nurse Practitioner

## 2021-02-19 ENCOUNTER — Inpatient Hospital Stay: Payer: Medicare HMO

## 2021-02-19 ENCOUNTER — Inpatient Hospital Stay: Payer: Medicare HMO | Attending: Oncology

## 2021-02-19 ENCOUNTER — Encounter: Payer: Self-pay | Admitting: Nurse Practitioner

## 2021-02-19 VITALS — BP 134/86 | HR 84 | Temp 97.8°F | Resp 18 | Ht 66.0 in | Wt 138.2 lb

## 2021-02-19 DIAGNOSIS — Z5111 Encounter for antineoplastic chemotherapy: Secondary | ICD-10-CM | POA: Diagnosis not present

## 2021-02-19 DIAGNOSIS — G893 Neoplasm related pain (acute) (chronic): Secondary | ICD-10-CM | POA: Insufficient documentation

## 2021-02-19 DIAGNOSIS — G620 Drug-induced polyneuropathy: Secondary | ICD-10-CM | POA: Insufficient documentation

## 2021-02-19 DIAGNOSIS — C7800 Secondary malignant neoplasm of unspecified lung: Secondary | ICD-10-CM | POA: Insufficient documentation

## 2021-02-19 DIAGNOSIS — C20 Malignant neoplasm of rectum: Secondary | ICD-10-CM

## 2021-02-19 DIAGNOSIS — T451X5A Adverse effect of antineoplastic and immunosuppressive drugs, initial encounter: Secondary | ICD-10-CM | POA: Insufficient documentation

## 2021-02-19 DIAGNOSIS — C19 Malignant neoplasm of rectosigmoid junction: Secondary | ICD-10-CM | POA: Insufficient documentation

## 2021-02-19 LAB — CBC WITH DIFFERENTIAL (CANCER CENTER ONLY)
Abs Immature Granulocytes: 0.01 10*3/uL (ref 0.00–0.07)
Basophils Absolute: 0 10*3/uL (ref 0.0–0.1)
Basophils Relative: 0 %
Eosinophils Absolute: 0.3 10*3/uL (ref 0.0–0.5)
Eosinophils Relative: 6 %
HCT: 32.1 % — ABNORMAL LOW (ref 39.0–52.0)
Hemoglobin: 10.2 g/dL — ABNORMAL LOW (ref 13.0–17.0)
Immature Granulocytes: 0 %
Lymphocytes Relative: 55 %
Lymphs Abs: 2.8 10*3/uL (ref 0.7–4.0)
MCH: 29.9 pg (ref 26.0–34.0)
MCHC: 31.8 g/dL (ref 30.0–36.0)
MCV: 94.1 fL (ref 80.0–100.0)
Monocytes Absolute: 0.6 10*3/uL (ref 0.1–1.0)
Monocytes Relative: 11 %
Neutro Abs: 1.4 10*3/uL — ABNORMAL LOW (ref 1.7–7.7)
Neutrophils Relative %: 28 %
Platelet Count: 232 10*3/uL (ref 150–400)
RBC: 3.41 MIL/uL — ABNORMAL LOW (ref 4.22–5.81)
RDW: 15.9 % — ABNORMAL HIGH (ref 11.5–15.5)
WBC Count: 5.1 10*3/uL (ref 4.0–10.5)
nRBC: 0 % (ref 0.0–0.2)

## 2021-02-19 LAB — CMP (CANCER CENTER ONLY)
ALT: 6 U/L (ref 0–44)
AST: 13 U/L — ABNORMAL LOW (ref 15–41)
Albumin: 3.7 g/dL (ref 3.5–5.0)
Alkaline Phosphatase: 46 U/L (ref 38–126)
Anion gap: 8 (ref 5–15)
BUN: 7 mg/dL — ABNORMAL LOW (ref 8–23)
CO2: 24 mmol/L (ref 22–32)
Calcium: 8.5 mg/dL — ABNORMAL LOW (ref 8.9–10.3)
Chloride: 108 mmol/L (ref 98–111)
Creatinine: 0.83 mg/dL (ref 0.61–1.24)
GFR, Estimated: >60 mL/min (ref >60)
Glucose, Bld: 87 mg/dL (ref 70–99)
Potassium: 3.6 mmol/L (ref 3.5–5.1)
Sodium: 140 mmol/L (ref 135–145)
Total Bilirubin: 0.2 mg/dL — ABNORMAL LOW (ref 0.3–1.2)
Total Protein: 6.5 g/dL (ref 6.5–8.1)

## 2021-02-19 MED ORDER — DEXTROSE 5 % IV SOLN
Freq: Once | INTRAVENOUS | Status: AC
  Filled 2021-02-19: qty 250

## 2021-02-19 MED ORDER — DEXAMETHASONE SODIUM PHOSPHATE 100 MG/10ML IJ SOLN
10.0000 mg | Freq: Once | INTRAMUSCULAR | Status: AC
  Administered 2021-02-19: 10 mg via INTRAVENOUS
  Filled 2021-02-19: qty 1

## 2021-02-19 MED ORDER — PALONOSETRON HCL INJECTION 0.25 MG/5ML
0.2500 mg | Freq: Once | INTRAVENOUS | Status: AC
  Administered 2021-02-19: 0.25 mg via INTRAVENOUS
  Filled 2021-02-19: qty 5

## 2021-02-19 MED ORDER — SODIUM CHLORIDE 0.9 % IV SOLN
2400.0000 mg/m2 | INTRAVENOUS | Status: DC
  Administered 2021-02-19: 4100 mg via INTRAVENOUS
  Filled 2021-02-19: qty 82

## 2021-02-19 MED ORDER — FLUOROURACIL CHEMO INJECTION 2.5 GM/50ML
400.0000 mg/m2 | Freq: Once | INTRAVENOUS | Status: AC
  Administered 2021-02-19: 700 mg via INTRAVENOUS
  Filled 2021-02-19: qty 14

## 2021-02-19 MED ORDER — OXALIPLATIN CHEMO INJECTION 100 MG/20ML
65.0000 mg/m2 | Freq: Once | INTRAVENOUS | Status: AC
  Administered 2021-02-19: 110 mg via INTRAVENOUS
  Filled 2021-02-19: qty 20

## 2021-02-19 MED ORDER — TRAMADOL HCL 50 MG PO TABS: 50.0000 mg | ORAL_TABLET | Freq: Two times a day (BID) | ORAL | 0 refills | Status: DC | PRN

## 2021-02-19 MED ORDER — LEUCOVORIN CALCIUM INJECTION 350 MG
400.0000 mg/m2 | Freq: Once | INTRAVENOUS | Status: AC
  Administered 2021-02-19: 684 mg via INTRAVENOUS
  Filled 2021-02-19: qty 34.2

## 2021-02-19 NOTE — Patient Instructions (Signed)
[facility]    Discharge Instructions:  Thank you for choosing [facility] to provide your oncology and hematology care.   If you have a lab appointment with the [facility], please go directly to the [facility] and check in at the registration area.   Wear comfortable clothing and clothing appropriate for easy access to any Portacath or PICC line.   We strive to give you quality time with your provider. You may need to reschedule your appointment if you arrive late (15 or more minutes).  Arriving late affects you and other patients whose appointments are after yours.  Also, if you miss three or more appointments without notifying the office, you may be dismissed from the clinic at the provider's discretion.      For prescription refill requests, have your pharmacy contact our office and allow 72 hours for refills to be completed.    Today you received the following chemotherapy and/or immunotherapy agents Oxaliplatin (ELOXATIN), Leucovorin, Flourouracil (ADRUCIL).   To help prevent nausea and vomiting after your treatment, we encourage you to take your nausea medication as directed.  BELOW ARE SYMPTOMS THAT SHOULD BE REPORTED IMMEDIATELY: . *FEVER GREATER THAN 100.4 F (38 C) OR HIGHER . *CHILLS OR SWEATING . *NAUSEA AND VOMITING THAT IS NOT CONTROLLED WITH YOUR NAUSEA MEDICATION . *UNUSUAL SHORTNESS OF BREATH . *UNUSUAL BRUISING OR BLEEDING . *URINARY PROBLEMS (pain or burning when urinating, or frequent urination) . *BOWEL PROBLEMS (unusual diarrhea, constipation, pain near the anus) . TENDERNESS IN MOUTH AND THROAT WITH OR WITHOUT PRESENCE OF ULCERS (sore throat, sores in mouth, or a toothache) . UNUSUAL RASH, SWELLING OR PAIN  . UNUSUAL VAGINAL DISCHARGE OR ITCHING   Items with * indicate a potential emergency and should be followed up as soon as possible or go to the Emergency Department if any problems should occur.  Please show  the CHEMOTHERAPY ALERT CARD or IMMUNOTHERAPY ALERT CARD at check-in to the Emergency Department and triage nurse.  Should you have questions after your visit or need to cancel or reschedule your appointment, please contact [facility]  Dept: [phone]  and follow the prompts.  Office hours are 8:00 a.m. to 4:30 p.m. Monday - Friday. Please note that voicemails left after 4:00 p.m. may not be returned until the following business day.  We are closed weekends and major holidays. You have access to a nurse at all times for urgent questions. Please call the main number to the clinic Dept: [phone] and follow the prompts.   For any non-urgent questions, you may also contact your provider using MyChart. We now offer e-Visits for anyone 59 and older to request care online for non-urgent symptoms. For details visit [email or web address].   Also download the MyChart app! Go to the app store, search "MyChart", open the app, select [facility], and log in with your MyChart username and password.  Due to Covid, a mask is required upon entering the hospital/clinic. If you do not have a mask, one will be given to you upon arrival. For doctor visits, patients may have 1 support person aged 55 or older with them. For treatment visits, patients cannot have anyone with them due to current Covid guidelines and our immunocompromised population.

## 2021-02-19 NOTE — Addendum Note (Signed)
Addended by: Ned Card K on: 02/19/2021 10:40 AM   Modules accepted: Orders

## 2021-02-19 NOTE — Progress Notes (Signed)
Dose reduced oxaliplatin today to 65 mg/m2 per Ned Card for ANC 1.4. Will add neulasta if authorized.  Roselind Messier, PharmD

## 2021-02-19 NOTE — Progress Notes (Addendum)
[facility] OFFICE PROGRESS NOTE   Diagnosis:  Rectal cancer  INTERVAL HISTORY:   Phillip Brooks returns as scheduled.  He completed cycle 3 FOLFOX 02/06/2021.  He had a single episode of nausea/vomiting last week when he became constipated.  Bowels now moving regularly.  No diarrhea.  He notes less rectal bleeding and less pain.  He has a good appetite.  He is gaining weight.  Cold sensitivity lasted 2 or 3 days.  No persistent neuropathy symptoms.  He reports "ringing" in his ears for several years.  No hearing loss.  Objective:  Vital signs in last 24 hours:  Blood pressure 134/86, pulse 84, temperature 97.8 F (36.6 C), temperature source Oral, resp. rate 18, height 5\' 6"  (1.676 m), weight 138 lb 3.2 oz (62.7 kg), SpO2 100 %.    HEENT: No thrush or ulcers. Resp: Lungs clear bilaterally. Cardio: Regular rate and rhythm. GI: Abdomen soft and nontender.  No hepatomegaly. Vascular: No leg edema. Neuro: Vibratory sense intact over the fingertips per tuning fork exam. Skin: Palms dry appearing, no erythema. Port-A-Cath without erythema.   Lab Results:  Lab Results  Component Value Date   WBC 5.1 02/19/2021   HGB 10.2 (L) 02/19/2021   HCT 32.1 (L) 02/19/2021   MCV 94.1 02/19/2021   PLT 232 02/19/2021   NEUTROABS 1.4 (L) 02/19/2021    Imaging:  No results found.  Medications: I have reviewed the patient's current medications.  Assessment/Plan: 1. Rectal cancer ? Colonoscopy 12/06/2020-partially obstructing mass beginning at 7 cm from the anal verge-biopsy adenocarcinoma ? CTs 12/10/2020-large circumferential fungating mass at the rectosigmoid junction beginning at 9 cm from the anal verge, enlarged perirectal lymph nodes, prominent bilateral iliac and pelvic sidewall nodes-new compared to a CT from 2018, no evidence of metastatic disease to the chest, pulmonary nodules at the right apex-nonspecific ? MRI pelvis 12/24/2020-T4b, N2; distance from tumor to the  internal anal sphincter 7.3 cm; findings of potential contained perforation with tract seen potentially extending through the tumor into the mesorectum. ? Plan for total neoadjuvant therapy ? Cycle 1 FOLFOX 01/07/2021 ? Cycle 2 FOLFOX 01/22/2021 ? Cycle 3 FOLFOX 02/06/2021 ? Cycle 4 FOLFOX 02/19/2021 (oxaliplatin dose reduced to 65 mg per metered squared due to mild neutropenia)  2. Pain secondary #1-improved 3. Weight loss-improved 4. Port-A-Cath placement, Dr. Marcello Moores on 01/03/2021   Disposition: Phillip Brooks appears stable.  He has completed 3 cycles of FOLFOX.  Overall he is tolerating chemotherapy well.  Review of the CBC from today shows mild neutropenia.  Plan to proceed with cycle 4 FOLFOX today as scheduled.  Initial plan was to give white cell growth factor support on day of pump discontinuation.  Unable to secure prior authorization with his insurance company in a timely manner.  Since unable to confirm that we can give the white cell growth factor support with this cycle I will dose reduce the oxaliplatin to 65 mg per metered squared and go ahead with treatment.  Neutropenic precautions reviewed.  He understands to contact the office with fever, chills, other signs of infection.  He agrees with the above.  He will return for lab, follow-up and the next cycle of FOLFOX in 2 weeks.  He will contact the office in the interim as outlined above or with any other problems.  Addendum 12:15 PM-white cell growth factor support approved.  He will receive on the day of pump discontinuation, 02/21/2021.  Potential toxicities reviewed including bone pain, rash, splenic rupture.  He agrees to proceed.  Ned Card ANP/GNP-BC   02/19/2021  8:51 AM

## 2021-02-20 DIAGNOSIS — Z20822 Contact with and (suspected) exposure to covid-19: Secondary | ICD-10-CM | POA: Diagnosis not present

## 2021-02-21 ENCOUNTER — Inpatient Hospital Stay: Payer: Medicare HMO

## 2021-02-21 ENCOUNTER — Other Ambulatory Visit: Payer: Self-pay

## 2021-02-21 ENCOUNTER — Telehealth: Payer: Self-pay

## 2021-02-21 VITALS — BP 110/76 | HR 76 | Temp 98.5°F | Resp 18

## 2021-02-21 DIAGNOSIS — C7800 Secondary malignant neoplasm of unspecified lung: Secondary | ICD-10-CM | POA: Diagnosis not present

## 2021-02-21 DIAGNOSIS — G620 Drug-induced polyneuropathy: Secondary | ICD-10-CM | POA: Diagnosis not present

## 2021-02-21 DIAGNOSIS — Z5111 Encounter for antineoplastic chemotherapy: Secondary | ICD-10-CM | POA: Diagnosis not present

## 2021-02-21 DIAGNOSIS — T451X5A Adverse effect of antineoplastic and immunosuppressive drugs, initial encounter: Secondary | ICD-10-CM | POA: Diagnosis not present

## 2021-02-21 DIAGNOSIS — G893 Neoplasm related pain (acute) (chronic): Secondary | ICD-10-CM | POA: Diagnosis not present

## 2021-02-21 DIAGNOSIS — C19 Malignant neoplasm of rectosigmoid junction: Secondary | ICD-10-CM | POA: Diagnosis not present

## 2021-02-21 DIAGNOSIS — C20 Malignant neoplasm of rectum: Secondary | ICD-10-CM | POA: Diagnosis not present

## 2021-02-21 MED ORDER — HEPARIN SOD (PORK) LOCK FLUSH 100 UNIT/ML IV SOLN
500.0000 [IU] | Freq: Once | INTRAVENOUS | Status: AC | PRN
  Administered 2021-02-21: 500 [IU]
  Filled 2021-02-21: qty 5

## 2021-02-21 MED ORDER — SODIUM CHLORIDE 0.9% FLUSH
10.0000 mL | INTRAVENOUS | Status: DC | PRN
  Administered 2021-02-21: 10 mL
  Filled 2021-02-21: qty 10

## 2021-02-21 MED ORDER — SODIUM CHLORIDE 0.9% FLUSH
3.0000 mL | INTRAVENOUS | Status: DC | PRN
  Filled 2021-02-21: qty 10

## 2021-02-21 MED ORDER — PEGFILGRASTIM-CBQV 6 MG/0.6ML ~~LOC~~ SOSY
6.0000 mg | PREFILLED_SYRINGE | Freq: Once | SUBCUTANEOUS | Status: AC
  Administered 2021-02-21: 6 mg via SUBCUTANEOUS

## 2021-02-21 NOTE — Telephone Encounter (Signed)
Pt called several times no response and no return call pt missed his pump stop appt this day at 11am

## 2021-02-21 NOTE — Patient Instructions (Signed)
[facility]   Discharge Instructions: Thank you for choosing [facility] to provide your oncology and hematology care.   If you have a lab appointment with the [facility], please go directly to the [facility] and check in at the registration area.   Wear comfortable clothing and clothing appropriate for easy access to any Portacath or PICC line.   We strive to give you quality time with your provider. You may need to reschedule your appointment if you arrive late (15 or more minutes).  Arriving late affects you and other patients whose appointments are after yours.  Also, if you miss three or more appointments without notifying the office, you may be dismissed from the clinic at the provider's discretion.      For prescription refill requests, have your pharmacy contact our office and allow 72 hours for refills to be completed.    Today you received the following: Udenyca   To help prevent nausea and vomiting after your treatment, we encourage you to take your nausea medication as directed.  BELOW ARE SYMPTOMS THAT SHOULD BE REPORTED IMMEDIATELY: . *FEVER GREATER THAN 100.4 F (38 C) OR HIGHER . *CHILLS OR SWEATING . *NAUSEA AND VOMITING THAT IS NOT CONTROLLED WITH YOUR NAUSEA MEDICATION . *UNUSUAL SHORTNESS OF BREATH . *UNUSUAL BRUISING OR BLEEDING . *URINARY PROBLEMS (pain or burning when urinating, or frequent urination) . *BOWEL PROBLEMS (unusual diarrhea, constipation, pain near the anus) . TENDERNESS IN MOUTH AND THROAT WITH OR WITHOUT PRESENCE OF ULCERS (sore throat, sores in mouth, or a toothache) . UNUSUAL RASH, SWELLING OR PAIN  . UNUSUAL VAGINAL DISCHARGE OR ITCHING   Items with * indicate a potential emergency and should be followed up as soon as possible or go to the Emergency Department if any problems should occur.  Please show the CHEMOTHERAPY ALERT CARD or IMMUNOTHERAPY ALERT CARD at check-in to the Emergency Department  and triage nurse.  Should you have questions after your visit or need to cancel or reschedule your appointment, please contact [facility]  Dept: [phone]  and follow the prompts.  Office hours are 8:00 a.m. to 4:30 p.m. Monday - Friday. Please note that voicemails left after 4:00 p.m. may not be returned until the following business day.  We are closed weekends and major holidays. You have access to a nurse at all times for urgent questions. Please call the main number to the clinic Dept: [phone] and follow the prompts.   For any non-urgent questions, you may also contact your provider using MyChart. We now offer e-Visits for anyone 37 and older to request care online for non-urgent symptoms. For details visit [email or web address].   Also download the MyChart app! Go to the app store, search "MyChart", open the app, select [facility], and log in with your MyChart username and password.  Due to Covid, a mask is required upon entering the hospital/clinic. If you do not have a mask, one will be given to you upon arrival. For doctor visits, patients may have 1 support person aged 40 or older with them. For treatment visits, patients cannot have anyone with them due to current Covid guidelines and our immunocompromised population.    Pegfilgrastim injection What is this medicine? PEGFILGRASTIM (PEG fil gra stim) is a long-acting granulocyte colony-stimulating factor that stimulates the growth of neutrophils, a type of white blood cell important in the body's fight against infection. It is used to reduce the incidence of  fever and infection in patients with certain types of cancer who are receiving chemotherapy that affects the bone marrow, and to increase survival after being exposed to high doses of radiation. This medicine may be used for other purposes; ask your health care provider or pharmacist if you have questions. COMMON BRAND NAME(S): Rexene Edison, Ziextenzo What should I tell my health care provider before I take this medicine? They need to know if you have any of these conditions:  kidney disease  latex allergy  ongoing radiation therapy  sickle cell disease  skin reactions to acrylic adhesives (On-Body Injector only)  an unusual or allergic reaction to pegfilgrastim, filgrastim, other medicines, foods, dyes, or preservatives  pregnant or trying to get pregnant  breast-feeding How should I use this medicine? This medicine is for injection under the skin. If you get this medicine at home, you will be taught how to prepare and give the pre-filled syringe or how to use the On-body Injector. Refer to the patient Instructions for Use for detailed instructions. Use exactly as directed. Tell your healthcare provider immediately if you suspect that the On-body Injector may not have performed as intended or if you suspect the use of the On-body Injector resulted in a missed or partial dose. It is important that you put your used needles and syringes in a special sharps container. Do not put them in a trash can. If you do not have a sharps container, call your pharmacist or healthcare provider to get one. Talk to your pediatrician regarding the use of this medicine in children. While this drug may be prescribed for selected conditions, precautions do apply. Overdosage: If you think you have taken too much of this medicine contact a poison control center or emergency room at once. NOTE: This medicine is only for you. Do not share this medicine with others. What if I miss a dose? It is important not to miss your dose. Call your doctor or health care professional if you miss your dose. If you miss a dose due to an On-body Injector failure or leakage, a new dose should be administered as soon as possible using a single prefilled syringe for manual use. What may interact with this medicine? Interactions have not been  studied. This list may not describe all possible interactions. Give your health care provider a list of all the medicines, herbs, non-prescription drugs, or dietary supplements you use. Also tell them if you smoke, drink alcohol, or use illegal drugs. Some items may interact with your medicine. What should I watch for while using this medicine? Your condition will be monitored carefully while you are receiving this medicine. You may need blood work done while you are taking this medicine. Talk to your health care provider about your risk of cancer. You may be more at risk for certain types of cancer if you take this medicine. If you are going to need a MRI, CT scan, or other procedure, tell your doctor that you are using this medicine (On-Body Injector only). What side effects may I notice from receiving this medicine? Side effects that you should report to your doctor or health care professional as soon as possible:  allergic reactions (skin rash, itching or hives, swelling of the face, lips, or tongue)  back pain  dizziness  fever  pain, redness, or irritation at site where injected  pinpoint red spots on the skin  red or dark-brown urine  shortness of breath or breathing problems  stomach or  side pain, or pain at the shoulder  swelling  tiredness  trouble passing urine or change in the amount of urine  unusual bruising or bleeding Side effects that usually do not require medical attention (report to your doctor or health care professional if they continue or are bothersome):  bone pain  muscle pain This list may not describe all possible side effects. Call your doctor for medical advice about side effects. You may report side effects to FDA at 1-800-FDA-1088. Where should I keep my medicine? Keep out of the reach of children. If you are using this medicine at home, you will be instructed on how to store it. Throw away any unused medicine after the expiration date on the  label. NOTE: This sheet is a summary. It may not cover all possible information. If you have questions about this medicine, talk to your doctor, pharmacist, or health care provider.  2021 Elsevier/Gold Standard (2019-10-27 13:20:51)

## 2021-02-21 NOTE — Telephone Encounter (Signed)
Called message left concerning missed appt pt will need to arrive to [facility] if not here by 1500 to de access

## 2021-03-02 ENCOUNTER — Other Ambulatory Visit: Payer: Self-pay | Admitting: Oncology

## 2021-03-05 ENCOUNTER — Inpatient Hospital Stay (HOSPITAL_BASED_OUTPATIENT_CLINIC_OR_DEPARTMENT_OTHER): Payer: Medicare HMO | Admitting: Oncology

## 2021-03-05 ENCOUNTER — Inpatient Hospital Stay: Payer: Medicare HMO

## 2021-03-05 ENCOUNTER — Encounter: Payer: Self-pay | Admitting: Nutrition

## 2021-03-05 ENCOUNTER — Other Ambulatory Visit: Payer: Self-pay

## 2021-03-05 ENCOUNTER — Inpatient Hospital Stay: Payer: Medicare HMO | Admitting: Oncology

## 2021-03-05 VITALS — BP 130/92 | HR 87 | Temp 97.8°F | Resp 19 | Ht 66.0 in | Wt 133.4 lb

## 2021-03-05 DIAGNOSIS — C20 Malignant neoplasm of rectum: Secondary | ICD-10-CM

## 2021-03-05 DIAGNOSIS — Z5111 Encounter for antineoplastic chemotherapy: Secondary | ICD-10-CM | POA: Diagnosis not present

## 2021-03-05 DIAGNOSIS — C7800 Secondary malignant neoplasm of unspecified lung: Secondary | ICD-10-CM | POA: Diagnosis not present

## 2021-03-05 DIAGNOSIS — G893 Neoplasm related pain (acute) (chronic): Secondary | ICD-10-CM | POA: Diagnosis not present

## 2021-03-05 DIAGNOSIS — T451X5A Adverse effect of antineoplastic and immunosuppressive drugs, initial encounter: Secondary | ICD-10-CM

## 2021-03-05 DIAGNOSIS — G620 Drug-induced polyneuropathy: Secondary | ICD-10-CM

## 2021-03-05 DIAGNOSIS — C19 Malignant neoplasm of rectosigmoid junction: Secondary | ICD-10-CM | POA: Diagnosis not present

## 2021-03-05 LAB — CBC WITH DIFFERENTIAL (CANCER CENTER ONLY)
Abs Immature Granulocytes: 0.1 K/uL — ABNORMAL HIGH (ref 0.00–0.07)
Basophils Absolute: 0 K/uL (ref 0.0–0.1)
Basophils Relative: 0 %
Eosinophils Absolute: 0.3 K/uL (ref 0.0–0.5)
Eosinophils Relative: 2 %
HCT: 37.9 % — ABNORMAL LOW (ref 39.0–52.0)
Hemoglobin: 12 g/dL — ABNORMAL LOW (ref 13.0–17.0)
Immature Granulocytes: 1 %
Lymphocytes Relative: 32 %
Lymphs Abs: 3.9 K/uL (ref 0.7–4.0)
MCH: 30.2 pg (ref 26.0–34.0)
MCHC: 31.7 g/dL (ref 30.0–36.0)
MCV: 95.2 fL (ref 80.0–100.0)
Monocytes Absolute: 0.7 K/uL (ref 0.1–1.0)
Monocytes Relative: 6 %
Neutro Abs: 7.2 K/uL (ref 1.7–7.7)
Neutrophils Relative %: 59 %
Platelet Count: 163 K/uL (ref 150–400)
RBC: 3.98 MIL/uL — ABNORMAL LOW (ref 4.22–5.81)
RDW: 16.2 % — ABNORMAL HIGH (ref 11.5–15.5)
WBC Count: 12.3 K/uL — ABNORMAL HIGH (ref 4.0–10.5)
nRBC: 0 % (ref 0.0–0.2)

## 2021-03-05 LAB — CMP (CANCER CENTER ONLY)
ALT: 9 U/L (ref 0–44)
AST: 19 U/L (ref 15–41)
Albumin: 4 g/dL (ref 3.5–5.0)
Alkaline Phosphatase: 80 U/L (ref 38–126)
Anion gap: 10 (ref 5–15)
BUN: 11 mg/dL (ref 8–23)
CO2: 23 mmol/L (ref 22–32)
Calcium: 8.8 mg/dL — ABNORMAL LOW (ref 8.9–10.3)
Chloride: 105 mmol/L (ref 98–111)
Creatinine: 0.88 mg/dL (ref 0.61–1.24)
GFR, Estimated: >60 mL/min (ref >60)
Glucose, Bld: 79 mg/dL (ref 70–99)
Potassium: 3.9 mmol/L (ref 3.5–5.1)
Sodium: 138 mmol/L (ref 135–145)
Total Bilirubin: 0.3 mg/dL (ref 0.3–1.2)
Total Protein: 7.3 g/dL (ref 6.5–8.1)

## 2021-03-05 MED ORDER — SODIUM CHLORIDE 0.9% FLUSH
10.0000 mL | INTRAVENOUS | Status: DC | PRN
  Administered 2021-03-05: 10 mL
  Filled 2021-03-05: qty 10

## 2021-03-05 MED ORDER — FLUOROURACIL CHEMO INJECTION 2.5 GM/50ML
400.0000 mg/m2 | Freq: Once | INTRAVENOUS | Status: AC
  Administered 2021-03-05: 700 mg via INTRAVENOUS
  Filled 2021-03-05: qty 14

## 2021-03-05 MED ORDER — LEUCOVORIN CALCIUM INJECTION 350 MG
400.0000 mg/m2 | Freq: Once | INTRAVENOUS | Status: AC
  Administered 2021-03-05: 684 mg via INTRAVENOUS
  Filled 2021-03-05: qty 34.2

## 2021-03-05 MED ORDER — SODIUM CHLORIDE 0.9 % IV SOLN
2400.0000 mg/m2 | INTRAVENOUS | Status: DC
  Administered 2021-03-05: 4100 mg via INTRAVENOUS
  Filled 2021-03-05: qty 82

## 2021-03-05 MED ORDER — SODIUM CHLORIDE 0.9 % IV SOLN
10.0000 mg | Freq: Once | INTRAVENOUS | Status: AC
  Administered 2021-03-05: 10 mg via INTRAVENOUS
  Filled 2021-03-05: qty 1

## 2021-03-05 MED ORDER — PALONOSETRON HCL INJECTION 0.25 MG/5ML
0.2500 mg | Freq: Once | INTRAVENOUS | Status: AC
  Administered 2021-03-05: 0.25 mg via INTRAVENOUS
  Filled 2021-03-05: qty 5

## 2021-03-05 MED ORDER — DEXTROSE 5 % IV SOLN
Freq: Once | INTRAVENOUS | Status: AC
  Filled 2021-03-05: qty 250

## 2021-03-05 MED ORDER — OXALIPLATIN CHEMO INJECTION 100 MG/20ML
85.0000 mg/m2 | Freq: Once | INTRAVENOUS | Status: AC
  Administered 2021-03-05: 145 mg via INTRAVENOUS
  Filled 2021-03-05: qty 29

## 2021-03-05 NOTE — Progress Notes (Signed)
  [facility] OFFICE PROGRESS NOTE   Diagnosis: Rectal cancer  INTERVAL HISTORY:   Phillip Brooks completed another cycle of FOLFOX on 02/19/2021.  No nausea/vomiting or diarrhea.  Cold sensitivity lasted for several days following chemotherapy.  No neuropathy symptoms at present.  Rectal pain has improved.  Rectal bleeding has resolved.  He is having bowel movements.  Good appetite.  He reports recent pain in the left knee.  This has improved.  He had mild back pain following Udenyca.  Objective:  Vital signs in last 24 hours:  Blood pressure (!) 130/92, pulse 87, temperature 97.8 F (36.6 C), temperature source Oral, resp. rate 19, height 5\' 6"  (1.676 m), weight 133 lb 6.4 oz (60.5 kg), SpO2 98 %.    HEENT: No thrush or ulcers Resp: Lungs clear bilaterally Cardio: Regular rate and rhythm GI: No hepatosplenomegaly, nontender Vascular: No leg edema or erythema Neuro: Mild loss of vibratory sense in the fingertips bilaterally Skin: Dryness and hyperpigmentation of the hands  Portacath/PICC-without erythema  Lab Results:  Lab Results  Component Value Date   WBC 12.3 (H) 03/05/2021   HGB 12.0 (L) 03/05/2021   HCT 37.9 (L) 03/05/2021   MCV 95.2 03/05/2021   PLT 163 03/05/2021   NEUTROABS 7.2 03/05/2021    CMP  Lab Results  Component Value Date   NA 138 03/05/2021   K 3.9 03/05/2021   CL 105 03/05/2021   CO2 23 03/05/2021   GLUCOSE 79 03/05/2021   BUN 11 03/05/2021   CREATININE 0.88 03/05/2021   CALCIUM 8.8 (L) 03/05/2021   PROT 7.3 03/05/2021   ALBUMIN 4.0 03/05/2021   AST 19 03/05/2021   ALT 9 03/05/2021   ALKPHOS 80 03/05/2021   BILITOT 0.3 03/05/2021   GFRNONAA >60 03/05/2021   GFRAA >60 06/29/2020    Lab Results  Component Value Date   CEA1 3.04 01/07/2021    Medications: I have reviewed the patient's current medications.   Assessment/Plan: 1. Rectal cancer ? Colonoscopy 12/06/2020-partially obstructing mass beginning at 7 cm from the anal  verge-biopsy adenocarcinoma ? CTs 12/10/2020-large circumferential fungating mass at the rectosigmoid junction beginning at 9 cm from the anal verge, enlarged perirectal lymph nodes, prominent bilateral iliac and pelvic sidewall nodes-new compared to a CT from 2018, no evidence of metastatic disease to the chest, pulmonary nodules at the right apex-nonspecific ? MRI pelvis 12/24/2020-T4b, N2; distance from tumor to the internal anal sphincter 7.3 cm; findings of potential contained perforation with tract seen potentially extending through the tumor into the mesorectum. ? Plan for total neoadjuvant therapy ? Cycle 1 FOLFOX 01/07/2021 ? Cycle 2 FOLFOX 01/22/2021 ? Cycle 3 FOLFOX 02/06/2021 ? Cycle 4 FOLFOX 02/19/2021 (oxaliplatin dose reduced to 65 mg per metered squared due to mild neutropenia) ? Cycle 5 FOLFOX 03/05/2021, Udenyca  2. Pain secondary #1-improved 3. Weight loss-improved 4. Port-A-Cath placement, Dr. 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     Disposition: Phillip Brooks has completed 4 cycles of neoadjuvant FOLFOX.  He is tolerating the chemotherapy well.  His rectal symptoms have improved.  He will complete cycle 5 today.  We will monitor for development of oxaliplatin neuropathy.  Phillip Brooks will return for an office visit and cycle 6 chemotherapy in 2 weeks.  Betsy Coder, MD  03/05/2021  9:16 AM

## 2021-03-05 NOTE — Patient Instructions (Signed)
[facility]  Discharge Instructions: Thank you for choosing [facility] to provide your oncology and hematology care.   If you have a lab appointment with the [facility] , please go directly to the [facility] and check in at the registration area.   Wear comfortable clothing and clothing appropriate for easy access to any Portacath or PICC line.   We strive to give you quality time with your provider. You may need to reschedule your appointment if you arrive late (15 or more minutes).  Arriving late affects you and other patients whose appointments are after yours.  Also, if you miss three or more appointments without notifying the office, you may be dismissed from the clinic at the provider's discretion.      For prescription refill requests, have your pharmacy contact our office and allow 72 hours for refills to be completed.    Today you received the following chemotherapy and/or immunotherapy agents :  Oxaliplatin, Leucovorin, 5Fu   To help prevent nausea and vomiting after your treatment, we encourage you to take your nausea medication as directed.  BELOW ARE SYMPTOMS THAT SHOULD BE REPORTED IMMEDIATELY: . *FEVER GREATER THAN 100.4 F (38 C) OR HIGHER . *CHILLS OR SWEATING . *NAUSEA AND VOMITING THAT IS NOT CONTROLLED WITH YOUR NAUSEA MEDICATION . *UNUSUAL SHORTNESS OF BREATH . *UNUSUAL BRUISING OR BLEEDING . *URINARY PROBLEMS (pain or burning when urinating, or frequent urination) . *BOWEL PROBLEMS (unusual diarrhea, constipation, pain near the anus) . TENDERNESS IN MOUTH AND THROAT WITH OR WITHOUT PRESENCE OF ULCERS (sore throat, sores in mouth, or a toothache) . UNUSUAL RASH, SWELLING OR PAIN  . UNUSUAL VAGINAL DISCHARGE OR ITCHING   Items with * indicate a potential emergency and should be followed up as soon as possible or go to the Emergency Department if any problems should occur.  Please show the CHEMOTHERAPY ALERT CARD or  IMMUNOTHERAPY ALERT CARD at check-in to the Emergency Department and triage nurse.  Should you have questions after your visit or need to cancel or reschedule your appointment, please contact [facility]  Dept: [phone]  and follow the prompts.  Office hours are 8:00 a.m. to 4:30 p.m. Monday - Friday. Please note that voicemails left after 4:00 p.m. may not be returned until the following business day.  We are closed weekends and major holidays. You have access to a nurse at all times for urgent questions. Please call the main number to the clinic Dept: [phone] and follow the prompts.   For any non-urgent questions, you may also contact your provider using MyChart. We now offer e-Visits for anyone 29 and older to request care online for non-urgent symptoms. For details visit [email or web address].   Also download the MyChart app! Go to the app store, search "MyChart", open the app, select [facility], and log in with your MyChart username and password.  Due to Covid, a mask is required upon entering the hospital/clinic. If you do not have a mask, one will be given to you upon arrival. For doctor visits, patients may have 1 support person aged 27 or older with them. For treatment visits, patients cannot have anyone with them due to current Covid guidelines and our immunocompromised population.    Oxaliplatin Injection What is this medicine? OXALIPLATIN (ox AL i PLA tin) is a chemotherapy drug. It targets fast dividing cells, like cancer cells, and causes these cells to die. This medicine is used to treat cancers  of the colon and rectum, and many other cancers. This medicine may be used for other purposes; ask your health care provider or pharmacist if you have questions. COMMON BRAND NAME(S): Eloxatin What should I tell my health care provider before I take this medicine? They need to know if you have any of these conditions:  heart disease  history of  irregular heartbeat  liver disease  low blood counts, like white cells, platelets, or red blood cells  lung or breathing disease, like asthma  take medicines that treat or prevent blood clots  tingling of the fingers or toes, or other nerve disorder  an unusual or allergic reaction to oxaliplatin, other chemotherapy, other medicines, foods, dyes, or preservatives  pregnant or trying to get pregnant  breast-feeding How should I use this medicine? This drug is given as an infusion into a vein. It is administered in a hospital or clinic by a specially trained health care professional. Talk to your pediatrician regarding the use of this medicine in children. Special care may be needed. Overdosage: If you think you have taken too much of this medicine contact a poison control center or emergency room at once. NOTE: This medicine is only for you. Do not share this medicine with others. What if I miss a dose? It is important not to miss a dose. Call your doctor or health care professional if you are unable to keep an appointment. What may interact with this medicine? Do not take this medicine with any of the following medications:  cisapride  dronedarone  pimozide  thioridazine This medicine may also interact with the following medications:  aspirin and aspirin-like medicines  certain medicines that treat or prevent blood clots like warfarin, apixaban, dabigatran, and rivaroxaban  cisplatin  cyclosporine  diuretics  medicines for infection like acyclovir, adefovir, amphotericin B, bacitracin, cidofovir, foscarnet, ganciclovir, gentamicin, pentamidine, vancomycin  NSAIDs, medicines for pain and inflammation, like ibuprofen or naproxen  other medicines that prolong the QT interval (an abnormal heart rhythm)  pamidronate  zoledronic acid This list may not describe all possible interactions. Give your health care provider a list of all the medicines, herbs,  non-prescription drugs, or dietary supplements you use. Also tell them if you smoke, drink alcohol, or use illegal drugs. Some items may interact with your medicine. What should I watch for while using this medicine? Your condition will be monitored carefully while you are receiving this medicine. You may need blood work done while you are taking this medicine. This medicine may make you feel generally unwell. This is not uncommon as chemotherapy can affect healthy cells as well as cancer cells. Report any side effects. Continue your course of treatment even though you feel ill unless your healthcare professional tells you to stop. This medicine can make you more sensitive to cold. Do not drink cold drinks or use ice. Cover exposed skin before coming in contact with cold temperatures or cold objects. When out in cold weather wear warm clothing and cover your mouth and nose to warm the air that goes into your lungs. Tell your doctor if you get sensitive to the cold. Do not become pregnant while taking this medicine or for 9 months after stopping it. Women should inform their health care professional if they wish to become pregnant or think they might be pregnant. Men should not father a child while taking this medicine and for 6 months after stopping it. There is potential for serious side effects to an unborn child. Talk  to your health care professional for more information. Do not breast-feed a child while taking this medicine or for 3 months after stopping it. This medicine has caused ovarian failure in some women. This medicine may make it more difficult to get pregnant. Talk to your health care professional if you are concerned about your fertility. This medicine has caused decreased sperm counts in some men. This may make it more difficult to father a child. Talk to your health care professional if you are concerned about your fertility. This medicine may increase your risk of getting an infection.  Call your health care professional for advice if you get a fever, chills, or sore throat, or other symptoms of a cold or flu. Do not treat yourself. Try to avoid being around people who are sick. Avoid taking medicines that contain aspirin, acetaminophen, ibuprofen, naproxen, or ketoprofen unless instructed by your health care professional. These medicines may hide a fever. Be careful brushing or flossing your teeth or using a toothpick because you may get an infection or bleed more easily. If you have any dental work done, tell your dentist you are receiving this medicine. What side effects may I notice from receiving this medicine? Side effects that you should report to your doctor or health care professional as soon as possible:  allergic reactions like skin rash, itching or hives, swelling of the face, lips, or tongue  breathing problems  cough  low blood counts - this medicine may decrease the number of white blood cells, red blood cells, and platelets. You may be at increased risk for infections and bleeding  nausea, vomiting  pain, redness, or irritation at site where injected  pain, tingling, numbness in the hands or feet  signs and symptoms of bleeding such as bloody or black, tarry stools; red or dark brown urine; spitting up blood or brown material that looks like coffee grounds; red spots on the skin; unusual bruising or bleeding from the eyes, gums, or nose  signs and symptoms of a dangerous change in heartbeat or heart rhythm like chest pain; dizziness; fast, irregular heartbeat; palpitations; feeling faint or lightheaded; falls  signs and symptoms of infection like fever; chills; cough; sore throat; pain or trouble passing urine  signs and symptoms of liver injury like dark yellow or brown urine; general ill feeling or flu-like symptoms; light-colored stools; loss of appetite; nausea; right upper belly pain; unusually weak or tired; yellowing of the eyes or skin  signs and  symptoms of low red blood cells or anemia such as unusually weak or tired; feeling faint or lightheaded; falls  signs and symptoms of muscle injury like dark urine; trouble passing urine or change in the amount of urine; unusually weak or tired; muscle pain; back pain Side effects that usually do not require medical attention (report to your doctor or health care professional if they continue or are bothersome):  changes in taste  diarrhea  gas  hair loss  loss of appetite  mouth sores This list may not describe all possible side effects. Call your doctor for medical advice about side effects. You may report side effects to FDA at 1-800-FDA-1088. Where should I keep my medicine? This drug is given in a hospital or clinic and will not be stored at home. NOTE: This sheet is a summary. It may not cover all possible information. If you have questions about this medicine, talk to your doctor, pharmacist, or health care provider.  2021 Elsevier/Gold Standard (2019-02-22 12:20:35)  Leucovorin injection What is this medicine? LEUCOVORIN (loo koe VOR in) is used to prevent or treat the harmful effects of some medicines. This medicine is used to treat anemia caused by a low amount of folic acid in the body. It is also used with 5-fluorouracil (5-FU) to treat colon cancer. This medicine may be used for other purposes; ask your health care provider or pharmacist if you have questions. What should I tell my health care provider before I take this medicine? They need to know if you have any of these conditions:  anemia from low levels of vitamin B-12 in the blood  an unusual or allergic reaction to leucovorin, folic acid, other medicines, foods, dyes, or preservatives  pregnant or trying to get pregnant  breast-feeding How should I use this medicine? This medicine is for injection into a muscle or into a vein. It is given by a health care professional in a hospital or clinic setting. Talk  to your pediatrician regarding the use of this medicine in children. Special care may be needed. Overdosage: If you think you have taken too much of this medicine contact a poison control center or emergency room at once. NOTE: This medicine is only for you. Do not share this medicine with others. What if I miss a dose? This does not apply. What may interact with this medicine?  capecitabine  fluorouracil  phenobarbital  phenytoin  primidone  trimethoprim-sulfamethoxazole This list may not describe all possible interactions. Give your health care provider a list of all the medicines, herbs, non-prescription drugs, or dietary supplements you use. Also tell them if you smoke, drink alcohol, or use illegal drugs. Some items may interact with your medicine. What should I watch for while using this medicine? Your condition will be monitored carefully while you are receiving this medicine. This medicine may increase the side effects of 5-fluorouracil, 5-FU. Tell your doctor or health care professional if you have diarrhea or mouth sores that do not get better or that get worse. What side effects may I notice from receiving this medicine? Side effects that you should report to your doctor or health care professional as soon as possible:  allergic reactions like skin rash, itching or hives, swelling of the face, lips, or tongue  breathing problems  fever, infection  mouth sores  unusual bleeding or bruising  unusually weak or tired Side effects that usually do not require medical attention (report to your doctor or health care professional if they continue or are bothersome):  constipation or diarrhea  loss of appetite  nausea, vomiting This list may not describe all possible side effects. Call your doctor for medical advice about side effects. You may report side effects to FDA at 1-800-FDA-1088. Where should I keep my medicine? This drug is given in a hospital or clinic and  will not be stored at home. NOTE: This sheet is a summary. It may not cover all possible information. If you have questions about this medicine, talk to your doctor, pharmacist, or health care provider.  2021 Elsevier/Gold Standard (2008-04-10 16:50:29)  Fluorouracil, 5-FU injection What is this medicine? FLUOROURACIL, 5-FU (flure oh YOOR a sil) is a chemotherapy drug. It slows the growth of cancer cells. This medicine is used to treat many types of cancer like breast cancer, colon or rectal cancer, pancreatic cancer, and stomach cancer. This medicine may be used for other purposes; ask your health care provider or pharmacist if you have questions. COMMON BRAND NAME(S): Adrucil What should I  tell my health care provider before I take this medicine? They need to know if you have any of these conditions:  blood disorders  dihydropyrimidine dehydrogenase (DPD) deficiency  infection (especially a virus infection such as chickenpox, cold sores, or herpes)  kidney disease  liver disease  malnourished, poor nutrition  recent or ongoing radiation therapy  an unusual or allergic reaction to fluorouracil, other chemotherapy, other medicines, foods, dyes, or preservatives  pregnant or trying to get pregnant  breast-feeding How should I use this medicine? This drug is given as an infusion or injection into a vein. It is administered in a hospital or clinic by a specially trained health care professional. Talk to your pediatrician regarding the use of this medicine in children. Special care may be needed. Overdosage: If you think you have taken too much of this medicine contact a poison control center or emergency room at once. NOTE: This medicine is only for you. Do not share this medicine with others. What if I miss a dose? It is important not to miss your dose. Call your doctor or health care professional if you are unable to keep an appointment. What may interact with this medicine? Do  not take this medicine with any of the following medications:  live virus vaccines This medicine may also interact with the following medications:  medicines that treat or prevent blood clots like warfarin, enoxaparin, and dalteparin This list may not describe all possible interactions. Give your health care provider a list of all the medicines, herbs, non-prescription drugs, or dietary supplements you use. Also tell them if you smoke, drink alcohol, or use illegal drugs. Some items may interact with your medicine. What should I watch for while using this medicine? Visit your doctor for checks on your progress. This drug may make you feel generally unwell. This is not uncommon, as chemotherapy can affect healthy cells as well as cancer cells. Report any side effects. Continue your course of treatment even though you feel ill unless your doctor tells you to stop. In some cases, you may be given additional medicines to help with side effects. Follow all directions for their use. Call your doctor or health care professional for advice if you get a fever, chills or sore throat, or other symptoms of a cold or flu. Do not treat yourself. This drug decreases your body's ability to fight infections. Try to avoid being around people who are sick. This medicine may increase your risk to bruise or bleed. Call your doctor or health care professional if you notice any unusual bleeding. Be careful brushing and flossing your teeth or using a toothpick because you may get an infection or bleed more easily. If you have any dental work done, tell your dentist you are receiving this medicine. Avoid taking products that contain aspirin, acetaminophen, ibuprofen, naproxen, or ketoprofen unless instructed by your doctor. These medicines may hide a fever. Do not become pregnant while taking this medicine. Women should inform their doctor if they wish to become pregnant or think they might be pregnant. There is a potential  for serious side effects to an unborn child. Talk to your health care professional or pharmacist for more information. Do not breast-feed an infant while taking this medicine. Men should inform their doctor if they wish to father a child. This medicine may lower sperm counts. Do not treat diarrhea with over the counter products. Contact your doctor if you have diarrhea that lasts more than 2 days or if it is  severe and watery. This medicine can make you more sensitive to the sun. Keep out of the sun. If you cannot avoid being in the sun, wear protective clothing and use sunscreen. Do not use sun lamps or tanning beds/booths. What side effects may I notice from receiving this medicine? Side effects that you should report to your doctor or health care professional as soon as possible:  allergic reactions like skin rash, itching or hives, swelling of the face, lips, or tongue  low blood counts - this medicine may decrease the number of white blood cells, red blood cells and platelets. You may be at increased risk for infections and bleeding.  signs of infection - fever or chills, cough, sore throat, pain or difficulty passing urine  signs of decreased platelets or bleeding - bruising, pinpoint red spots on the skin, black, tarry stools, blood in the urine  signs of decreased red blood cells - unusually weak or tired, fainting spells, lightheadedness  breathing problems  changes in vision  chest pain  mouth sores  nausea and vomiting  pain, swelling, redness at site where injected  pain, tingling, numbness in the hands or feet  redness, swelling, or sores on hands or feet  stomach pain  unusual bleeding Side effects that usually do not require medical attention (report to your doctor or health care professional if they continue or are bothersome):  changes in finger or toe nails  diarrhea  dry or itchy skin  hair loss  headache  loss of appetite  sensitivity of eyes to  the light  stomach upset  unusually teary eyes This list may not describe all possible side effects. Call your doctor for medical advice about side effects. You may report side effects to FDA at 1-800-FDA-1088. Where should I keep my medicine? This drug is given in a hospital or clinic and will not be stored at home. NOTE: This sheet is a summary. It may not cover all possible information. If you have questions about this medicine, talk to your doctor, pharmacist, or health care provider.  2021 Elsevier/Gold Standard (2019-09-05 15:00:03)

## 2021-03-05 NOTE — Progress Notes (Signed)
Patient will receive 1 complementary case of Ensure Plus on Friday, May 20.

## 2021-03-05 NOTE — Patient Instructions (Signed)
[facility]  Discharge Instructions: Thank you for choosing [facility] to provide your oncology and hematology care.   If you have a lab appointment with the Cancer Center, please go directly to the Cancer Center and check in at the registration area.   Wear comfortable clothing and clothing appropriate for easy access to any Portacath or PICC line.   We strive to give you quality time with your provider. You may need to reschedule your appointment if you arrive late (15 or more minutes).  Arriving late affects you and other patients whose appointments are after yours.  Also, if you miss three or more appointments without notifying the office, you may be dismissed from the clinic at the provider's discretion.      For prescription refill requests, have your pharmacy contact our office and allow 72 hours for refills to be completed.    Today you received the following chemotherapy and/or immunotherapy agents Oxaliplatin, Leucovorin, 5FU      To help prevent nausea and vomiting after your treatment, we encourage you to take your nausea medication as directed.  BELOW ARE SYMPTOMS THAT SHOULD BE REPORTED IMMEDIATELY: *FEVER GREATER THAN 100.4 F (38 C) OR HIGHER *CHILLS OR SWEATING *NAUSEA AND VOMITING THAT IS NOT CONTROLLED WITH YOUR NAUSEA MEDICATION *UNUSUAL SHORTNESS OF BREATH *UNUSUAL BRUISING OR BLEEDING *URINARY PROBLEMS (pain or burning when urinating, or frequent urination) *BOWEL PROBLEMS (unusual diarrhea, constipation, pain near the anus) TENDERNESS IN MOUTH AND THROAT WITH OR WITHOUT PRESENCE OF ULCERS (sore throat, sores in mouth, or a toothache) UNUSUAL RASH, SWELLING OR PAIN  UNUSUAL VAGINAL DISCHARGE OR ITCHING   Items with * indicate a potential emergency and should be followed up as soon as possible or go to the Emergency Department if any problems should occur.  Please show the CHEMOTHERAPY ALERT CARD or IMMUNOTHERAPY ALERT CARD  at check-in to the Emergency Department and triage nurse.  Should you have questions after your visit or need to cancel or reschedule your appointment, please contact [facility]  Dept: [phone]  and follow the prompts.  Office hours are 8:00 a.m. to 4:30 p.m. Monday - Friday. Please note that voicemails left after 4:00 p.m. may not be returned until the following business day.  We are closed weekends and major holidays. You have access to a nurse at all times for urgent questions. Please call the main number to the clinic Dept: [phone] and follow the prompts.   For any non-urgent questions, you may also contact your provider using MyChart. We now offer e-Visits for anyone 18 and older to request care online for non-urgent symptoms. For details visit [email or web address].   Also download the MyChart app! Go to the app store, search "MyChart", open the app, select , and log in with your MyChart username and password.  Due to Covid, a mask is required upon entering the hospital/clinic. If you do not have a mask, one will be given to you upon arrival. For doctor visits, patients may have 1 support person aged 18 or older with them. For treatment visits, patients cannot have anyone with them due to current Covid guidelines and our immunocompromised population.   

## 2021-03-06 ENCOUNTER — Inpatient Hospital Stay: Payer: Medicare HMO | Admitting: Oncology

## 2021-03-07 ENCOUNTER — Inpatient Hospital Stay: Payer: Medicare HMO

## 2021-03-07 ENCOUNTER — Ambulatory Visit: Payer: Medicare HMO

## 2021-03-07 ENCOUNTER — Other Ambulatory Visit: Payer: Self-pay

## 2021-03-07 VITALS — BP 121/81 | HR 90 | Temp 98.4°F | Resp 20

## 2021-03-07 DIAGNOSIS — C20 Malignant neoplasm of rectum: Secondary | ICD-10-CM

## 2021-03-07 DIAGNOSIS — C19 Malignant neoplasm of rectosigmoid junction: Secondary | ICD-10-CM | POA: Diagnosis not present

## 2021-03-07 DIAGNOSIS — G620 Drug-induced polyneuropathy: Secondary | ICD-10-CM | POA: Diagnosis not present

## 2021-03-07 DIAGNOSIS — T451X5A Adverse effect of antineoplastic and immunosuppressive drugs, initial encounter: Secondary | ICD-10-CM | POA: Diagnosis not present

## 2021-03-07 DIAGNOSIS — Z5111 Encounter for antineoplastic chemotherapy: Secondary | ICD-10-CM | POA: Diagnosis not present

## 2021-03-07 DIAGNOSIS — G893 Neoplasm related pain (acute) (chronic): Secondary | ICD-10-CM | POA: Diagnosis not present

## 2021-03-07 DIAGNOSIS — C7800 Secondary malignant neoplasm of unspecified lung: Secondary | ICD-10-CM | POA: Diagnosis not present

## 2021-03-07 MED ORDER — HEPARIN SOD (PORK) LOCK FLUSH 100 UNIT/ML IV SOLN
500.0000 [IU] | Freq: Once | INTRAVENOUS | Status: AC | PRN
  Administered 2021-03-07: 500 [IU]
  Filled 2021-03-07: qty 5

## 2021-03-07 MED ORDER — PEGFILGRASTIM-CBQV 6 MG/0.6ML ~~LOC~~ SOSY
6.0000 mg | PREFILLED_SYRINGE | Freq: Once | SUBCUTANEOUS | Status: AC
  Administered 2021-03-07: 6 mg via SUBCUTANEOUS
  Filled 2021-03-07: qty 0.6

## 2021-03-07 MED ORDER — SODIUM CHLORIDE 0.9% FLUSH
10.0000 mL | INTRAVENOUS | Status: DC | PRN
  Administered 2021-03-07: 10 mL
  Filled 2021-03-07: qty 10

## 2021-03-07 NOTE — Patient Instructions (Signed)
[facility]  Discharge Instructions: Thank you for choosing [facility] to provide your oncology and hematology care.   If you have a lab appointment with the [facility], please go directly to the [facility] and check in at the registration area.   Wear comfortable clothing and clothing appropriate for easy access to any Portacath or PICC line.   We strive to give you quality time with your provider. You may need to reschedule your appointment if you arrive late (15 or more minutes).  Arriving late affects you and other patients whose appointments are after yours.  Also, if you miss three or more appointments without notifying the office, you may be dismissed from the clinic at the provider's discretion.      For prescription refill requests, have your pharmacy contact our office and allow 72 hours for refills to be completed.    Today you received the following chemotherapy and/or immunotherapy agents: 5FU, udenyca   To help prevent nausea and vomiting after your treatment, we encourage you to take your nausea medication as directed.  BELOW ARE SYMPTOMS THAT SHOULD BE REPORTED IMMEDIATELY: . *FEVER GREATER THAN 100.4 F (38 C) OR HIGHER . *CHILLS OR SWEATING . *NAUSEA AND VOMITING THAT IS NOT CONTROLLED WITH YOUR NAUSEA MEDICATION . *UNUSUAL SHORTNESS OF BREATH . *UNUSUAL BRUISING OR BLEEDING . *URINARY PROBLEMS (pain or burning when urinating, or frequent urination) . *BOWEL PROBLEMS (unusual diarrhea, constipation, pain near the anus) . TENDERNESS IN MOUTH AND THROAT WITH OR WITHOUT PRESENCE OF ULCERS (sore throat, sores in mouth, or a toothache) . UNUSUAL RASH, SWELLING OR PAIN  . UNUSUAL VAGINAL DISCHARGE OR ITCHING   Items with * indicate a potential emergency and should be followed up as soon as possible or go to the Emergency Department if any problems should occur.  Please show the CHEMOTHERAPY ALERT CARD or IMMUNOTHERAPY ALERT  CARD at check-in to the Emergency Department and triage nurse.  Should you have questions after your visit or need to cancel or reschedule your appointment, please contact [facility]  Dept: [phone]  and follow the prompts.  Office hours are 8:00 a.m. to 4:30 p.m. Monday - Friday. Please note that voicemails left after 4:00 p.m. may not be returned until the following business day.  We are closed weekends and major holidays. You have access to a nurse at all times for urgent questions. Please call the main number to the clinic Dept: [phone] and follow the prompts.   For any non-urgent questions, you may also contact your provider using MyChart. We now offer e-Visits for anyone 17 and older to request care online for non-urgent symptoms. For details visit [email or web address].   Also download the MyChart app! Go to the app store, search "MyChart", open the app, select [facility], and log in with your MyChart username and password.  Due to Covid, a mask is required upon entering the hospital/clinic. If you do not have a mask, one will be given to you upon arrival. For doctor visits, patients may have 1 support person aged 42 or older with them. For treatment visits, patients cannot have anyone with them due to current Covid guidelines and our immunocompromised population.   Fluorouracil, 5-FU injection What is this medicine? FLUOROURACIL, 5-FU (flure oh YOOR a sil) is a chemotherapy drug. It slows the growth of cancer cells. This medicine is used to treat many types of cancer like breast cancer, colon or rectal  cancer, pancreatic cancer, and stomach cancer. This medicine may be used for other purposes; ask your health care provider or pharmacist if you have questions. COMMON BRAND NAME(S): Adrucil What should I tell my health care provider before I take this medicine? They need to know if you have any of these conditions:  blood disorders  dihydropyrimidine  dehydrogenase (DPD) deficiency  infection (especially a virus infection such as chickenpox, cold sores, or herpes)  kidney disease  liver disease  malnourished, poor nutrition  recent or ongoing radiation therapy  an unusual or allergic reaction to fluorouracil, other chemotherapy, other medicines, foods, dyes, or preservatives  pregnant or trying to get pregnant  breast-feeding How should I use this medicine? This drug is given as an infusion or injection into a vein. It is administered in a hospital or clinic by a specially trained health care professional. Talk to your pediatrician regarding the use of this medicine in children. Special care may be needed. Overdosage: If you think you have taken too much of this medicine contact a poison control center or emergency room at once. NOTE: This medicine is only for you. Do not share this medicine with others. What if I miss a dose? It is important not to miss your dose. Call your doctor or health care professional if you are unable to keep an appointment. What may interact with this medicine? Do not take this medicine with any of the following medications:  live virus vaccines This medicine may also interact with the following medications:  medicines that treat or prevent blood clots like warfarin, enoxaparin, and dalteparin This list may not describe all possible interactions. Give your health care provider a list of all the medicines, herbs, non-prescription drugs, or dietary supplements you use. Also tell them if you smoke, drink alcohol, or use illegal drugs. Some items may interact with your medicine. What should I watch for while using this medicine? Visit your doctor for checks on your progress. This drug may make you feel generally unwell. This is not uncommon, as chemotherapy can affect healthy cells as well as cancer cells. Report any side effects. Continue your course of treatment even though you feel ill unless your doctor  tells you to stop. In some cases, you may be given additional medicines to help with side effects. Follow all directions for their use. Call your doctor or health care professional for advice if you get a fever, chills or sore throat, or other symptoms of a cold or flu. Do not treat yourself. This drug decreases your body's ability to fight infections. Try to avoid being around people who are sick. This medicine may increase your risk to bruise or bleed. Call your doctor or health care professional if you notice any unusual bleeding. Be careful brushing and flossing your teeth or using a toothpick because you may get an infection or bleed more easily. If you have any dental work done, tell your dentist you are receiving this medicine. Avoid taking products that contain aspirin, acetaminophen, ibuprofen, naproxen, or ketoprofen unless instructed by your doctor. These medicines may hide a fever. Do not become pregnant while taking this medicine. Women should inform their doctor if they wish to become pregnant or think they might be pregnant. There is a potential for serious side effects to an unborn child. Talk to your health care professional or pharmacist for more information. Do not breast-feed an infant while taking this medicine. Men should inform their doctor if they wish to father a child.  This medicine may lower sperm counts. Do not treat diarrhea with over the counter products. Contact your doctor if you have diarrhea that lasts more than 2 days or if it is severe and watery. This medicine can make you more sensitive to the sun. Keep out of the sun. If you cannot avoid being in the sun, wear protective clothing and use sunscreen. Do not use sun lamps or tanning beds/booths. What side effects may I notice from receiving this medicine? Side effects that you should report to your doctor or health care professional as soon as possible:  allergic reactions like skin rash, itching or hives, swelling of  the face, lips, or tongue  low blood counts - this medicine may decrease the number of white blood cells, red blood cells and platelets. You may be at increased risk for infections and bleeding.  signs of infection - fever or chills, cough, sore throat, pain or difficulty passing urine  signs of decreased platelets or bleeding - bruising, pinpoint red spots on the skin, black, tarry stools, blood in the urine  signs of decreased red blood cells - unusually weak or tired, fainting spells, lightheadedness  breathing problems  changes in vision  chest pain  mouth sores  nausea and vomiting  pain, swelling, redness at site where injected  pain, tingling, numbness in the hands or feet  redness, swelling, or sores on hands or feet  stomach pain  unusual bleeding Side effects that usually do not require medical attention (report to your doctor or health care professional if they continue or are bothersome):  changes in finger or toe nails  diarrhea  dry or itchy skin  hair loss  headache  loss of appetite  sensitivity of eyes to the light  stomach upset  unusually teary eyes This list may not describe all possible side effects. Call your doctor for medical advice about side effects. You may report side effects to FDA at 1-800-FDA-1088. Where should I keep my medicine? This drug is given in a hospital or clinic and will not be stored at home. NOTE: This sheet is a summary. It may not cover all possible information. If you have questions about this medicine, talk to your doctor, pharmacist, or health care provider.  2021 Elsevier/Gold Standard (2019-09-05 15:00:03)  Pegfilgrastim injection What is this medicine? PEGFILGRASTIM (PEG fil gra stim) is a long-acting granulocyte colony-stimulating factor that stimulates the growth of neutrophils, a type of white blood cell important in the body's fight against infection. It is used to reduce the incidence of fever and  infection in patients with certain types of cancer who are receiving chemotherapy that affects the bone marrow, and to increase survival after being exposed to high doses of radiation. This medicine may be used for other purposes; ask your health care provider or pharmacist if you have questions. COMMON BRAND NAME(S): Rexene Edison, Ziextenzo What should I tell my health care provider before I take this medicine? They need to know if you have any of these conditions:  kidney disease  latex allergy  ongoing radiation therapy  sickle cell disease  skin reactions to acrylic adhesives (On-Body Injector only)  an unusual or allergic reaction to pegfilgrastim, filgrastim, other medicines, foods, dyes, or preservatives  pregnant or trying to get pregnant  breast-feeding How should I use this medicine? This medicine is for injection under the skin. If you get this medicine at home, you will be taught how to prepare and give the pre-filled syringe  or how to use the On-body Injector. Refer to the patient Instructions for Use for detailed instructions. Use exactly as directed. Tell your healthcare provider immediately if you suspect that the On-body Injector may not have performed as intended or if you suspect the use of the On-body Injector resulted in a missed or partial dose. It is important that you put your used needles and syringes in a special sharps container. Do not put them in a trash can. If you do not have a sharps container, call your pharmacist or healthcare provider to get one. Talk to your pediatrician regarding the use of this medicine in children. While this drug may be prescribed for selected conditions, precautions do apply. Overdosage: If you think you have taken too much of this medicine contact a poison control center or emergency room at once. NOTE: This medicine is only for you. Do not share this medicine with others. What if I miss a dose? It is  important not to miss your dose. Call your doctor or health care professional if you miss your dose. If you miss a dose due to an On-body Injector failure or leakage, a new dose should be administered as soon as possible using a single prefilled syringe for manual use. What may interact with this medicine? Interactions have not been studied. This list may not describe all possible interactions. Give your health care provider a list of all the medicines, herbs, non-prescription drugs, or dietary supplements you use. Also tell them if you smoke, drink alcohol, or use illegal drugs. Some items may interact with your medicine. What should I watch for while using this medicine? Your condition will be monitored carefully while you are receiving this medicine. You may need blood work done while you are taking this medicine. Talk to your health care provider about your risk of cancer. You may be more at risk for certain types of cancer if you take this medicine. If you are going to need a MRI, CT scan, or other procedure, tell your doctor that you are using this medicine (On-Body Injector only). What side effects may I notice from receiving this medicine? Side effects that you should report to your doctor or health care professional as soon as possible:  allergic reactions (skin rash, itching or hives, swelling of the face, lips, or tongue)  back pain  dizziness  fever  pain, redness, or irritation at site where injected  pinpoint red spots on the skin  red or dark-brown urine  shortness of breath or breathing problems  stomach or side pain, or pain at the shoulder  swelling  tiredness  trouble passing urine or change in the amount of urine  unusual bruising or bleeding Side effects that usually do not require medical attention (report to your doctor or health care professional if they continue or are bothersome):  bone pain  muscle pain This list may not describe all possible side  effects. Call your doctor for medical advice about side effects. You may report side effects to FDA at 1-800-FDA-1088. Where should I keep my medicine? Keep out of the reach of children. If you are using this medicine at home, you will be instructed on how to store it. Throw away any unused medicine after the expiration date on the label. NOTE: This sheet is a summary. It may not cover all possible information. If you have questions about this medicine, talk to your doctor, pharmacist, or health care provider.  2021 Elsevier/Gold Standard (2019-10-27 13:20:51)

## 2021-03-11 ENCOUNTER — Encounter: Payer: Self-pay | Admitting: Oncology

## 2021-03-13 DIAGNOSIS — Z20822 Contact with and (suspected) exposure to covid-19: Secondary | ICD-10-CM | POA: Diagnosis not present

## 2021-03-16 ENCOUNTER — Other Ambulatory Visit: Payer: Self-pay | Admitting: Oncology

## 2021-03-18 ENCOUNTER — Encounter: Payer: Self-pay | Admitting: *Deleted

## 2021-03-18 DIAGNOSIS — Z20822 Contact with and (suspected) exposure to covid-19: Secondary | ICD-10-CM | POA: Diagnosis not present

## 2021-03-18 NOTE — Progress Notes (Signed)
[facility] [employer]  Holiday representative contacted patient at home after request for meals on wheels update.  CSW called Development worker, community and confirmed patient was still on the meals on wheels wait list.  [employer] stated the [facility] wait list is a 4-6 month waiting period, and patient will be contacted once space was available.  CSW left patient a voicemail with above information, and encouraged him to request a food bag from the [facility] food pantry at his next visit.    Johnnye Lana, MSW, LCSW, OSW-C Clinical Social Worker [facility] [phone]

## 2021-03-19 ENCOUNTER — Inpatient Hospital Stay: Payer: Medicare HMO

## 2021-03-19 ENCOUNTER — Encounter: Payer: Self-pay | Admitting: Nurse Practitioner

## 2021-03-19 ENCOUNTER — Inpatient Hospital Stay (HOSPITAL_BASED_OUTPATIENT_CLINIC_OR_DEPARTMENT_OTHER): Payer: Medicare HMO | Admitting: Nurse Practitioner

## 2021-03-19 ENCOUNTER — Inpatient Hospital Stay: Payer: Medicare HMO | Attending: Oncology

## 2021-03-19 ENCOUNTER — Encounter: Payer: Self-pay | Admitting: Dietician

## 2021-03-19 ENCOUNTER — Other Ambulatory Visit: Payer: Self-pay

## 2021-03-19 VITALS — BP 136/76 | HR 91 | Temp 97.7°F | Resp 18 | Wt 138.0 lb

## 2021-03-19 DIAGNOSIS — G620 Drug-induced polyneuropathy: Secondary | ICD-10-CM | POA: Diagnosis not present

## 2021-03-19 DIAGNOSIS — Z79899 Other long term (current) drug therapy: Secondary | ICD-10-CM | POA: Diagnosis not present

## 2021-03-19 DIAGNOSIS — Z5189 Encounter for other specified aftercare: Secondary | ICD-10-CM | POA: Diagnosis not present

## 2021-03-19 DIAGNOSIS — Z5111 Encounter for antineoplastic chemotherapy: Secondary | ICD-10-CM | POA: Diagnosis not present

## 2021-03-19 DIAGNOSIS — C20 Malignant neoplasm of rectum: Secondary | ICD-10-CM | POA: Insufficient documentation

## 2021-03-19 LAB — CBC WITH DIFFERENTIAL (CANCER CENTER ONLY)
Abs Immature Granulocytes: 0.47 10*3/uL — ABNORMAL HIGH (ref 0.00–0.07)
Basophils Absolute: 0.1 10*3/uL (ref 0.0–0.1)
Basophils Relative: 0 %
Eosinophils Absolute: 0.2 10*3/uL (ref 0.0–0.5)
Eosinophils Relative: 2 %
HCT: 34.6 % — ABNORMAL LOW (ref 39.0–52.0)
Hemoglobin: 11 g/dL — ABNORMAL LOW (ref 13.0–17.0)
Immature Granulocytes: 3 %
Lymphocytes Relative: 23 %
Lymphs Abs: 3.4 10*3/uL (ref 0.7–4.0)
MCH: 30.5 pg (ref 26.0–34.0)
MCHC: 31.8 g/dL (ref 30.0–36.0)
MCV: 95.8 fL (ref 80.0–100.0)
Monocytes Absolute: 0.9 10*3/uL (ref 0.1–1.0)
Monocytes Relative: 6 %
Neutro Abs: 9.7 10*3/uL — ABNORMAL HIGH (ref 1.7–7.7)
Neutrophils Relative %: 66 %
Platelet Count: 221 10*3/uL (ref 150–400)
RBC: 3.61 MIL/uL — ABNORMAL LOW (ref 4.22–5.81)
RDW: 15.8 % — ABNORMAL HIGH (ref 11.5–15.5)
WBC Count: 14.7 10*3/uL — ABNORMAL HIGH (ref 4.0–10.5)
nRBC: 0 % (ref 0.0–0.2)

## 2021-03-19 LAB — CMP (CANCER CENTER ONLY)
ALT: 6 U/L (ref 0–44)
AST: 13 U/L — ABNORMAL LOW (ref 15–41)
Albumin: 3.8 g/dL (ref 3.5–5.0)
Alkaline Phosphatase: 95 U/L (ref 38–126)
Anion gap: 11 (ref 5–15)
BUN: 7 mg/dL — ABNORMAL LOW (ref 8–23)
CO2: 24 mmol/L (ref 22–32)
Calcium: 8.6 mg/dL — ABNORMAL LOW (ref 8.9–10.3)
Chloride: 105 mmol/L (ref 98–111)
Creatinine: 0.89 mg/dL (ref 0.61–1.24)
GFR, Estimated: >60 mL/min (ref >60)
Glucose, Bld: 79 mg/dL (ref 70–99)
Potassium: 3.6 mmol/L (ref 3.5–5.1)
Sodium: 140 mmol/L (ref 135–145)
Total Bilirubin: 0.2 mg/dL — ABNORMAL LOW (ref 0.3–1.2)
Total Protein: 6.8 g/dL (ref 6.5–8.1)

## 2021-03-19 MED ORDER — LEUCOVORIN CALCIUM INJECTION 350 MG
400.0000 mg/m2 | Freq: Once | INTRAVENOUS | Status: AC
  Administered 2021-03-19: 684 mg via INTRAVENOUS
  Filled 2021-03-19: qty 34.2

## 2021-03-19 MED ORDER — SODIUM CHLORIDE 0.9 % IV SOLN
2400.0000 mg/m2 | INTRAVENOUS | Status: DC
  Administered 2021-03-19: 4100 mg via INTRAVENOUS
  Filled 2021-03-19: qty 82

## 2021-03-19 MED ORDER — PALONOSETRON HCL INJECTION 0.25 MG/5ML
0.2500 mg | Freq: Once | INTRAVENOUS | Status: AC
  Administered 2021-03-19: 0.25 mg via INTRAVENOUS
  Filled 2021-03-19: qty 5

## 2021-03-19 MED ORDER — FLUOROURACIL CHEMO INJECTION 2.5 GM/50ML
400.0000 mg/m2 | Freq: Once | INTRAVENOUS | Status: AC
Start: 2021-03-19 — End: 2021-03-19
  Administered 2021-03-19: 700 mg via INTRAVENOUS
  Filled 2021-03-19: qty 14

## 2021-03-19 MED ORDER — SODIUM CHLORIDE 0.9 % IV SOLN
10.0000 mg | Freq: Once | INTRAVENOUS | Status: AC
  Administered 2021-03-19: 10 mg via INTRAVENOUS
  Filled 2021-03-19: qty 1

## 2021-03-19 MED ORDER — SODIUM CHLORIDE 0.9% FLUSH
10.0000 mL | INTRAVENOUS | Status: DC | PRN
  Administered 2021-03-19: 10 mL
  Filled 2021-03-19: qty 10

## 2021-03-19 MED ORDER — DEXTROSE 5 % IV SOLN
Freq: Once | INTRAVENOUS | Status: AC
  Filled 2021-03-19: qty 250

## 2021-03-19 MED ORDER — OXALIPLATIN CHEMO INJECTION 100 MG/20ML
85.0000 mg/m2 | Freq: Once | INTRAVENOUS | Status: AC
  Administered 2021-03-19: 145 mg via INTRAVENOUS
  Filled 2021-03-19: qty 29

## 2021-03-19 NOTE — Progress Notes (Signed)
  [facility] OFFICE PROGRESS NOTE   Diagnosis:  Rectal cancer  INTERVAL HISTORY:   Phillip Brooks returns as scheduled. He completed cycle 5 FOLFOX 03/05/2021.  He denies nausea/vomiting.  He may have had a few small mouth sores.  He was able to eat and drink without difficulty.  No diarrhea.  He is having regular bowel movements.  He is no longer experiencing pain or bleeding with bowel movements.  Cold sensitivity lasted 3 to 4 days.  No persistent neuropathy symptoms.  Objective:  Vital signs in last 24 hours:  Blood pressure 136/76, pulse 91, temperature 97.7 F (36.5 C), temperature source Temporal, resp. rate 18, weight 138 lb (62.6 kg), SpO2 99 %.    HEENT: No thrush or ulcers. Resp: Lungs clear bilaterally. Cardio: Regular rate and rhythm. GI: Abdomen soft and nontender.  No hepatomegaly. Vascular: No leg edema. Neuro: Vibratory sense mildly decreased over the fingertips per tuning fork exam. Skin: Palms with scattered areas of hyperpigmentation, dry appearance. Port-A-Cath without erythema.   Lab Results:  Lab Results  Component Value Date   WBC 14.7 (H) 03/19/2021   HGB 11.0 (L) 03/19/2021   HCT 34.6 (L) 03/19/2021   MCV 95.8 03/19/2021   PLT 221 03/19/2021   NEUTROABS 9.7 (H) 03/19/2021    Imaging:  No results found.  Medications: I have reviewed the patient's current medications.  Assessment/Plan: 1. Rectal cancer ? Colonoscopy 12/06/2020-partially obstructing mass beginning at 7 cm from the anal verge-biopsy adenocarcinoma ? CTs 12/10/2020-large circumferential fungating mass at the rectosigmoid junction beginning at 9 cm from the anal verge, enlarged perirectal lymph nodes, prominent bilateral iliac and pelvic sidewall nodes-new compared to a CT from 2018, no evidence of metastatic disease to the chest, pulmonary nodules at the right apex-nonspecific ? MRI pelvis 12/24/2020-T4b, N2; distance from tumor to the internal anal sphincter 7.3 cm;  findings of potential contained perforation with tract seen potentially extending through the tumor into the mesorectum. ? Plan for total neoadjuvant therapy ? Cycle 1 FOLFOX 01/07/2021 ? Cycle 2 FOLFOX 01/22/2021 ? Cycle 3 FOLFOX 02/06/2021 ? Cycle 4 FOLFOX 02/19/2021 (oxaliplatin dose reduced to 65 mg per metered squared due to mild neutropenia) ? Cycle 5 FOLFOX 03/05/2021, Udenyca ? Cycle 6 FOLFOX 03/19/2021, Udenyca  2. Pain secondary #1-improved 3. Weight loss-improved 4. Port-A-Cath placement, Dr. 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   Disposition: Phillip Brooks appears well.  He has completed 5 cycles of neoadjuvant FOLFOX.  He continues to tolerate chemotherapy well.  Plan to proceed with cycle 6 today as scheduled.  We reviewed the CBC from today.  Counts adequate to proceed with treatment.  He will return for lab, follow-up, cycle 7 FOLFOX in 2 weeks.  He will contact the office in the interim with any problems.    Phillip Brooks ANP/GNP-BC   03/19/2021  8:32 AM

## 2021-03-19 NOTE — Patient Instructions (Signed)

## 2021-03-19 NOTE — Patient Instructions (Signed)
[facility]  Discharge Instructions: Thank you for choosing [facility] to provide your oncology and hematology care.   If you have a lab appointment with the [facility], please go directly to the [facility] and check in at the registration area.   Wear comfortable clothing and clothing appropriate for easy access to any Portacath or PICC line.   We strive to give you quality time with your provider. You may need to reschedule your appointment if you arrive late (15 or more minutes).  Arriving late affects you and other patients whose appointments are after yours.  Also, if you miss three or more appointments without notifying the office, you may be dismissed from the clinic at the provider's discretion.      For prescription refill requests, have your pharmacy contact our office and allow 72 hours for refills to be completed.    Today you received the following chemotherapy and/or immunotherapy agents Oxaliplatin, Leucovorin, 5FU      To help prevent nausea and vomiting after your treatment, we encourage you to take your nausea medication as directed.  BELOW ARE SYMPTOMS THAT SHOULD BE REPORTED IMMEDIATELY: . *FEVER GREATER THAN 100.4 F (38 C) OR HIGHER . *CHILLS OR SWEATING . *NAUSEA AND VOMITING THAT IS NOT CONTROLLED WITH YOUR NAUSEA MEDICATION . *UNUSUAL SHORTNESS OF BREATH . *UNUSUAL BRUISING OR BLEEDING . *URINARY PROBLEMS (pain or burning when urinating, or frequent urination) . *BOWEL PROBLEMS (unusual diarrhea, constipation, pain near the anus) . TENDERNESS IN MOUTH AND THROAT WITH OR WITHOUT PRESENCE OF ULCERS (sore throat, sores in mouth, or a toothache) . UNUSUAL RASH, SWELLING OR PAIN  . UNUSUAL VAGINAL DISCHARGE OR ITCHING   Items with * indicate a potential emergency and should be followed up as soon as possible or go to the Emergency Department if any problems should occur.  Please show the CHEMOTHERAPY ALERT CARD or  IMMUNOTHERAPY ALERT CARD at check-in to the Emergency Department and triage nurse.  Should you have questions after your visit or need to cancel or reschedule your appointment, please contact [facility]  Dept: [phone]  and follow the prompts.  Office hours are 8:00 a.m. to 4:30 p.m. Monday - Friday. Please note that voicemails left after 4:00 p.m. may not be returned until the following business day.  We are closed weekends and major holidays. You have access to a nurse at all times for urgent questions. Please call the main number to the clinic Dept: [phone] and follow the prompts.   For any non-urgent questions, you may also contact your provider using MyChart. We now offer e-Visits for anyone 41 and older to request care online for non-urgent symptoms. For details visit [email or web address].   Also download the MyChart app! Go to the app store, search "MyChart", open the app, select [facility], and log in with your MyChart username and password.  Due to Covid, a mask is required upon entering the hospital/clinic. If you do not have a mask, one will be given to you upon arrival. For doctor visits, patients may have 1 support person aged 50 or older with them. For treatment visits, patients cannot have anyone with them due to current Covid guidelines and our immunocompromised population.   Oxaliplatin Injection What is this medicine? OXALIPLATIN (ox AL i PLA tin) is a chemotherapy drug. It targets fast dividing cells, like cancer cells, and causes these cells to die. This medicine is used to treat cancers  of the colon and rectum, and many other cancers. This medicine may be used for other purposes; ask your health care provider or pharmacist if you have questions. COMMON BRAND NAME(S): Eloxatin What should I tell my health care provider before I take this medicine? They need to know if you have any of these conditions:  heart disease  history of  irregular heartbeat  liver disease  low blood counts, like white cells, platelets, or red blood cells  lung or breathing disease, like asthma  take medicines that treat or prevent blood clots  tingling of the fingers or toes, or other nerve disorder  an unusual or allergic reaction to oxaliplatin, other chemotherapy, other medicines, foods, dyes, or preservatives  pregnant or trying to get pregnant  breast-feeding How should I use this medicine? This drug is given as an infusion into a vein. It is administered in a hospital or clinic by a specially trained health care professional. Talk to your pediatrician regarding the use of this medicine in children. Special care may be needed. Overdosage: If you think you have taken too much of this medicine contact a poison control center or emergency room at once. NOTE: This medicine is only for you. Do not share this medicine with others. What if I miss a dose? It is important not to miss a dose. Call your doctor or health care professional if you are unable to keep an appointment. What may interact with this medicine? Do not take this medicine with any of the following medications:  cisapride  dronedarone  pimozide  thioridazine This medicine may also interact with the following medications:  aspirin and aspirin-like medicines  certain medicines that treat or prevent blood clots like warfarin, apixaban, dabigatran, and rivaroxaban  cisplatin  cyclosporine  diuretics  medicines for infection like acyclovir, adefovir, amphotericin B, bacitracin, cidofovir, foscarnet, ganciclovir, gentamicin, pentamidine, vancomycin  NSAIDs, medicines for pain and inflammation, like ibuprofen or naproxen  other medicines that prolong the QT interval (an abnormal heart rhythm)  pamidronate  zoledronic acid This list may not describe all possible interactions. Give your health care provider a list of all the medicines, herbs,  non-prescription drugs, or dietary supplements you use. Also tell them if you smoke, drink alcohol, or use illegal drugs. Some items may interact with your medicine. What should I watch for while using this medicine? Your condition will be monitored carefully while you are receiving this medicine. You may need blood work done while you are taking this medicine. This medicine may make you feel generally unwell. This is not uncommon as chemotherapy can affect healthy cells as well as cancer cells. Report any side effects. Continue your course of treatment even though you feel ill unless your healthcare professional tells you to stop. This medicine can make you more sensitive to cold. Do not drink cold drinks or use ice. Cover exposed skin before coming in contact with cold temperatures or cold objects. When out in cold weather wear warm clothing and cover your mouth and nose to warm the air that goes into your lungs. Tell your doctor if you get sensitive to the cold. Do not become pregnant while taking this medicine or for 9 months after stopping it. Women should inform their health care professional if they wish to become pregnant or think they might be pregnant. Men should not father a child while taking this medicine and for 6 months after stopping it. There is potential for serious side effects to an unborn child. Talk  to your health care professional for more information. Do not breast-feed a child while taking this medicine or for 3 months after stopping it. This medicine has caused ovarian failure in some women. This medicine may make it more difficult to get pregnant. Talk to your health care professional if you are concerned about your fertility. This medicine has caused decreased sperm counts in some men. This may make it more difficult to father a child. Talk to your health care professional if you are concerned about your fertility. This medicine may increase your risk of getting an infection.  Call your health care professional for advice if you get a fever, chills, or sore throat, or other symptoms of a cold or flu. Do not treat yourself. Try to avoid being around people who are sick. Avoid taking medicines that contain aspirin, acetaminophen, ibuprofen, naproxen, or ketoprofen unless instructed by your health care professional. These medicines may hide a fever. Be careful brushing or flossing your teeth or using a toothpick because you may get an infection or bleed more easily. If you have any dental work done, tell your dentist you are receiving this medicine. What side effects may I notice from receiving this medicine? Side effects that you should report to your doctor or health care professional as soon as possible:  allergic reactions like skin rash, itching or hives, swelling of the face, lips, or tongue  breathing problems  cough  low blood counts - this medicine may decrease the number of white blood cells, red blood cells, and platelets. You may be at increased risk for infections and bleeding  nausea, vomiting  pain, redness, or irritation at site where injected  pain, tingling, numbness in the hands or feet  signs and symptoms of bleeding such as bloody or black, tarry stools; red or dark brown urine; spitting up blood or brown material that looks like coffee grounds; red spots on the skin; unusual bruising or bleeding from the eyes, gums, or nose  signs and symptoms of a dangerous change in heartbeat or heart rhythm like chest pain; dizziness; fast, irregular heartbeat; palpitations; feeling faint or lightheaded; falls  signs and symptoms of infection like fever; chills; cough; sore throat; pain or trouble passing urine  signs and symptoms of liver injury like dark yellow or brown urine; general ill feeling or flu-like symptoms; light-colored stools; loss of appetite; nausea; right upper belly pain; unusually weak or tired; yellowing of the eyes or skin  signs and  symptoms of low red blood cells or anemia such as unusually weak or tired; feeling faint or lightheaded; falls  signs and symptoms of muscle injury like dark urine; trouble passing urine or change in the amount of urine; unusually weak or tired; muscle pain; back pain Side effects that usually do not require medical attention (report to your doctor or health care professional if they continue or are bothersome):  changes in taste  diarrhea  gas  hair loss  loss of appetite  mouth sores This list may not describe all possible side effects. Call your doctor for medical advice about side effects. You may report side effects to FDA at 1-800-FDA-1088. Where should I keep my medicine? This drug is given in a hospital or clinic and will not be stored at home. NOTE: This sheet is a summary. It may not cover all possible information. If you have questions about this medicine, talk to your doctor, pharmacist, or health care provider.  2021 Elsevier/Gold Standard (2019-02-22 12:20:35)  Leucovorin  injection What is this medicine? LEUCOVORIN (loo koe VOR in) is used to prevent or treat the harmful effects of some medicines. This medicine is used to treat anemia caused by a low amount of folic acid in the body. It is also used with 5-fluorouracil (5-FU) to treat colon cancer. This medicine may be used for other purposes; ask your health care provider or pharmacist if you have questions. What should I tell my health care provider before I take this medicine? They need to know if you have any of these conditions: anemia from low levels of vitamin B-12 in the blood an unusual or allergic reaction to leucovorin, folic acid, other medicines, foods, dyes, or preservatives pregnant or trying to get pregnant breast-feeding How should I use this medicine? This medicine is for injection into a muscle or into a vein. It is given by a health care professional in a hospital or clinic setting. Talk to your  pediatrician regarding the use of this medicine in children. Special care may be needed. Overdosage: If you think you have taken too much of this medicine contact a poison control center or emergency room at once. NOTE: This medicine is only for you. Do not share this medicine with others. What if I miss a dose? This does not apply. What may interact with this medicine? capecitabine fluorouracil phenobarbital phenytoin primidone trimethoprim-sulfamethoxazole This list may not describe all possible interactions. Give your health care provider a list of all the medicines, herbs, non-prescription drugs, or dietary supplements you use. Also tell them if you smoke, drink alcohol, or use illegal drugs. Some items may interact with your medicine. What should I watch for while using this medicine? Your condition will be monitored carefully while you are receiving this medicine. This medicine may increase the side effects of 5-fluorouracil, 5-FU. Tell your doctor or health care professional if you have diarrhea or mouth sores that do not get better or that get worse. What side effects may I notice from receiving this medicine? Side effects that you should report to your doctor or health care professional as soon as possible: allergic reactions like skin rash, itching or hives, swelling of the face, lips, or tongue breathing problems fever, infection mouth sores unusual bleeding or bruising unusually weak or tired Side effects that usually do not require medical attention (report to your doctor or health care professional if they continue or are bothersome): constipation or diarrhea loss of appetite nausea, vomiting This list may not describe all possible side effects. Call your doctor for medical advice about side effects. You may report side effects to FDA at 1-800-FDA-1088. Where should I keep my medicine? This drug is given in a hospital or clinic and will not be stored at home. NOTE: This  sheet is a summary. It may not cover all possible information. If you have questions about this medicine, talk to your doctor, pharmacist, or health care provider.  2021 Elsevier/Gold Standard (2008-04-10 16:50:29)  Fluorouracil, 5-FU injection What is this medicine? FLUOROURACIL, 5-FU (flure oh YOOR a sil) is a chemotherapy drug. It slows the growth of cancer cells. This medicine is used to treat many types of cancer like breast cancer, colon or rectal cancer, pancreatic cancer, and stomach cancer. This medicine may be used for other purposes; ask your health care provider or pharmacist if you have questions. COMMON BRAND NAME(S): Adrucil What should I tell my health care provider before I take this medicine? They need to know if you have any of these  conditions:  blood disorders  dihydropyrimidine dehydrogenase (DPD) deficiency  infection (especially a virus infection such as chickenpox, cold sores, or herpes)  kidney disease  liver disease  malnourished, poor nutrition  recent or ongoing radiation therapy  an unusual or allergic reaction to fluorouracil, other chemotherapy, other medicines, foods, dyes, or preservatives  pregnant or trying to get pregnant  breast-feeding How should I use this medicine? This drug is given as an infusion or injection into a vein. It is administered in a hospital or clinic by a specially trained health care professional. Talk to your pediatrician regarding the use of this medicine in children. Special care may be needed. Overdosage: If you think you have taken too much of this medicine contact a poison control center or emergency room at once. NOTE: This medicine is only for you. Do not share this medicine with others. What if I miss a dose? It is important not to miss your dose. Call your doctor or health care professional if you are unable to keep an appointment. What may interact with this medicine? Do not take this medicine with any of the  following medications:  live virus vaccines This medicine may also interact with the following medications:  medicines that treat or prevent blood clots like warfarin, enoxaparin, and dalteparin This list may not describe all possible interactions. Give your health care provider a list of all the medicines, herbs, non-prescription drugs, or dietary supplements you use. Also tell them if you smoke, drink alcohol, or use illegal drugs. Some items may interact with your medicine. What should I watch for while using this medicine? Visit your doctor for checks on your progress. This drug may make you feel generally unwell. This is not uncommon, as chemotherapy can affect healthy cells as well as cancer cells. Report any side effects. Continue your course of treatment even though you feel ill unless your doctor tells you to stop. In some cases, you may be given additional medicines to help with side effects. Follow all directions for their use. Call your doctor or health care professional for advice if you get a fever, chills or sore throat, or other symptoms of a cold or flu. Do not treat yourself. This drug decreases your body's ability to fight infections. Try to avoid being around people who are sick. This medicine may increase your risk to bruise or bleed. Call your doctor or health care professional if you notice any unusual bleeding. Be careful brushing and flossing your teeth or using a toothpick because you may get an infection or bleed more easily. If you have any dental work done, tell your dentist you are receiving this medicine. Avoid taking products that contain aspirin, acetaminophen, ibuprofen, naproxen, or ketoprofen unless instructed by your doctor. These medicines may hide a fever. Do not become pregnant while taking this medicine. Women should inform their doctor if they wish to become pregnant or think they might be pregnant. There is a potential for serious side effects to an unborn  child. Talk to your health care professional or pharmacist for more information. Do not breast-feed an infant while taking this medicine. Men should inform their doctor if they wish to father a child. This medicine may lower sperm counts. Do not treat diarrhea with over the counter products. Contact your doctor if you have diarrhea that lasts more than 2 days or if it is severe and watery. This medicine can make you more sensitive to the sun. Keep out of the sun. If you  cannot avoid being in the sun, wear protective clothing and use sunscreen. Do not use sun lamps or tanning beds/booths. What side effects may I notice from receiving this medicine? Side effects that you should report to your doctor or health care professional as soon as possible:  allergic reactions like skin rash, itching or hives, swelling of the face, lips, or tongue  low blood counts - this medicine may decrease the number of white blood cells, red blood cells and platelets. You may be at increased risk for infections and bleeding.  signs of infection - fever or chills, cough, sore throat, pain or difficulty passing urine  signs of decreased platelets or bleeding - bruising, pinpoint red spots on the skin, black, tarry stools, blood in the urine  signs of decreased red blood cells - unusually weak or tired, fainting spells, lightheadedness  breathing problems  changes in vision  chest pain  mouth sores  nausea and vomiting  pain, swelling, redness at site where injected  pain, tingling, numbness in the hands or feet  redness, swelling, or sores on hands or feet  stomach pain  unusual bleeding Side effects that usually do not require medical attention (report to your doctor or health care professional if they continue or are bothersome):  changes in finger or toe nails  diarrhea  dry or itchy skin  hair loss  headache  loss of appetite  sensitivity of eyes to the light  stomach upset  unusually  teary eyes This list may not describe all possible side effects. Call your doctor for medical advice about side effects. You may report side effects to FDA at 1-800-FDA-1088. Where should I keep my medicine? This drug is given in a hospital or clinic and will not be stored at home. NOTE: This sheet is a summary. It may not cover all possible information. If you have questions about this medicine, talk to your doctor, pharmacist, or health care provider.  2021 Elsevier/Gold Standard (2019-09-05 15:00:03)

## 2021-03-19 NOTE — Progress Notes (Incomplete)
Nutrition Follow-up:  65 year old male with rectal cancer. He is receiving neoadjuvant Folfox.  Spoke with patient via telephone on 5/31. He reports doing well, denies nausea, vomiting, reports rectal bleeding has stopped and has been able to cut back on pain medications. Patient reports appetite is getting better, eating pasta, vegetable soup, chicken noodle soup, fruit cocktail, cheese crackers. Patient says he "stays on the water" and drinks 1-2 Ensure Plus daily. He reports receiving complimentary case on 5/20. Patient asking for update on Meals on Wheels wait list and is requesting bag of food from pantry. Patient reports Medicaid was approved.     Medications: ***  Labs: ***  Anthropometrics:   Height: *** Weight: *** UBW: *** BMI: ***   Estimated Energy Needs  Kcals: *** Protein: *** Fluid: ***  NUTRITION DIAGNOSIS: ***   MALNUTRITION DIAGNOSIS: ***   INTERVENTION: ***    MONITORING, EVALUATION, GOAL: ***   NEXT VISIT: ***

## 2021-03-20 DIAGNOSIS — Z20822 Contact with and (suspected) exposure to covid-19: Secondary | ICD-10-CM | POA: Diagnosis not present

## 2021-03-21 ENCOUNTER — Inpatient Hospital Stay: Payer: Medicare HMO

## 2021-03-21 ENCOUNTER — Other Ambulatory Visit: Payer: Self-pay

## 2021-03-21 VITALS — BP 133/87 | HR 99 | Temp 98.1°F | Resp 18

## 2021-03-21 DIAGNOSIS — C20 Malignant neoplasm of rectum: Secondary | ICD-10-CM | POA: Diagnosis not present

## 2021-03-21 DIAGNOSIS — Z79899 Other long term (current) drug therapy: Secondary | ICD-10-CM | POA: Diagnosis not present

## 2021-03-21 DIAGNOSIS — Z5111 Encounter for antineoplastic chemotherapy: Secondary | ICD-10-CM | POA: Diagnosis not present

## 2021-03-21 DIAGNOSIS — Z5189 Encounter for other specified aftercare: Secondary | ICD-10-CM | POA: Diagnosis not present

## 2021-03-21 DIAGNOSIS — G620 Drug-induced polyneuropathy: Secondary | ICD-10-CM | POA: Diagnosis not present

## 2021-03-21 MED ORDER — PEGFILGRASTIM-CBQV 6 MG/0.6ML ~~LOC~~ SOSY
6.0000 mg | PREFILLED_SYRINGE | Freq: Once | SUBCUTANEOUS | Status: AC
  Administered 2021-03-21: 6 mg via SUBCUTANEOUS

## 2021-03-21 MED ORDER — HEPARIN SOD (PORK) LOCK FLUSH 100 UNIT/ML IV SOLN
500.0000 [IU] | Freq: Once | INTRAVENOUS | Status: AC | PRN
  Administered 2021-03-21: 500 [IU]
  Filled 2021-03-21: qty 5

## 2021-03-21 MED ORDER — SODIUM CHLORIDE 0.9% FLUSH
10.0000 mL | INTRAVENOUS | Status: DC | PRN
  Administered 2021-03-21: 10 mL
  Filled 2021-03-21: qty 10

## 2021-03-21 NOTE — Patient Instructions (Addendum)
Implanted Port Home Guide An implanted port is a device that is placed under the skin. It is usually placed in the chest. The device can be used to give IV medicine, to take blood, or for dialysis. You may have an implanted port if:  You need IV medicine that would be irritating to the small veins in your hands or arms.  You need IV medicines, such as antibiotics, for a long period of time.  You need IV nutrition for a long period of time.  You need dialysis. When you have a port, your health care provider can choose to use the port instead of veins in your arms for these procedures. You may have fewer limitations when using a port than you would if you used other types of long-term IVs, and you will likely be able to return to normal activities after your incision heals. An implanted port has two main parts:  Reservoir. The reservoir is the part where a needle is inserted to give medicines or draw blood. The reservoir is round. After it is placed, it appears as a small, raised area under your skin.  Catheter. The catheter is a thin, flexible tube that connects the reservoir to a vein. Medicine that is inserted into the reservoir goes into the catheter and then into the vein. How is my port accessed? To access your port:  A numbing cream may be placed on the skin over the port site.  Your health care provider will put on a mask and sterile gloves.  The skin over your port will be cleaned carefully with a germ-killing soap and allowed to dry.  Your health care provider will gently pinch the port and insert a needle into it.  Your health care provider will check for a blood return to make sure the port is in the vein and is not clogged.  If your port needs to remain accessed to get medicine continuously (constant infusion), your health care provider will place a clear bandage (dressing) over the needle site. The dressing and needle will need to be changed every week, or as told by your  health care provider. What is flushing? Flushing helps keep the port from getting clogged. Follow instructions from your health care provider about how and when to flush the port. Ports are usually flushed with saline solution or a medicine called heparin. The need for flushing will depend on how the port is used:  If the port is only used from time to time to give medicines or draw blood, the port may need to be flushed: ? Before and after medicines have been given. ? Before and after blood has been drawn. ? As part of routine maintenance. Flushing may be recommended every 4-6 weeks.  If a constant infusion is running, the port may not need to be flushed.  Throw away any syringes in a disposal container that is meant for sharp items (sharps container). You can buy a sharps container from a pharmacy, or you can make one by using an empty hard plastic bottle with a cover. How long will my port stay implanted? The port can stay in for as long as your health care provider thinks it is needed. When it is time for the port to come out, a surgery will be done to remove it. The surgery will be similar to the procedure that was done to put the port in. Follow these instructions at home:  Flush your port as told by your health care   provider.  If you need an infusion over several days, follow instructions from your health care provider about how to take care of your port site. Make sure you: ? Wash your hands with soap and water before you change your dressing. If soap and water are not available, use alcohol-based hand sanitizer. ? Change your dressing as told by your health care provider. ? Place any used dressings or infusion bags into a plastic bag. Throw that bag in the trash. ? Keep the dressing that covers the needle clean and dry. Do not get it wet. ? Do not use scissors or sharp objects near the tube. ? Keep the tube clamped, unless it is being used.  Check your port site every day for signs  of infection. Check for: ? Redness, swelling, or pain. ? Fluid or blood. ? Pus or a bad smell.  Protect the skin around the port site. ? Avoid wearing bra straps that rub or irritate the site. ? Protect the skin around your port from seat belts. Place a soft pad over your chest if needed.  Bathe or shower as told by your health care provider. The site may get wet as long as you are not actively receiving an infusion.  Return to your normal activities as told by your health care provider. Ask your health care provider what activities are safe for you.  Carry a medical alert card or wear a medical alert bracelet at all times. This will let health care providers know that you have an implanted port in case of an emergency.   Get help right away if:  You have redness, swelling, or pain at the port site.  You have fluid or blood coming from your port site.  You have pus or a bad smell coming from the port site.  You have a fever. Summary  Implanted ports are usually placed in the chest for long-term IV access.  Follow instructions from your health care provider about flushing the port and changing bandages (dressings).  Take care of the area around your port by avoiding clothing that puts pressure on the area, and by watching for signs of infection.  Protect the skin around your port from seat belts. Place a soft pad over your chest if needed.  Get help right away if you have a fever or you have redness, swelling, pain, drainage, or a bad smell at the port site. This information is not intended to replace advice given to you by your health care provider. Make sure you discuss any questions you have with your health care provider. Document Revised: 02/19/2020 Document Reviewed: 02/19/2020 Elsevier Patient Education  2021 [employer]. Pegfilgrastim injection What is this medicine? PEGFILGRASTIM (PEG fil gra stim) is a long-acting granulocyte colony-stimulating factor that stimulates  the growth of neutrophils, a type of white blood cell important in the body's fight against infection. It is used to reduce the incidence of fever and infection in patients with certain types of cancer who are receiving chemotherapy that affects the bone marrow, and to increase survival after being exposed to high doses of radiation. This medicine may be used for other purposes; ask your health care provider or pharmacist if you have questions. COMMON BRAND NAME(S): Rexene Edison, Ziextenzo What should I tell my health care provider before I take this medicine? They need to know if you have any of these conditions:  kidney disease  latex allergy  ongoing radiation therapy  sickle cell disease  skin  reactions to acrylic adhesives (On-Body Injector only)  an unusual or allergic reaction to pegfilgrastim, filgrastim, other medicines, foods, dyes, or preservatives  pregnant or trying to get pregnant  breast-feeding How should I use this medicine? This medicine is for injection under the skin. If you get this medicine at home, you will be taught how to prepare and give the pre-filled syringe or how to use the On-body Injector. Refer to the patient Instructions for Use for detailed instructions. Use exactly as directed. Tell your healthcare provider immediately if you suspect that the On-body Injector may not have performed as intended or if you suspect the use of the On-body Injector resulted in a missed or partial dose. It is important that you put your used needles and syringes in a special sharps container. Do not put them in a trash can. If you do not have a sharps container, call your pharmacist or healthcare provider to get one. Talk to your pediatrician regarding the use of this medicine in children. While this drug may be prescribed for selected conditions, precautions do apply. Overdosage: If you think you have taken too much of this medicine contact a poison  control center or emergency room at once. NOTE: This medicine is only for you. Do not share this medicine with others. What if I miss a dose? It is important not to miss your dose. Call your doctor or health care professional if you miss your dose. If you miss a dose due to an On-body Injector failure or leakage, a new dose should be administered as soon as possible using a single prefilled syringe for manual use. What may interact with this medicine? Interactions have not been studied. This list may not describe all possible interactions. Give your health care provider a list of all the medicines, herbs, non-prescription drugs, or dietary supplements you use. Also tell them if you smoke, drink alcohol, or use illegal drugs. Some items may interact with your medicine. What should I watch for while using this medicine? Your condition will be monitored carefully while you are receiving this medicine. You may need blood work done while you are taking this medicine. Talk to your health care provider about your risk of cancer. You may be more at risk for certain types of cancer if you take this medicine. If you are going to need a MRI, CT scan, or other procedure, tell your doctor that you are using this medicine (On-Body Injector only). What side effects may I notice from receiving this medicine? Side effects that you should report to your doctor or health care professional as soon as possible:  allergic reactions (skin rash, itching or hives, swelling of the face, lips, or tongue)  back pain  dizziness  fever  pain, redness, or irritation at site where injected  pinpoint red spots on the skin  red or dark-brown urine  shortness of breath or breathing problems  stomach or side pain, or pain at the shoulder  swelling  tiredness  trouble passing urine or change in the amount of urine  unusual bruising or bleeding Side effects that usually do not require medical attention (report to  your doctor or health care professional if they continue or are bothersome):  bone pain  muscle pain This list may not describe all possible side effects. Call your doctor for medical advice about side effects. You may report side effects to FDA at 1-800-FDA-1088. Where should I keep my medicine? Keep out of the reach of children. If you  are using this medicine at home, you will be instructed on how to store it. Throw away any unused medicine after the expiration date on the label. NOTE: This sheet is a summary. It may not cover all possible information. If you have questions about this medicine, talk to your doctor, pharmacist, or health care provider.  2021 Elsevier/[employer] Standard (2019-10-27 13:20:51)

## 2021-03-24 DIAGNOSIS — Z20822 Contact with and (suspected) exposure to covid-19: Secondary | ICD-10-CM | POA: Diagnosis not present

## 2021-03-24 DIAGNOSIS — C20 Malignant neoplasm of rectum: Secondary | ICD-10-CM | POA: Diagnosis not present

## 2021-03-30 ENCOUNTER — Other Ambulatory Visit: Payer: Self-pay | Admitting: Oncology

## 2021-04-02 ENCOUNTER — Other Ambulatory Visit: Payer: Self-pay

## 2021-04-02 ENCOUNTER — Encounter: Payer: Self-pay | Admitting: Nurse Practitioner

## 2021-04-02 ENCOUNTER — Inpatient Hospital Stay (HOSPITAL_BASED_OUTPATIENT_CLINIC_OR_DEPARTMENT_OTHER): Payer: Medicare HMO | Admitting: Nurse Practitioner

## 2021-04-02 ENCOUNTER — Inpatient Hospital Stay: Payer: Medicare HMO

## 2021-04-02 VITALS — BP 135/87 | HR 84 | Temp 97.8°F | Resp 18 | Ht 66.0 in | Wt 137.2 lb

## 2021-04-02 DIAGNOSIS — Z5111 Encounter for antineoplastic chemotherapy: Secondary | ICD-10-CM | POA: Diagnosis not present

## 2021-04-02 DIAGNOSIS — G620 Drug-induced polyneuropathy: Secondary | ICD-10-CM | POA: Diagnosis not present

## 2021-04-02 DIAGNOSIS — C20 Malignant neoplasm of rectum: Secondary | ICD-10-CM | POA: Diagnosis not present

## 2021-04-02 DIAGNOSIS — Z79899 Other long term (current) drug therapy: Secondary | ICD-10-CM | POA: Diagnosis not present

## 2021-04-02 DIAGNOSIS — Z5189 Encounter for other specified aftercare: Secondary | ICD-10-CM | POA: Diagnosis not present

## 2021-04-02 LAB — CBC WITH DIFFERENTIAL (CANCER CENTER ONLY)
Abs Immature Granulocytes: 0.3 10*3/uL — ABNORMAL HIGH (ref 0.00–0.07)
Basophils Absolute: 0 10*3/uL (ref 0.0–0.1)
Basophils Relative: 0 %
Eosinophils Absolute: 0.3 10*3/uL (ref 0.0–0.5)
Eosinophils Relative: 2 %
HCT: 36.1 % — ABNORMAL LOW (ref 39.0–52.0)
Hemoglobin: 11.5 g/dL — ABNORMAL LOW (ref 13.0–17.0)
Immature Granulocytes: 3 %
Lymphocytes Relative: 27 %
Lymphs Abs: 3.1 10*3/uL (ref 0.7–4.0)
MCH: 30.7 pg (ref 26.0–34.0)
MCHC: 31.9 g/dL (ref 30.0–36.0)
MCV: 96.5 fL (ref 80.0–100.0)
Monocytes Absolute: 0.8 10*3/uL (ref 0.1–1.0)
Monocytes Relative: 7 %
Neutro Abs: 7.1 10*3/uL (ref 1.7–7.7)
Neutrophils Relative %: 61 %
Platelet Count: 213 10*3/uL (ref 150–400)
RBC: 3.74 MIL/uL — ABNORMAL LOW (ref 4.22–5.81)
RDW: 15.6 % — ABNORMAL HIGH (ref 11.5–15.5)
WBC Count: 11.7 10*3/uL — ABNORMAL HIGH (ref 4.0–10.5)
nRBC: 0 % (ref 0.0–0.2)

## 2021-04-02 LAB — CMP (CANCER CENTER ONLY)
ALT: 7 U/L (ref 0–44)
AST: 16 U/L (ref 15–41)
Albumin: 4 g/dL (ref 3.5–5.0)
Alkaline Phosphatase: 95 U/L (ref 38–126)
Anion gap: 10 (ref 5–15)
BUN: 6 mg/dL — ABNORMAL LOW (ref 8–23)
CO2: 24 mmol/L (ref 22–32)
Calcium: 8.8 mg/dL — ABNORMAL LOW (ref 8.9–10.3)
Chloride: 104 mmol/L (ref 98–111)
Creatinine: 0.84 mg/dL (ref 0.61–1.24)
GFR, Estimated: >60 mL/min (ref >60)
Glucose, Bld: 80 mg/dL (ref 70–99)
Potassium: 3.7 mmol/L (ref 3.5–5.1)
Sodium: 138 mmol/L (ref 135–145)
Total Bilirubin: 0.3 mg/dL (ref 0.3–1.2)
Total Protein: 7.3 g/dL (ref 6.5–8.1)

## 2021-04-02 MED ORDER — SODIUM CHLORIDE 0.9 % IV SOLN
10.0000 mg | Freq: Once | INTRAVENOUS | Status: AC
  Administered 2021-04-02: 10 mg via INTRAVENOUS
  Filled 2021-04-02: qty 1

## 2021-04-02 MED ORDER — DEXTROSE 5 % IV SOLN
Freq: Once | INTRAVENOUS | Status: AC
  Filled 2021-04-02: qty 250

## 2021-04-02 MED ORDER — SODIUM CHLORIDE 0.9 % IV SOLN
2400.0000 mg/m2 | INTRAVENOUS | Status: DC
  Administered 2021-04-02: 4100 mg via INTRAVENOUS
  Filled 2021-04-02: qty 82

## 2021-04-02 MED ORDER — LEUCOVORIN CALCIUM INJECTION 350 MG
400.0000 mg/m2 | Freq: Once | INTRAVENOUS | Status: AC
  Administered 2021-04-02: 684 mg via INTRAVENOUS
  Filled 2021-04-02: qty 34.2

## 2021-04-02 MED ORDER — PALONOSETRON HCL INJECTION 0.25 MG/5ML
0.2500 mg | Freq: Once | INTRAVENOUS | Status: AC
Start: 2021-04-02 — End: 2021-04-02
  Administered 2021-04-02: 0.25 mg via INTRAVENOUS
  Filled 2021-04-02: qty 5

## 2021-04-02 MED ORDER — TRAMADOL HCL 50 MG PO TABS: 50.0000 mg | ORAL_TABLET | Freq: Two times a day (BID) | ORAL | 0 refills | Status: DC | PRN

## 2021-04-02 MED ORDER — OXALIPLATIN CHEMO INJECTION 100 MG/20ML
85.0000 mg/m2 | Freq: Once | INTRAVENOUS | Status: AC
  Administered 2021-04-02: 145 mg via INTRAVENOUS
  Filled 2021-04-02: qty 29

## 2021-04-02 MED ORDER — FLUOROURACIL CHEMO INJECTION 2.5 GM/50ML
400.0000 mg/m2 | Freq: Once | INTRAVENOUS | Status: AC
  Administered 2021-04-02: 700 mg via INTRAVENOUS
  Filled 2021-04-02: qty 14

## 2021-04-02 NOTE — Addendum Note (Signed)
Addended by: Owens Shark on: 04/02/2021 12:51 PM   Modules accepted: Orders

## 2021-04-02 NOTE — Patient Instructions (Signed)
Phillip Brooks   Discharge Instructions: Thank you for choosing [facility] to provide your oncology and hematology care.   If you have a lab appointment with the [facility], please go directly to the [facility] and check in at the registration area.   Wear comfortable clothing and clothing appropriate for easy access to any Portacath or PICC line.   We strive to give you quality time with your provider. You may need to reschedule your appointment if you arrive late (15 or more minutes).  Arriving late affects you and other patients whose appointments are after yours.  Also, if you miss three or more appointments without notifying the office, you may be dismissed from the clinic at the provider's discretion.      For prescription refill requests, have your pharmacy contact our office and allow 72 hours for refills to be completed.    Today you received the following chemotherapy and/or immunotherapy agents Oxaliplatin (ELOXATIN), Leucovorin & Flourouracil (ADRUCIL).      To help prevent nausea and vomiting after your treatment, we encourage you to take your nausea medication as directed.  BELOW ARE SYMPTOMS THAT SHOULD BE REPORTED IMMEDIATELY: *FEVER GREATER THAN 100.4 F (38 C) OR HIGHER *CHILLS OR SWEATING *NAUSEA AND VOMITING THAT IS NOT CONTROLLED WITH YOUR NAUSEA MEDICATION *UNUSUAL SHORTNESS OF BREATH *UNUSUAL BRUISING OR BLEEDING *URINARY PROBLEMS (pain or burning when urinating, or frequent urination) *BOWEL PROBLEMS (unusual diarrhea, constipation, pain near the anus) TENDERNESS IN MOUTH AND THROAT WITH OR WITHOUT PRESENCE OF ULCERS (sore throat, sores in mouth, or a toothache) UNUSUAL RASH, SWELLING OR PAIN  UNUSUAL VAGINAL DISCHARGE OR ITCHING   Items with * indicate a potential emergency and should be followed up as soon as possible or go to the Emergency Department if any problems should occur.  Please show the CHEMOTHERAPY ALERT  CARD or IMMUNOTHERAPY ALERT CARD at check-in to the Emergency Department and triage nurse.  Should you have questions after your visit or need to cancel or reschedule your appointment, please contact [facility]  Dept: [phone]  and follow the prompts.  Office hours are 8:00 a.m. to 4:30 p.m. Monday - Friday. Please note that voicemails left after 4:00 p.m. may not be returned until the following business day.  We are closed weekends and major holidays. You have access to a nurse at all times for urgent questions. Please call the main number to the clinic Dept: [phone] and follow the prompts.   For any non-urgent questions, you may also contact your provider using MyChart. We now offer e-Visits for anyone 50 and older to request care online for non-urgent symptoms. For details visit [email or web address].   Also download the MyChart app! Go to the app store, search "MyChart", open the app, select [facility], and log in with your MyChart username and password.  Due to Covid, a mask is required upon entering the hospital/clinic. If you do not have a mask, one will be given to you upon arrival. For doctor visits, patients may have 1 support person aged 68 or older with them. For treatment visits, patients cannot have anyone with them due to current Covid guidelines and our immunocompromised population.   Oxaliplatin Injection What is this medication? OXALIPLATIN (ox AL i PLA tin) is a chemotherapy drug. It targets fast dividing cells, like cancer cells, and causes these cells to die. This medicine is usedto treat cancers of the colon and rectum, and many  other cancers. This medicine may be used for other purposes; ask your health care provider orpharmacist if you have questions. COMMON BRAND NAME(S): Eloxatin What should I tell my care team before I take this medication? They need to know if you have any of these conditions: heart disease history of irregular  heartbeat liver disease low blood counts, like white cells, platelets, or red blood cells lung or breathing disease, like asthma take medicines that treat or prevent blood clots tingling of the fingers or toes, or other nerve disorder an unusual or allergic reaction to oxaliplatin, other chemotherapy, other medicines, foods, dyes, or preservatives pregnant or trying to get pregnant breast-feeding How should I use this medication? This drug is given as an infusion into a vein. It is administered in a hospitalor clinic by a specially trained health care professional. Talk to your pediatrician regarding the use of this medicine in children.Special care may be needed. Overdosage: If you think you have taken too much of this medicine contact apoison control center or emergency room at once. NOTE: This medicine is only for you. Do not share this medicine with others. What if I miss a dose? It is important not to miss a dose. Call your doctor or health careprofessional if you are unable to keep an appointment. What may interact with this medication? Do not take this medicine with any of the following medications: cisapride dronedarone pimozide thioridazine This medicine may also interact with the following medications: aspirin and aspirin-like medicines certain medicines that treat or prevent blood clots like warfarin, apixaban, dabigatran, and rivaroxaban cisplatin cyclosporine diuretics medicines for infection like acyclovir, adefovir, amphotericin B, bacitracin, cidofovir, foscarnet, ganciclovir, gentamicin, pentamidine, vancomycin NSAIDs, medicines for pain and inflammation, like ibuprofen or naproxen other medicines that prolong the QT interval (an abnormal heart rhythm) pamidronate zoledronic acid This list may not describe all possible interactions. Give your health care provider a list of all the medicines, herbs, non-prescription drugs, or dietary supplements you use. Also tell  them if you smoke, drink alcohol, or use illegaldrugs. Some items may interact with your medicine. What should I watch for while using this medication? Your condition will be monitored carefully while you are receiving thismedicine. You may need blood work done while you are taking this medicine. This medicine may make you feel generally unwell. This is not uncommon as chemotherapy can affect healthy cells as well as cancer cells. Report any side effects. Continue your course of treatment even though you feel ill unless yourhealthcare professional tells you to stop. This medicine can make you more sensitive to cold. Do not drink cold drinks or use ice. Cover exposed skin before coming in contact with cold temperatures or cold objects. When out in cold weather wear warm clothing and cover your mouth and nose to warm the air that goes into your lungs. Tell your doctor if you getsensitive to the cold. Do not become pregnant while taking this medicine or for 9 months after stopping it. Women should inform their health care professional if they wish to become pregnant or think they might be pregnant. Men should not father a child while taking this medicine and for 6 months after stopping it. There is potential for serious side effects to an unborn child. Talk to your health careprofessional for more information. Do not breast-feed a child while taking this medicine or for 3 months afterstopping it. This medicine has caused ovarian failure in some women. This medicine may make it more difficult to get pregnant.  Talk to your health care professional if [place] Sellers concerned about your fertility. This medicine has caused decreased sperm counts in some men. This may make it more difficult to father a child. Talk to your health care professional if [place] Sellers concerned about your fertility. This medicine may increase your risk of getting an infection. Call your health care professional for advice if you get a fever, chills,  or sore throat, or other symptoms of a cold or flu. Do not treat yourself. Try to avoid beingaround people who are sick. Avoid taking medicines that contain aspirin, acetaminophen, ibuprofen, naproxen, or ketoprofen unless instructed by your health care professional.These medicines may hide a fever. Be careful brushing or flossing your teeth or using a toothpick because you may get an infection or bleed more easily. If you have any dental work done, Primary school teacher you are receiving this medicine. What side effects may I notice from receiving this medication? Side effects that you should report to your doctor or health care professionalas soon as possible: allergic reactions like skin rash, itching or hives, swelling of the face, lips, or tongue breathing problems cough low blood counts - this medicine may decrease the number of white blood cells, red blood cells, and platelets. You may be at increased risk for infections and bleeding nausea, vomiting pain, redness, or irritation at site where injected pain, tingling, numbness in the hands or feet signs and symptoms of bleeding such as bloody or black, tarry stools; red or dark brown urine; spitting up blood or brown material that looks like coffee grounds; red spots on the skin; unusual bruising or bleeding from the eyes, gums, or nose signs and symptoms of a dangerous change in heartbeat or heart rhythm like chest pain; dizziness; fast, irregular heartbeat; palpitations; feeling faint or lightheaded; falls signs and symptoms of infection like fever; chills; cough; sore throat; pain or trouble passing urine signs and symptoms of liver injury like dark yellow or brown urine; general ill feeling or flu-like symptoms; light-colored stools; loss of appetite; nausea; right upper belly pain; unusually weak or tired; yellowing of the eyes or skin signs and symptoms of low red blood cells or anemia such as unusually weak or tired; feeling faint or  lightheaded; falls signs and symptoms of muscle injury like dark urine; trouble passing urine or change in the amount of urine; unusually weak or tired; muscle pain; back pain Side effects that usually do not require medical attention (report to yourdoctor or health care professional if they continue or are bothersome): changes in taste diarrhea gas hair loss loss of appetite mouth sores This list may not describe all possible side effects. Call your doctor for medical advice about side effects. You may report side effects to FDA at1-800-FDA-1088. Where should I keep my medication? This drug is given in a hospital or clinic and will not be stored at home. NOTE: This sheet is a summary. It may not cover all possible information. If you have questions about this medicine, talk to your doctor, pharmacist, orhealth care provider.  2022 Elsevier/Gold Standard (2019-02-22 12:20:35)  Leucovorin injection What is this medication? LEUCOVORIN (loo koe VOR in) is used to prevent or treat the harmful effects of some medicines. This medicine is used to treat anemia caused by a low amount of folic acid in the body. It is also used with 5-fluorouracil (5-FU) to treatcolon cancer. This medicine may be used for other purposes; ask your health care provider orpharmacist if you have questions. What should I  tell my care team before I take this medication? They need to know if you have any of these conditions: anemia from low levels of vitamin B-12 in the blood an unusual or allergic reaction to leucovorin, folic acid, other medicines, foods, dyes, or preservatives pregnant or trying to get pregnant breast-feeding How should I use this medication? This medicine is for injection into a muscle or into a vein. It is given by ahealth care professional in a hospital or clinic setting. Talk to your pediatrician regarding the use of this medicine in children.Special care may be needed. Overdosage: If you think you  have taken too much of this medicine contact apoison control center or emergency room at once. NOTE: This medicine is only for you. Do not share this medicine with others. What if I miss a dose? This does not apply. What may interact with this medication? capecitabine fluorouracil phenobarbital phenytoin primidone trimethoprim-sulfamethoxazole This list may not describe all possible interactions. Give your health care provider a list of all the medicines, herbs, non-prescription drugs, or dietary supplements you use. Also tell them if you smoke, drink alcohol, or use illegaldrugs. Some items may interact with your medicine. What should I watch for while using this medication? Your condition will be monitored carefully while you are receiving thismedicine. This medicine may increase the side effects of 5-fluorouracil, 5-FU. Tell your doctor or health care professional if you have diarrhea or mouth sores that donot get better or that get worse. What side effects may I notice from receiving this medication? Side effects that you should report to your doctor or health care professionalas soon as possible: allergic reactions like skin rash, itching or hives, swelling of the face, lips, or tongue breathing problems fever, infection mouth sores unusual bleeding or bruising unusually weak or tired Side effects that usually do not require medical attention (report to yourdoctor or health care professional if they continue or are bothersome): constipation or diarrhea loss of appetite nausea, vomiting This list may not describe all possible side effects. Call your doctor for medical advice about side effects. You may report side effects to FDA at1-800-FDA-1088. Where should I keep my medication? This drug is given in a hospital or clinic and will not be stored at home. NOTE: This sheet is a summary. It may not cover all possible information. If you have questions about this medicine, talk to your  doctor, pharmacist, orhealth care provider.  2022 Elsevier/Gold Standard (2008-04-10 16:50:29) Fluorouracil, 5-FU injection What is this medication? FLUOROURACIL, 5-FU (flure oh YOOR a sil) is a chemotherapy drug. It slows the growth of cancer cells. This medicine is used to treat many types of cancer like breast cancer, colon or rectal cancer, pancreatic cancer, and stomachcancer. This medicine may be used for other purposes; ask your health care provider orpharmacist if you have questions. COMMON BRAND NAME(S): Adrucil What should I tell my care team before I take this medication? They need to know if you have any of these conditions: blood disorders dihydropyrimidine dehydrogenase (DPD) deficiency infection (especially a virus infection such as chickenpox, cold sores, or herpes) kidney disease liver disease malnourished, poor nutrition recent or ongoing radiation therapy an unusual or allergic reaction to fluorouracil, other chemotherapy, other medicines, foods, dyes, or preservatives pregnant or trying to get pregnant breast-feeding How should I use this medication? This drug is given as an infusion or injection into a vein. It is administeredin a hospital or clinic by a specially trained health care professional. Talk to your  pediatrician regarding the use of this medicine in children.Special care may be needed. Overdosage: If you think you have taken too much of this medicine contact apoison control center or emergency room at once. NOTE: This medicine is only for you. Do not share this medicine with others. What if I miss a dose? It is important not to miss your dose. Call your doctor or health careprofessional if you are unable to keep an appointment. What may interact with this medication? Do not take this medicine with any of the following medications: live virus vaccines This medicine may also interact with the following medications: medicines that treat or prevent blood  clots like warfarin, enoxaparin, and dalteparin This list may not describe all possible interactions. Give your health care provider a list of all the medicines, herbs, non-prescription drugs, or dietary supplements you use. Also tell them if you smoke, drink alcohol, or use illegaldrugs. Some items may interact with your medicine. What should I watch for while using this medication? Visit your doctor for checks on your progress. This drug may make you feel generally unwell. This is not uncommon, as chemotherapy can affect healthy cells as well as cancer cells. Report any side effects. Continue your course oftreatment even though you feel ill unless your doctor tells you to stop. In some cases, you may be given additional medicines to help with side effects.Follow all directions for their use. Call your doctor or health care professional for advice if you get a fever, chills or sore throat, or other symptoms of a cold or flu. Do not treat yourself. This drug decreases your body's ability to fight infections. Try toavoid being around people who are sick. This medicine may increase your risk to bruise or bleed. Call your doctor orhealth care professional if you notice any unusual bleeding. Be careful brushing and flossing your teeth or using a toothpick because you may get an infection or bleed more easily. If you have any dental work done,tell your dentist you are receiving this medicine. Avoid taking products that contain aspirin, acetaminophen, ibuprofen, naproxen, or ketoprofen unless instructed by your doctor. These medicines may hide afever. Do not become pregnant while taking this medicine. Women should inform their doctor if they wish to become pregnant or think they might be pregnant. There is a potential for serious side effects to an unborn child. Talk to your health care professional or pharmacist for more information. Do not breast-feed aninfant while taking this medicine. Men should inform  their doctor if they wish to father a child. This medicinemay lower sperm counts. Do not treat diarrhea with over the counter products. Contact your doctor ifyou have diarrhea that lasts more than 2 days or if it is severe and watery. This medicine can make you more sensitive to the sun. Keep out of the sun. If you cannot avoid being in the sun, wear protective clothing and use sunscreen.Do not use sun lamps or tanning beds/booths. What side effects may I notice from receiving this medication? Side effects that you should report to your doctor or health care professionalas soon as possible: allergic reactions like skin rash, itching or hives, swelling of the face, lips, or tongue low blood counts - this medicine may decrease the number of white blood cells, red blood cells and platelets. You may be at increased risk for infections and bleeding. signs of infection - fever or chills, cough, sore throat, pain or difficulty passing urine signs of decreased platelets or bleeding - bruising, pinpoint red  spots on the skin, black, tarry stools, blood in the urine signs of decreased red blood cells - unusually weak or tired, fainting spells, lightheadedness breathing problems changes in vision chest pain mouth sores nausea and vomiting pain, swelling, redness at site where injected pain, tingling, numbness in the hands or feet redness, swelling, or sores on hands or feet stomach pain unusual bleeding Side effects that usually do not require medical attention (report to yourdoctor or health care professional if they continue or are bothersome): changes in finger or toe nails diarrhea dry or itchy skin hair loss headache loss of appetite sensitivity of eyes to the light stomach upset unusually teary eyes This list may not describe all possible side effects. Call your doctor for medical advice about side effects. You may report side effects to FDA at1-800-FDA-1088. Where should I keep my  medication? This drug is given in a hospital or clinic and will not be stored at home. NOTE: This sheet is a summary. It may not cover all possible information. If you have questions about this medicine, talk to your doctor, pharmacist, orhealth care provider.  2022 Elsevier/Gold Standard (2019-09-05 15:00:03)

## 2021-04-02 NOTE — Progress Notes (Signed)
  [facility] OFFICE PROGRESS NOTE   Diagnosis: Rectal cancer  INTERVAL HISTORY:   Phillip Brooks returns as scheduled.  He completed cycle 6 neoadjuvant FOLFOX 03/19/2021.  He has occasional mild nausea.  He intermittently notes "roughness" along the buccal mucosa.  He is able to eat and drink without difficulty.  No diarrhea.  No blood or pain with bowel movements.  Cold sensitivity usually lasts 2 to 3 days.  No persistent neuropathy symptoms.  Objective:  Vital signs in last 24 hours:  Blood pressure 135/87, pulse 84, temperature 97.8 F (36.6 C), temperature source Oral, resp. rate 18, height 5\' 6"  (1.676 m), weight 137 lb 3.2 oz (62.2 kg), SpO2 100 %.    HEENT: No thrush or ulcers. Resp: Lungs clear bilaterally. Cardio: Regular rate and rhythm. GI: Abdomen soft and nontender.  No hepatomegaly. Vascular: No leg edema. Neuro: Vibratory sense mildly decreased over the fingertips per tuning fork exam. Skin: Palms with hyperpigmentation, dry appearance. Port-A-Cath without erythema.   Lab Results:  Lab Results  Component Value Date   WBC 11.7 (H) 04/02/2021   HGB 11.5 (L) 04/02/2021   HCT 36.1 (L) 04/02/2021   MCV 96.5 04/02/2021   PLT 213 04/02/2021   NEUTROABS 7.1 04/02/2021    Imaging:  No results found.  Medications: I have reviewed the patient's current medications.  Assessment/Plan: Rectal cancer Colonoscopy 12/06/2020-partially obstructing mass beginning at 7 cm from the anal verge-biopsy adenocarcinoma CTs 12/10/2020-large circumferential fungating mass at the rectosigmoid junction beginning at 9 cm from the anal verge, enlarged perirectal lymph nodes, prominent bilateral iliac and pelvic sidewall nodes-new compared to a CT from 2018, no evidence of metastatic disease to the chest, pulmonary nodules at the right apex-nonspecific MRI pelvis 12/24/2020-T4b, N2; distance from tumor to the internal anal sphincter 7.3 cm; findings of potential contained  perforation with tract seen potentially extending through the tumor into the mesorectum. Plan for total neoadjuvant therapy Cycle 1 FOLFOX 01/07/2021 Cycle 2 FOLFOX 01/22/2021 Cycle 3 FOLFOX 02/06/2021 Cycle 4 FOLFOX 02/19/2021 (oxaliplatin dose reduced to 65 mg per metered squared due to mild neutropenia) Cycle 5 FOLFOX 03/05/2021, Udenyca Cycle 6 FOLFOX 03/19/2021, Udenyca Cycle 7 FOLFOX 04/02/2021, Udenyca   Pain secondary #1-improved Weight loss-improved Port-A-Cath placement, Dr. 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  He has completed 6 cycles of neoadjuvant FOLFOX.  Plan to proceed with cycle 7 today as scheduled.  We reviewed the CBC from today.  Counts adequate to proceed with treatment.  He will return for lab, follow-up, cycle 8 FOLFOX in 2 weeks.  He will contact the office in the interim with any problems.    Ned Card ANP/GNP-BC   04/02/2021  9:00 AM

## 2021-04-04 ENCOUNTER — Inpatient Hospital Stay: Payer: Medicare HMO

## 2021-04-05 ENCOUNTER — Other Ambulatory Visit: Payer: Self-pay

## 2021-04-05 ENCOUNTER — Inpatient Hospital Stay: Payer: Medicare HMO

## 2021-04-05 VITALS — BP 111/93 | HR 66 | Temp 97.6°F | Resp 19

## 2021-04-05 DIAGNOSIS — Z79899 Other long term (current) drug therapy: Secondary | ICD-10-CM | POA: Diagnosis not present

## 2021-04-05 DIAGNOSIS — Z5111 Encounter for antineoplastic chemotherapy: Secondary | ICD-10-CM | POA: Diagnosis not present

## 2021-04-05 DIAGNOSIS — C20 Malignant neoplasm of rectum: Secondary | ICD-10-CM | POA: Diagnosis not present

## 2021-04-05 DIAGNOSIS — G620 Drug-induced polyneuropathy: Secondary | ICD-10-CM | POA: Diagnosis not present

## 2021-04-05 DIAGNOSIS — Z5189 Encounter for other specified aftercare: Secondary | ICD-10-CM | POA: Diagnosis not present

## 2021-04-05 MED ORDER — HEPARIN SOD (PORK) LOCK FLUSH 100 UNIT/ML IV SOLN
500.0000 [IU] | Freq: Once | INTRAVENOUS | Status: AC | PRN
  Administered 2021-04-05: 500 [IU]
  Filled 2021-04-05: qty 5

## 2021-04-05 MED ORDER — SODIUM CHLORIDE 0.9% FLUSH
10.0000 mL | INTRAVENOUS | Status: DC | PRN
  Administered 2021-04-05: 10 mL
  Filled 2021-04-05: qty 10

## 2021-04-05 MED ORDER — PEGFILGRASTIM-CBQV 6 MG/0.6ML ~~LOC~~ SOSY
6.0000 mg | PREFILLED_SYRINGE | Freq: Once | SUBCUTANEOUS | Status: AC
  Administered 2021-04-05: 6 mg via SUBCUTANEOUS

## 2021-04-05 NOTE — Patient Instructions (Signed)
Pegfilgrastim injection What is this medication? PEGFILGRASTIM (PEG fil gra stim) is a long-acting granulocyte colony-stimulating factor that stimulates the growth of neutrophils, a type of white blood cell important in the body's fight against infection. It is used to reduce the incidence of fever and infection in patients with certain types of cancer who are receiving chemotherapy that affects the bone marrow, and toincrease survival after being exposed to high doses of radiation. This medicine may be used for other purposes; ask your health care provider orpharmacist if you have questions. COMMON BRAND NAME(S): Rexene Edison, Ziextenzo What should I tell my care team before I take this medication? They need to know if you have any of these conditions: kidney disease latex allergy ongoing radiation therapy sickle cell disease skin reactions to acrylic adhesives (On-Body Injector only) an unusual or allergic reaction to pegfilgrastim, filgrastim, other medicines, foods, dyes, or preservatives pregnant or trying to get pregnant breast-feeding How should I use this medication? This medicine is for injection under the skin. If you get this medicine at home, you will be taught how to prepare and give the pre-filled syringe or how to use the On-body Injector. Refer to the patient Instructions for Use for detailed instructions. Use exactly as directed. Tell your healthcare provider immediately if you suspect that the On-body Injector may not have performed as intended or if you suspect the use of the On-body Injector resulted in a missedor partial dose. It is important that you put your used needles and syringes in a special sharps container. Do not put them in a trash can. If you do not have a sharpscontainer, call your pharmacist or healthcare provider to get one. Talk to your pediatrician regarding the use of this medicine in children. Whilethis drug may be prescribed for  selected conditions, precautions do apply. Overdosage: If you think you have taken too much of this medicine contact apoison control center or emergency room at once. NOTE: This medicine is only for you. Do not share this medicine with others. What if I miss a dose? It is important not to miss your dose. Call your doctor or health care professional if you miss your dose. If you miss a dose due to an On-body Injector failure or leakage, a new dose should be administered as soon aspossible using a single prefilled syringe for manual use. What may interact with this medication? Interactions have not been studied. This list may not describe all possible interactions. Give your health care provider a list of all the medicines, herbs, non-prescription drugs, or dietary supplements you use. Also tell them if you smoke, drink alcohol, or use illegaldrugs. Some items may interact with your medicine. What should I watch for while using this medication? Your condition will be monitored carefully while you are receiving thismedicine. You may need blood work done while you are taking this medicine. Talk to your health care provider about your risk of cancer. You may be more atrisk for certain types of cancer if you take this medicine. If you are going to need a MRI, CT scan, or other procedure, tell your doctorthat you are using this medicine (On-Body Injector only). What side effects may I notice from receiving this medication? Side effects that you should report to your doctor or health care professionalas soon as possible: allergic reactions (skin rash, itching or hives, swelling of the face, lips, or tongue) back pain dizziness fever pain, redness, or irritation at site where injected pinpoint red spots on the  skin red or dark-brown urine shortness of breath or breathing problems stomach or side pain, or pain at the shoulder swelling tiredness trouble passing urine or change in the amount of  urine unusual bruising or bleeding Side effects that usually do not require medical attention (report to yourdoctor or health care professional if they continue or are bothersome): bone pain muscle pain This list may not describe all possible side effects. Call your doctor for medical advice about side effects. You may report side effects to FDA at1-800-FDA-1088. Where should I keep my medication? Keep out of the reach of children. If you are using this medicine at home, you will be instructed on how to storeit. Throw away any unused medicine after the expiration date on the label. NOTE: This sheet is a summary. It may not cover all possible information. If you have questions about this medicine, talk to your doctor, pharmacist, orhealth care provider.  2022 Elsevier/Gold Standard (2020-11-01 11:54:14) Implanted [facility] Guide An implanted port is a device that is placed under the skin. It is usually placed in the chest. The device can be used to give IV medicine, to take blood, or for dialysis. You may have an implanted port if: You need IV medicine that would be irritating to the small veins in your hands or arms. You need IV medicines, such as antibiotics, for a long period of time. You need IV nutrition for a long period of time. You need dialysis. When you have a port, your health care provider can choose to use the port instead of veins in your arms for these procedures. You may have fewer limitations when using a port than you would if you used other types of long-term IVs, and you will likely be able to return to normal activities afteryour incision heals. An implanted port has two main parts: Reservoir. The reservoir is the part where a needle is inserted to give medicines or draw blood. The reservoir is round. After it is placed, it appears as a small, raised area under your skin. Catheter. The catheter is a thin, flexible tube that connects the reservoir to a vein. Medicine that is  inserted into the reservoir goes into the catheter and then into the vein. How is my port accessed? To access your port: A numbing cream may be placed on the skin over the port site. Your health care provider will put on a mask and sterile gloves. The skin over your port will be cleaned carefully with a germ-killing soap and allowed to dry. Your health care provider will gently pinch the port and insert a needle into it. Your health care provider will check for a blood return to make sure the port is in the vein and is not clogged. If your port needs to remain accessed to get medicine continuously (constant infusion), your health care provider will place a clear bandage (dressing) over the needle site. The dressing and needle will need to be changed every week, or as told by your health care provider. What is flushing? Flushing helps keep the port from getting clogged. Follow instructions from your health care provider about how and when to flush the port. Ports are usually flushed with saline solution or a medicine called heparin. The need for flushing will depend on how the port is used: If the port is only used from time to time to give medicines or draw blood, the port may need to be flushed: Before and after medicines have been given. Before  and after blood has been drawn. As part of routine maintenance. Flushing may be recommended every 4-6 weeks. If a constant infusion is running, the port may not need to be flushed. Throw away any syringes in a disposal container that is meant for sharp items (sharps container). You can buy a sharps container from a pharmacy, or you can make one by using an empty hard plastic bottle with a cover. How long will my port stay implanted? The port can stay in for as long as your health care provider thinks it is needed. When it is time for the port to come out, a surgery will be done to remove it. The surgery will be similar to the procedure that was done to  putthe port in. Follow these instructions at home:  Flush your port as told by your health care provider. If you need an infusion over several days, follow instructions from your health care provider about how to take care of your port site. Make sure you: Wash your hands with soap and water before you change your dressing. If soap and water are not available, use alcohol-based hand sanitizer. Change your dressing as told by your health care provider. Place any used dressings or infusion bags into a plastic bag. Throw that bag in the trash. Keep the dressing that covers the needle clean and dry. Do not get it wet. Do not use scissors or sharp objects near the tube. Keep the tube clamped, unless it is being used. Check your port site every day for signs of infection. Check for: Redness, swelling, or pain. Fluid or blood. Pus or a bad smell. Protect the skin around the port site. Avoid wearing bra straps that rub or irritate the site. Protect the skin around your port from seat belts. Place a soft pad over your chest if needed. Bathe or shower as told by your health care provider. The site may get wet as long as you are not actively receiving an infusion. Return to your normal activities as told by your health care provider. Ask your health care provider what activities are safe for you. Carry a medical alert card or wear a medical alert bracelet at all times. This will let health care providers know that you have an implanted port in case of an emergency. Get help right away if: You have redness, swelling, or pain at the port site. You have fluid or blood coming from your port site. You have pus or a bad smell coming from the port site. You have a fever. Summary Implanted ports are usually placed in the chest for long-term IV access. Follow instructions from your health care provider about flushing the port and changing bandages (dressings). Take care of the area around your port by  avoiding clothing that puts pressure on the area, and by watching for signs of infection. Protect the skin around your port from seat belts. Place a soft pad over your chest if needed. Get help right away if you have a fever or you have redness, swelling, pain, drainage, or a bad smell at the port site. This information is not intended to replace advice given to you by your health care provider. Make sure you discuss any questions you have with your healthcare provider. Document Revised: 02/19/2020 Document Reviewed: 02/19/2020 Elsevier Patient Education  [place].

## 2021-04-07 ENCOUNTER — Telehealth: Payer: Self-pay | Admitting: *Deleted

## 2021-04-07 NOTE — Telephone Encounter (Signed)
   WOODRUFF SKIRVIN DOB: 1956/05/28 MRN: [id number]   RIDER WAIVER AND RELEASE OF LIABILITY  For purposes of improving physical access to our facilities, [facility] is pleased to partner with third parties to provide [facility] patients or other authorized individuals the option of convenient, on-demand ground transportation services (the [place]") through use of the technology service that enables users to request on-demand ground transportation from independent third-party providers.  By opting to use and accept these [employer], I, the undersigned, hereby agree on behalf of myself, and on behalf of any minor child using the Government social research officer for whom I am the parent or legal guardian, as follows:  Government social research officer provided to me are provided by independent third-party transportation providers who are not [employer] or employees and who are unaffiliated with [employer]. [facility] is neither a transportation carrier nor a common or public carrier. [facility] has no control over the quality or safety of the transportation that occurs as a result of the [employer]. [facility] cannot guarantee that any third-party transportation provider will complete any arranged transportation service. [facility] makes no representation, warranty, or guarantee regarding the reliability, timeliness, quality, safety, suitability, or availability of any of the Transport Services or that they will be error free. I fully understand that traveling by vehicle involves risks and dangers of serious bodily injury, including permanent disability, paralysis, and death. I agree, on behalf of myself and on behalf of any minor child using the Transport Services for whom I am the parent or legal guardian, that the entire risk arising out of my use of the [employer] remains solely with me, to the maximum extent permitted under applicable law. The [employer] are provided "as  is" and "as available." [facility] disclaims all representations and warranties, express, implied or statutory, not expressly set out in these terms, including the implied warranties of merchantability and fitness for a particular purpose. I hereby waive and release [facility], its agents, employees, officers, directors, representatives, insurers, attorneys, assigns, successors, subsidiaries, and affiliates from any and all past, present, or future claims, demands, liabilities, actions, causes of action, or suits of any kind directly or indirectly arising from acceptance and use of the [employer]. I further waive and release [facility] and its affiliates from all present and future liability and responsibility for any injury or death to persons or damages to property caused by or related to the use of the [employer]. I have read this Waiver and Release of Liability, and I understand the terms used in it and their legal significance. This Waiver is freely and voluntarily given with the understanding that my right (as well as the right of any minor child for whom I am the parent or legal guardian using the [employer]) to legal recourse against  in connection with the [employer] is knowingly surrendered in return for use of these services.   I attest that I read the consent document to Valetta Mole, gave Mr. Balsam the opportunity to ask questions and answered the questions asked (if any). I affirm that Valetta Mole then provided consent for he's participation in this program.     Legrand Pitts

## 2021-04-13 ENCOUNTER — Other Ambulatory Visit: Payer: Self-pay | Admitting: Oncology

## 2021-04-14 NOTE — Progress Notes (Signed)
GI Location of Tumor / Histology: Rectal Cancer  Phillip Brooks presented with hematochezia and 20-25 pound weight loss.  MRI Pelvis 12/24/2020: T4b, N2; distance from tumor to the internal anal sphincter 7.3 cm; findings of potential contained perforation with tract seen potentially extending through the tumor into the mesorectum.  CT 12/10/2020: Large circumferential fungating mass at the rectosigmoid junction beginning at 9 cm from the anal verge, enlarged perirectal lymph nodes, prominent bilateral iliac and pelvic sidewall nodes, new compared to a CT from 2018, ne evidence of metastatic disease to the chest, pulmonary nodules at the right apex.  Colonoscopy 12/06/2020: Partially obstructing mass beginning at 7 cm from the anal verge.  Biopsies of Rectosigmoid Mass 12/06/2020    Past/Anticipated interventions by surgeon, if any:   Past/Anticipated interventions by medical oncology, if any:  Dr. Benay Spice 04/02/2021 - Completed 7 cycles of FOLFOX 04/02/2021, cycle 8 04/16/2021   Weight changes, if any: stable  Bowel/Bladder complaints, if any: Reports his bowels are getting back to normal.  Has some urinary frequency on chemo days.  Nausea / Vomiting, if any: No  Pain issues, if any:  Much improved.  Any blood per rectum: None    SAFETY ISSUES: Prior radiation? No Pacemaker/ICD? No Possible current pregnancy? n/a Is the patient on methotrexate? No  Current Complaints/Details: -Port Insertion 01/03/2021

## 2021-04-15 ENCOUNTER — Ambulatory Visit
Admission: RE | Admit: 2021-04-15 | Discharge: 2021-04-15 | Disposition: A | Discharge status: Home / Self Care | Payer: Medicare HMO | Source: Ambulatory Visit | Attending: Radiation Oncology | Admitting: Radiation Oncology

## 2021-04-15 ENCOUNTER — Encounter: Payer: Self-pay | Admitting: Radiation Oncology

## 2021-04-15 ENCOUNTER — Other Ambulatory Visit: Payer: Self-pay

## 2021-04-15 VITALS — BP 134/90 | HR 78 | Temp 96.8°F | Resp 18 | Ht 66.0 in | Wt 138.2 lb

## 2021-04-15 DIAGNOSIS — C20 Malignant neoplasm of rectum: Secondary | ICD-10-CM | POA: Insufficient documentation

## 2021-04-15 NOTE — Progress Notes (Signed)
Radiation Oncology         [phone] ________________________________  Name: Phillip Brooks        MRN: [id number]  Date of Service: 04/15/2021 DOB: 1955/10/29  PI:RJJOAC, Provider Not In  Ladell Pier, MD     REFERRING PHYSICIAN: Ladell Pier, MD   DIAGNOSIS: The encounter diagnosis was Rectal cancer [facility]).   HISTORY OF PRESENT ILLNESS: Phillip Brooks is a 65 y.o. male with a diagnosis of rectal carcinoma.  The patient had been experiencing ongoing hematochezia and approximately 20 to 25 pounds of weight loss in the and persistent bleeding in his stool, he presented to the emergency department on 10/05/2020, he underwent plain film x-rays that were negative for acute findings.  He was encouraged to be evaluated but after being seen in the follow-up resident clinic he was referred to GI, he ultimately underwent colonoscopy on 12/06/2020 which revealed a circumferential ulcerated partially obstructing clearly malignant appearing mass in the rectosigmoid region the mass was 9 cm long but 7 cm above the anal verge.  Biopsies were obtained and were consistent with adenocarcinoma.  CT imaging of the chest abdomen pelvis on 12/10/2020 reveal a large circumferential ulcerated fungating mass of the rectosigmoid junction measuring 7.8 x 5.4 x 4.4 cm with  enlarged perirectal lymph nodes, measuring up to 1 cm.  There are prominent bilateral iliac and pelvic sidewall nodes and no evidence of metastatic disease.  He does have nonspecific lung nodules in the posterior right apex and mild diffuse bronchial wall thickening consistent with bronchiolitis and mild prostatomegaly. MRI of the pelvis on 12/24/20 revealed T4bN2 disease with the tumor 7.3 cm from the internal anal sphincter. There was concerns for contained perforation with tract seen extending possibly through the tumor into the mesorectum. He went on to start FOLFOX on 01/07/21 and has completed 7 cycles, he is due to have his last and 8th cycle  tomorrow. He's seen today to review plans to coordinate chemoRT.   PREVIOUS RADIATION THERAPY: No   PAST MEDICAL HISTORY:  Past Medical History:  Diagnosis Date   Anemia    Rectal cancer ([place])    Stage III   Seasonal allergies        PAST SURGICAL HISTORY: Past Surgical History:  Procedure Laterality Date   COLONOSCOPY     PORTACATH PLACEMENT Right 01/03/2021   Procedure: INSERTION PORT-A-CATH ULTRASOUND GUIDED THROUGH THE RIGHT IJ.;  Surgeon: Leighton Ruff, MD;  Location: [facility];  Service: General;  Laterality: Right;  ROOM 5 STARTING  AT 12:00PM FOR 60 MIN   TONSILLECTOMY     WRIST SURGERY Right      FAMILY HISTORY:  Family History  Problem Relation Age of Onset   Hypertension Mother    Heart attack Father    Cancer Sister    Stomach cancer Neg Hx    Colon cancer Neg Hx    Esophageal cancer Neg Hx    Pancreatic cancer Neg Hx    Rectal cancer Neg Hx      SOCIAL HISTORY:  reports that he has never smoked. His smokeless tobacco use includes snuff and chew. He reports current alcohol use. He reports that he does not use drugs.  The patient is divorced and lives in [place].  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   ALLERGIES: Patient has no known allergies.   MEDICATIONS:  Current Outpatient Medications  Medication Sig Dispense Refill   acetaminophen (TYLENOL) 650 MG CR tablet Take 650 mg by mouth every 8 (eight) hours as needed for pain.     lidocaine-prilocaine (EMLA) cream Apply to portacath site 1-2 hours prior to use (Patient not taking: No sig reported) 30 g 2   prochlorperazine (COMPAZINE) 10 MG tablet Take 1 tablet (10 mg total) by mouth every 6 (six) hours as needed for nausea or vomiting. 30 tablet 2   traMADol (ULTRAM) 50 MG tablet Take 1 tablet (50 mg total) by mouth every 12 (twelve) hours as needed. Do not drive while taking Tramadol 30 tablet  0   No current facility-administered medications for this encounter.     REVIEW OF SYSTEMS: On review of systems, the patient reports that he is doing well. It's been several weeks since he had discomfort in his pelvis and at least 2 months since having rectal bleeding. He reports normal stools as long as he eats a balanced diet and incorporates fruits and vegetables. He denies any abdominal or pelvic pain. No other complaints are verbalized.     PHYSICAL EXAM:  Wt Readings from Last 3 Encounters:  04/02/21 137 lb 3.2 oz (62.2 kg)  03/19/21 138 lb (62.6 kg)  03/05/21 133 lb 6.4 oz (60.5 kg)   Temp Readings from Last 3 Encounters:  04/05/21 97.6 F (36.4 C) (Temporal)  04/02/21 97.8 F (36.6 C) (Oral)  03/21/21 98.1 F (36.7 C) (Oral)   BP Readings from Last 3 Encounters:  04/05/21 (!) 111/93  04/02/21 135/87  03/21/21 133/87   Pulse Readings from Last 3 Encounters:  04/05/21 66  04/02/21 84  03/21/21 99      In general this is a well appearing African-American male in no acute distress.  He's alert and oriented x4 and appropriate throughout the examination. Cardiopulmonary assessment is negative for acute distress and he exhibits normal effort.    ECOG = 1  0 - Asymptomatic (Fully active, able to carry on all predisease activities without restriction)  1 - Symptomatic but completely ambulatory (Restricted in physically strenuous activity but ambulatory and able to carry out work of a light or sedentary nature. For example, light housework, office work)  2 - Symptomatic, <50% in bed during the day (Ambulatory and capable of all self care but unable to carry out any work activities. Up and about more than 50% of waking hours)  3 - Symptomatic, >50% in bed, but not bedbound (Capable of only limited self-care, confined to bed or chair 50% or more of waking hours)  4 - Bedbound (Completely disabled. Cannot carry on any self-care. Totally confined to bed or chair)  5 -  Death   Eustace Pen MM, Creech RH, Tormey DC, et al. [phone]). "Toxicity and response criteria of the [facility]". [place] Oncol. 5 (6): 649-55    LABORATORY DATA:  Lab Results  Component Value Date   WBC 11.7 (H) 04/02/2021   HGB 11.5 (L) 04/02/2021   HCT 36.1 (L) 04/02/2021   MCV 96.5 04/02/2021   PLT 213 04/02/2021   Lab Results  Component Value Date   NA 138 04/02/2021   K 3.7 04/02/2021   CL 104 04/02/2021   CO2 24 04/02/2021   Lab Results  Component Value Date   ALT 7 04/02/2021   AST 16 04/02/2021   ALKPHOS 95 04/02/2021   BILITOT 0.3 04/02/2021      RADIOGRAPHY: No results found.  IMPRESSION/PLAN: 1. Stage IIIC, [id number], adenocarcinoma of the rectum. The patient has done well with chemotherapy clinically and will receive cycle #8 of FOLFOXtomorrow. We reviewed the rationale to proceed with chemoRT followed by subsequent surgery with Dr. Marcello Moores.  We reviewed the risks, benefits, long and short-term effects of radiotherapy, as well as the delivery and logistics of treatment.  The patient is in agreement to proceed. I've also reached out to Dr. Lisbeth Renshaw and Dr. Marcello Moores to see if there is any value in repeating a pelvic MRI given the concerns of perforation on his March 2022 MRI.  Dr. Lisbeth Renshaw is aware of his case however and recommends 5 1/2 weeks of chemoradiation to the rectum and regional nodes of the pelvis. Written consent is obtained and placed in the chart, a copy was provided to the patient. He will simulate next week 04/24/21, and we would anticipate starting treatment on 05/05/21.  In a visit lasting 60 minutes, greater than 50% of the time was spent face to face discussing the patient's condition, in preparation for the discussion, and coordinating the patient's care.     Carola Rhine, [facility]   **Disclaimer: This note was dictated with voice recognition software. Similar sounding words can inadvertently be transcribed and this note may  contain transcription errors which may not have been corrected upon publication of note.**

## 2021-04-16 ENCOUNTER — Inpatient Hospital Stay: Payer: Medicare HMO

## 2021-04-16 ENCOUNTER — Other Ambulatory Visit (HOSPITAL_COMMUNITY): Payer: Self-pay

## 2021-04-16 ENCOUNTER — Inpatient Hospital Stay (HOSPITAL_BASED_OUTPATIENT_CLINIC_OR_DEPARTMENT_OTHER): Payer: Medicare HMO | Admitting: Oncology

## 2021-04-16 ENCOUNTER — Ambulatory Visit: Payer: Medicare HMO | Admitting: Radiation Oncology

## 2021-04-16 ENCOUNTER — Encounter: Payer: Self-pay | Admitting: Oncology

## 2021-04-16 ENCOUNTER — Telehealth: Payer: Self-pay | Admitting: Pharmacy Technician

## 2021-04-16 VITALS — BP 135/83 | HR 83 | Temp 97.8°F | Resp 18 | Ht 66.0 in | Wt 138.6 lb

## 2021-04-16 DIAGNOSIS — Z5111 Encounter for antineoplastic chemotherapy: Secondary | ICD-10-CM | POA: Diagnosis not present

## 2021-04-16 DIAGNOSIS — C20 Malignant neoplasm of rectum: Secondary | ICD-10-CM

## 2021-04-16 DIAGNOSIS — Z79899 Other long term (current) drug therapy: Secondary | ICD-10-CM | POA: Diagnosis not present

## 2021-04-16 DIAGNOSIS — Z5189 Encounter for other specified aftercare: Secondary | ICD-10-CM | POA: Diagnosis not present

## 2021-04-16 DIAGNOSIS — G620 Drug-induced polyneuropathy: Secondary | ICD-10-CM | POA: Diagnosis not present

## 2021-04-16 LAB — CMP (CANCER CENTER ONLY)
ALT: 9 U/L (ref 0–44)
AST: 18 U/L (ref 15–41)
Albumin: 4.1 g/dL (ref 3.5–5.0)
Alkaline Phosphatase: 95 U/L (ref 38–126)
Anion gap: 11 (ref 5–15)
BUN: 9 mg/dL (ref 8–23)
CO2: 24 mmol/L (ref 22–32)
Calcium: 9.3 mg/dL (ref 8.9–10.3)
Chloride: 105 mmol/L (ref 98–111)
Creatinine: 0.99 mg/dL (ref 0.61–1.24)
GFR, Estimated: >60 mL/min (ref >60)
Glucose, Bld: 92 mg/dL (ref 70–99)
Potassium: 3.7 mmol/L (ref 3.5–5.1)
Sodium: 140 mmol/L (ref 135–145)
Total Bilirubin: 0.2 mg/dL — ABNORMAL LOW (ref 0.3–1.2)
Total Protein: 7.3 g/dL (ref 6.5–8.1)

## 2021-04-16 LAB — CBC WITH DIFFERENTIAL (CANCER CENTER ONLY)
Abs Immature Granulocytes: 0.72 10*3/uL — ABNORMAL HIGH (ref 0.00–0.07)
Basophils Absolute: 0.1 10*3/uL (ref 0.0–0.1)
Basophils Relative: 0 %
Eosinophils Absolute: 0.4 10*3/uL (ref 0.0–0.5)
Eosinophils Relative: 3 %
HCT: 34.8 % — ABNORMAL LOW (ref 39.0–52.0)
Hemoglobin: 10.9 g/dL — ABNORMAL LOW (ref 13.0–17.0)
Immature Granulocytes: 5 %
Lymphocytes Relative: 23 %
Lymphs Abs: 3.2 10*3/uL (ref 0.7–4.0)
MCH: 30.5 pg (ref 26.0–34.0)
MCHC: 31.3 g/dL (ref 30.0–36.0)
MCV: 97.5 fL (ref 80.0–100.0)
Monocytes Absolute: 0.9 10*3/uL (ref 0.1–1.0)
Monocytes Relative: 7 %
Neutro Abs: 8.9 10*3/uL — ABNORMAL HIGH (ref 1.7–7.7)
Neutrophils Relative %: 62 %
Platelet Count: 194 10*3/uL (ref 150–400)
RBC: 3.57 MIL/uL — ABNORMAL LOW (ref 4.22–5.81)
RDW: 14.6 % (ref 11.5–15.5)
WBC Count: 14.1 10*3/uL — ABNORMAL HIGH (ref 4.0–10.5)
nRBC: 0.2 % (ref 0.0–0.2)

## 2021-04-16 MED ORDER — PALONOSETRON HCL INJECTION 0.25 MG/5ML
0.2500 mg | Freq: Once | INTRAVENOUS | Status: AC
  Administered 2021-04-16: 0.25 mg via INTRAVENOUS
  Filled 2021-04-16: qty 5

## 2021-04-16 MED ORDER — FLUOROURACIL CHEMO INJECTION 2.5 GM/50ML
400.0000 mg/m2 | Freq: Once | INTRAVENOUS | Status: AC
  Administered 2021-04-16: 700 mg via INTRAVENOUS
  Filled 2021-04-16: qty 14

## 2021-04-16 MED ORDER — SODIUM CHLORIDE 0.9 % IV SOLN
10.0000 mg | Freq: Once | INTRAVENOUS | Status: AC
  Administered 2021-04-16: 10 mg via INTRAVENOUS
  Filled 2021-04-16: qty 1

## 2021-04-16 MED ORDER — DEXTROSE 5 % IV SOLN
Freq: Once | INTRAVENOUS | Status: AC
  Filled 2021-04-16: qty 250

## 2021-04-16 MED ORDER — CAPECITABINE 500 MG PO TABS
ORAL_TABLET | ORAL | 0 refills | Status: DC
  Filled 2021-04-16: qty 168, fill #0

## 2021-04-16 MED ORDER — SODIUM CHLORIDE 0.9 % IV SOLN
2400.0000 mg/m2 | INTRAVENOUS | Status: DC
  Administered 2021-04-16: 4100 mg via INTRAVENOUS
  Filled 2021-04-16: qty 82

## 2021-04-16 MED ORDER — LEUCOVORIN CALCIUM INJECTION 350 MG
400.0000 mg/m2 | Freq: Once | INTRAVENOUS | Status: AC
  Administered 2021-04-16: 684 mg via INTRAVENOUS
  Filled 2021-04-16: qty 34.2

## 2021-04-16 MED ORDER — OXALIPLATIN CHEMO INJECTION 100 MG/20ML
85.0000 mg/m2 | Freq: Once | INTRAVENOUS | Status: AC
  Administered 2021-04-16: 145 mg via INTRAVENOUS
  Filled 2021-04-16: qty 10

## 2021-04-16 NOTE — Patient Instructions (Signed)
Phillip Brooks   Discharge Instructions: Thank you for choosing [facility] to provide your oncology and hematology care.   If you have a lab appointment with the [facility], please go directly to the [facility] and check in at the registration area.   Wear comfortable clothing and clothing appropriate for easy access to any Portacath or PICC line.   We strive to give you quality time with your provider. You may need to reschedule your appointment if you arrive late (15 or more minutes).  Arriving late affects you and other patients whose appointments are after yours.  Also, if you miss three or more appointments without notifying the office, you may be dismissed from the clinic at the provider's discretion.      For prescription refill requests, have your pharmacy contact our office and allow 72 hours for refills to be completed.    Today you received the following chemotherapy and/or immunotherapy agents Oxaliplatin (ELOXATIN), Leucovorin & Flourouracil (ADRUCIL).      To help prevent nausea and vomiting after your treatment, we encourage you to take your nausea medication as directed.  BELOW ARE SYMPTOMS THAT SHOULD BE REPORTED IMMEDIATELY: *FEVER GREATER THAN 100.4 F (38 C) OR HIGHER *CHILLS OR SWEATING *NAUSEA AND VOMITING THAT IS NOT CONTROLLED WITH YOUR NAUSEA MEDICATION *UNUSUAL SHORTNESS OF BREATH *UNUSUAL BRUISING OR BLEEDING *URINARY PROBLEMS (pain or burning when urinating, or frequent urination) *BOWEL PROBLEMS (unusual diarrhea, constipation, pain near the anus) TENDERNESS IN MOUTH AND THROAT WITH OR WITHOUT PRESENCE OF ULCERS (sore throat, sores in mouth, or a toothache) UNUSUAL RASH, SWELLING OR PAIN  UNUSUAL VAGINAL DISCHARGE OR ITCHING   Items with * indicate a potential emergency and should be followed up as soon as possible or go to the Emergency Department if any problems should occur.  Please show the CHEMOTHERAPY ALERT  CARD or IMMUNOTHERAPY ALERT CARD at check-in to the Emergency Department and triage nurse.  Should you have questions after your visit or need to cancel or reschedule your appointment, please contact [facility]  Dept: [phone]  and follow the prompts.  Office hours are 8:00 a.m. to 4:30 p.m. Monday - Friday. Please note that voicemails left after 4:00 p.m. may not be returned until the following business day.  We are closed weekends and major holidays. You have access to a nurse at all times for urgent questions. Please call the main number to the clinic Dept: [phone] and follow the prompts.   For any non-urgent questions, you may also contact your provider using MyChart. We now offer e-Visits for anyone 50 and older to request care online for non-urgent symptoms. For details visit [email or web address].   Also download the MyChart app! Go to the app store, search "MyChart", open the app, select [facility], and log in with your MyChart username and password.  Due to Covid, a mask is required upon entering the hospital/clinic. If you do not have a mask, one will be given to you upon arrival. For doctor visits, patients may have 1 support person aged 68 or older with them. For treatment visits, patients cannot have anyone with them due to current Covid guidelines and our immunocompromised population.   Oxaliplatin Injection What is this medication? OXALIPLATIN (ox AL i PLA tin) is a chemotherapy drug. It targets fast dividing cells, like cancer cells, and causes these cells to die. This medicine is usedto treat cancers of the colon and rectum, and many  other cancers. This medicine may be used for other purposes; ask your health care provider orpharmacist if you have questions. COMMON BRAND NAME(S): Eloxatin What should I tell my care team before I take this medication? They need to know if you have any of these conditions: heart disease history of irregular  heartbeat liver disease low blood counts, like white cells, platelets, or red blood cells lung or breathing disease, like asthma take medicines that treat or prevent blood clots tingling of the fingers or toes, or other nerve disorder an unusual or allergic reaction to oxaliplatin, other chemotherapy, other medicines, foods, dyes, or preservatives pregnant or trying to get pregnant breast-feeding How should I use this medication? This drug is given as an infusion into a vein. It is administered in a hospitalor clinic by a specially trained health care professional. Talk to your pediatrician regarding the use of this medicine in children.Special care may be needed. Overdosage: If you think you have taken too much of this medicine contact apoison control center or emergency room at once. NOTE: This medicine is only for you. Do not share this medicine with others. What if I miss a dose? It is important not to miss a dose. Call your doctor or health careprofessional if you are unable to keep an appointment. What may interact with this medication? Do not take this medicine with any of the following medications: cisapride dronedarone pimozide thioridazine This medicine may also interact with the following medications: aspirin and aspirin-like medicines certain medicines that treat or prevent blood clots like warfarin, apixaban, dabigatran, and rivaroxaban cisplatin cyclosporine diuretics medicines for infection like acyclovir, adefovir, amphotericin B, bacitracin, cidofovir, foscarnet, ganciclovir, gentamicin, pentamidine, vancomycin NSAIDs, medicines for pain and inflammation, like ibuprofen or naproxen other medicines that prolong the QT interval (an abnormal heart rhythm) pamidronate zoledronic acid This list may not describe all possible interactions. Give your health care provider a list of all the medicines, herbs, non-prescription drugs, or dietary supplements you use. Also tell  them if you smoke, drink alcohol, or use illegaldrugs. Some items may interact with your medicine. What should I watch for while using this medication? Your condition will be monitored carefully while you are receiving thismedicine. You may need blood work done while you are taking this medicine. This medicine may make you feel generally unwell. This is not uncommon as chemotherapy can affect healthy cells as well as cancer cells. Report any side effects. Continue your course of treatment even though you feel ill unless yourhealthcare professional tells you to stop. This medicine can make you more sensitive to cold. Do not drink cold drinks or use ice. Cover exposed skin before coming in contact with cold temperatures or cold objects. When out in cold weather wear warm clothing and cover your mouth and nose to warm the air that goes into your lungs. Tell your doctor if you getsensitive to the cold. Do not become pregnant while taking this medicine or for 9 months after stopping it. Women should inform their health care professional if they wish to become pregnant or think they might be pregnant. Men should not father a child while taking this medicine and for 6 months after stopping it. There is potential for serious side effects to an unborn child. Talk to your health careprofessional for more information. Do not breast-feed a child while taking this medicine or for 3 months afterstopping it. This medicine has caused ovarian failure in some women. This medicine may make it more difficult to get pregnant.  Talk to your health care professional if [place] Sellers concerned about your fertility. This medicine has caused decreased sperm counts in some men. This may make it more difficult to father a child. Talk to your health care professional if [place] Sellers concerned about your fertility. This medicine may increase your risk of getting an infection. Call your health care professional for advice if you get a fever, chills,  or sore throat, or other symptoms of a cold or flu. Do not treat yourself. Try to avoid beingaround people who are sick. Avoid taking medicines that contain aspirin, acetaminophen, ibuprofen, naproxen, or ketoprofen unless instructed by your health care professional.These medicines may hide a fever. Be careful brushing or flossing your teeth or using a toothpick because you may get an infection or bleed more easily. If you have any dental work done, Primary school teacher you are receiving this medicine. What side effects may I notice from receiving this medication? Side effects that you should report to your doctor or health care professionalas soon as possible: allergic reactions like skin rash, itching or hives, swelling of the face, lips, or tongue breathing problems cough low blood counts - this medicine may decrease the number of white blood cells, red blood cells, and platelets. You may be at increased risk for infections and bleeding nausea, vomiting pain, redness, or irritation at site where injected pain, tingling, numbness in the hands or feet signs and symptoms of bleeding such as bloody or black, tarry stools; red or dark brown urine; spitting up blood or brown material that looks like coffee grounds; red spots on the skin; unusual bruising or bleeding from the eyes, gums, or nose signs and symptoms of a dangerous change in heartbeat or heart rhythm like chest pain; dizziness; fast, irregular heartbeat; palpitations; feeling faint or lightheaded; falls signs and symptoms of infection like fever; chills; cough; sore throat; pain or trouble passing urine signs and symptoms of liver injury like dark yellow or brown urine; general ill feeling or flu-like symptoms; light-colored stools; loss of appetite; nausea; right upper belly pain; unusually weak or tired; yellowing of the eyes or skin signs and symptoms of low red blood cells or anemia such as unusually weak or tired; feeling faint or  lightheaded; falls signs and symptoms of muscle injury like dark urine; trouble passing urine or change in the amount of urine; unusually weak or tired; muscle pain; back pain Side effects that usually do not require medical attention (report to yourdoctor or health care professional if they continue or are bothersome): changes in taste diarrhea gas hair loss loss of appetite mouth sores This list may not describe all possible side effects. Call your doctor for medical advice about side effects. You may report side effects to FDA at1-800-FDA-1088. Where should I keep my medication? This drug is given in a hospital or clinic and will not be stored at home. NOTE: This sheet is a summary. It may not cover all possible information. If you have questions about this medicine, talk to your doctor, pharmacist, orhealth care provider.  2022 Elsevier/Gold Standard (2019-02-22 12:20:35)  Leucovorin injection What is this medication? LEUCOVORIN (loo koe VOR in) is used to prevent or treat the harmful effects of some medicines. This medicine is used to treat anemia caused by a low amount of folic acid in the body. It is also used with 5-fluorouracil (5-FU) to treatcolon cancer. This medicine may be used for other purposes; ask your health care provider orpharmacist if you have questions. What should I  tell my care team before I take this medication? They need to know if you have any of these conditions: anemia from low levels of vitamin B-12 in the blood an unusual or allergic reaction to leucovorin, folic acid, other medicines, foods, dyes, or preservatives pregnant or trying to get pregnant breast-feeding How should I use this medication? This medicine is for injection into a muscle or into a vein. It is given by ahealth care professional in a hospital or clinic setting. Talk to your pediatrician regarding the use of this medicine in children.Special care may be needed. Overdosage: If you think you  have taken too much of this medicine contact apoison control center or emergency room at once. NOTE: This medicine is only for you. Do not share this medicine with others. What if I miss a dose? This does not apply. What may interact with this medication? capecitabine fluorouracil phenobarbital phenytoin primidone trimethoprim-sulfamethoxazole This list may not describe all possible interactions. Give your health care provider a list of all the medicines, herbs, non-prescription drugs, or dietary supplements you use. Also tell them if you smoke, drink alcohol, or use illegaldrugs. Some items may interact with your medicine. What should I watch for while using this medication? Your condition will be monitored carefully while you are receiving thismedicine. This medicine may increase the side effects of 5-fluorouracil, 5-FU. Tell your doctor or health care professional if you have diarrhea or mouth sores that donot get better or that get worse. What side effects may I notice from receiving this medication? Side effects that you should report to your doctor or health care professionalas soon as possible: allergic reactions like skin rash, itching or hives, swelling of the face, lips, or tongue breathing problems fever, infection mouth sores unusual bleeding or bruising unusually weak or tired Side effects that usually do not require medical attention (report to yourdoctor or health care professional if they continue or are bothersome): constipation or diarrhea loss of appetite nausea, vomiting This list may not describe all possible side effects. Call your doctor for medical advice about side effects. You may report side effects to FDA at1-800-FDA-1088. Where should I keep my medication? This drug is given in a hospital or clinic and will not be stored at home. NOTE: This sheet is a summary. It may not cover all possible information. If you have questions about this medicine, talk to your  doctor, pharmacist, orhealth care provider.  2022 Elsevier/Gold Standard (2008-04-10 16:50:29)  Fluorouracil, 5-FU injection What is this medication? FLUOROURACIL, 5-FU (flure oh YOOR a sil) is a chemotherapy drug. It slows the growth of cancer cells. This medicine is used to treat many types of cancer like breast cancer, colon or rectal cancer, pancreatic cancer, and stomachcancer. This medicine may be used for other purposes; ask your health care provider orpharmacist if you have questions. COMMON BRAND NAME(S): Adrucil What should I tell my care team before I take this medication? They need to know if you have any of these conditions: blood disorders dihydropyrimidine dehydrogenase (DPD) deficiency infection (especially a virus infection such as chickenpox, cold sores, or herpes) kidney disease liver disease malnourished, poor nutrition recent or ongoing radiation therapy an unusual or allergic reaction to fluorouracil, other chemotherapy, other medicines, foods, dyes, or preservatives pregnant or trying to get pregnant breast-feeding How should I use this medication? This drug is given as an infusion or injection into a vein. It is administeredin a hospital or clinic by a specially trained health care professional. Talk to  your pediatrician regarding the use of this medicine in children.Special care may be needed. Overdosage: If you think you have taken too much of this medicine contact apoison control center or emergency room at once. NOTE: This medicine is only for you. Do not share this medicine with others. What if I miss a dose? It is important not to miss your dose. Call your doctor or health careprofessional if you are unable to keep an appointment. What may interact with this medication? Do not take this medicine with any of the following medications: live virus vaccines This medicine may also interact with the following medications: medicines that treat or prevent blood  clots like warfarin, enoxaparin, and dalteparin This list may not describe all possible interactions. Give your health care provider a list of all the medicines, herbs, non-prescription drugs, or dietary supplements you use. Also tell them if you smoke, drink alcohol, or use illegaldrugs. Some items may interact with your medicine. What should I watch for while using this medication? Visit your doctor for checks on your progress. This drug may make you feel generally unwell. This is not uncommon, as chemotherapy can affect healthy cells as well as cancer cells. Report any side effects. Continue your course oftreatment even though you feel ill unless your doctor tells you to stop. In some cases, you may be given additional medicines to help with side effects.Follow all directions for their use. Call your doctor or health care professional for advice if you get a fever, chills or sore throat, or other symptoms of a cold or flu. Do not treat yourself. This drug decreases your body's ability to fight infections. Try toavoid being around people who are sick. This medicine may increase your risk to bruise or bleed. Call your doctor orhealth care professional if you notice any unusual bleeding. Be careful brushing and flossing your teeth or using a toothpick because you may get an infection or bleed more easily. If you have any dental work done,tell your dentist you are receiving this medicine. Avoid taking products that contain aspirin, acetaminophen, ibuprofen, naproxen, or ketoprofen unless instructed by your doctor. These medicines may hide afever. Do not become pregnant while taking this medicine. Women should inform their doctor if they wish to become pregnant or think they might be pregnant. There is a potential for serious side effects to an unborn child. Talk to your health care professional or pharmacist for more information. Do not breast-feed aninfant while taking this medicine. Men should inform  their doctor if they wish to father a child. This medicinemay lower sperm counts. Do not treat diarrhea with over the counter products. Contact your doctor ifyou have diarrhea that lasts more than 2 days or if it is severe and watery. This medicine can make you more sensitive to the sun. Keep out of the sun. If you cannot avoid being in the sun, wear protective clothing and use sunscreen.Do not use sun lamps or tanning beds/booths. What side effects may I notice from receiving this medication? Side effects that you should report to your doctor or health care professionalas soon as possible: allergic reactions like skin rash, itching or hives, swelling of the face, lips, or tongue low blood counts - this medicine may decrease the number of white blood cells, red blood cells and platelets. You may be at increased risk for infections and bleeding. signs of infection - fever or chills, cough, sore throat, pain or difficulty passing urine signs of decreased platelets or bleeding - bruising, pinpoint  red spots on the skin, black, tarry stools, blood in the urine signs of decreased red blood cells - unusually weak or tired, fainting spells, lightheadedness breathing problems changes in vision chest pain mouth sores nausea and vomiting pain, swelling, redness at site where injected pain, tingling, numbness in the hands or feet redness, swelling, or sores on hands or feet stomach pain unusual bleeding Side effects that usually do not require medical attention (report to yourdoctor or health care professional if they continue or are bothersome): changes in finger or toe nails diarrhea dry or itchy skin hair loss headache loss of appetite sensitivity of eyes to the light stomach upset unusually teary eyes This list may not describe all possible side effects. Call your doctor for medical advice about side effects. You may report side effects to FDA at1-800-FDA-1088. Where should I keep my  medication? This drug is given in a hospital or clinic and will not be stored at home. NOTE: This sheet is a summary. It may not cover all possible information. If you have questions about this medicine, talk to your doctor, pharmacist, orhealth care provider.  2022 Elsevier/Gold Standard (2019-09-05 15:00:03)

## 2021-04-16 NOTE — Telephone Encounter (Signed)
Oral Oncology Patient Advocate Encounter   Received notification from [facility] that prior authorization for Xeloda is required.   PA submitted on CoverMyMeds Key [id number] Status is pending   Oral Oncology Clinic will continue to follow.  [facility] Patient [place] Phone [phone] Fax [phone] 04/17/2021 12:32 PM

## 2021-04-16 NOTE — Progress Notes (Signed)
[facility] OFFICE PROGRESS NOTE   Diagnosis: Rectal cancer  INTERVAL HISTORY:   Mr. Garraway completed another cycle of FOLFOX on 04/02/2021.  He reports cold sensitivity lasting 2 to 3 days following chemotherapy.  No neuropathy symptoms at present.  He had mouth soreness without discrete ulcers.  No nausea or vomiting.  No diarrhea.  No difficulty with bowel function.  Rectal pain remains improved.  Objective:  Vital signs in last 24 hours:  Blood pressure 135/83, pulse 83, temperature 97.8 F (36.6 C), temperature source Oral, resp. rate 18, height 5\' 6"  (1.676 m), weight 138 lb 9.6 oz (62.9 kg), SpO2 99 %.    HEENT: No thrush or ulcers, hyperpigmentation Resp: Lungs clear bilaterally Cardio: Regular rate and rhythm GI: No hepatosplenomegaly, nontender Vascular: No leg edema Neuro: Moderate loss of vibratory sense at the right fingertips, mild loss of vibratory sense at the left fingertips Skin: Hyperpigmentation and dryness of the hands  Portacath/PICC-without erythema  Lab Results:  Lab Results  Component Value Date   WBC 14.1 (H) 04/16/2021   HGB 10.9 (L) 04/16/2021   HCT 34.8 (L) 04/16/2021   MCV 97.5 04/16/2021   PLT 194 04/16/2021   NEUTROABS 8.9 (H) 04/16/2021    CMP  Lab Results  Component Value Date   NA 140 04/16/2021   K 3.7 04/16/2021   CL 105 04/16/2021   CO2 24 04/16/2021   GLUCOSE 92 04/16/2021   BUN 9 04/16/2021   CREATININE 0.99 04/16/2021   CALCIUM 9.3 04/16/2021   PROT 7.3 04/16/2021   ALBUMIN 4.1 04/16/2021   AST 18 04/16/2021   ALT 9 04/16/2021   ALKPHOS 95 04/16/2021   BILITOT 0.2 (L) 04/16/2021   GFRNONAA >60 04/16/2021   GFRAA >60 06/29/2020    Lab Results  Component Value Date   CEA1 3.04 01/07/2021     Medications: I have reviewed the patient's current medications.   Assessment/Plan:  Rectal cancer Colonoscopy 12/06/2020-partially obstructing mass beginning at 7 cm from the anal verge-biopsy  adenocarcinoma CTs 12/10/2020-large circumferential fungating mass at the rectosigmoid junction beginning at 9 cm from the anal verge, enlarged perirectal lymph nodes, prominent bilateral iliac and pelvic sidewall nodes-new compared to a CT from 2018, no evidence of metastatic disease to the chest, pulmonary nodules at the right apex-nonspecific MRI pelvis 12/24/2020-T4b, N2; distance from tumor to the internal anal sphincter 7.3 cm; findings of potential contained perforation with tract seen potentially extending through the tumor into the mesorectum. Plan for total neoadjuvant therapy Cycle 1 FOLFOX 01/07/2021 Cycle 2 FOLFOX 01/22/2021 Cycle 3 FOLFOX 02/06/2021 Cycle 4 FOLFOX 02/19/2021 (oxaliplatin dose reduced to 65 mg per metered squared due to mild neutropenia) Cycle 5 FOLFOX 03/05/2021, Udenyca Cycle 6 FOLFOX 03/19/2021, Udenyca Cycle 7 FOLFOX 04/02/2021, Udenyca Cycle 8 FOLFOX 04/16/2021, Udenyca   Pain secondary #1-improved Weight loss-improved Port-A-Cath placement, Dr. 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 Chadderdon has completed 7 cycles of FOLFOX.  He continues to tolerate the chemotherapy well.  He has mild oxaliplatin neuropathy.  This does not interfere with activity.  He will complete the final cycle of neoadjuvant FOLFOX today.  Mr. Schnackenberg has been evaluated Dr. Lisbeth Renshaw.  The plan is to begin concurrent capecitabine and radiation on 05/05/2021.  We reviewed potential toxicities associated with capecitabine including the chance of mucositis, diarrhea, and hand/foot syndrome.  Mr. Meyer knows to take capecitabine only on days of radiation.  A prescription for  capecitabine was entered today.  He will return for an office and lab visit on 05/01/2021  Betsy Coder, MD  04/16/2021  9:16 AM

## 2021-04-17 DIAGNOSIS — Z20822 Contact with and (suspected) exposure to covid-19: Secondary | ICD-10-CM | POA: Diagnosis not present

## 2021-04-18 ENCOUNTER — Other Ambulatory Visit (HOSPITAL_COMMUNITY): Payer: Self-pay

## 2021-04-18 ENCOUNTER — Telehealth: Payer: Self-pay | Admitting: Pharmacist

## 2021-04-18 ENCOUNTER — Inpatient Hospital Stay: Payer: Medicare HMO | Attending: Oncology

## 2021-04-18 ENCOUNTER — Other Ambulatory Visit: Payer: Self-pay

## 2021-04-18 VITALS — BP 120/83 | HR 85 | Temp 98.2°F | Resp 18

## 2021-04-18 DIAGNOSIS — R634 Abnormal weight loss: Secondary | ICD-10-CM | POA: Insufficient documentation

## 2021-04-18 DIAGNOSIS — G893 Neoplasm related pain (acute) (chronic): Secondary | ICD-10-CM | POA: Insufficient documentation

## 2021-04-18 DIAGNOSIS — C20 Malignant neoplasm of rectum: Secondary | ICD-10-CM | POA: Diagnosis not present

## 2021-04-18 DIAGNOSIS — G620 Drug-induced polyneuropathy: Secondary | ICD-10-CM | POA: Insufficient documentation

## 2021-04-18 DIAGNOSIS — T451X5A Adverse effect of antineoplastic and immunosuppressive drugs, initial encounter: Secondary | ICD-10-CM | POA: Diagnosis not present

## 2021-04-18 DIAGNOSIS — Z5189 Encounter for other specified aftercare: Secondary | ICD-10-CM | POA: Insufficient documentation

## 2021-04-18 DIAGNOSIS — Z1152 Encounter for screening for COVID-19: Secondary | ICD-10-CM | POA: Diagnosis not present

## 2021-04-18 MED ORDER — HEPARIN SOD (PORK) LOCK FLUSH 100 UNIT/ML IV SOLN
500.0000 [IU] | Freq: Once | INTRAVENOUS | Status: AC | PRN
  Administered 2021-04-18: 500 [IU]
  Filled 2021-04-18: qty 5

## 2021-04-18 MED ORDER — PEGFILGRASTIM-CBQV 6 MG/0.6ML ~~LOC~~ SOSY
6.0000 mg | PREFILLED_SYRINGE | Freq: Once | SUBCUTANEOUS | Status: AC
  Administered 2021-04-18: 6 mg via SUBCUTANEOUS

## 2021-04-18 MED ORDER — CAPECITABINE 500 MG PO TABS
1500.0000 mg | ORAL_TABLET | Freq: Two times a day (BID) | ORAL | 0 refills | Status: DC
  Filled 2021-04-18: qty 168, 28d supply, fill #0
  Filled 2021-04-30: qty 120, 28d supply, fill #0
  Filled 2021-06-02: qty 48, 8d supply, fill #1
  Filled ????-??-??: fill #1

## 2021-04-18 MED ORDER — SODIUM CHLORIDE 0.9% FLUSH
10.0000 mL | INTRAVENOUS | Status: DC | PRN
  Administered 2021-04-18: 10 mL
  Filled 2021-04-18: qty 10

## 2021-04-18 NOTE — Telephone Encounter (Signed)
Oral Oncology [facility] Encounter  Prior Authorization for Xeloda has been approved through Medicare Part B benefit.  PA# [id number] Effective dates: 04/17/21 through 10/18/21  Patients co-pay is $24.46 (#120 tabs for 28 day supply)  Oral Oncology Clinic will continue to follow.   Phillip Brooks [place] Phone [phone] Fax [phone] 04/18/2021 8:39 AM

## 2021-04-18 NOTE — Patient Instructions (Signed)

## 2021-04-18 NOTE — Telephone Encounter (Signed)
Oral Oncology Pharmacist Encounter  Received new prescription for Xeloda (capecitabine) for the neoadjuvant treatment of rectal cancer in conjunction with XRT, planned duration until the end of XRT. Planned start 05/05/21.  CMP from 04/16/21 assessed, no relevant lab abnormalities. Prescription dose and frequency assessed.   Current medication list in Epic reviewed, no DDIs with capecitabine identified.  Evaluated chart and no patient barriers to medication adherence identified.   Prescription has been e-scribed to the [facility] for benefits analysis and approval.  Oral Oncology Clinic will continue to follow for insurance authorization, copayment issues, initial counseling and start date.   Darl Pikes, PharmD, BCPS, BCOP, CPP Hematology/Oncology Clinical Pharmacist Practitioner [facility] [phone]  04/18/2021 8:58 AM

## 2021-04-24 ENCOUNTER — Other Ambulatory Visit: Payer: Self-pay

## 2021-04-24 ENCOUNTER — Other Ambulatory Visit: Payer: Self-pay | Admitting: General Surgery

## 2021-04-24 ENCOUNTER — Ambulatory Visit
Admission: RE | Admit: 2021-04-24 | Discharge: 2021-04-24 | Disposition: A | Discharge status: Home / Self Care | Payer: Medicare HMO | Source: Ambulatory Visit | Attending: Radiation Oncology | Admitting: Radiation Oncology

## 2021-04-24 ENCOUNTER — Encounter: Payer: Self-pay | Admitting: Radiation Oncology

## 2021-04-24 DIAGNOSIS — Z510 Encounter for antineoplastic radiation therapy: Secondary | ICD-10-CM | POA: Diagnosis not present

## 2021-04-24 DIAGNOSIS — C20 Malignant neoplasm of rectum: Secondary | ICD-10-CM | POA: Diagnosis not present

## 2021-04-24 NOTE — Progress Notes (Signed)
I spoke with Phillip Brooks this morning to discuss grant info.  I explained what he needs to bring in to qualify.  He stated that he will bring this in as soon as possible.  I gave him mine and Adrienne's card in case he has any additional questions.

## 2021-04-28 ENCOUNTER — Other Ambulatory Visit (HOSPITAL_COMMUNITY): Payer: Self-pay

## 2021-04-28 DIAGNOSIS — Z20822 Contact with and (suspected) exposure to covid-19: Secondary | ICD-10-CM | POA: Diagnosis not present

## 2021-04-29 ENCOUNTER — Other Ambulatory Visit (HOSPITAL_COMMUNITY): Payer: Self-pay

## 2021-04-30 ENCOUNTER — Other Ambulatory Visit (HOSPITAL_COMMUNITY): Payer: Self-pay

## 2021-04-30 DIAGNOSIS — Z20822 Contact with and (suspected) exposure to covid-19: Secondary | ICD-10-CM | POA: Diagnosis not present

## 2021-05-01 ENCOUNTER — Other Ambulatory Visit (HOSPITAL_COMMUNITY): Payer: Self-pay

## 2021-05-01 DIAGNOSIS — Z510 Encounter for antineoplastic radiation therapy: Secondary | ICD-10-CM | POA: Diagnosis not present

## 2021-05-01 DIAGNOSIS — C20 Malignant neoplasm of rectum: Secondary | ICD-10-CM | POA: Diagnosis not present

## 2021-05-01 NOTE — Telephone Encounter (Signed)
Oral Chemotherapy Pharmacist Encounter   I will delivery [name] to him during his appt on Monday 05/05/21. Xeloda education will be provided in person at that time. Dennison Nancy, patient advocate, spoke with Phillip Brooks today and he stated his understanding of the plan.  Darl Pikes, PharmD, BCPS, BCOP, CPP Hematology/Oncology Clinical Pharmacist [facility] [phone]  05/01/2021 5:20 PM

## 2021-05-05 ENCOUNTER — Inpatient Hospital Stay: Payer: Medicare HMO

## 2021-05-05 ENCOUNTER — Other Ambulatory Visit: Payer: Self-pay

## 2021-05-05 ENCOUNTER — Other Ambulatory Visit (HOSPITAL_COMMUNITY): Payer: Self-pay

## 2021-05-05 ENCOUNTER — Ambulatory Visit
Admission: RE | Admit: 2021-05-05 | Discharge: 2021-05-05 | Disposition: A | Discharge status: Home / Self Care | Payer: Medicare HMO | Source: Ambulatory Visit | Attending: Radiation Oncology | Admitting: Radiation Oncology

## 2021-05-05 ENCOUNTER — Encounter: Payer: Self-pay | Admitting: Pharmacist

## 2021-05-05 ENCOUNTER — Telehealth: Payer: Self-pay | Admitting: Pharmacist

## 2021-05-05 ENCOUNTER — Inpatient Hospital Stay: Payer: Medicare HMO | Admitting: Nurse Practitioner

## 2021-05-05 DIAGNOSIS — C20 Malignant neoplasm of rectum: Secondary | ICD-10-CM | POA: Diagnosis not present

## 2021-05-05 DIAGNOSIS — Z20822 Contact with and (suspected) exposure to covid-19: Secondary | ICD-10-CM | POA: Diagnosis not present

## 2021-05-05 DIAGNOSIS — Z510 Encounter for antineoplastic radiation therapy: Secondary | ICD-10-CM | POA: Diagnosis not present

## 2021-05-05 NOTE — Progress Notes (Signed)
Encounter opened in error

## 2021-05-05 NOTE — Telephone Encounter (Signed)
Oral Chemotherapy Pharmacist Encounter  Xeloda was hand delivered to Phillip Brooks in radiation oncology today. He will take first dose when he returns home.   Patient Education I spoke with patient for overview of new oral chemotherapy medication: Xeloda (capecitabine) for the neoadjuvant treatment of rectal cancer in conjunction with XRT, planned duration until the end of XRT. Planned start today, 05/05/21.   Pt is doing well. Counseled patient on administration, dosing, side effects, monitoring, drug-food interactions, safe handling, storage, and disposal. Patient will take 1500 mg twice daily after meals on days receiving radiation (M, Tu, W, Th, F).   Side effects include but not limited to: hand-foot syndrome, mucositis, nausea, vomiting, abdominal pain, fatigue/weakness, and discoloration of the palms of the hands.    Pt provided with Udderly Smooth lotion to use on hands and feet.  Reviewed with patient importance of keeping a medication schedule and plan for any missed doses.  After discussion with patient no patient barriers to medication adherence identified. Pt does have transportation barrier and is currently living at a hotel, pending housing placement. Currently using [facility] for transportation. [facility]eloda is being filled at [facility]. He will need one more fill of Xeloda to finish radiation. [facility] will call him prior to refill. We will follow to ensure refill is obtained given transportation and housing barriers.   Phillip Brooks voiced understanding and appreciation. All questions answered. Medication handout provided.  Provided patient with [facility] phone number. Patient knows to call the office with questions or concerns. Oral Chemotherapy [facility] will continue to follow.  Darl Pikes, PharmD, BCPS, BCOP, CPP Hematology/Oncology Clinical Pharmacist Practitioner [facility] [phone]  05/05/2021 1:57 PM

## 2021-05-06 ENCOUNTER — Other Ambulatory Visit: Payer: Self-pay

## 2021-05-06 ENCOUNTER — Ambulatory Visit
Admission: RE | Admit: 2021-05-06 | Discharge: 2021-05-06 | Disposition: A | Discharge status: Home / Self Care | Payer: Medicare HMO | Source: Ambulatory Visit | Attending: Radiation Oncology | Admitting: Radiation Oncology

## 2021-05-06 ENCOUNTER — Encounter: Payer: Self-pay | Admitting: Nutrition

## 2021-05-06 ENCOUNTER — Telehealth: Payer: Self-pay | Admitting: Oncology

## 2021-05-06 DIAGNOSIS — C20 Malignant neoplasm of rectum: Secondary | ICD-10-CM | POA: Diagnosis not present

## 2021-05-06 DIAGNOSIS — Z20822 Contact with and (suspected) exposure to covid-19: Secondary | ICD-10-CM | POA: Diagnosis not present

## 2021-05-06 DIAGNOSIS — Z510 Encounter for antineoplastic radiation therapy: Secondary | ICD-10-CM | POA: Diagnosis not present

## 2021-05-06 NOTE — Telephone Encounter (Signed)
Called patient - reminder call for 7/20 appt . Patient understands that he has appt here at [facility] location at 845 and radiation at [facility] at 1130am same date.

## 2021-05-06 NOTE — Progress Notes (Signed)
Provided one complimentary case of ensure plus. 

## 2021-05-07 ENCOUNTER — Inpatient Hospital Stay (HOSPITAL_BASED_OUTPATIENT_CLINIC_OR_DEPARTMENT_OTHER): Payer: Medicare HMO | Admitting: Nurse Practitioner

## 2021-05-07 ENCOUNTER — Ambulatory Visit
Admission: RE | Admit: 2021-05-07 | Discharge: 2021-05-07 | Disposition: A | Discharge status: Home / Self Care | Payer: Medicare HMO | Source: Ambulatory Visit | Attending: Radiation Oncology | Admitting: Radiation Oncology

## 2021-05-07 ENCOUNTER — Encounter: Payer: Self-pay | Admitting: Nurse Practitioner

## 2021-05-07 ENCOUNTER — Inpatient Hospital Stay: Payer: Medicare HMO

## 2021-05-07 VITALS — BP 122/68 | HR 77 | Temp 98.1°F | Resp 16 | Wt 136.4 lb

## 2021-05-07 DIAGNOSIS — C20 Malignant neoplasm of rectum: Secondary | ICD-10-CM

## 2021-05-07 DIAGNOSIS — G620 Drug-induced polyneuropathy: Secondary | ICD-10-CM | POA: Diagnosis not present

## 2021-05-07 DIAGNOSIS — Z510 Encounter for antineoplastic radiation therapy: Secondary | ICD-10-CM | POA: Diagnosis not present

## 2021-05-07 DIAGNOSIS — Z5189 Encounter for other specified aftercare: Secondary | ICD-10-CM | POA: Diagnosis not present

## 2021-05-07 DIAGNOSIS — T451X5A Adverse effect of antineoplastic and immunosuppressive drugs, initial encounter: Secondary | ICD-10-CM | POA: Diagnosis not present

## 2021-05-07 DIAGNOSIS — G893 Neoplasm related pain (acute) (chronic): Secondary | ICD-10-CM | POA: Diagnosis not present

## 2021-05-07 DIAGNOSIS — R634 Abnormal weight loss: Secondary | ICD-10-CM | POA: Diagnosis not present

## 2021-05-07 LAB — CMP (CANCER CENTER ONLY)
ALT: 7 U/L (ref 0–44)
AST: 16 U/L (ref 15–41)
Albumin: 4.1 g/dL (ref 3.5–5.0)
Alkaline Phosphatase: 66 U/L (ref 38–126)
Anion gap: 11 (ref 5–15)
BUN: 11 mg/dL (ref 8–23)
CO2: 22 mmol/L (ref 22–32)
Calcium: 8.9 mg/dL (ref 8.9–10.3)
Chloride: 105 mmol/L (ref 98–111)
Creatinine: 0.86 mg/dL (ref 0.61–1.24)
GFR, Estimated: >60 mL/min (ref >60)
Glucose, Bld: 72 mg/dL (ref 70–99)
Potassium: 3.8 mmol/L (ref 3.5–5.1)
Sodium: 138 mmol/L (ref 135–145)
Total Bilirubin: 0.3 mg/dL (ref 0.3–1.2)
Total Protein: 7.1 g/dL (ref 6.5–8.1)

## 2021-05-07 LAB — CBC WITH DIFFERENTIAL (CANCER CENTER ONLY)
Abs Immature Granulocytes: 0.02 10*3/uL (ref 0.00–0.07)
Basophils Absolute: 0 10*3/uL (ref 0.0–0.1)
Basophils Relative: 0 %
Eosinophils Absolute: 0.1 10*3/uL (ref 0.0–0.5)
Eosinophils Relative: 3 %
HCT: 35.1 % — ABNORMAL LOW (ref 39.0–52.0)
Hemoglobin: 11.1 g/dL — ABNORMAL LOW (ref 13.0–17.0)
Immature Granulocytes: 0 %
Lymphocytes Relative: 39 %
Lymphs Abs: 1.8 10*3/uL (ref 0.7–4.0)
MCH: 30.2 pg (ref 26.0–34.0)
MCHC: 31.6 g/dL (ref 30.0–36.0)
MCV: 95.6 fL (ref 80.0–100.0)
Monocytes Absolute: 0.5 10*3/uL (ref 0.1–1.0)
Monocytes Relative: 10 %
Neutro Abs: 2.2 10*3/uL (ref 1.7–7.7)
Neutrophils Relative %: 48 %
Platelet Count: 214 10*3/uL (ref 150–400)
RBC: 3.67 MIL/uL — ABNORMAL LOW (ref 4.22–5.81)
RDW: 13.7 % (ref 11.5–15.5)
WBC Count: 4.6 10*3/uL (ref 4.0–10.5)
nRBC: 0 % (ref 0.0–0.2)

## 2021-05-07 LAB — CEA (ACCESS): CEA (CHCC): 5.64 ng/mL — ABNORMAL HIGH (ref 0.00–5.00)

## 2021-05-07 LAB — CEA (IN HOUSE-CHCC): CEA (CHCC-In House): 6.98 ng/mL — ABNORMAL HIGH (ref 0.00–5.00)
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
## 2021-05-08 ENCOUNTER — Other Ambulatory Visit: Payer: Self-pay

## 2021-05-08 ENCOUNTER — Encounter: Payer: Self-pay | Admitting: *Deleted

## 2021-05-08 ENCOUNTER — Ambulatory Visit
Admission: RE | Admit: 2021-05-08 | Discharge: 2021-05-08 | Disposition: A | Discharge status: Home / Self Care | Payer: Medicare HMO | Source: Ambulatory Visit | Attending: Radiation Oncology | Admitting: Radiation Oncology

## 2021-05-08 DIAGNOSIS — Z510 Encounter for antineoplastic radiation therapy: Secondary | ICD-10-CM | POA: Diagnosis not present

## 2021-05-08 DIAGNOSIS — C20 Malignant neoplasm of rectum: Secondary | ICD-10-CM | POA: Diagnosis not present

## 2021-05-08 NOTE — Progress Notes (Signed)
Faxed demographics, insurance information, and records to [facility], who called to request to f/u on referral order. Faxed to [phone]

## 2021-05-08 NOTE — Progress Notes (Signed)
Pt here for patient teaching.  Pt given Radiation and You booklet and skin care instructions.  Reviewed areas of pertinence such as diarrhea, fatigue, hair loss, sexual and fertility changes, skin changes, and urinary and bladder changes . Pt able to give teach back of to pat skin, use unscented/gentle soap, use baby wipes, have Imodium on hand, drink plenty of water, and sitz bath,avoid applying anything to skin within 4 hours of treatment. Pt verbalizes understanding of information given and will contact nursing with any questions or concerns.     Http://rtanswers.org/treatmentinformation/whattoexpect/index  Gloriajean Dell. Leonie Green, BSN

## 2021-05-09 ENCOUNTER — Ambulatory Visit
Admission: RE | Admit: 2021-05-09 | Discharge: 2021-05-09 | Disposition: A | Discharge status: Home / Self Care | Payer: Medicare HMO | Source: Ambulatory Visit | Attending: Radiation Oncology | Admitting: Radiation Oncology

## 2021-05-09 DIAGNOSIS — Z510 Encounter for antineoplastic radiation therapy: Secondary | ICD-10-CM | POA: Diagnosis not present

## 2021-05-09 DIAGNOSIS — C20 Malignant neoplasm of rectum: Secondary | ICD-10-CM | POA: Diagnosis not present

## 2021-05-12 ENCOUNTER — Ambulatory Visit
Admission: RE | Admit: 2021-05-12 | Discharge: 2021-05-12 | Disposition: A | Discharge status: Home / Self Care | Payer: Medicare HMO | Source: Ambulatory Visit | Attending: Radiation Oncology | Admitting: Radiation Oncology

## 2021-05-12 ENCOUNTER — Other Ambulatory Visit: Payer: Self-pay

## 2021-05-12 DIAGNOSIS — Z510 Encounter for antineoplastic radiation therapy: Secondary | ICD-10-CM | POA: Diagnosis not present

## 2021-05-12 DIAGNOSIS — Z20822 Contact with and (suspected) exposure to covid-19: Secondary | ICD-10-CM | POA: Diagnosis not present

## 2021-05-12 DIAGNOSIS — C20 Malignant neoplasm of rectum: Secondary | ICD-10-CM | POA: Diagnosis not present

## 2021-05-13 ENCOUNTER — Ambulatory Visit
Admission: RE | Admit: 2021-05-13 | Discharge: 2021-05-13 | Disposition: A | Discharge status: Home / Self Care | Payer: Medicare HMO | Source: Ambulatory Visit | Attending: Radiation Oncology | Admitting: Radiation Oncology

## 2021-05-13 DIAGNOSIS — Z510 Encounter for antineoplastic radiation therapy: Secondary | ICD-10-CM | POA: Diagnosis not present

## 2021-05-13 DIAGNOSIS — C20 Malignant neoplasm of rectum: Secondary | ICD-10-CM | POA: Diagnosis not present

## 2021-05-14 ENCOUNTER — Ambulatory Visit: Payer: Medicare HMO

## 2021-05-15 ENCOUNTER — Other Ambulatory Visit: Payer: Self-pay

## 2021-05-15 ENCOUNTER — Ambulatory Visit
Admission: RE | Admit: 2021-05-15 | Discharge: 2021-05-15 | Disposition: A | Discharge status: Home / Self Care | Payer: Medicare HMO | Source: Ambulatory Visit | Attending: Radiation Oncology | Admitting: Radiation Oncology

## 2021-05-15 DIAGNOSIS — Z510 Encounter for antineoplastic radiation therapy: Secondary | ICD-10-CM | POA: Diagnosis not present

## 2021-05-15 DIAGNOSIS — C20 Malignant neoplasm of rectum: Secondary | ICD-10-CM | POA: Diagnosis not present

## 2021-05-15 DIAGNOSIS — Z20822 Contact with and (suspected) exposure to covid-19: Secondary | ICD-10-CM | POA: Diagnosis not present

## 2021-05-16 ENCOUNTER — Ambulatory Visit
Admission: RE | Admit: 2021-05-16 | Discharge: 2021-05-16 | Disposition: A | Discharge status: Home / Self Care | Payer: Medicare HMO | Source: Ambulatory Visit | Attending: Radiation Oncology | Admitting: Radiation Oncology

## 2021-05-16 DIAGNOSIS — C20 Malignant neoplasm of rectum: Secondary | ICD-10-CM | POA: Diagnosis not present

## 2021-05-16 DIAGNOSIS — Z510 Encounter for antineoplastic radiation therapy: Secondary | ICD-10-CM | POA: Diagnosis not present

## 2021-05-19 ENCOUNTER — Ambulatory Visit
Admission: RE | Admit: 2021-05-19 | Discharge: 2021-05-19 | Disposition: A | Discharge status: Home / Self Care | Payer: Medicare HMO | Source: Ambulatory Visit | Attending: Radiation Oncology | Admitting: Radiation Oncology

## 2021-05-19 ENCOUNTER — Other Ambulatory Visit: Payer: Self-pay

## 2021-05-19 DIAGNOSIS — C20 Malignant neoplasm of rectum: Secondary | ICD-10-CM | POA: Insufficient documentation

## 2021-05-19 DIAGNOSIS — Z510 Encounter for antineoplastic radiation therapy: Secondary | ICD-10-CM | POA: Diagnosis not present

## 2021-05-19 DIAGNOSIS — Z20822 Contact with and (suspected) exposure to covid-19: Secondary | ICD-10-CM | POA: Diagnosis not present

## 2021-05-20 ENCOUNTER — Ambulatory Visit
Admission: RE | Admit: 2021-05-20 | Discharge: 2021-05-20 | Disposition: A | Discharge status: Home / Self Care | Payer: Medicare HMO | Source: Ambulatory Visit | Attending: Radiation Oncology | Admitting: Radiation Oncology

## 2021-05-20 DIAGNOSIS — C20 Malignant neoplasm of rectum: Secondary | ICD-10-CM | POA: Diagnosis not present

## 2021-05-20 DIAGNOSIS — Z510 Encounter for antineoplastic radiation therapy: Secondary | ICD-10-CM | POA: Diagnosis not present

## 2021-05-21 ENCOUNTER — Ambulatory Visit
Admission: RE | Admit: 2021-05-21 | Discharge: 2021-05-21 | Disposition: A | Discharge status: Home / Self Care | Payer: Medicare HMO | Source: Ambulatory Visit | Attending: Radiation Oncology | Admitting: Radiation Oncology

## 2021-05-21 ENCOUNTER — Other Ambulatory Visit: Payer: Self-pay

## 2021-05-21 DIAGNOSIS — C20 Malignant neoplasm of rectum: Secondary | ICD-10-CM | POA: Diagnosis not present

## 2021-05-21 DIAGNOSIS — Z510 Encounter for antineoplastic radiation therapy: Secondary | ICD-10-CM | POA: Diagnosis not present

## 2021-05-22 ENCOUNTER — Inpatient Hospital Stay: Payer: Medicare HMO

## 2021-05-22 ENCOUNTER — Inpatient Hospital Stay: Payer: Medicare HMO | Attending: Oncology | Admitting: Oncology

## 2021-05-22 ENCOUNTER — Ambulatory Visit
Admission: RE | Admit: 2021-05-22 | Discharge: 2021-05-22 | Disposition: A | Discharge status: Home / Self Care | Payer: Medicare HMO | Source: Ambulatory Visit | Attending: Radiation Oncology | Admitting: Radiation Oncology

## 2021-05-22 DIAGNOSIS — Z510 Encounter for antineoplastic radiation therapy: Secondary | ICD-10-CM | POA: Diagnosis not present

## 2021-05-22 DIAGNOSIS — R634 Abnormal weight loss: Secondary | ICD-10-CM | POA: Insufficient documentation

## 2021-05-22 DIAGNOSIS — G629 Polyneuropathy, unspecified: Secondary | ICD-10-CM | POA: Insufficient documentation

## 2021-05-22 DIAGNOSIS — Z20822 Contact with and (suspected) exposure to covid-19: Secondary | ICD-10-CM | POA: Diagnosis not present

## 2021-05-22 DIAGNOSIS — C20 Malignant neoplasm of rectum: Secondary | ICD-10-CM | POA: Diagnosis not present

## 2021-05-23 ENCOUNTER — Ambulatory Visit
Admission: RE | Admit: 2021-05-23 | Discharge: 2021-05-23 | Disposition: A | Discharge status: Home / Self Care | Payer: Medicare HMO | Source: Ambulatory Visit | Attending: Radiation Oncology | Admitting: Radiation Oncology

## 2021-05-23 ENCOUNTER — Encounter: Payer: Self-pay | Admitting: Nutrition

## 2021-05-23 DIAGNOSIS — C20 Malignant neoplasm of rectum: Secondary | ICD-10-CM | POA: Diagnosis not present

## 2021-05-23 DIAGNOSIS — Z510 Encounter for antineoplastic radiation therapy: Secondary | ICD-10-CM | POA: Diagnosis not present

## 2021-05-23 NOTE — Progress Notes (Signed)
Provided a complementary case of ensure plus.

## 2021-05-26 ENCOUNTER — Other Ambulatory Visit: Payer: Self-pay

## 2021-05-26 ENCOUNTER — Ambulatory Visit
Admission: RE | Admit: 2021-05-26 | Discharge: 2021-05-26 | Disposition: A | Discharge status: Home / Self Care | Payer: Medicare HMO | Source: Ambulatory Visit | Attending: Radiation Oncology | Admitting: Radiation Oncology

## 2021-05-26 DIAGNOSIS — Z510 Encounter for antineoplastic radiation therapy: Secondary | ICD-10-CM | POA: Diagnosis not present

## 2021-05-26 DIAGNOSIS — Z20822 Contact with and (suspected) exposure to covid-19: Secondary | ICD-10-CM | POA: Diagnosis not present

## 2021-05-26 DIAGNOSIS — C20 Malignant neoplasm of rectum: Secondary | ICD-10-CM | POA: Diagnosis not present

## 2021-05-27 ENCOUNTER — Other Ambulatory Visit (HOSPITAL_COMMUNITY): Payer: Self-pay

## 2021-05-27 ENCOUNTER — Encounter: Payer: Self-pay | Admitting: *Deleted

## 2021-05-27 ENCOUNTER — Ambulatory Visit
Admission: RE | Admit: 2021-05-27 | Discharge: 2021-05-27 | Disposition: A | Discharge status: Home / Self Care | Payer: Medicare HMO | Source: Ambulatory Visit | Attending: Radiation Oncology | Admitting: Radiation Oncology

## 2021-05-27 DIAGNOSIS — Z20822 Contact with and (suspected) exposure to covid-19: Secondary | ICD-10-CM | POA: Diagnosis not present

## 2021-05-27 DIAGNOSIS — C20 Malignant neoplasm of rectum: Secondary | ICD-10-CM | POA: Diagnosis not present

## 2021-05-27 DIAGNOSIS — Z510 Encounter for antineoplastic radiation therapy: Secondary | ICD-10-CM | POA: Diagnosis not present

## 2021-05-27 NOTE — Progress Notes (Signed)
Was a "no show" for his lab/OV on 05/22/21. Scheduling message sent to reschedule for this week or week of 8/15

## 2021-05-28 ENCOUNTER — Other Ambulatory Visit (HOSPITAL_COMMUNITY): Payer: Self-pay

## 2021-05-28 ENCOUNTER — Ambulatory Visit
Admission: RE | Admit: 2021-05-28 | Discharge: 2021-05-28 | Disposition: A | Discharge status: Home / Self Care | Payer: Medicare HMO | Source: Ambulatory Visit | Attending: Radiation Oncology | Admitting: Radiation Oncology

## 2021-05-28 ENCOUNTER — Telehealth: Payer: Self-pay | Admitting: Oncology

## 2021-05-28 ENCOUNTER — Other Ambulatory Visit: Payer: Self-pay

## 2021-05-28 DIAGNOSIS — Z510 Encounter for antineoplastic radiation therapy: Secondary | ICD-10-CM | POA: Diagnosis not present

## 2021-05-28 DIAGNOSIS — C20 Malignant neoplasm of rectum: Secondary | ICD-10-CM | POA: Diagnosis not present

## 2021-05-28 DIAGNOSIS — Z20822 Contact with and (suspected) exposure to covid-19: Secondary | ICD-10-CM | POA: Diagnosis not present

## 2021-05-28 NOTE — Telephone Encounter (Signed)
Unable to reach patient/ VM was full. I called and spoke with his mother and she will get in touch with him regarding appointments added per 8/9 sch msg

## 2021-05-29 ENCOUNTER — Telehealth: Payer: Self-pay | Admitting: *Deleted

## 2021-05-29 ENCOUNTER — Other Ambulatory Visit: Payer: Self-pay | Admitting: Radiation Oncology

## 2021-05-29 ENCOUNTER — Ambulatory Visit
Admission: RE | Admit: 2021-05-29 | Discharge: 2021-05-29 | Disposition: A | Discharge status: Home / Self Care | Payer: Medicare HMO | Source: Ambulatory Visit | Attending: Radiation Oncology | Admitting: Radiation Oncology

## 2021-05-29 DIAGNOSIS — Z510 Encounter for antineoplastic radiation therapy: Secondary | ICD-10-CM | POA: Diagnosis not present

## 2021-05-29 DIAGNOSIS — C20 Malignant neoplasm of rectum: Secondary | ICD-10-CM | POA: Diagnosis not present

## 2021-05-29 MED ORDER — HYDROCORTISONE ACETATE 25 MG RE SUPP: 25.0000 mg | Freq: Two times a day (BID) | RECTAL | 0 refills | Status: DC

## 2021-05-29 NOTE — Telephone Encounter (Signed)
Tried to call the patient to let him know about the changes to the medication that was sent to his pharmacy.  Unable to leave a voicemail as his mailbox was full.  Will attempt to reach patient later.  Gloriajean Dell. Leonie Green, BSN

## 2021-05-30 ENCOUNTER — Ambulatory Visit
Admission: RE | Admit: 2021-05-30 | Discharge: 2021-05-30 | Disposition: A | Discharge status: Home / Self Care | Payer: Medicare HMO | Source: Ambulatory Visit | Attending: Radiation Oncology | Admitting: Radiation Oncology

## 2021-05-30 ENCOUNTER — Other Ambulatory Visit: Payer: Self-pay

## 2021-05-30 DIAGNOSIS — Z510 Encounter for antineoplastic radiation therapy: Secondary | ICD-10-CM | POA: Diagnosis not present

## 2021-05-30 DIAGNOSIS — C20 Malignant neoplasm of rectum: Secondary | ICD-10-CM | POA: Diagnosis not present

## 2021-05-30 DIAGNOSIS — Z1152 Encounter for screening for COVID-19: Secondary | ICD-10-CM | POA: Diagnosis not present

## 2021-06-02 ENCOUNTER — Other Ambulatory Visit: Payer: Self-pay

## 2021-06-02 ENCOUNTER — Other Ambulatory Visit (HOSPITAL_COMMUNITY): Payer: Self-pay

## 2021-06-02 ENCOUNTER — Ambulatory Visit
Admission: RE | Admit: 2021-06-02 | Discharge: 2021-06-02 | Disposition: A | Discharge status: Home / Self Care | Payer: Medicare HMO | Source: Ambulatory Visit | Attending: Radiation Oncology | Admitting: Radiation Oncology

## 2021-06-02 DIAGNOSIS — Z510 Encounter for antineoplastic radiation therapy: Secondary | ICD-10-CM | POA: Diagnosis not present

## 2021-06-02 DIAGNOSIS — C20 Malignant neoplasm of rectum: Secondary | ICD-10-CM | POA: Diagnosis not present

## 2021-06-03 ENCOUNTER — Ambulatory Visit
Admission: RE | Admit: 2021-06-03 | Discharge: 2021-06-03 | Disposition: A | Discharge status: Home / Self Care | Payer: Medicare HMO | Source: Ambulatory Visit | Attending: Radiation Oncology | Admitting: Radiation Oncology

## 2021-06-03 DIAGNOSIS — C20 Malignant neoplasm of rectum: Secondary | ICD-10-CM | POA: Diagnosis not present

## 2021-06-03 DIAGNOSIS — Z510 Encounter for antineoplastic radiation therapy: Secondary | ICD-10-CM | POA: Diagnosis not present

## 2021-06-04 ENCOUNTER — Other Ambulatory Visit: Payer: Self-pay

## 2021-06-04 ENCOUNTER — Ambulatory Visit
Admission: RE | Admit: 2021-06-04 | Discharge: 2021-06-04 | Disposition: A | Discharge status: Home / Self Care | Payer: Medicare HMO | Source: Ambulatory Visit | Attending: Radiation Oncology | Admitting: Radiation Oncology

## 2021-06-04 DIAGNOSIS — Z510 Encounter for antineoplastic radiation therapy: Secondary | ICD-10-CM | POA: Diagnosis not present

## 2021-06-04 DIAGNOSIS — C20 Malignant neoplasm of rectum: Secondary | ICD-10-CM | POA: Diagnosis not present

## 2021-06-05 ENCOUNTER — Ambulatory Visit
Admission: RE | Admit: 2021-06-05 | Discharge: 2021-06-05 | Disposition: A | Discharge status: Home / Self Care | Payer: Medicare HMO | Source: Ambulatory Visit | Attending: Radiation Oncology | Admitting: Radiation Oncology

## 2021-06-05 DIAGNOSIS — Z510 Encounter for antineoplastic radiation therapy: Secondary | ICD-10-CM | POA: Diagnosis not present

## 2021-06-05 DIAGNOSIS — C20 Malignant neoplasm of rectum: Secondary | ICD-10-CM | POA: Diagnosis not present

## 2021-06-06 ENCOUNTER — Other Ambulatory Visit: Payer: Self-pay

## 2021-06-06 ENCOUNTER — Other Ambulatory Visit: Payer: Self-pay | Admitting: Radiation Oncology

## 2021-06-06 ENCOUNTER — Inpatient Hospital Stay: Payer: Medicare HMO | Admitting: Oncology

## 2021-06-06 ENCOUNTER — Inpatient Hospital Stay: Payer: Medicare HMO

## 2021-06-06 ENCOUNTER — Ambulatory Visit
Admission: RE | Admit: 2021-06-06 | Discharge: 2021-06-06 | Disposition: A | Discharge status: Home / Self Care | Payer: Medicare HMO | Source: Ambulatory Visit | Attending: Radiation Oncology | Admitting: Radiation Oncology

## 2021-06-06 DIAGNOSIS — C20 Malignant neoplasm of rectum: Secondary | ICD-10-CM

## 2021-06-06 DIAGNOSIS — Z510 Encounter for antineoplastic radiation therapy: Secondary | ICD-10-CM | POA: Diagnosis not present

## 2021-06-06 MED ORDER — TRAMADOL HCL 50 MG PO TABS: 50.0000 mg | ORAL_TABLET | Freq: Two times a day (BID) | ORAL | 0 refills | Status: DC | PRN

## 2021-06-09 ENCOUNTER — Inpatient Hospital Stay: Payer: Medicare HMO

## 2021-06-09 ENCOUNTER — Ambulatory Visit: Payer: Medicare HMO

## 2021-06-09 ENCOUNTER — Inpatient Hospital Stay (HOSPITAL_BASED_OUTPATIENT_CLINIC_OR_DEPARTMENT_OTHER): Payer: Medicare HMO | Admitting: Nurse Practitioner

## 2021-06-09 ENCOUNTER — Ambulatory Visit
Admission: RE | Admit: 2021-06-09 | Discharge: 2021-06-09 | Disposition: A | Discharge status: Home / Self Care | Payer: Medicare HMO | Source: Ambulatory Visit | Attending: Radiation Oncology | Admitting: Radiation Oncology

## 2021-06-09 ENCOUNTER — Other Ambulatory Visit: Payer: Self-pay

## 2021-06-09 ENCOUNTER — Encounter: Payer: Self-pay | Admitting: Nurse Practitioner

## 2021-06-09 VITALS — BP 140/86 | HR 84 | Temp 97.8°F | Resp 20 | Ht 66.0 in | Wt 134.6 lb

## 2021-06-09 DIAGNOSIS — G629 Polyneuropathy, unspecified: Secondary | ICD-10-CM | POA: Diagnosis not present

## 2021-06-09 DIAGNOSIS — C20 Malignant neoplasm of rectum: Secondary | ICD-10-CM

## 2021-06-09 DIAGNOSIS — Z95828 Presence of other vascular implants and grafts: Secondary | ICD-10-CM

## 2021-06-09 DIAGNOSIS — Z510 Encounter for antineoplastic radiation therapy: Secondary | ICD-10-CM | POA: Diagnosis not present

## 2021-06-09 DIAGNOSIS — R634 Abnormal weight loss: Secondary | ICD-10-CM | POA: Diagnosis not present

## 2021-06-09 LAB — CBC WITH DIFFERENTIAL (CANCER CENTER ONLY)
Abs Immature Granulocytes: 0.01 10*3/uL (ref 0.00–0.07)
Basophils Absolute: 0 10*3/uL (ref 0.0–0.1)
Basophils Relative: 0 %
Eosinophils Absolute: 0.4 10*3/uL (ref 0.0–0.5)
Eosinophils Relative: 7 %
HCT: 32 % — ABNORMAL LOW (ref 39.0–52.0)
Hemoglobin: 10.2 g/dL — ABNORMAL LOW (ref 13.0–17.0)
Immature Granulocytes: 0 %
Lymphocytes Relative: 7 %
Lymphs Abs: 0.4 10*3/uL — ABNORMAL LOW (ref 0.7–4.0)
MCH: 30.2 pg (ref 26.0–34.0)
MCHC: 31.9 g/dL (ref 30.0–36.0)
MCV: 94.7 fL (ref 80.0–100.0)
Monocytes Absolute: 0.5 10*3/uL (ref 0.1–1.0)
Monocytes Relative: 9 %
Neutro Abs: 4 10*3/uL (ref 1.7–7.7)
Neutrophils Relative %: 77 %
Platelet Count: 392 10*3/uL (ref 150–400)
RBC: 3.38 MIL/uL — ABNORMAL LOW (ref 4.22–5.81)
RDW: 13.9 % (ref 11.5–15.5)
WBC Count: 5.2 10*3/uL (ref 4.0–10.5)
nRBC: 0 % (ref 0.0–0.2)

## 2021-06-09 LAB — CMP (CANCER CENTER ONLY)
ALT: 5 U/L (ref 0–44)
AST: 11 U/L — ABNORMAL LOW (ref 15–41)
Albumin: 3.8 g/dL (ref 3.5–5.0)
Alkaline Phosphatase: 55 U/L (ref 38–126)
Anion gap: 9 (ref 5–15)
BUN: 5 mg/dL — ABNORMAL LOW (ref 8–23)
CO2: 25 mmol/L (ref 22–32)
Calcium: 9.4 mg/dL (ref 8.9–10.3)
Chloride: 103 mmol/L (ref 98–111)
Creatinine: 0.79 mg/dL (ref 0.61–1.24)
GFR, Estimated: >60 mL/min (ref >60)
Glucose, Bld: 81 mg/dL (ref 70–99)
Potassium: 3.9 mmol/L (ref 3.5–5.1)
Sodium: 137 mmol/L (ref 135–145)
Total Bilirubin: 0.3 mg/dL (ref 0.3–1.2)
Total Protein: 7.7 g/dL (ref 6.5–8.1)

## 2021-06-09 MED ORDER — HEPARIN SOD (PORK) LOCK FLUSH 100 UNIT/ML IV SOLN
500.0000 [IU] | Freq: Once | INTRAVENOUS | Status: AC
  Administered 2021-06-09: 500 [IU]

## 2021-06-09 MED ORDER — SODIUM CHLORIDE 0.9% FLUSH
10.0000 mL | Freq: Once | INTRAVENOUS | Status: AC
  Administered 2021-06-09: 10 mL

## 2021-06-09 NOTE — Patient Instructions (Signed)
Implanted Port Home Guide An implanted port is a device that is placed under the skin. It is usually placed in the chest. The device can be used to give IV medicine, to take blood, or for dialysis. You may have an implanted port if: You need IV medicine that would be irritating to the small veins in your hands or arms. You need IV medicines, such as antibiotics, for a long period of time. You need IV nutrition for a long period of time. You need dialysis. When you have a port, your health care provider can choose to use the port instead of veins in your arms for these procedures. You may have fewer limitations when using a port than you would if you used other types of long-term IVs, and you will likely be able to return to normal activities afteryour incision heals. An implanted port has two main parts: Reservoir. The reservoir is the part where a needle is inserted to give medicines or draw blood. The reservoir is round. After it is placed, it appears as a small, raised area under your skin. Catheter. The catheter is a thin, flexible tube that connects the reservoir to a vein. Medicine that is inserted into the reservoir goes into the catheter and then into the vein. How is my port accessed? To access your port: A numbing cream may be placed on the skin over the port site. Your health care provider will put on a mask and sterile gloves. The skin over your port will be cleaned carefully with a germ-killing soap and allowed to dry. Your health care provider will gently pinch the port and insert a needle into it. Your health care provider will check for a blood return to make sure the port is in the vein and is not clogged. If your port needs to remain accessed to get medicine continuously (constant infusion), your health care provider will place a clear bandage (dressing) over the needle site. The dressing and needle will need to be changed every week, or as told by your health care provider. What  is flushing? Flushing helps keep the port from getting clogged. Follow instructions from your health care provider about how and when to flush the port. Ports are usually flushed with saline solution or a medicine called heparin. The need for flushing will depend on how the port is used: If the port is only used from time to time to give medicines or draw blood, the port may need to be flushed: Before and after medicines have been given. Before and after blood has been drawn. As part of routine maintenance. Flushing may be recommended every 4-6 weeks. If a constant infusion is running, the port may not need to be flushed. Throw away any syringes in a disposal container that is meant for sharp items (sharps container). You can buy a sharps container from a pharmacy, or you can make one by using an empty hard plastic bottle with a cover. How long will my port stay implanted? The port can stay in for as long as your health care provider thinks it is needed. When it is time for the port to come out, a surgery will be done to remove it. The surgery will be similar to the procedure that was done to putthe port in. Follow these instructions at home:  Flush your port as told by your health care provider. If you need an infusion over several days, follow instructions from your health care provider about how to take   care of your port site. Make sure you: Wash your hands with soap and water before you change your dressing. If soap and water are not available, use alcohol-based hand sanitizer. Change your dressing as told by your health care provider. Place any used dressings or infusion bags into a plastic bag. Throw that bag in the trash. Keep the dressing that covers the needle clean and dry. Do not get it wet. Do not use scissors or sharp objects near the tube. Keep the tube clamped, unless it is being used. Check your port site every day for signs of infection. Check for: Redness, swelling, or  pain. Fluid or blood. Pus or a bad smell. Protect the skin around the port site. Avoid wearing bra straps that rub or irritate the site. Protect the skin around your port from seat belts. Place a soft pad over your chest if needed. Bathe or shower as told by your health care provider. The site may get wet as long as you are not actively receiving an infusion. Return to your normal activities as told by your health care provider. Ask your health care provider what activities are safe for you. Carry a medical alert card or wear a medical alert bracelet at all times. This will let health care providers know that you have an implanted port in case of an emergency. Get help right away if: You have redness, swelling, or pain at the port site. You have fluid or blood coming from your port site. You have pus or a bad smell coming from the port site. You have a fever. Summary Implanted ports are usually placed in the chest for long-term IV access. Follow instructions from your health care provider about flushing the port and changing bandages (dressings). Take care of the area around your port by avoiding clothing that puts pressure on the area, and by watching for signs of infection. Protect the skin around your port from seat belts. Place a soft pad over your chest if needed. Get help right away if you have a fever or you have redness, swelling, pain, drainage, or a bad smell at the port site. This information is not intended to replace advice given to you by your health care provider. Make sure you discuss any questions you have with your healthcare provider. Document Revised: 02/19/2020 Document Reviewed: 02/19/2020 Elsevier Patient Education  2022 Elsevier Inc.  

## 2021-06-09 NOTE — Progress Notes (Signed)
  [place] OFFICE PROGRESS NOTE   Diagnosis: Rectal cancer  INTERVAL HISTORY:   Mr. Tarry returns as scheduled.  He continues radiation/Xeloda.  He denies nausea/vomiting.  No mouth sores.  No diarrhea.  Hands and feet are dry.  He has rectal pain at night time and reports Dr. Lisbeth Renshaw has prescribed a suppository.  No numbness or tingling in the hands or feet.  Objective:  Vital signs in last 24 hours:  Blood pressure 140/86, pulse 84, temperature 97.8 F (36.6 C), temperature source Oral, resp. rate 20, height '5\' 6"'$  (1.676 m), weight 134 lb 9.6 oz (61.1 kg), SpO2 99 %.    HEENT: White coating over tongue.  No ulcers. Resp: Lungs clear bilaterally. Cardio: Regular rate and rhythm. GI: Abdomen soft and nontender.  No hepatomegaly. Vascular: No leg edema. Skin: Palms and soles dry appearing. Port-A-Cath without erythema.   Lab Results:  Lab Results  Component Value Date   WBC 5.2 06/09/2021   HGB 10.2 (L) 06/09/2021   HCT 32.0 (L) 06/09/2021   MCV 94.7 06/09/2021   PLT 392 06/09/2021   NEUTROABS 4.0 06/09/2021    Imaging:  No results found.  Medications: I have reviewed the patient's current medications.  Assessment/Plan: Rectal cancer Colonoscopy 12/06/2020-partially obstructing mass beginning at 7 cm from the anal verge-biopsy adenocarcinoma CTs 12/10/2020-large circumferential fungating mass at the rectosigmoid junction beginning at 9 cm from the anal verge, enlarged perirectal lymph nodes, prominent bilateral iliac and pelvic sidewall nodes-new compared to a CT from 2018, no evidence of metastatic disease to the chest, pulmonary nodules at the right apex-nonspecific MRI pelvis 12/24/2020-T4b, N2; distance from tumor to the internal anal sphincter 7.3 cm; findings of potential contained perforation with tract seen potentially extending through the tumor into the mesorectum. Plan for total neoadjuvant therapy Cycle 1 FOLFOX 01/07/2021 Cycle 2 FOLFOX  01/22/2021 Cycle 3 FOLFOX 02/06/2021 Cycle 4 FOLFOX 02/19/2021 (oxaliplatin dose reduced to 65 mg per metered squared due to mild neutropenia) Cycle 5 FOLFOX 03/05/2021, Udenyca Cycle 6 FOLFOX 03/19/2021, Udenyca Cycle 7 FOLFOX 04/02/2021, Udenyca Cycle 8 FOLFOX 04/16/2021, Udenyca Radiation/Xeloda 05/05/2021-06/12/2021   Pain secondary #1-improved Weight loss-improved Port-A-Cath placement, Dr. 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 Trussel appears stable.  He is currently completing the course of radiation/Xeloda.  Final day of radiation is 06/12/2021.  He understands to discontinue Xeloda coinciding with completion of radiation.  He is scheduled for a pelvic MRI 06/18/2021 and has follow-up with Dr. Leighton Ruff on [id number].  He will return for lab, port flush and follow-up in approximately 8 weeks.  We are available to see him sooner if needed.  Patient seen with Dr. Benay Spice.    Ned Card ANP/GNP-BC   06/09/2021  11:22 AM This was a shared visit with Ned Card.  Mr. Newlon will complete the course of neoadjuvant therapy this week.  He will see Dr. Marcello Moores and have a restaging MRI prior to surgery.  We are hopeful the rectal discomfort will improve when he is a few weeks out from completing radiation.  I was present for greater than 50% of today's visit.  I performed medical decision making.  Julieanne Manson, MD

## 2021-06-10 ENCOUNTER — Ambulatory Visit: Payer: Medicare HMO

## 2021-06-10 ENCOUNTER — Ambulatory Visit
Admission: RE | Admit: 2021-06-10 | Discharge: 2021-06-10 | Disposition: A | Discharge status: Home / Self Care | Payer: Medicare HMO | Source: Ambulatory Visit | Attending: Radiation Oncology | Admitting: Radiation Oncology

## 2021-06-10 ENCOUNTER — Other Ambulatory Visit (HOSPITAL_COMMUNITY): Payer: Self-pay

## 2021-06-10 DIAGNOSIS — Z510 Encounter for antineoplastic radiation therapy: Secondary | ICD-10-CM | POA: Diagnosis not present

## 2021-06-10 DIAGNOSIS — Z1152 Encounter for screening for COVID-19: Secondary | ICD-10-CM | POA: Diagnosis not present

## 2021-06-10 DIAGNOSIS — C20 Malignant neoplasm of rectum: Secondary | ICD-10-CM | POA: Diagnosis not present

## 2021-06-11 ENCOUNTER — Encounter: Payer: Self-pay | Admitting: *Deleted

## 2021-06-11 ENCOUNTER — Ambulatory Visit: Payer: Medicare HMO

## 2021-06-11 ENCOUNTER — Ambulatory Visit
Admission: RE | Admit: 2021-06-11 | Discharge: 2021-06-11 | Disposition: A | Discharge status: Home / Self Care | Payer: Medicare HMO | Source: Ambulatory Visit | Attending: Radiation Oncology | Admitting: Radiation Oncology

## 2021-06-11 DIAGNOSIS — C20 Malignant neoplasm of rectum: Secondary | ICD-10-CM | POA: Diagnosis not present

## 2021-06-11 DIAGNOSIS — Z510 Encounter for antineoplastic radiation therapy: Secondary | ICD-10-CM | POA: Diagnosis not present

## 2021-06-11 NOTE — Progress Notes (Signed)
[place] Work  Clinical Social Work received referral for housing concerns.  CSW contacted patient by phone to offer support and assess for needs.  Patient will need to move from his currently housing due to his roommate selling the home.  CSW has worked with patient on different occasions regarding meals on wheels and housing concerns.  Patient is familiar with the housing authority, and stated he is now actively on the wait list.  CSW has previously provided the [place] [employer] list for [place].  CSW also discussed the socialserve.com website to search available housing options based on income.  Patient stated he was currently at social services waiting to discuss emergency assistance.  Patient stated he called the hotline through [facility] yesterday and planned to call again today to discuss housing/emergency resources.  Patient reported he was now active with meals on wheels and will receive 2 cases a month of frozen meals.  CSW did a brief search on socialserve.com and provided patient with contact information for results listed.  Patient plans to call the apartment complex today.  CSW encouraged patient to call with additional questions or concerns.   Johnnye Lana, MSW, LCSW, OSW-C Clinical Social Worker [facility] [phone]

## 2021-06-12 ENCOUNTER — Ambulatory Visit
Admission: RE | Admit: 2021-06-12 | Discharge: 2021-06-12 | Disposition: A | Discharge status: Home / Self Care | Payer: Medicare HMO | Source: Ambulatory Visit | Attending: Radiation Oncology | Admitting: Radiation Oncology

## 2021-06-12 ENCOUNTER — Encounter: Payer: Self-pay | Admitting: Radiation Oncology

## 2021-06-12 ENCOUNTER — Ambulatory Visit: Payer: Medicare HMO

## 2021-06-12 DIAGNOSIS — Z510 Encounter for antineoplastic radiation therapy: Secondary | ICD-10-CM | POA: Diagnosis not present

## 2021-06-12 DIAGNOSIS — C20 Malignant neoplasm of rectum: Secondary | ICD-10-CM | POA: Diagnosis not present

## 2021-06-16 ENCOUNTER — Encounter: Payer: Self-pay | Admitting: Oncology
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
## 2021-06-18 ENCOUNTER — Encounter: Payer: Self-pay | Admitting: Oncology

## 2021-06-18 ENCOUNTER — Inpatient Hospital Stay: Admission: RE | Admit: 2021-06-18 | Payer: Medicare HMO | Source: Ambulatory Visit

## 2021-06-19 DIAGNOSIS — Z20822 Contact with and (suspected) exposure to covid-19: Secondary | ICD-10-CM | POA: Diagnosis not present

## 2021-06-24 ENCOUNTER — Other Ambulatory Visit: Payer: Self-pay | Admitting: Radiation Oncology

## 2021-06-24 ENCOUNTER — Inpatient Hospital Stay: Admission: RE | Admit: 2021-06-24 | Payer: Medicare HMO | Source: Ambulatory Visit

## 2021-06-24 DIAGNOSIS — C20 Malignant neoplasm of rectum: Secondary | ICD-10-CM

## 2021-06-26 ENCOUNTER — Other Ambulatory Visit: Payer: Medicare HMO

## 2021-06-26 ENCOUNTER — Other Ambulatory Visit: Payer: Self-pay

## 2021-06-26 ENCOUNTER — Ambulatory Visit
Admission: RE | Admit: 2021-06-26 | Discharge: 2021-06-26 | Disposition: A | Discharge status: Home / Self Care | Payer: Medicare HMO | Source: Ambulatory Visit | Attending: General Surgery | Admitting: General Surgery

## 2021-06-26 ENCOUNTER — Encounter: Payer: Self-pay | Admitting: Oncology

## 2021-06-26 DIAGNOSIS — C20 Malignant neoplasm of rectum: Secondary | ICD-10-CM | POA: Diagnosis not present

## 2021-06-29 ENCOUNTER — Other Ambulatory Visit: Payer: Medicare HMO

## 2021-07-01 ENCOUNTER — Ambulatory Visit: Payer: Self-pay | Admitting: General Surgery

## 2021-07-01 ENCOUNTER — Other Ambulatory Visit (HOSPITAL_COMMUNITY): Payer: Self-pay

## 2021-07-01 DIAGNOSIS — E440 Moderate protein-calorie malnutrition: Secondary | ICD-10-CM | POA: Diagnosis not present

## 2021-07-01 DIAGNOSIS — C20 Malignant neoplasm of rectum: Secondary | ICD-10-CM | POA: Diagnosis not present

## 2021-07-01 DIAGNOSIS — Z20822 Contact with and (suspected) exposure to covid-19: Secondary | ICD-10-CM | POA: Diagnosis not present

## 2021-07-01 NOTE — H&P (Signed)
PROVIDER:  Monico Blitz, MD  MRN: [id number] DOB: 1955-12-15 DATE OF ENCOUNTER: 07/01/2021 Subjective   Chief Complaint: No chief complaint on file.     History of Present Illness: Phillip Brooks is a 65 y.o. male who is seen today for f/u for rectal cancer.   65 year old male who presents to the office for evaluation of rectal cancer.  Patient was noticing rectal bleeding and weight loss.  He underwent colonoscopy earlier this year which showed a mid rectal mass approximately 7 cm from the anal verge extending to the rectosigmoid junction.  Biopsy showed adenocarcinoma.  The mass was tattooed distally.  CT scans of the chest, abdomen and pelvis show no signs of metastatic disease, but locally advanced rectal tumor.   MRI showed a T4bN2 lesion invading the left pelvic sidewall and left seminal vesicle.  He has completed total neoadjuvant chemotherapy, with his last radiation dose on 8/25.  Post procedure MRI shows some response to therapy and some improvement in nodal disease.  There is decreased conspicuity of the left pelvic sidewall lymph node.  Past Medical History:  Diagnosis Date   Anemia    Rectal cancer ([place])    Stage III   Seasonal allergies    Past Surgical History:  Procedure Laterality Date   COLONOSCOPY     PORTACATH PLACEMENT Right 01/03/2021   Procedure: INSERTION PORT-A-CATH ULTRASOUND GUIDED THROUGH THE RIGHT IJ.;  Surgeon: Leighton Ruff, MD;  Location: [facility];  Service: General;  Laterality: Right;  ROOM 5 STARTING  AT 12:00PM FOR 63 MIN   TONSILLECTOMY     WRIST SURGERY Right    Family History  Problem Relation Age of Onset   Hypertension Mother    Heart attack Father    Cancer Sister    Stomach cancer Neg Hx    Colon cancer Neg Hx    Esophageal cancer Neg Hx    Pancreatic cancer Neg Hx    Rectal cancer Neg Hx    Social History   Socioeconomic History   Marital status: Divorced    Spouse name: Not on file   Number of children: Not on file    Years of education: Not on file   Highest education level: Not on file  Occupational History   Not on file  Tobacco Use   Smoking status: Never   Smokeless tobacco: Current    Types: Snuff, Chew  Vaping Use   Vaping Use: Never used  Substance and Sexual Activity   Alcohol use: Yes    Comment: daily beer    Drug use: No   Sexual activity: Yes    Birth control/protection: Condom  Other Topics Concern   Not on file  Social History Narrative   Not on file   Social Determinants of Health   Financial Resource Strain: Not on file  Food Insecurity: Not on file  Transportation Needs: Not on file  Physical Activity: Not on file  Stress: Not on file  Social Connections: Not on file  Intimate Partner Violence: Not on file   Outpatient Encounter Medications as of 07/01/2021  Medication Sig Note   acetaminophen (TYLENOL) 650 MG CR tablet Take 650 mg by mouth every 8 (eight) hours as needed for pain.         hydrocortisone (ANUSOL-HC) 25 MG suppository Place 1 suppository (25 mg total) rectally 2 (two) times daily.    lidocaine-prilocaine (EMLA) cream Apply to portacath site 1-2 hours prior to use 01/01/2021: Have not started  prochlorperazine (COMPAZINE) 10 MG tablet Take 1 tablet (10 mg total) by mouth every 6 (six) hours as needed for nausea or vomiting. 01/01/2021: Have not started   traMADol (ULTRAM) 50 MG tablet TAKE 1 TABLET(50 MG) BY MOUTH EVERY 12 HOURS AS NEEDED. DO NOT DRIVE WHILE TAKING TRAMADOL    No facility-administered encounter medications on file as of 07/01/2021.    No Known Allergies Review of Systems - General ROS: negative for - chills or fever Respiratory ROS: no cough, shortness of breath, or wheezing Cardiovascular ROS: no chest pain or dyspnea on exertion Gastrointestinal ROS: no abdominal pain, change in bowel habits, or black or bloody stools Genito-Urinary ROS: no dysuria, trouble voiding, or hematuria   Objective:    Vitals:   07/01/21 1045  BP: (!)  140/80  Pulse: 80  Temp: 36.7 C (98 F)  SpO2: 98%  Weight: 60.9 kg (134 lb 3.2 oz)  Height: 167.6 cm ('5\' 6"'$ )     Gen: NAD Abd: soft CV:RRR Lungs: CTA   Labs, Imaging and Diagnostic Testing:  CT scans of the chest, abdomen and pelvis show no signs of metastatic disease, but locally advanced rectal tumor.   Pre treatment MRI showed a T4bN2 lesion invading the left pelvic sidewall and left seminal vesicle.  Post treatment MRI shows some response to therapy and some improvement in nodal disease.  There is decreased conspicuity of the left pelvic sidewall lymph node.   Assessment and Plan:  Diagnoses and all orders for this visit:  Malignant neoplasm of rectum (CMS-HCC)    65 year old male with locally advanced mid rectal cancer.  He has completed total neoadjuvant chemotherapy and radiation.  He has had some response to treatment on follow-up MRI.  I would recommend low anterior resection with diverting ileostomy.  We have discussed the need for resection of the left pelvic sidewall and left seminal vesicle.  We have discussed implications of this including risk of damaging adjacent structures and pelvic nerves responsible for sexual function and bladder function.  We have discussed the need for identification of ureters using firefly catheter injections.  We will plan on doing a diverting ileostomy until the area heals.  The surgery and anatomy were described to the patient as well as the risks of surgery and the possible complications.  These include: Bleeding, deep abdominal infections and possible wound complications such as hernia and infection, damage to adjacent structures, leak of surgical connections, which can lead to other surgeries and possibly an ostomy, possible need for other procedures, such as abscess drains in radiology, possible prolonged hospital stay, possible diarrhea from removal of part of the colon, possible constipation from narcotics, possible bowel, bladder or  sexual dysfunction if having rectal surgery, prolonged fatigue/weakness or appetite loss, possible early recurrence of of disease, possible complications of their medical problems such as heart disease or arrhythmias or lung problems, death (less than 1%). I believe the patient understands and wishes to proceed with the surgery.

## 2021-07-02 ENCOUNTER — Other Ambulatory Visit: Payer: Self-pay | Admitting: Nurse Practitioner

## 2021-07-02 DIAGNOSIS — C20 Malignant neoplasm of rectum: Secondary | ICD-10-CM

## 2021-07-02 MED ORDER — OXYCODONE HCL 5 MG PO TABS: 5.0000 mg | ORAL_TABLET | Freq: Four times a day (QID) | ORAL | 0 refills | Status: DC | PRN

## 2021-07-07 DIAGNOSIS — Z20822 Contact with and (suspected) exposure to covid-19: Secondary | ICD-10-CM | POA: Diagnosis not present

## 2021-07-10 DIAGNOSIS — Z20822 Contact with and (suspected) exposure to covid-19: Secondary | ICD-10-CM | POA: Diagnosis not present

## 2021-07-14 ENCOUNTER — Ambulatory Visit
Admission: RE | Admit: 2021-07-14 | Discharge: 2021-07-14 | Disposition: A | Discharge status: Home / Self Care | Payer: Medicare HMO | Source: Ambulatory Visit | Attending: Radiation Oncology | Admitting: Radiation Oncology

## 2021-07-14 DIAGNOSIS — C20 Malignant neoplasm of rectum: Secondary | ICD-10-CM | POA: Insufficient documentation

## 2021-07-14 NOTE — Progress Notes (Signed)
  Radiation Oncology         [phone] ________________________________  Name: Phillip Brooks MRN: [id number]  Date of Service: 07/14/2021  DOB: March 20, 1956  Post Treatment Telephone Note  Diagnosis:   Stage IIIC, [phone], adenocarcinoma of the rectum  Interval Since Last Radiation:  5 weeks   05/05/2021 through 06/12/2021 Site Technique Total Dose (Gy) Dose per Fx (Gy) Completed Fx Beam Energies  Rectum: Rectum 3D 45/45 1.8 25/25 10X, 15X  Rectum: Rectum_Bst 3D 5.4/5.4 1.8 3/3 10X, 15X    Narrative:  The patient was contacted today for routine follow-up. During treatment he did very well with radiotherapy and did not have significant desquamation. He reports he is still having deep pelvic pain at night time and significant mucous production. He is not using suppositories right now but has the OTC hydrocortisone preparation H type suppositories. He was not able to afford the Anusol prescription.  Impression/Plan: 1. Stage IIIC, [id number], adenocarcinoma of the rectum. The patient has been doing fairly well and we discussed trying to use the OTC suppositories again to see if that makes any additional improvement in his symptoms. We discussed that we would be happy to continue to follow him as needed, but he will also continue to follow up with Dr. Benay Spice in medical oncology.      Carola Rhine, PAC

## 2021-07-21 ENCOUNTER — Other Ambulatory Visit: Payer: Self-pay | Admitting: Radiation Oncology

## 2021-07-21 DIAGNOSIS — C20 Malignant neoplasm of rectum: Secondary | ICD-10-CM

## 2021-07-23 ENCOUNTER — Encounter: Payer: Self-pay | Admitting: Oncology

## 2021-07-24 ENCOUNTER — Other Ambulatory Visit (HOSPITAL_COMMUNITY): Payer: Self-pay

## 2021-07-24 ENCOUNTER — Other Ambulatory Visit: Payer: Self-pay | Admitting: Nurse Practitioner

## 2021-07-24 DIAGNOSIS — Z1152 Encounter for screening for COVID-19: Secondary | ICD-10-CM | POA: Diagnosis not present

## 2021-07-24 DIAGNOSIS — C20 Malignant neoplasm of rectum: Secondary | ICD-10-CM

## 2021-07-24 MED ORDER — OXYCODONE HCL 5 MG PO TABS
5.0000 mg | ORAL_TABLET | Freq: Four times a day (QID) | ORAL | 0 refills | Status: DC | PRN
Start: 2021-07-24 — End: 2021-08-04

## 2021-07-24 NOTE — Patient Instructions (Addendum)
DUE TO COVID-19 ONLY ONE VISITOR IS ALLOWED TO COME WITH YOU AND STAY IN THE WAITING ROOM ONLY DURING PRE OP AND PROCEDURE.   **NO VISITORS ARE ALLOWED IN THE SHORT STAY AREA OR RECOVERY ROOM!!**  IF YOU WILL BE ADMITTED INTO THE HOSPITAL YOU ARE ALLOWED ONLY TWO SUPPORT PEOPLE DURING VISITATION HOURS ONLY (7 AM -8PM)    Up to two visitors ages 35+ are allowed at one in a patient's room.  The visitors may rotate out with other people throughout the day.  Additionally, up to two children between the ages of 40 and 49 are allowed and do not count toward the number of allowed visitors.  Children within this age range must be accompanied by an adult visitor.  One adult visitor may remain with the patient overnight and must be in the room by 8 PM.  COVID SWAB TESTING MUST BE COMPLETED ON:  08-13-21, Between the hours of 8 and 3  **MUST PRESENT COMPLETED FORM AT TESTING SITE**    Phillip Brooks (backside of the building)  You are not required to quarantine, however you are required to wear a well-fitted mask when you are out and around people not in your household.  Hand Hygiene often Do NOT share personal items Notify your provider if you are in close contact with someone who has COVID or you develop fever 100.4 or greater, new onset of sneezing, cough, sore throat, shortness of breath or body aches.        Your procedure is scheduled on:  Friday, 08/16/2021   Report to [facility] Main  Entrance   Report to Short Stay at 5:15 AM   [facility])   Call this number if you have problems the morning of surgery [phone]   Follow a clear liquid diet day of prep to avoid dehydration.   Follow prep instructions from surgeon's office   Do not eat food :After Midnight.   May have liquids until 4:30 AM day of surgery  CLEAR LIQUID DIET  Foods Allowed                                                                     Foods Excluded  Water, Black Coffee (no  milk/no creamer) and tea, regular and decaf                              liquids that you cannot  Plain Jell-O in any flavor  (No red)                         see through such as: Fruit ices (not with fruit pulp)                                 milk, soups, orange juice  Iced Popsicles (No red)                                    All solid food  Apple juices Sports drinks like Gatorade (No red) Lightly seasoned clear broth or consume(fat free) Sugar Sample Menu Breakfast                                Lunch                                     Supper Cranberry juice                    Beef broth                            Chicken broth Jell-O                                     Grape juice                           Apple juice Coffee or tea                        Jell-O                                      Popsicle                                                Coffee or tea                        Coffee or tea       Drink 2 Ensure drinks the night before surgery, complete by 10 PM.  Complete one Ensure drink the morning of surgery 3 hours prior to scheduled surgery by 4:30 AM.     The day of surgery:  Drink ONE (1) Pre-Surgery Clear Ensure or G2 by am the morning of surgery. Drink in one sitting. Do not sip.  This drink was given to you during your hospital  pre-op appointment visit. Nothing else to drink after completing the  Pre-Surgery Clear Ensure or G2.          If you have questions, please contact your surgeon's office.     Oral Hygiene is also important to reduce your risk of infection.                                    Remember - BRUSH YOUR TEETH THE MORNING OF SURGERY WITH YOUR REGULAR TOOTHPASTE   Do NOT smoke after Midnight   Take these medicines the morning of surgery with A SIP OF WATER:  Acetaminophen, Oxycodone   Stop all vitamins and herbal supplements a week before surgery             You may not have any metal on your body  including jewelry, and body piercing             Do not wear  lotions, powders, cologne, or deodorant              Men may shave face and neck.  Do not bring valuables to the hospital. [place].   Contacts, dentures or bridgework may not be worn into surgery.   Bring small overnight bag day of surgery.   Please read over the following fact sheets you were given: IF YOU HAVE QUESTIONS ABOUT YOUR PRE OP INSTRUCTIONS PLEASE CALL [place] - Preparing for Surgery Before surgery, you can play an important role.  Because skin is not sterile, your skin needs to be as free of germs as possible.  You can reduce the number of germs on your skin by washing with CHG (chlorahexidine gluconate) soap before surgery.  CHG is an antiseptic cleaner which kills germs and bonds with the skin to continue killing germs even after washing. Please DO NOT use if you have an allergy to CHG or antibacterial soaps.  If your skin becomes reddened/irritated stop using the CHG and inform your nurse when you arrive at Short Stay. Do not shave (including legs and underarms) for at least 48 hours prior to the first CHG shower.  You may shave your face/neck.  Please follow these instructions carefully:  1.  Shower with CHG Soap the night before surgery and the  morning of surgery.  2.  If you choose to wash your hair, wash your hair first as usual with your normal  shampoo.  3.  After you shampoo, rinse your hair and body thoroughly to remove the shampoo.                             4.  Use CHG as you would any other liquid soap.  You can apply chg directly to the skin and wash.  Gently with a scrungie or clean washcloth.  5.  Apply the CHG Soap to your body ONLY FROM THE NECK DOWN.   Do   not use on face/ open                           Wound or open sores. Avoid contact with eyes, ears mouth and   genitals (private parts).                       Wash face,  Genitals  (private parts) with your normal soap.             6.  Wash thoroughly, paying special attention to the area where your    surgery  will be performed.  7.  Thoroughly rinse your body with warm water from the neck down.  8.  DO NOT shower/wash with your normal soap after using and rinsing off the CHG Soap.                9.  Pat yourself dry with a clean towel.            10.  Wear clean pajamas.            11.  Place clean sheets on your bed the night of your first shower and do not  sleep with pets. Day of Surgery : Do not apply any lotions/deodorants the morning of surgery.  Please wear clean clothes to the hospital/surgery center.  FAILURE TO FOLLOW THESE INSTRUCTIONS  MAY RESULT IN THE CANCELLATION OF YOUR SURGERY  PATIENT SIGNATURE_________________________________  NURSE SIGNATURE__________________________________  ________________________________________________________________________   Phillip Brooks  An incentive spirometer is a tool that can help keep your lungs clear and active. This tool measures how well you are filling your lungs with each breath. Taking long deep breaths may help reverse or decrease the chance of developing breathing (pulmonary) problems (especially infection) following: A long period of time when you are unable to move or be active. BEFORE THE PROCEDURE  If the spirometer includes an indicator to show your best effort, your nurse or respiratory therapist will set it to a desired goal. If possible, sit up straight or lean slightly forward. Try not to slouch. Hold the incentive spirometer in an upright position. INSTRUCTIONS FOR USE  Sit on the edge of your bed if possible, or sit up as far as you can in bed or on a chair. Hold the incentive spirometer in an upright position. Breathe out normally. Place the mouthpiece in your mouth and seal your lips tightly around it. Breathe in slowly and as deeply as possible, raising the piston or the ball toward  the top of the column. Hold your breath for 3-5 seconds or for as long as possible. Allow the piston or ball to fall to the bottom of the column. Remove the mouthpiece from your mouth and breathe out normally. Rest for a few seconds and repeat Steps 1 through 7 at least 10 times every 1-2 hours when you are awake. Take your time and take a few normal breaths between deep breaths. The spirometer may include an indicator to show your best effort. Use the indicator as a goal to work toward during each repetition. After each set of 10 deep breaths, practice coughing to be sure your lungs are clear. If you have an incision (the cut made at the time of surgery), support your incision when coughing by placing a pillow or rolled up towels firmly against it. Once you are able to get out of bed, walk around indoors and cough well. You may stop using the incentive spirometer when instructed by your caregiver.  RISKS AND COMPLICATIONS Take your time so you do not get dizzy or light-headed. If you are in pain, you may need to take or ask for pain medication before doing incentive spirometry. It is harder to take a deep breath if you are having pain. AFTER USE Rest and breathe slowly and easily. It can be helpful to keep track of a log of your progress. Your caregiver can provide you with a simple table to help with this. If you are using the spirometer at home, follow these instructions: [place] IF:  You are having difficultly using the spirometer. You have trouble using the spirometer as often as instructed. Your pain medication is not giving enough relief while using the spirometer. You develop fever of 100.5 F (38.1 C) or higher. SEEK IMMEDIATE MEDICAL CARE IF:  You cough up bloody sputum that had not been present before. You develop fever of 102 F (38.9 C) or greater. You develop worsening pain at or near the incision site. MAKE SURE YOU:  Understand these instructions. Will watch your  condition. Will get help right away if you are not doing well or get worse. Document Released: 02/15/2007 Document Revised: 12/28/2011 Document Reviewed: 04/18/2007 [employer] Patient Information 2014 [employer], [place].   ________________________________________________________________________  WHAT IS A BLOOD TRANSFUSION? Blood Transfusion Information  A transfusion is the replacement of blood  or some of its parts. Blood is made up of multiple cells which provide different functions. Red blood cells carry oxygen and are used for blood loss replacement. White blood cells fight against infection. Platelets control bleeding. Plasma helps clot blood. Other blood products are available for specialized needs, such as hemophilia or other clotting disorders. BEFORE THE TRANSFUSION  Who gives blood for transfusions?  Healthy volunteers who are fully evaluated to make sure their blood is safe. This is blood bank blood. Transfusion therapy is the safest it has ever been in the practice of medicine. Before blood is taken from a donor, a complete history is taken to make sure that person has no history of diseases nor engages in risky social behavior (examples are intravenous drug use or sexual activity with multiple partners). The donor's travel history is screened to minimize risk of transmitting infections, such as malaria. The donated blood is tested for signs of infectious diseases, such as HIV and hepatitis. The blood is then tested to be sure it is compatible with you in order to minimize the chance of a transfusion reaction. If you or a relative donates blood, this is often done in anticipation of surgery and is not appropriate for emergency situations. It takes many days to process the donated blood. RISKS AND COMPLICATIONS Although transfusion therapy is very safe and saves many lives, the main dangers of transfusion include:  Getting an infectious disease. Developing a transfusion reaction. This is  an allergic reaction to something in the blood you were given. Every precaution is taken to prevent this. The decision to have a blood transfusion has been considered carefully by your caregiver before blood is given. Blood is not given unless the benefits outweigh the risks. AFTER THE TRANSFUSION Right after receiving a blood transfusion, you will usually feel much better and more energetic. This is especially true if your red blood cells have gotten low (anemic). The transfusion raises the level of the red blood cells which carry oxygen, and this usually causes an energy increase. The nurse administering the transfusion will monitor you carefully for complications. HOME CARE INSTRUCTIONS  No special instructions are needed after a transfusion. You may find your energy is better. Speak with your caregiver about any limitations on activity for underlying diseases you may have. SEEK MEDICAL CARE IF:  Your condition is not improving after your transfusion. You develop redness or irritation at the intravenous (IV) site. SEEK IMMEDIATE MEDICAL CARE IF:  Any of the following symptoms occur over the next 12 hours: Shaking chills. You have a temperature by mouth above 102 F (38.9 C), not controlled by medicine. Chest, back, or muscle pain. People around you feel you are not acting correctly or are confused. Shortness of breath or difficulty breathing. Dizziness and fainting. You get a rash or develop hives. You have a decrease in urine output. Your urine turns a dark color or changes to pink, red, or brown. Any of the following symptoms occur over the next 10 days: You have a temperature by mouth above 102 F (38.9 C), not controlled by medicine. Shortness of breath. Weakness after normal activity. The white part of the eye turns yellow (jaundice). You have a decrease in the amount of urine or are urinating less often. Your urine turns a dark color or changes to pink, red, or brown. Document  Released: 10/02/2000 Document Revised: 12/28/2011 Document Reviewed: 05/21/2008 [facility] Patient Information 2014 [place], [place].  _______________________________________________________________________

## 2021-07-25 ENCOUNTER — Other Ambulatory Visit: Payer: Self-pay | Admitting: Urology

## 2021-07-28 ENCOUNTER — Inpatient Hospital Stay: Payer: Medicare HMO | Attending: Oncology

## 2021-07-28 ENCOUNTER — Inpatient Hospital Stay (HOSPITAL_BASED_OUTPATIENT_CLINIC_OR_DEPARTMENT_OTHER): Payer: Medicare HMO | Admitting: Oncology

## 2021-07-28 ENCOUNTER — Inpatient Hospital Stay: Payer: Medicare HMO

## 2021-07-28 ENCOUNTER — Other Ambulatory Visit: Payer: Self-pay

## 2021-07-28 VITALS — BP 131/91 | HR 82 | Temp 97.8°F | Resp 18 | Ht 66.0 in | Wt 133.2 lb

## 2021-07-28 DIAGNOSIS — R152 Fecal urgency: Secondary | ICD-10-CM | POA: Diagnosis not present

## 2021-07-28 DIAGNOSIS — C20 Malignant neoplasm of rectum: Secondary | ICD-10-CM | POA: Insufficient documentation

## 2021-07-28 DIAGNOSIS — Z9221 Personal history of antineoplastic chemotherapy: Secondary | ICD-10-CM | POA: Diagnosis not present

## 2021-07-28 DIAGNOSIS — Z95828 Presence of other vascular implants and grafts: Secondary | ICD-10-CM

## 2021-07-28 DIAGNOSIS — Z923 Personal history of irradiation: Secondary | ICD-10-CM | POA: Insufficient documentation

## 2021-07-28 LAB — CBC WITH DIFFERENTIAL (CANCER CENTER ONLY)
Abs Immature Granulocytes: 0.01 10*3/uL (ref 0.00–0.07)
Basophils Absolute: 0 10*3/uL (ref 0.0–0.1)
Basophils Relative: 0 %
Eosinophils Absolute: 0.5 10*3/uL (ref 0.0–0.5)
Eosinophils Relative: 10 %
HCT: 32.3 % — ABNORMAL LOW (ref 39.0–52.0)
Hemoglobin: 10.2 g/dL — ABNORMAL LOW (ref 13.0–17.0)
Immature Granulocytes: 0 %
Lymphocytes Relative: 20 %
Lymphs Abs: 1 10*3/uL (ref 0.7–4.0)
MCH: 29 pg (ref 26.0–34.0)
MCHC: 31.6 g/dL (ref 30.0–36.0)
MCV: 91.8 fL (ref 80.0–100.0)
Monocytes Absolute: 0.5 10*3/uL (ref 0.1–1.0)
Monocytes Relative: 9 %
Neutro Abs: 3.2 10*3/uL (ref 1.7–7.7)
Neutrophils Relative %: 61 %
Platelet Count: 384 10*3/uL (ref 150–400)
RBC: 3.52 MIL/uL — ABNORMAL LOW (ref 4.22–5.81)
RDW: 14.3 % (ref 11.5–15.5)
WBC Count: 5.2 10*3/uL (ref 4.0–10.5)
nRBC: 0 % (ref 0.0–0.2)

## 2021-07-28 LAB — CEA (ACCESS): CEA (CHCC): 1.76 ng/mL (ref 0.00–5.00)

## 2021-07-28 MED ORDER — HEPARIN SOD (PORK) LOCK FLUSH 100 UNIT/ML IV SOLN
500.0000 [IU] | Freq: Once | INTRAVENOUS | Status: AC
  Administered 2021-07-28: 500 [IU]

## 2021-07-28 MED ORDER — SODIUM CHLORIDE 0.9% FLUSH
10.0000 mL | Freq: Once | INTRAVENOUS | Status: AC
  Administered 2021-07-28: 10 mL

## 2021-07-28 NOTE — Patient Instructions (Signed)
Implanted Port Home Guide An implanted port is a device that is placed under the skin. It is usually placed in the chest. The device can be used to give IV medicine, to take blood, or for dialysis. You may have an implanted port if: You need IV medicine that would be irritating to the small veins in your hands or arms. You need IV medicines, such as antibiotics, for a long period of time. You need IV nutrition for a long period of time. You need dialysis. When you have a port, your health care provider can choose to use the port instead of veins in your arms for these procedures. You may have fewer limitations when using a port than you would if you used other types of long-term IVs, and you will likely be able to return to normal activities after your incision heals. An implanted port has two main parts: Reservoir. The reservoir is the part where a needle is inserted to give medicines or draw blood. The reservoir is round. After it is placed, it appears as a small, raised area under your skin. Catheter. The catheter is a thin, flexible tube that connects the reservoir to a vein. Medicine that is inserted into the reservoir goes into the catheter and then into the vein. How is my port accessed? To access your port: A numbing cream may be placed on the skin over the port site. Your health care provider will put on a mask and sterile gloves. The skin over your port will be cleaned carefully with a germ-killing soap and allowed to dry. Your health care provider will gently pinch the port and insert a needle into it. Your health care provider will check for a blood return to make sure the port is in the vein and is not clogged. If your port needs to remain accessed to get medicine continuously (constant infusion), your health care provider will place a clear bandage (dressing) over the needle site. The dressing and needle will need to be changed every week, or as told by your health care provider. What  is flushing? Flushing helps keep the port from getting clogged. Follow instructions from your health care provider about how and when to flush the port. Ports are usually flushed with saline solution or a medicine called heparin. The need for flushing will depend on how the port is used: If the port is only used from time to time to give medicines or draw blood, the port may need to be flushed: Before and after medicines have been given. Before and after blood has been drawn. As part of routine maintenance. Flushing may be recommended every 4-6 weeks. If a constant infusion is running, the port may not need to be flushed. Throw away any syringes in a disposal container that is meant for sharp items (sharps container). You can buy a sharps container from a pharmacy, or you can make one by using an empty hard plastic bottle with a cover. How long will my port stay implanted? The port can stay in for as long as your health care provider thinks it is needed. When it is time for the port to come out, a surgery will be done to remove it. The surgery will be similar to the procedure that was done to put the port in. Follow these instructions at home:  Flush your port as told by your health care provider. If you need an infusion over several days, follow instructions from your health care provider about how   to take care of your port site. Make sure you: Wash your hands with soap and water before you change your dressing. If soap and water are not available, use alcohol-based hand sanitizer. Change your dressing as told by your health care provider. Place any used dressings or infusion bags into a plastic bag. Throw that bag in the trash. Keep the dressing that covers the needle clean and dry. Do not get it wet. Do not use scissors or sharp objects near the tube. Keep the tube clamped, unless it is being used. Check your port site every day for signs of infection. Check for: Redness, swelling, or  pain. Fluid or blood. Pus or a bad smell. Protect the skin around the port site. Avoid wearing bra straps that rub or irritate the site. Protect the skin around your port from seat belts. Place a soft pad over your chest if needed. Bathe or shower as told by your health care provider. The site may get wet as long as you are not actively receiving an infusion. Return to your normal activities as told by your health care provider. Ask your health care provider what activities are safe for you. Carry a medical alert card or wear a medical alert bracelet at all times. This will let health care providers know that you have an implanted port in case of an emergency. Get help right away if: You have redness, swelling, or pain at the port site. You have fluid or blood coming from your port site. You have pus or a bad smell coming from the port site. You have a fever. Summary Implanted ports are usually placed in the chest for long-term IV access. Follow instructions from your health care provider about flushing the port and changing bandages (dressings). Take care of the area around your port by avoiding clothing that puts pressure on the area, and by watching for signs of infection. Protect the skin around your port from seat belts. Place a soft pad over your chest if needed. Get help right away if you have a fever or you have redness, swelling, pain, drainage, or a bad smell at the port site. This information is not intended to replace advice given to you by your health care provider. Make sure you discuss any questions you have with your health care provider. Document Revised: 12/25/2020 Document Reviewed: 02/19/2020 Elsevier Patient Education  2022 Elsevier Inc.  

## 2021-07-28 NOTE — Progress Notes (Signed)
[facility] OFFICE PROGRESS NOTE   Diagnosis: Rectal cancer  INTERVAL HISTORY:   Phillip Brooks returns as scheduled.  He continues to have rectal urgency.  He has pain in the "bladder and testicles "when he urinates.  No burning.  No rectal bleeding.  He passes mucus per rectum.  He saw Dr. Marcello Moores and is scheduled for surgery 08/07/2021.   Objective:  Vital signs in last 24 hours:  Blood pressure (!) 131/91, pulse 82, temperature 97.8 F (36.6 C), temperature source Oral, resp. rate 18, height 5\' 6"  (1.676 m), weight 133 lb 3.2 oz (60.4 kg), SpO2 100 %.    Lymphatics: "Shotty "bilateral inguinal nodes Resp: Lungs clear bilaterally Cardio: Regular rate and rhythm GI: No mass, no hepatosplenomegaly, nontender Vascular: No leg edema Rectal: Mild radiation hyperpigmentation at the perineum, no skin breakdown  Portacath/PICC-without erythema  Lab Results:  Lab Results  Component Value Date   WBC 5.2 07/28/2021   HGB 10.2 (L) 07/28/2021   HCT 32.3 (L) 07/28/2021   MCV 91.8 07/28/2021   PLT 384 07/28/2021   NEUTROABS 3.2 07/28/2021    CMP  Lab Results  Component Value Date   NA 137 06/09/2021   K 3.9 06/09/2021   CL 103 06/09/2021   CO2 25 06/09/2021   GLUCOSE 81 06/09/2021   BUN 5 (L) 06/09/2021   CREATININE 0.79 06/09/2021   CALCIUM 9.4 06/09/2021   PROT 7.7 06/09/2021   ALBUMIN 3.8 06/09/2021   AST 11 (L) 06/09/2021   ALT 5 06/09/2021   ALKPHOS 55 06/09/2021   BILITOT 0.3 06/09/2021   GFRNONAA >60 06/09/2021   GFRAA >60 06/29/2020    Lab Results  Component Value Date   CEA1 6.98 (H) 05/07/2021   CEA 1.76 07/28/2021   Medications: I have reviewed the patient's current medications.   Assessment/Plan:  Rectal cancer Colonoscopy 12/06/2020-partially obstructing mass beginning at 7 cm from the anal verge-biopsy adenocarcinoma CTs 12/10/2020-large circumferential fungating mass at the rectosigmoid junction beginning at 9 cm from the anal verge,  enlarged perirectal lymph nodes, prominent bilateral iliac and pelvic sidewall nodes-new compared to a CT from 2018, no evidence of metastatic disease to the chest, pulmonary nodules at the right apex-nonspecific MRI pelvis 12/24/2020-T4b, N2; distance from tumor to the internal anal sphincter 7.3 cm; findings of potential contained perforation with tract seen potentially extending through the tumor into the mesorectum. Plan for total neoadjuvant therapy Cycle 1 FOLFOX 01/07/2021 Cycle 2 FOLFOX 01/22/2021 Cycle 3 FOLFOX 02/06/2021 Cycle 4 FOLFOX 02/19/2021 (oxaliplatin dose reduced to 65 mg per metered squared due to mild neutropenia) Cycle 5 FOLFOX 03/05/2021, Udenyca Cycle 6 FOLFOX 03/19/2021, Udenyca Cycle 7 FOLFOX 04/02/2021, Udenyca Cycle 8 FOLFOX 04/16/2021, Udenyca Radiation/Xeloda 05/05/2021-06/12/2021 MRI pelvis 06/26/2021-tumor at 9 cm from the anal verge, decreased tumor bulk, extra rectal involvement at the left seminal vesicle and anterior peritoneal reflection, tumor involves the fascia at the anterior sacrum and mesorectum T4b.  Decrease in size of pelvic lymph nodes , High inferior mesenteric vein node not evaluated Pain secondary #1-improved Weight loss-improved Port-A-Cath placement, Dr. 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  He is scheduled for surgery on 08/06/2021.  The restaging pelvic MRI reveals a decrease in the size of the rectal tumor and pelvic lymph nodes.  He continues to have rectal urgency and dysuria.  I suspect his symptoms are related to the tumor and  radiation toxicity.  We will check a urinalysis and culture today.  He will continue oxycodone as needed for pain.  Phillip Brooks will return for an office visit 2-3 weeks following the low anterior resection.  Phillip Coder, MD  07/28/2021  12:49 PM

## 2021-07-29 ENCOUNTER — Other Ambulatory Visit: Payer: Self-pay

## 2021-07-29 ENCOUNTER — Encounter (HOSPITAL_COMMUNITY): Payer: Self-pay

## 2021-07-29 ENCOUNTER — Encounter (HOSPITAL_COMMUNITY)
Admission: RE | Admit: 2021-07-29 | Discharge: 2021-07-29 | Disposition: A | Discharge status: Home / Self Care | Payer: Medicare HMO | Source: Ambulatory Visit | Attending: General Surgery | Admitting: General Surgery

## 2021-07-29 DIAGNOSIS — Z01818 Encounter for other preprocedural examination: Secondary | ICD-10-CM | POA: Insufficient documentation

## 2021-07-29 LAB — HEMOGLOBIN A1C
Hgb A1c MFr Bld: 4.6 % — ABNORMAL LOW (ref 4.8–5.6)
Mean Plasma Glucose: 85.32 mg/dL

## 2021-07-29 LAB — CEA (IN HOUSE-CHCC): CEA (CHCC-In House): 1.94 ng/mL (ref 0.00–5.00)

## 2021-07-29 NOTE — Consult Note (Signed)
[facility] Nurse ostomy consult note  [facility] Nurse requested for preoperative stoma site marking for an ileostomy per Dr. Marcello Moores' request.  Discussed surgical procedure and stoma creation with patient.  Explained role of the [facility] nurse team.  Provided samples of pouching options and demonstrated their use.  Answered patient questions. He has a rapid speech pattern and uses humor to deflect his anxiety about the procedure.  Examined patient sitting and standing in order to place the marking in the patient's visual field, away from any creases or abdominal contour issues and within the rectus muscle. There is a deep crease 3cm below the marking and patient endorses he has lost a "good bit" of weight.  Patient is mildly distended today. He is incontinent of stool and is wearing and adult brief. He endorses urgency. 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 He reports that he has Medicare, Medicaid and [employer].  Marked for ileostomy in the RUQ  5cm to the right of the umbilicus and  3cm above the umbilicus.   Patient's abdomen cleansed with CHG wipes at site markings, allowed to air dry prior to marking.Covered mark with thin film transparent dressing to preserve mark until date of surgery, Friday, August 15, 2021.   [facility] Nurse team will follow up with patient after surgery for continue ostomy care and teaching.   Thank you for allowing [place] to meet and mark this patient preoperatively. He expressed his appreciation for the preoperative teaching session today.  Maudie Flakes, MSN, RN, [place], [facility]  Pager# [phone]

## 2021-07-29 NOTE — Progress Notes (Signed)
COVID swab appointment:  08-13-21  COVID Vaccine Completed:  Yes  Date COVID Vaccine completed: Has received booster: COVID vaccine manufacturer: [employer]   Date of COVID positive in last 90 days:  N/A  PCP - No PCP Cardiologist - N/A  Chest x-ray - 01-03-21 Epic EKG - N/A Stress Test - N/A ECHO - N/A Cardiac Cath - N/A Pacemaker/ICD device last checked: Spinal Cord Stimulator:  Sleep Study - N/A CPAP -   Fasting Blood Sugar - N/A Checks Blood Sugar _____ times a day  Blood Thinner Instructions:N/A Aspirin Instructions: Last Dose:  Activity level:  Can go up a flight of stairs and perform activities of daily living without stopping and without symptoms of chest pain or shortness of breath.    Anesthesia review: N/A  Patient denies shortness of breath, fever, cough and chest pain at PAT appointment   Patient verbalized understanding of instructions that were given to them at the PAT appointment. Patient was also instructed that they will need to review over the PAT instructions again at home before surgery.

## 2021-07-29 NOTE — Progress Notes (Addendum)
Patient in for presurgical appointment and stated that he did not have bowel prep instructions for surgery.  He had the medicines from the pharmacy.  Called and received instructions via fax from surgeon's office and instructions reviewed with patient.

## 2021-08-04 ENCOUNTER — Other Ambulatory Visit: Payer: Self-pay | Admitting: Nurse Practitioner

## 2021-08-04 ENCOUNTER — Telehealth: Payer: Self-pay | Admitting: *Deleted

## 2021-08-04 DIAGNOSIS — C20 Malignant neoplasm of rectum: Secondary | ICD-10-CM

## 2021-08-04 MED ORDER — OXYCODONE HCL 5 MG PO TABS
5.0000 mg | ORAL_TABLET | Freq: Four times a day (QID) | ORAL | 0 refills | Status: DC | PRN
Start: 2021-08-04 — End: 2021-08-11

## 2021-08-04 NOTE — Telephone Encounter (Signed)
Patient left VM requesting refill on oxycodone. NP notified.

## 2021-08-11 ENCOUNTER — Other Ambulatory Visit: Payer: Self-pay | Admitting: Nurse Practitioner

## 2021-08-11 ENCOUNTER — Telehealth: Payer: Self-pay | Admitting: *Deleted

## 2021-08-11 DIAGNOSIS — C20 Malignant neoplasm of rectum: Secondary | ICD-10-CM

## 2021-08-11 MED ORDER — OXYCODONE HCL 5 MG PO TABS: 5.0000 mg | ORAL_TABLET | Freq: Four times a day (QID) | ORAL | 0 refills | Status: AC | PRN

## 2021-08-11 NOTE — Telephone Encounter (Signed)
Patient reached out to clinic calling "doctor's line" requesting refill on oxycodone. Reports taking it every 6 hours for abdominal pain rated "25". Only #1 tablet on hand. Surgery scheduled for 08/04/2021. Forwarded request to NP.

## 2021-08-13 ENCOUNTER — Other Ambulatory Visit: Payer: Self-pay | Admitting: General Surgery

## 2021-08-13 LAB — SARS CORONAVIRUS 2 (TAT 6-24 HRS): SARS Coronavirus 2: NEGATIVE

## 2021-08-15 ENCOUNTER — Inpatient Hospital Stay (HOSPITAL_COMMUNITY): Payer: Medicare HMO | Admitting: Certified Registered"

## 2021-08-15 ENCOUNTER — Encounter (HOSPITAL_COMMUNITY): Admission: RE | Disposition: E | Payer: Self-pay | Source: Home / Self Care | Attending: General Surgery

## 2021-08-15 ENCOUNTER — Other Ambulatory Visit: Payer: Self-pay

## 2021-08-15 ENCOUNTER — Encounter (HOSPITAL_COMMUNITY): Payer: Self-pay | Admitting: General Surgery

## 2021-08-15 ENCOUNTER — Inpatient Hospital Stay (HOSPITAL_COMMUNITY)
Admission: RE | Admit: 2021-08-15 | Discharge: 2021-11-19 | DRG: 329 | Disposition: E | Payer: Medicare HMO | Attending: General Surgery | Admitting: General Surgery

## 2021-08-15 DIAGNOSIS — Z4682 Encounter for fitting and adjustment of non-vascular catheter: Secondary | ICD-10-CM | POA: Diagnosis not present

## 2021-08-15 DIAGNOSIS — Z8249 Family history of ischemic heart disease and other diseases of the circulatory system: Secondary | ICD-10-CM

## 2021-08-15 DIAGNOSIS — K5939 Other megacolon: Secondary | ICD-10-CM | POA: Diagnosis not present

## 2021-08-15 DIAGNOSIS — Z978 Presence of other specified devices: Secondary | ICD-10-CM

## 2021-08-15 DIAGNOSIS — L0291 Cutaneous abscess, unspecified: Secondary | ICD-10-CM

## 2021-08-15 DIAGNOSIS — D127 Benign neoplasm of rectosigmoid junction: Secondary | ICD-10-CM | POA: Diagnosis not present

## 2021-08-15 DIAGNOSIS — K567 Ileus, unspecified: Secondary | ICD-10-CM | POA: Diagnosis not present

## 2021-08-15 DIAGNOSIS — E43 Unspecified severe protein-calorie malnutrition: Secondary | ICD-10-CM | POA: Insufficient documentation

## 2021-08-15 DIAGNOSIS — K769 Liver disease, unspecified: Secondary | ICD-10-CM | POA: Diagnosis not present

## 2021-08-15 DIAGNOSIS — K6389 Other specified diseases of intestine: Secondary | ICD-10-CM | POA: Diagnosis not present

## 2021-08-15 DIAGNOSIS — Z932 Ileostomy status: Secondary | ICD-10-CM | POA: Diagnosis not present

## 2021-08-15 DIAGNOSIS — K5669 Other partial intestinal obstruction: Secondary | ICD-10-CM | POA: Diagnosis not present

## 2021-08-15 DIAGNOSIS — C20 Malignant neoplasm of rectum: Secondary | ICD-10-CM | POA: Diagnosis not present

## 2021-08-15 DIAGNOSIS — C189 Malignant neoplasm of colon, unspecified: Secondary | ICD-10-CM | POA: Diagnosis not present

## 2021-08-15 DIAGNOSIS — F1120 Opioid dependence, uncomplicated: Secondary | ICD-10-CM | POA: Diagnosis not present

## 2021-08-15 DIAGNOSIS — R110 Nausea: Secondary | ICD-10-CM | POA: Diagnosis not present

## 2021-08-15 DIAGNOSIS — R634 Abnormal weight loss: Secondary | ICD-10-CM | POA: Diagnosis not present

## 2021-08-15 DIAGNOSIS — R339 Retention of urine, unspecified: Secondary | ICD-10-CM | POA: Diagnosis not present

## 2021-08-15 DIAGNOSIS — G620 Drug-induced polyneuropathy: Secondary | ICD-10-CM | POA: Diagnosis not present

## 2021-08-15 DIAGNOSIS — N35812 Other urethral bulbous stricture, male: Secondary | ICD-10-CM | POA: Diagnosis not present

## 2021-08-15 DIAGNOSIS — G629 Polyneuropathy, unspecified: Secondary | ICD-10-CM | POA: Diagnosis not present

## 2021-08-15 DIAGNOSIS — B952 Enterococcus as the cause of diseases classified elsewhere: Secondary | ICD-10-CM | POA: Diagnosis not present

## 2021-08-15 DIAGNOSIS — C787 Secondary malignant neoplasm of liver and intrahepatic bile duct: Secondary | ICD-10-CM | POA: Diagnosis present

## 2021-08-15 DIAGNOSIS — K565 Intestinal adhesions [bands], unspecified as to partial versus complete obstruction: Secondary | ICD-10-CM | POA: Diagnosis not present

## 2021-08-15 DIAGNOSIS — Z0389 Encounter for observation for other suspected diseases and conditions ruled out: Secondary | ICD-10-CM | POA: Diagnosis not present

## 2021-08-15 DIAGNOSIS — Z0189 Encounter for other specified special examinations: Secondary | ICD-10-CM

## 2021-08-15 DIAGNOSIS — T8143XA Infection following a procedure, organ and space surgical site, initial encounter: Secondary | ICD-10-CM | POA: Diagnosis not present

## 2021-08-15 DIAGNOSIS — T451X5A Adverse effect of antineoplastic and immunosuppressive drugs, initial encounter: Secondary | ICD-10-CM | POA: Diagnosis present

## 2021-08-15 DIAGNOSIS — K56609 Unspecified intestinal obstruction, unspecified as to partial versus complete obstruction: Secondary | ICD-10-CM | POA: Diagnosis not present

## 2021-08-15 DIAGNOSIS — Z515 Encounter for palliative care: Secondary | ICD-10-CM

## 2021-08-15 DIAGNOSIS — K9189 Other postprocedural complications and disorders of digestive system: Secondary | ICD-10-CM | POA: Diagnosis not present

## 2021-08-15 DIAGNOSIS — N35912 Unspecified bulbous urethral stricture, male: Secondary | ICD-10-CM | POA: Diagnosis present

## 2021-08-15 DIAGNOSIS — Z66 Do not resuscitate: Secondary | ICD-10-CM | POA: Diagnosis not present

## 2021-08-15 DIAGNOSIS — K3189 Other diseases of stomach and duodenum: Secondary | ICD-10-CM | POA: Diagnosis not present

## 2021-08-15 DIAGNOSIS — T8132XA Disruption of internal operation (surgical) wound, not elsewhere classified, initial encounter: Secondary | ICD-10-CM | POA: Diagnosis not present

## 2021-08-15 DIAGNOSIS — B9620 Unspecified Escherichia coli [E. coli] as the cause of diseases classified elsewhere: Secondary | ICD-10-CM | POA: Diagnosis not present

## 2021-08-15 DIAGNOSIS — K660 Peritoneal adhesions (postprocedural) (postinfection): Secondary | ICD-10-CM | POA: Diagnosis not present

## 2021-08-15 DIAGNOSIS — Z4659 Encounter for fitting and adjustment of other gastrointestinal appliance and device: Secondary | ICD-10-CM | POA: Diagnosis not present

## 2021-08-15 DIAGNOSIS — Z20822 Contact with and (suspected) exposure to covid-19: Secondary | ICD-10-CM | POA: Diagnosis not present

## 2021-08-15 DIAGNOSIS — Z872 Personal history of diseases of the skin and subcutaneous tissue: Secondary | ICD-10-CM | POA: Diagnosis not present

## 2021-08-15 DIAGNOSIS — D638 Anemia in other chronic diseases classified elsewhere: Secondary | ICD-10-CM | POA: Diagnosis present

## 2021-08-15 DIAGNOSIS — D630 Anemia in neoplastic disease: Secondary | ICD-10-CM | POA: Diagnosis not present

## 2021-08-15 DIAGNOSIS — G893 Neoplasm related pain (acute) (chronic): Secondary | ICD-10-CM | POA: Diagnosis not present

## 2021-08-15 DIAGNOSIS — J302 Other seasonal allergic rhinitis: Secondary | ICD-10-CM | POA: Diagnosis not present

## 2021-08-15 DIAGNOSIS — Z5901 Sheltered homelessness: Secondary | ICD-10-CM

## 2021-08-15 DIAGNOSIS — Y832 Surgical operation with anastomosis, bypass or graft as the cause of abnormal reaction of the patient, or of later complication, without mention of misadventure at the time of the procedure: Secondary | ICD-10-CM | POA: Diagnosis not present

## 2021-08-15 DIAGNOSIS — Z6821 Body mass index (BMI) 21.0-21.9, adult: Secondary | ICD-10-CM

## 2021-08-15 DIAGNOSIS — E860 Dehydration: Secondary | ICD-10-CM | POA: Diagnosis not present

## 2021-08-15 DIAGNOSIS — K529 Noninfective gastroenteritis and colitis, unspecified: Secondary | ICD-10-CM | POA: Diagnosis not present

## 2021-08-15 DIAGNOSIS — K5651 Intestinal adhesions [bands], with partial obstruction: Secondary | ICD-10-CM | POA: Diagnosis not present

## 2021-08-15 DIAGNOSIS — R1110 Vomiting, unspecified: Secondary | ICD-10-CM | POA: Diagnosis not present

## 2021-08-15 DIAGNOSIS — C19 Malignant neoplasm of rectosigmoid junction: Secondary | ICD-10-CM | POA: Diagnosis not present

## 2021-08-15 DIAGNOSIS — K651 Peritoneal abscess: Secondary | ICD-10-CM | POA: Diagnosis not present

## 2021-08-15 DIAGNOSIS — T85628A Displacement of other specified internal prosthetic devices, implants and grafts, initial encounter: Secondary | ICD-10-CM | POA: Diagnosis not present

## 2021-08-15 DIAGNOSIS — Z9221 Personal history of antineoplastic chemotherapy: Secondary | ICD-10-CM | POA: Diagnosis not present

## 2021-08-15 DIAGNOSIS — Z809 Family history of malignant neoplasm, unspecified: Secondary | ICD-10-CM

## 2021-08-15 DIAGNOSIS — R140 Abdominal distension (gaseous): Secondary | ICD-10-CM

## 2021-08-15 DIAGNOSIS — K6811 Postprocedural retroperitoneal abscess: Secondary | ICD-10-CM | POA: Diagnosis not present

## 2021-08-15 DIAGNOSIS — Z923 Personal history of irradiation: Secondary | ICD-10-CM

## 2021-08-15 DIAGNOSIS — N1330 Unspecified hydronephrosis: Secondary | ICD-10-CM | POA: Diagnosis not present

## 2021-08-15 DIAGNOSIS — N35814 Other anterior urethral stricture, male: Secondary | ICD-10-CM | POA: Diagnosis not present

## 2021-08-15 DIAGNOSIS — N3289 Other specified disorders of bladder: Secondary | ICD-10-CM | POA: Diagnosis not present

## 2021-08-15 DIAGNOSIS — Z933 Colostomy status: Secondary | ICD-10-CM | POA: Diagnosis not present

## 2021-08-15 HISTORY — PX: DIVERTING ILEOSTOMY: SHX5799

## 2021-08-15 HISTORY — PX: XI ROBOTIC ASSISTED LOWER ANTERIOR RESECTION: SHX6558

## 2021-08-15 LAB — TYPE AND SCREEN
ABO/RH(D): A NEG
Antibody Screen: NEGATIVE

## 2021-08-15 LAB — ABO/RH: ABO/RH(D): A NEG

## 2021-08-15 SURGERY — RESECTION, RECTUM, LOW ANTERIOR, ROBOT-ASSISTED
Anesthesia: General | Site: Abdomen

## 2021-08-15 MED ORDER — SODIUM CHLORIDE 0.9 % IR SOLN
Status: DC | PRN
  Administered 2021-08-15: 1000 mL
  Administered 2021-08-15: 3000 mL

## 2021-08-15 MED ORDER — DIPHENHYDRAMINE HCL 12.5 MG/5ML PO ELIX
12.5000 mg | ORAL_SOLUTION | Freq: Four times a day (QID) | ORAL | Status: DC | PRN
  Administered 2021-08-21: 12.5 mg via ORAL
  Filled 2021-08-15: qty 5

## 2021-08-15 MED ORDER — ACETAMINOPHEN 160 MG/5ML PO SOLN: 325.0000 mg | ORAL | Status: DC | PRN

## 2021-08-15 MED ORDER — BUPIVACAINE-EPINEPHRINE (PF) 0.25% -1:200000 IJ SOLN
INTRAMUSCULAR | Status: AC
  Filled 2021-08-15: qty 30

## 2021-08-15 MED ORDER — PHENYLEPHRINE HCL-NACL 20-0.9 MG/250ML-% IV SOLN
INTRAVENOUS | Status: DC | PRN
  Administered 2021-08-15: 10 ug/min via INTRAVENOUS

## 2021-08-15 MED ORDER — ALVIMOPAN 12 MG PO CAPS
12.0000 mg | ORAL_CAPSULE | ORAL | Status: AC
  Administered 2021-08-15: 12 mg via ORAL
  Filled 2021-08-15: qty 1

## 2021-08-15 MED ORDER — ROCURONIUM BROMIDE 10 MG/ML (PF) SYRINGE
PREFILLED_SYRINGE | INTRAVENOUS | Status: AC
  Filled 2021-08-15: qty 10

## 2021-08-15 MED ORDER — AMISULPRIDE (ANTIEMETIC) 5 MG/2ML IV SOLN: 10.0000 mg | Freq: Once | INTRAVENOUS | Status: DC | PRN

## 2021-08-15 MED ORDER — CHLORHEXIDINE GLUCONATE 0.12 % MT SOLN
15.0000 mL | Freq: Once | OROMUCOSAL | Status: AC
  Administered 2021-08-15: 15 mL via OROMUCOSAL

## 2021-08-15 MED ORDER — HYDROMORPHONE HCL 1 MG/ML IJ SOLN
0.5000 mg | INTRAMUSCULAR | Status: DC | PRN
  Administered 2021-08-15 – 2021-08-16 (×4): 0.5 mg via INTRAVENOUS
  Filled 2021-08-15 (×4): qty 0.5

## 2021-08-15 MED ORDER — DEXMEDETOMIDINE (PRECEDEX) IN NS 20 MCG/5ML (4 MCG/ML) IV SYRINGE
PREFILLED_SYRINGE | INTRAVENOUS | Status: AC
  Filled 2021-08-15: qty 5

## 2021-08-15 MED ORDER — PROPOFOL 10 MG/ML IV BOLUS
INTRAVENOUS | Status: AC
  Filled 2021-08-15: qty 20

## 2021-08-15 MED ORDER — GABAPENTIN 300 MG PO CAPS
300.0000 mg | ORAL_CAPSULE | Freq: Two times a day (BID) | ORAL | Status: DC
  Administered 2021-08-16 – 2021-08-23 (×12): 300 mg via ORAL
  Filled 2021-08-15 (×18): qty 1

## 2021-08-15 MED ORDER — ORAL CARE MOUTH RINSE: 15.0000 mL | Freq: Once | OROMUCOSAL | Status: AC

## 2021-08-15 MED ORDER — KCL IN DEXTROSE-NACL 20-5-0.45 MEQ/L-%-% IV SOLN
INTRAVENOUS | Status: DC
  Filled 2021-08-15 (×2): qty 1000

## 2021-08-15 MED ORDER — ROCURONIUM BROMIDE 10 MG/ML (PF) SYRINGE
PREFILLED_SYRINGE | INTRAVENOUS | Status: DC | PRN
  Administered 2021-08-15: 20 mg via INTRAVENOUS
  Administered 2021-08-15: 10 mg via INTRAVENOUS
  Administered 2021-08-15: 20 mg via INTRAVENOUS
  Administered 2021-08-15: 60 mg via INTRAVENOUS
  Administered 2021-08-15: 20 mg via INTRAVENOUS

## 2021-08-15 MED ORDER — DEXAMETHASONE SODIUM PHOSPHATE 10 MG/ML IJ SOLN
INTRAMUSCULAR | Status: AC
  Filled 2021-08-15: qty 1

## 2021-08-15 MED ORDER — OXYCODONE HCL 5 MG/5ML PO SOLN
5.0000 mg | Freq: Once | ORAL | Status: AC | PRN
Start: 2021-08-15 — End: 2021-08-15

## 2021-08-15 MED ORDER — PROPOFOL 10 MG/ML IV BOLUS
INTRAVENOUS | Status: DC | PRN
  Administered 2021-08-15: 20 mg via INTRAVENOUS
  Administered 2021-08-15: 120 mg via INTRAVENOUS

## 2021-08-15 MED ORDER — SIMETHICONE 80 MG PO CHEW
40.0000 mg | CHEWABLE_TABLET | Freq: Four times a day (QID) | ORAL | Status: DC | PRN
  Administered 2021-08-21 – 2021-09-30 (×2): 40 mg via ORAL
  Filled 2021-08-15 (×3): qty 1

## 2021-08-15 MED ORDER — OXYCODONE HCL 5 MG PO TABS
ORAL_TABLET | ORAL | Status: AC
  Administered 2021-08-15: 5 mg via ORAL
  Filled 2021-08-15: qty 1

## 2021-08-15 MED ORDER — ACETAMINOPHEN 10 MG/ML IV SOLN: 1000.0000 mg | Freq: Once | INTRAVENOUS | Status: DC | PRN

## 2021-08-15 MED ORDER — LACTATED RINGERS IR SOLN
Status: DC | PRN
  Administered 2021-08-15: 1000 mL

## 2021-08-15 MED ORDER — FENTANYL CITRATE (PF) 250 MCG/5ML IJ SOLN
INTRAMUSCULAR | Status: AC
  Filled 2021-08-15: qty 5

## 2021-08-15 MED ORDER — STERILE WATER FOR INJECTION IJ SOLN
INTRAMUSCULAR | Status: DC | PRN
  Administered 2021-08-15: 15 mL via INTRAMUSCULAR

## 2021-08-15 MED ORDER — FENTANYL CITRATE PF 50 MCG/ML IJ SOSY
PREFILLED_SYRINGE | INTRAMUSCULAR | Status: AC
  Administered 2021-08-15: 50 ug via INTRAVENOUS
  Filled 2021-08-15: qty 1

## 2021-08-15 MED ORDER — PROMETHAZINE HCL 25 MG/ML IJ SOLN: 6.2500 mg | INTRAMUSCULAR | Status: DC | PRN

## 2021-08-15 MED ORDER — ENOXAPARIN SODIUM 40 MG/0.4ML IJ SOSY
40.0000 mg | PREFILLED_SYRINGE | INTRAMUSCULAR | Status: DC
  Administered 2021-08-16 – 2021-09-16 (×32): 40 mg via SUBCUTANEOUS
  Filled 2021-08-15 (×32): qty 0.4

## 2021-08-15 MED ORDER — ENSURE PRE-SURGERY PO LIQD
296.0000 mL | Freq: Once | ORAL | Status: DC
  Filled 2021-08-15: qty 296

## 2021-08-15 MED ORDER — ONDANSETRON HCL 4 MG/2ML IJ SOLN
4.0000 mg | Freq: Four times a day (QID) | INTRAMUSCULAR | Status: DC | PRN
  Administered 2021-08-16 – 2021-09-30 (×50): 4 mg via INTRAVENOUS
  Filled 2021-08-15 (×54): qty 2

## 2021-08-15 MED ORDER — SACCHAROMYCES BOULARDII 250 MG PO CAPS
250.0000 mg | ORAL_CAPSULE | Freq: Two times a day (BID) | ORAL | Status: DC
  Administered 2021-08-16 – 2021-08-22 (×11): 250 mg via ORAL
  Filled 2021-08-15 (×17): qty 1

## 2021-08-15 MED ORDER — ENSURE PRE-SURGERY PO LIQD
592.0000 mL | Freq: Once | ORAL | Status: DC
  Filled 2021-08-15: qty 592

## 2021-08-15 MED ORDER — STERILE WATER FOR INJECTION IJ SOLN
INTRAMUSCULAR | Status: AC
  Filled 2021-08-15: qty 10

## 2021-08-15 MED ORDER — BUPIVACAINE LIPOSOME 1.3 % IJ SUSP: 20.0000 mL | Freq: Once | INTRAMUSCULAR | Status: DC

## 2021-08-15 MED ORDER — DEXMEDETOMIDINE (PRECEDEX) IN NS 20 MCG/5ML (4 MCG/ML) IV SYRINGE
PREFILLED_SYRINGE | INTRAVENOUS | Status: DC | PRN
  Administered 2021-08-15: 12 ug via INTRAVENOUS

## 2021-08-15 MED ORDER — PHENYLEPHRINE 40 MCG/ML (10ML) SYRINGE FOR IV PUSH (FOR BLOOD PRESSURE SUPPORT)
PREFILLED_SYRINGE | INTRAVENOUS | Status: DC | PRN
  Administered 2021-08-15 (×2): 80 ug via INTRAVENOUS

## 2021-08-15 MED ORDER — FENTANYL CITRATE PF 50 MCG/ML IJ SOSY: 25.0000 ug | PREFILLED_SYRINGE | INTRAMUSCULAR | Status: DC | PRN

## 2021-08-15 MED ORDER — LIDOCAINE 2% (20 MG/ML) 5 ML SYRINGE
INTRAMUSCULAR | Status: DC | PRN
  Administered 2021-08-15: 1.5 mg/kg/h via INTRAVENOUS
  Administered 2021-08-15: 80 mg via INTRAVENOUS

## 2021-08-15 MED ORDER — LIDOCAINE HCL 2 % IJ SOLN
INTRAMUSCULAR | Status: AC
  Filled 2021-08-15: qty 20

## 2021-08-15 MED ORDER — SODIUM CHLORIDE (PF) 0.9 % IJ SOLN
INTRAMUSCULAR | Status: AC
  Filled 2021-08-15: qty 10

## 2021-08-15 MED ORDER — BUPIVACAINE LIPOSOME 1.3 % IJ SUSP
INTRAMUSCULAR | Status: DC | PRN
  Administered 2021-08-15: 20 mL

## 2021-08-15 MED ORDER — BUPIVACAINE-EPINEPHRINE 0.25% -1:200000 IJ SOLN
INTRAMUSCULAR | Status: DC | PRN
  Administered 2021-08-15: 30 mL

## 2021-08-15 MED ORDER — ALUM & MAG HYDROXIDE-SIMETH 200-200-20 MG/5ML PO SUSP: 30.0000 mL | Freq: Four times a day (QID) | ORAL | Status: DC | PRN

## 2021-08-15 MED ORDER — SUGAMMADEX SODIUM 200 MG/2ML IV SOLN
INTRAVENOUS | Status: DC | PRN
  Administered 2021-08-15: 200 mg via INTRAVENOUS

## 2021-08-15 MED ORDER — ACETAMINOPHEN 500 MG PO TABS
1000.0000 mg | ORAL_TABLET | Freq: Four times a day (QID) | ORAL | Status: AC
  Administered 2021-08-15 – 2021-08-22 (×16): 1000 mg via ORAL
  Filled 2021-08-15 (×18): qty 2

## 2021-08-15 MED ORDER — SODIUM CHLORIDE 0.9 % IV SOLN
2.0000 g | Freq: Two times a day (BID) | INTRAVENOUS | Status: AC
  Administered 2021-08-15: 2 g via INTRAVENOUS
  Filled 2021-08-15: qty 2

## 2021-08-15 MED ORDER — MIDAZOLAM HCL 5 MG/5ML IJ SOLN
INTRAMUSCULAR | Status: DC | PRN
  Administered 2021-08-15: 2 mg via INTRAVENOUS

## 2021-08-15 MED ORDER — ONDANSETRON HCL 4 MG PO TABS
4.0000 mg | ORAL_TABLET | Freq: Four times a day (QID) | ORAL | Status: DC | PRN
  Administered 2021-08-16 – 2021-09-13 (×4): 4 mg via ORAL
  Filled 2021-08-15 (×5): qty 1

## 2021-08-15 MED ORDER — 0.9 % SODIUM CHLORIDE (POUR BTL) OPTIME
TOPICAL | Status: DC | PRN
  Administered 2021-08-15: 2000 mL

## 2021-08-15 MED ORDER — MIDAZOLAM HCL 2 MG/2ML IJ SOLN
INTRAMUSCULAR | Status: AC
  Filled 2021-08-15: qty 2

## 2021-08-15 MED ORDER — PHENYLEPHRINE 40 MCG/ML (10ML) SYRINGE FOR IV PUSH (FOR BLOOD PRESSURE SUPPORT)
PREFILLED_SYRINGE | INTRAVENOUS | Status: AC
  Filled 2021-08-15: qty 10

## 2021-08-15 MED ORDER — ONDANSETRON HCL 4 MG/2ML IJ SOLN
INTRAMUSCULAR | Status: DC | PRN
Start: 2021-08-15 — End: 2021-08-15
  Administered 2021-08-15: 4 mg via INTRAVENOUS

## 2021-08-15 MED ORDER — OXYCODONE HCL 5 MG PO TABS: 5.0000 mg | ORAL_TABLET | Freq: Once | ORAL | Status: AC | PRN

## 2021-08-15 MED ORDER — HEPARIN SODIUM (PORCINE) 5000 UNIT/ML IJ SOLN
5000.0000 [IU] | Freq: Once | INTRAMUSCULAR | Status: AC
  Administered 2021-08-15: 5000 [IU] via SUBCUTANEOUS
  Filled 2021-08-15: qty 1

## 2021-08-15 MED ORDER — ONDANSETRON HCL 4 MG/2ML IJ SOLN
INTRAMUSCULAR | Status: AC
  Filled 2021-08-15: qty 2

## 2021-08-15 MED ORDER — ALVIMOPAN 12 MG PO CAPS
12.0000 mg | ORAL_CAPSULE | Freq: Two times a day (BID) | ORAL | Status: DC
  Administered 2021-08-16: 12 mg via ORAL
  Filled 2021-08-15: qty 1

## 2021-08-15 MED ORDER — SODIUM CHLORIDE 0.9 % IV SOLN
2.0000 g | INTRAVENOUS | Status: AC
  Administered 2021-08-15: 2 g via INTRAVENOUS
  Filled 2021-08-15: qty 2

## 2021-08-15 MED ORDER — INDOCYANINE GREEN 25 MG IV SOLR
INTRAVENOUS | Status: DC | PRN
Start: 2021-08-15 — End: 2021-08-15
  Administered 2021-08-15: 7.5 mg via INTRAVENOUS

## 2021-08-15 MED ORDER — ACETAMINOPHEN 500 MG PO TABS
1000.0000 mg | ORAL_TABLET | ORAL | Status: AC
  Administered 2021-08-15: 1000 mg via ORAL
  Filled 2021-08-15: qty 2

## 2021-08-15 MED ORDER — FENTANYL CITRATE (PF) 100 MCG/2ML IJ SOLN
INTRAMUSCULAR | Status: DC | PRN
  Administered 2021-08-15 (×4): 50 ug via INTRAVENOUS

## 2021-08-15 MED ORDER — ACETAMINOPHEN 325 MG PO TABS: 325.0000 mg | ORAL_TABLET | ORAL | Status: DC | PRN

## 2021-08-15 MED ORDER — LACTATED RINGERS IV SOLN: INTRAVENOUS | Status: DC

## 2021-08-15 MED ORDER — ENSURE SURGERY PO LIQD
237.0000 mL | Freq: Two times a day (BID) | ORAL | Status: DC
  Administered 2021-08-16 – 2021-08-17 (×3): 237 mL via ORAL
  Filled 2021-08-15 (×8): qty 237

## 2021-08-15 MED ORDER — DEXAMETHASONE SODIUM PHOSPHATE 10 MG/ML IJ SOLN
INTRAMUSCULAR | Status: DC | PRN
  Administered 2021-08-15: 8 mg via INTRAVENOUS

## 2021-08-15 MED ORDER — DIPHENHYDRAMINE HCL 50 MG/ML IJ SOLN: 12.5000 mg | Freq: Four times a day (QID) | INTRAMUSCULAR | Status: DC | PRN

## 2021-08-15 MED ORDER — CHLORHEXIDINE GLUCONATE CLOTH 2 % EX PADS
6.0000 | MEDICATED_PAD | Freq: Every day | CUTANEOUS | Status: DC
  Administered 2021-08-16 – 2021-11-06 (×79): 6 via TOPICAL

## 2021-08-15 MED ORDER — GABAPENTIN 300 MG PO CAPS
300.0000 mg | ORAL_CAPSULE | ORAL | Status: AC
  Administered 2021-08-15: 300 mg via ORAL
  Filled 2021-08-15: qty 1

## 2021-08-15 MED ORDER — BUPIVACAINE LIPOSOME 1.3 % IJ SUSP
INTRAMUSCULAR | Status: AC
  Filled 2021-08-15: qty 20

## 2021-08-15 MED ORDER — LACTATED RINGERS IV SOLN: INTRAVENOUS | Status: DC | PRN

## 2021-08-15 SURGICAL SUPPLY — 108 items
ADAPTER GOLDBERG URETERAL (ADAPTER) IMPLANT
ADPR CATH 15X14FR FL DRN BG (ADAPTER)
BAG COUNTER SPONGE SURGICOUNT (BAG) ×3 IMPLANT
BAG SPNG CNTER NS LX DISP (BAG) ×2
BAG URO CATCHER STRL LF (MISCELLANEOUS) ×3 IMPLANT
BALLN NEPHROSTOMY (BALLOONS) ×3
BALLOON NEPHROSTOMY (BALLOONS) IMPLANT
BLADE EXTENDED COATED 6.5IN (ELECTRODE) IMPLANT
CANNULA REDUC XI 12-8 STAPL (CANNULA)
CANNULA REDUCER 12-8 DVNC XI (CANNULA) IMPLANT
CATH INTERMIT  6FR 70CM (CATHETERS) ×1 IMPLANT
CATH URET 5FR 28IN OPEN ENDED (CATHETERS) IMPLANT
CELLS DAT CNTRL 66122 CELL SVR (MISCELLANEOUS) IMPLANT
CLOTH BEACON ORANGE TIMEOUT ST (SAFETY) ×3 IMPLANT
COVER SURGICAL LIGHT HANDLE (MISCELLANEOUS) ×6 IMPLANT
COVER TIP SHEARS 8 DVNC (MISCELLANEOUS) ×2 IMPLANT
COVER TIP SHEARS 8MM DA VINCI (MISCELLANEOUS) ×3
DECANTER SPIKE VIAL GLASS SM (MISCELLANEOUS) IMPLANT
DRAIN CHANNEL 19F RND (DRAIN) ×1 IMPLANT
DRAPE ARM DVNC X/XI (DISPOSABLE) ×8 IMPLANT
DRAPE COLUMN DVNC XI (DISPOSABLE) ×2 IMPLANT
DRAPE DA VINCI XI ARM (DISPOSABLE) ×12
DRAPE DA VINCI XI COLUMN (DISPOSABLE) ×3
DRAPE SURG IRRIG POUCH 19X23 (DRAPES) ×3 IMPLANT
DRSG OPSITE POSTOP 4X10 (GAUZE/BANDAGES/DRESSINGS) IMPLANT
DRSG OPSITE POSTOP 4X6 (GAUZE/BANDAGES/DRESSINGS) ×1 IMPLANT
DRSG OPSITE POSTOP 4X8 (GAUZE/BANDAGES/DRESSINGS) IMPLANT
ELECT PENCIL ROCKER SW 15FT (MISCELLANEOUS) ×3 IMPLANT
ELECT REM PT RETURN 15FT ADLT (MISCELLANEOUS) ×3 IMPLANT
ENDOLOOP SUT PDS II  0 18 (SUTURE)
ENDOLOOP SUT PDS II 0 18 (SUTURE) IMPLANT
EVACUATOR SILICONE 100CC (DRAIN) ×1 IMPLANT
GLOVE SURG ENC MOIS LTX SZ6.5 (GLOVE) ×9 IMPLANT
GLOVE SURG ENC TEXT LTX SZ7.5 (GLOVE) ×3 IMPLANT
GLOVE SURG UNDER POLY LF SZ7 (GLOVE) ×6 IMPLANT
GOWN STRL REUS W/TWL LRG LVL3 (GOWN DISPOSABLE) ×3 IMPLANT
GOWN STRL REUS W/TWL XL LVL3 (GOWN DISPOSABLE) ×9 IMPLANT
GRASPER SUT TROCAR 14GX15 (MISCELLANEOUS) IMPLANT
GUIDEWIRE ANG ZIPWIRE 038X150 (WIRE) IMPLANT
GUIDEWIRE STR DUAL SENSOR (WIRE) ×1 IMPLANT
HOLDER FOLEY CATH W/STRAP (MISCELLANEOUS) ×3 IMPLANT
IRRIG SUCT STRYKERFLOW 2 WTIP (MISCELLANEOUS) ×3
IRRIGATION SUCT STRKRFLW 2 WTP (MISCELLANEOUS) ×2 IMPLANT
KIT PROCEDURE DA VINCI SI (MISCELLANEOUS) ×3
KIT PROCEDURE DVNC SI (MISCELLANEOUS) ×2 IMPLANT
KIT SIGMOIDOSCOPE (SET/KITS/TRAYS/PACK) ×1 IMPLANT
KIT TURNOVER KIT A (KITS) IMPLANT
MANIFOLD NEPTUNE II (INSTRUMENTS) ×3 IMPLANT
NDL INSUFFLATION 14GA 120MM (NEEDLE) ×2 IMPLANT
NEEDLE INSUFFLATION 14GA 120MM (NEEDLE) ×3 IMPLANT
PACK CARDIOVASCULAR III (CUSTOM PROCEDURE TRAY) ×3 IMPLANT
PACK COLON (CUSTOM PROCEDURE TRAY) ×3 IMPLANT
PACK CYSTO (CUSTOM PROCEDURE TRAY) ×3 IMPLANT
PAD POSITIONING PINK XL (MISCELLANEOUS) ×3 IMPLANT
RELOAD STAPLE 60 3.5 BLU DVNC (STAPLE) IMPLANT
RELOAD STAPLE 60 4.3 GRN DVNC (STAPLE) IMPLANT
RELOAD STAPLER 3.5X60 BLU DVNC (STAPLE) IMPLANT
RELOAD STAPLER 4.3X60 GRN DVNC (STAPLE) ×8 IMPLANT
RETRACTOR WND ALEXIS 18 MED (MISCELLANEOUS) IMPLANT
RTRCTR WOUND ALEXIS 18CM MED (MISCELLANEOUS)
SCISSORS LAP 5X35 DISP (ENDOMECHANICALS) IMPLANT
SEAL CANN UNIV 5-8 DVNC XI (MISCELLANEOUS) ×6 IMPLANT
SEAL XI 5MM-8MM UNIVERSAL (MISCELLANEOUS) ×9
SEALER VESSEL DA VINCI XI (MISCELLANEOUS) ×3
SEALER VESSEL EXT DVNC XI (MISCELLANEOUS) ×2 IMPLANT
SOLUTION ELECTROLUBE (MISCELLANEOUS) ×3 IMPLANT
STAPLER 60 DA VINCI SURE FORM (STAPLE) ×3
STAPLER 60 SUREFORM DVNC (STAPLE) IMPLANT
STAPLER CANNULA SEAL DVNC XI (STAPLE) IMPLANT
STAPLER CANNULA SEAL XI (STAPLE) ×3
STAPLER ECHELON POWER CIR 29 (STAPLE) IMPLANT
STAPLER ECHELON POWER CIR 31 (STAPLE) IMPLANT
STAPLER RELOAD 3.5X60 BLU DVNC (STAPLE)
STAPLER RELOAD 3.5X60 BLUE (STAPLE)
STAPLER RELOAD 4.3X60 GREEN (STAPLE) ×12
STAPLER RELOAD 4.3X60 GRN DVNC (STAPLE) ×8
STOPCOCK 4 WAY LG BORE MALE ST (IV SETS) ×6 IMPLANT
SUT ETHILON 2 0 PS N (SUTURE) IMPLANT
SUT NOVA NAB DX-16 0-1 5-0 T12 (SUTURE) ×6 IMPLANT
SUT NOVA NAB GS-21 1 T12 (SUTURE) ×1 IMPLANT
SUT PROLENE 2 0 KS (SUTURE) IMPLANT
SUT PROLENE 2 0 SH DA (SUTURE) ×1 IMPLANT
SUT SILK 2 0 (SUTURE) ×3
SUT SILK 2 0 SH CR/8 (SUTURE) IMPLANT
SUT SILK 2-0 18XBRD TIE 12 (SUTURE) ×2 IMPLANT
SUT SILK 3 0 (SUTURE)
SUT SILK 3 0 SH CR/8 (SUTURE) ×3 IMPLANT
SUT SILK 3-0 18XBRD TIE 12 (SUTURE) IMPLANT
SUT V-LOC BARB 180 2/0GR6 GS22 (SUTURE)
SUT VIC AB 2-0 SH 18 (SUTURE) IMPLANT
SUT VIC AB 2-0 SH 27 (SUTURE)
SUT VIC AB 2-0 SH 27X BRD (SUTURE) IMPLANT
SUT VIC AB 3-0 SH 18 (SUTURE) IMPLANT
SUT VIC AB 4-0 PS2 27 (SUTURE) ×6 IMPLANT
SUT VICRYL 0 UR6 27IN ABS (SUTURE) ×3 IMPLANT
SUTURE V-LC BRB 180 2/0GR6GS22 (SUTURE) IMPLANT
SYR 10ML ECCENTRIC (SYRINGE) ×3 IMPLANT
SYS LAPSCP GELPORT 120MM (MISCELLANEOUS)
SYS WOUND ALEXIS 18CM MED (MISCELLANEOUS) ×3
SYSTEM LAPSCP GELPORT 120MM (MISCELLANEOUS) IMPLANT
SYSTEM WOUND ALEXIS 18CM MED (MISCELLANEOUS) IMPLANT
TOWEL OR 17X26 10 PK STRL BLUE (TOWEL DISPOSABLE) IMPLANT
TOWEL OR NON WOVEN STRL DISP B (DISPOSABLE) ×3 IMPLANT
TRAY FOLEY MTR SLVR 16FR STAT (SET/KITS/TRAYS/PACK) ×3 IMPLANT
TROCAR ADV FIXATION 5X100MM (TROCAR) ×3 IMPLANT
TUBING CONNECTING 10 (TUBING) ×9 IMPLANT
TUBING INSUFFLATION 10FT LAP (TUBING) ×3 IMPLANT
TUBING UROLOGY SET (TUBING) IMPLANT

## 2021-08-15 NOTE — Anesthesia Preprocedure Evaluation (Addendum)
Anesthesia Evaluation  Patient identified by MRN, date of birth, ID band Patient awake    Reviewed: Allergy & Precautions, NPO status , Patient's Chart, lab work & pertinent test results  Airway Mallampati: I  TM Distance: >3 FB Neck ROM: Full    Dental  (+) Teeth Intact, Dental Advisory Given, Missing,    Pulmonary neg pulmonary ROS,    Pulmonary exam normal        Cardiovascular negative cardio ROS   Rhythm:Regular Rate:Normal     Neuro/Psych negative neurological ROS  negative psych ROS   GI/Hepatic negative GI ROS, Neg liver ROS,   Endo/Other  negative endocrine ROS  Renal/GU negative Renal ROS     Musculoskeletal negative musculoskeletal ROS (+)   Abdominal Normal abdominal exam  (+)   Peds  Hematology negative hematology ROS (+)   Anesthesia Other Findings   Reproductive/Obstetrics                            Anesthesia Physical Anesthesia Plan  ASA: 2  Anesthesia Plan: General   Post-op Pain Management:    Induction: Intravenous  PONV Risk Score and Plan: 3 and Ondansetron, Dexamethasone and Midazolam  Airway Management Planned: Oral ETT  Additional Equipment: None  Intra-op Plan:   Post-operative Plan: Extubation in OR  Informed Consent: I have reviewed the patients History and Physical, chart, labs and discussed the procedure including the risks, benefits and alternatives for the proposed anesthesia with the patient or authorized representative who has indicated his/her understanding and acceptance.     Dental advisory given  Plan Discussed with: CRNA  Anesthesia Plan Comments: (- 2 IV's)       Anesthesia Quick Evaluation

## 2021-08-15 NOTE — Consult Note (Signed)
WOC requested for preoperative stoma site marking.  Patient was marked at his PAT visit 10/11.   Will follow up postoperatively for ostomy needs.   [place], [place], [place]

## 2021-08-15 NOTE — H&P (Signed)
History of Present Illness: Phillip Brooks is a 65 y.o. male who is seen today for f/u for rectal cancer.    65 year old male who presents to the office for evaluation of rectal cancer.  Patient was noticing rectal bleeding and weight loss.  He underwent colonoscopy earlier this year which showed a mid rectal mass approximately 7 cm from the anal verge extending to the rectosigmoid junction.  Biopsy showed adenocarcinoma.  The mass was tattooed distally.  CT scans of the chest, abdomen and pelvis show no signs of metastatic disease, but locally advanced rectal tumor.   MRI showed a T4bN2 lesion invading the left pelvic sidewall and left seminal vesicle.  He has completed total neoadjuvant chemotherapy, with his last radiation dose on 8/25.  Post procedure MRI shows some response to therapy and some improvement in nodal disease.  There is decreased conspicuity of the left pelvic sidewall lymph node.       Past Medical History:  Diagnosis Date   Anemia     Rectal cancer ([place])      Stage III   Seasonal allergies           Past Surgical History:  Procedure Laterality Date   COLONOSCOPY       PORTACATH PLACEMENT Right 01/03/2021    Procedure: INSERTION PORT-A-CATH ULTRASOUND GUIDED THROUGH THE RIGHT IJ.;  Surgeon: Leighton Ruff, MD;  Location: [facility];  Service: General;  Laterality: Right;  ROOM 5 STARTING  AT 12:00PM FOR 46 MIN   TONSILLECTOMY       WRIST SURGERY Right           Family History  Problem Relation Age of Onset   Hypertension Mother     Heart attack Father     Cancer Sister     Stomach cancer Neg Hx     Colon cancer Neg Hx     Esophageal cancer Neg Hx     Pancreatic cancer Neg Hx     Rectal cancer Neg Hx      Social History         Socioeconomic History   Marital status: Divorced      Spouse name: Not on file   Number of children: Not on file   Years of education: Not on file   Highest education level: Not on file  Occupational History   Not on file  Tobacco Use    Smoking status: Never   Smokeless tobacco: Current      Types: Snuff, Chew  Vaping Use   Vaping Use: Never used  Substance and Sexual Activity   Alcohol use: Yes      Comment: daily beer    Drug use: No   Sexual activity: Yes      Birth control/protection: Condom  Other Topics Concern   Not on file  Social History Narrative   Not on file    Social Determinants of Health    Financial Resource Strain: Not on file  Food Insecurity: Not on file  Transportation Needs: Not on file  Physical Activity: Not on file  Stress: Not on file  Social Connections: Not on file  Intimate Partner Violence: Not on file         Outpatient Encounter Medications as of 07/01/2021  Medication Sig Note   acetaminophen (TYLENOL) 650 MG CR tablet Take 650 mg by mouth every 8 (eight) hours as needed for pain.             hydrocortisone (ANUSOL-HC) 25  MG suppository Place 1 suppository (25 mg total) rectally 2 (two) times daily.     lidocaine-prilocaine (EMLA) cream Apply to portacath site 1-2 hours prior to use 01/01/2021: Have not started   prochlorperazine (COMPAZINE) 10 MG tablet Take 1 tablet (10 mg total) by mouth every 6 (six) hours as needed for nausea or vomiting. 01/01/2021: Have not started   traMADol (ULTRAM) 50 MG tablet TAKE 1 TABLET(50 MG) BY MOUTH EVERY 12 HOURS AS NEEDED. DO NOT DRIVE WHILE TAKING TRAMADOL      No facility-administered encounter medications on file as of 07/01/2021.    No Known Allergies Review of Systems - General ROS: negative for - chills or fever Respiratory ROS: no cough, shortness of breath, or wheezing Cardiovascular ROS: no chest pain or dyspnea on exertion Gastrointestinal ROS: no abdominal pain, change in bowel habits, or black or bloody stools Genito-Urinary ROS: no dysuria, trouble voiding, or hematuria    Objective:      Vitals:   07/21/2021 0535  BP: (!) 150/81  Pulse: 87  Resp: 16  Temp: 98.2 F (36.8 C)  SpO2: 100%      Gen: NAD Abd: soft,  nondistended CV:RRR Lungs: CTA     Labs, Imaging and Diagnostic Testing:  CT scans of the chest, abdomen and pelvis show no signs of metastatic disease, but locally advanced rectal tumor.   Pre treatment MRI showed a T4bN2 lesion invading the left pelvic sidewall and left seminal vesicle.  Post treatment MRI shows some response to therapy and some improvement in nodal disease.  There is decreased conspicuity of the left pelvic sidewall lymph node.     Assessment and Plan:  Diagnoses and all orders for this visit:   Malignant neoplasm of rectum (CMS-HCC)     65 year old male with locally advanced mid rectal cancer.  He has completed total neoadjuvant chemotherapy and radiation.  He has had some response to treatment on follow-up MRI.  I would recommend low anterior resection with diverting ileostomy.  We have discussed the need for resection of the left pelvic sidewall and left seminal vesicle.  We have discussed implications of this including risk of damaging adjacent structures and pelvic nerves responsible for sexual function and bladder function.  We have discussed the need for identification of ureters using firefly catheter injections.  We will plan on doing a diverting ileostomy until the area heals.   The surgery and anatomy were described to the patient as well as the risks of surgery and the possible complications.  These include: Bleeding, deep abdominal infections and possible wound complications such as hernia and infection, damage to adjacent structures, leak of surgical connections, which can lead to other surgeries and possibly an ostomy, possible need for other procedures, such as abscess drains in radiology, possible prolonged hospital stay, possible diarrhea from removal of part of the colon, possible constipation from narcotics, possible bowel, bladder or sexual dysfunction if having rectal surgery, prolonged fatigue/weakness or appetite loss, possible early recurrence of of  disease, possible complications of their medical problems such as heart disease or arrhythmias or lung problems, death (less than 1%). I believe the patient understands and wishes to proceed with the surgery.

## 2021-08-15 NOTE — Transfer of Care (Signed)
Immediate Anesthesia Transfer of Care Note  Patient: Phillip Brooks  Procedure(s) Performed: XI ROBOTIC ASSISTED LOWER ANTERIOR RESECTION, WITH BILATERAL TAP BLOCK AND INTRAOPERATIVE ASSESSMENT USING ICG (Abdomen) DIVERTING ILEOSTOMY (Abdomen) CYSTOSCOPY with FIREFLY INJECTION AND URETHERAL DILATION  Patient Location: PACU  Anesthesia Type:General  Level of Consciousness: awake, alert , oriented and patient cooperative  Airway & Oxygen Therapy: Patient Spontanous Breathing and Patient connected to face mask oxygen  Post-op Assessment: Report given to RN, Post -op Vital signs reviewed and stable and Patient moving all extremities X 4  Post vital signs: Reviewed and stable  Last Vitals:  Vitals Value Taken Time  BP 120/72 08/04/2021 1236  Temp    Pulse 87 07/22/2021 1242  Resp 24 08/14/2021 1242  SpO2 100 % 08/14/2021 1242  Vitals shown include unvalidated device data.  Last Pain:  Vitals:   08/16/2021 0614  TempSrc:   PainSc: 8       Patients Stated Pain Goal: 4 (40/08/67 6195)  Complications: No notable events documented.

## 2021-08-15 NOTE — Consult Note (Signed)
  I was asked to provide cystoscopy with firefly injection.  I have reviewed the pertinent records and imaging.  I discussed the procedure with Phillip Brooks and reviewed the risks of the procedure including bleeding, infection and possible injury to the urinary tract.  He is agreeable to proceed.

## 2021-08-15 NOTE — Op Note (Signed)
07/27/2021  11:58 AM  PATIENT:  Phillip Brooks  65 y.o. male  Patient Care Team: System, Provider Not In as PCP - General Jonnie Finner, RN (Inactive) as Oncology Nurse Navigator Ladell Pier, MD as Consulting Physician (Oncology) Gwyndolyn Kaufman, RN (Inactive) as Registered Nurse  PRE-OPERATIVE DIAGNOSIS:  malignant neoplasm of rectum  POST-OPERATIVE DIAGNOSIS:  Locally perforated malignant neoplasm of rectum  PROCEDURE:  XI ROBOTIC ASSISTED LOWER ANTERIOR RESECTION, WITH BILATERAL TAP BLOCK AND INTRAOPERATIVE ASSESSMENT OF PERFUSION USING ICG, DIVERTING ILEOSTOMY  CYSTOSCOPY with FIREFLY INJECTION AND URETHERAL DILATION    Surgeon(s): Leighton Ruff, MD Ileana Roup, MD Irine Seal, MD  ASSISTANT: Dr Dema Severin   ANESTHESIA:   local and general  EBL: 72ml  Total I/O In: 1100 [I.V.:1000; IV Piggyback:100] Out: 575 [Urine:550; Blood:25]  Delay start of Pharmacological VTE agent (>24hrs) due to surgical blood loss or risk of bleeding:  no  DRAINS: (68F) Jackson-Pratt drain(s) with closed bulb suction in the pelvis    SPECIMEN:  Source of Specimen:  rectosigmoid  DISPOSITION OF SPECIMEN:  PATHOLOGY  COUNTS:  YES  PLAN OF CARE: Admit to inpatient   PATIENT DISPOSITION:  PACU - hemodynamically stable.  INDICATION:    Locally advanced rectal cancer s/p TNT.  I recommended low anterior resection:  The anatomy & physiology of the digestive tract was discussed.  The pathophysiology was discussed.  Natural history risks without surgery was discussed.   I worked to give an overview of the disease and the frequent need to have multispecialty involvement.  I feel the risks of no intervention will lead to serious problems that outweigh the operative risks; therefore, I recommended a partial colectomy to remove the pathology.  Minimally invasive & open techniques were discussed.   Risks such as bleeding, infection, abscess, leak, reoperation, possible ostomy,  hernia, heart attack, death, and other risks were discussed.  I noted a good likelihood this will help address the problem.   Goals of post-operative recovery were discussed as well.    The patient expressed understanding & wished to proceed with surgery.  OR FINDINGS:   Patient had focally perforated L anterior mid rectum.  No obvious metastatic disease on visceral parietal peritoneum or liver.  The anastomosis rests ~5 cm from the anal verge by rigid proctoscopy.  DESCRIPTION:   Informed consent was confirmed.  The patient underwent general anaesthesia without difficulty.  The patient was positioned appropriately.  VTE prevention in place.  The patient's abdomen was clipped, prepped, & draped in a sterile fashion.  Surgical timeout confirmed our plan.  The patient was positioned in reverse Trendelenburg.  Abdominal entry was gained using a Varies needle in the LUQ.  Entry was clean.  I induced carbon dioxide insufflation.  An 85mm robotic port was placed in the RUQ.  Camera inspection revealed no injury.  Extra ports were carefully placed under direct laparoscopic visualization.  I laparoscopically reflected the greater omentum and the upper abdomen the small bowel in the upper abdomen. The patient was appropriately positioned and the robot was docked to the patient's left side.  Instruments were placed under direct visualization.   The sigmoid colon was moderately dilated due to obstruction.  I mobilized the sigmoid colon off of the pelvic sidewall.  The patient's anterior sigmoid colon was adherent to the anterior peritoneal reflection.  I attempted blunt dissection in this area but it quickly became rather fibrotic.  I scored the base of peritoneum of the right side of  the mesentery of the left colon from the ligament of Treitz to the peritoneal reflection of the mid rectum.  The patient had a large mass in his mid rectum.  I elevated the sigmoid mesentery and enetered into the retro-mesenteric  plane. We were able to identify the left ureter and gonadal vessels. We kept those posterior within the retroperitoneum and elevated the left colon mesentery off that. I did isolated IMA pedicle but did not ligate it yet.  I continued distally and got into the avascular plane posterior to the mesorectum. This allowed me to help mobilize the rectum as well by freeing the mesorectum off the sacrum.  I mobilized the peritoneal coverings towards the peritoneal reflection on both the right and left sides of the rectum.  I could see the right and left ureters and stayed away from them.  These were directly adherent to the mesorectal plane.  There were separated using blunt dissection.   I skeletonized the inferior mesenteric artery pedicle.  I went down to its takeoff from the aorta.   I isolated the inferior mesenteric vein off of the ligament of Treitz just cephalad to that as well.  After confirming the left ureter was out of the way, I went ahead and ligated the inferior mesenteric artery pedicle with bipolar robotic vessel sealer ~2cm above its takeoff from the aorta.   I did ligate the inferior mesenteric vein in a similar fashion.  We ensured hemostasis.  I freed off the retroperitoneal plane bluntly.  I mobilized the left colon in a lateral to medial fashion off the line of Toldt up towards the splenic flexure to ensure good mobilization of the left colon to reach into the pelvis.  I then began my dissection into the pelvis.  I dissected out the posterior mesorectal plane using blunt dissection and the robotic vessel sealer.  I continued up the lateral edges of the mesorectum separating the ureters from the fibrotic plane.  There were significant adhesions in the mid mesorectal plane and we switched to robotic scissors and electrocautery.  I continued my dissection anteriorly.  Once inside the left anterior mesorectal plane, I encountered abscess cavity with purulence and stool.  This was resected free  anteriorly dividing the seminal vesicles from the plane using sharp dissection.  I continued anteriorly on the left and right sides incrementally dividing the scar separating the rectum from the seminal vesicles and prostate.  I was able to get down below the area of the tumor.  I continued my mobilization down to the pelvic floor using the robotic vessel sealer to allow for better mobilization of the tumor out of the pelvis.  Once this was complete a 60 mm robotic green load stapler was introduced into the pelvis.  The distal rectum was divided using 3 staple loads.  I then divided the descending sigmoid junction in similar fashion using a green load robotic stapler.  The remaining mesentery was divided and the colon was freed.  At this point we decided to evaluate perfusion.  3 mL of firefly was injected into the patient's IV.  Perfusion was noted within the small bowel and the large bowel almost at the same time.  Perfusion was noted up to the level of mesenteric dissection on the descending sigmoid junction.  There was perfusion noted of the rectal stump as well.  Once this was complete, the robot was removed and undocked.  A Pfannenstiel incision was made and Alexis wound protector was placed through this.  The rectum was removed from the wound.  This was marked with a stitch proximal and sent to pathology for further examination.  The distal end of the colon was removed from the abdomen.  The pelvis was irrigated with 2 L of warm normal saline.  Hemostasis was good.  The staple line was removed from the colon and a large amount of stool from his obstruction and bowel prep was removed from the colon into a basin and the colon was irrigated with normal saline.  A 2-0 Prolene pursestring suture was placed into the distal colon and a 31 mm EEA anvil was placed inside the colon and the pursestring was tied tightly around this.  This was then placed back into the abdomen.  The cap was placed on the wound protector  and the EEA stapler was inserted into the rectum and brought out through the distal rectal stump.  An anastomosis was created under direct laparoscopic visualization.  There was no tension noted on the anastomosis.  There was no leak when tested with insufflation under irrigation.  The anastomosis rests about 3 cm from the proximal sphincters.  At rest approximately 5 cm from the anal verge.  Once this was complete a 21 [place] Blake drain was placed in the pelvis and brought out through the right lower quadrant port site.  This was secured with a nylon suture.  A portion of the terminal ileum was identified and brought out through the 12 mm right upper quadrant port site as a diverting loop ileostomy.  A 16 French red rubber was placed around the ileostomy.  We then removed the Alexis wound protector and robotic ports and switched to clean gowns, gloves, instruments and drapes.  The peritoneum of the Pfannenstiel incision was closed using a running 0 Vicryl suture.  The fascia was closed using a running #1 PDS suture x2.  The subcutaneous tissue was reapproximated using a running 2-0 Vicryl suture.  The skin was closed using a running 4-0 Vicryl subcuticular suture.  The remaining port sites were also closed using 4-0 Vicryl sutures and Dermabond.  The ileostomy was then matured in standard Brooke fashion using interrupted 3-0 Vicryl sutures.  An ostomy appliance was placed.  The patient was then awakened from anesthesia and sent to the postanesthesia care unit in stable condition.  All counts were correct per operating room staff.  An MD assistant was necessary for tissue manipulation, retraction and positioning due to the complexity of the case and hospital policies

## 2021-08-15 NOTE — Op Note (Signed)
Procedure: 1.  Cystoscopy with balloon dilation of bulbar urethral stricture. 2.  Cystoscopy with bilateral ureteral catheterization with instillation of firefly solution.  Preop diagnosis: Rectal cancer.  Postop diagnosis: Same with urethral stricture disease in the anterior and bulbar urethra.  Surgeon: Dr. Irine Seal.  Anesthesia: General.  Specimen: None.  Drain: 79 [place] Foley catheter.  EBL: None.  Complications: None.  Indications: The patient is a 65 year old male who is undergoing surgery for rectal cancer by Dr. Marcello Moores and firefly instillation was requested.  The patient is also had some ongoing urinary symptomatology that is difficult explained by his cancer and cystoscopic assessment was requested.  Procedure: He was taken operating room where general anesthetic was induced.  He was given antibiotics per general surgery.  He was placed in lithotomy position and fitted with PAS hose.  His his perineum and genitalia were prepped with Betadine solution he was draped in usual sterile fashion.  Cystoscopy was performed using a 21 [place] scope and 30 degree lens.  Examination demonstrated a normal distal urethra, but in the proximal anterior urethra there was some mild urethral stricture disease that precluded scope passage.  Leander Rams sounds were then used from 22-26 [place] to dilate this portion of the urethra.  The cystoscope was then advanced after dilation and in the very proximal bulb he was found to have a much tighter stricture approximately 8-10 French in diameter.  The cystoscope was exchanged for a 6.5 French semirigid short ureteroscope which I then advanced through the strictured segment into the bladder.  Examination proximal stricture revealed a normal sphincter.  The prostatic urethra short with mild bilobar hyperplasia without significant obstruction.  Examination of the bladder demonstrated no obvious lesions through the ureteroscope.  A sensor wire was advanced  through the ureteroscope into the bladder and the ureteroscope was removed.  A15 cm x 24 French urethral balloon dilation catheter was then passed across the bulbar stricture and the balloon was inflated to 20 atm.  After appropriate period of time, the balloon was deflated and removed.  The cystoscope was then reinserted alongside the wire and further inspection demonstrated disruption of the stricture.  The bladder wall was smooth and pale without tumor, stones or inflammation.  There was some mild irritation of the posterior wall from the wire.  Ureteral orifices were unremarkable.  No evidence of bladder involvement of the rectal cancer was identified.  A 5 French ureteral catheter was then passed and 7 mL of firefly solution was instilled up each ureteral orifice without difficulty.  The cystoscope was then removed and a 16 French Foley catheter was inserted.  The balloon was filled with 10 mL of sterile fluid and the catheter was placed to straight drainage.  The procedure was then turned over to Dr. Marcello Moores.  There were no complications during my portion of the procedure.

## 2021-08-15 NOTE — Anesthesia Postprocedure Evaluation (Signed)
Anesthesia Post Note  Patient: Phillip Brooks  Procedure(s) Performed: XI ROBOTIC ASSISTED LOWER ANTERIOR RESECTION, WITH BILATERAL TAP BLOCK AND INTRAOPERATIVE ASSESSMENT USING ICG (Abdomen) DIVERTING ILEOSTOMY (Abdomen) CYSTOSCOPY with FIREFLY INJECTION AND URETHERAL DILATION     Patient location during evaluation: PACU Anesthesia Type: General Level of consciousness: awake and alert Pain management: pain level controlled Vital Signs Assessment: post-procedure vital signs reviewed and stable Respiratory status: spontaneous breathing, nonlabored ventilation, respiratory function stable and patient connected to nasal cannula oxygen Cardiovascular status: blood pressure returned to baseline and stable Postop Assessment: no apparent nausea or vomiting Anesthetic complications: no   No notable events documented.  Last Vitals:  Vitals:   08/17/2021 1545 07/31/2021 1618  BP: (!) 147/88 (!) 149/88  Pulse: 79 83  Resp: 15 18  Temp:  36.7 C  SpO2: 98% 98%    Last Pain:  Vitals:   07/26/2021 1618  TempSrc: Oral  PainSc:                  [name]

## 2021-08-15 NOTE — Discharge Instructions (Addendum)
You had a tight narrowing of the urethra that is the probable cause of your urinary symptoms.  I dilated the stricture, but they can recur.   I will want to have you follow up in my office in a few weeks to reassess your urinary symptoms.    [facility]: Medical Oncology--[name], Dr. Gearldine Shown office will call you after discharge to arrange follow up with oncology. He plans to see you in office 3-4 weeks after your discharge. Call us with any questions at [phone].  Suggestions For Increasing Calories And Protein Several small meals a day are easier to eat and digest than three large ones. Space meals about 2 to 3 hours apart to maximize comfort. Stop eating 2 to 3 hours before bed and sleep with your head elevated if gastric reflux (GERD) and heartburn are problems. Do not eat your favorite foods if you are feeling bad. Save them for when you feel good! Eat breakfast-type foods at any meal. Eggs are usually easy to eat and are great any time of the day. (The same goes for pancakes and waffles.) Eat when you feel hungry. Most people have the greatest appetite in the morning because they have not eaten all night. If this is the best meal for you, then pile on those calories and other nutrients in the morning and at lunch. Then you can have a smaller dinner without losing total calories for the day. Eat leftovers or nutritious snacks in the afternoon and early evening to round out your day. Try homemade or commercially prepared nutrition bars and puddings, as well as calorie- and protein-rich liquid nutritional supplements. Benefits of Physical Activity Talk to your doctor about physical activity. Light or moderate physical activity can help maintain muscle and promote an appetite. Walking in the neighborhood or the local mall is a great way to get up, get out, and get moving. If you are unsteady on your feet, try walking around the dining room table. Save Room for [employer]! Drink most fluids  between meals instead of with meals. (It is fine to have a sip to help swallow food at meal time.) Fluids (which usually have fewer calories and nutrients than solid food) can take up valuable space in your stomach.  Foods Recommended High-Protein Foods Milk products Add cheese to toast, crackers, sandwiches, baked potatoes, vegetables, soups, noodles, meat, and fruit. Use reduced-fat (2%) or whole milk in place of water when cooking cereal and cream soups. Include cream sauces on vegetables and pasta. Add powdered milk to cream soups and mashed potatoes.  Eggs Have hard-cooked eggs readily available in the refrigerator. Chop and add to salads, casseroles, soups, and vegetables. Make a quick egg salad. All eggs should be well cooked to avoid the risk of harmful bacteria.  Meats, poultry, and fish Add leftover cooked meats to soups, casseroles, salads, and omelets. Make dip by mixing diced, chopped, or shredded meat with sour cream and spices.  Beans, legumes, nuts, and seeds Sprinkle nuts and seeds on cereals, fruit, and desserts such as ice cream, pudding, and custard. Also serve nuts and seeds on vegetables, salads, and pasta. Spread peanut butter on toast, bread, English muffins, and fruit, or blend it in a milk shake. Add beans and peas to salads, soups, casseroles, and vegetable dishes.  High-Calorie Foods Butter, margarine, and  oils Melt butter or margarine over potatoes, rice, pasta, and cooked vegetables. Add melted butter or margarine into soups and casseroles and spread on bread for sandwiches before spreading  sandwich spread or peanut butter. Saut or stir-fry vegetables, meats, chicken and fish such as shrimp/scallops in olive or canola oil. A variety of oils add calories and can be used to [employer], chicken, or fish.  Milk products Add whipping cream to desserts, pancakes, waffles, fruit, and hot chocolate, and fold it into soups and casseroles. Add sour cream to baked potatoes and  vegetables.  Salad dressing Use regular (not low-fat or diet) mayonnaise and salad dressing on sandwiches and in dips with vegetables and fruit.   Sweets Add jelly and honey to bread and crackers. Add jam to fruit and ice cream and as a topping over cake.   Copyright 2020  [employer]y of [employer]etics. All rights reserved.

## 2021-08-15 NOTE — Plan of Care (Signed)
  Problem: Clinical Measurements: Goal: Will remain free from infection Outcome: Progressing Goal: Respiratory complications will improve Outcome: Progressing Goal: Cardiovascular complication will be avoided Outcome: Progressing   Problem: Coping: Goal: Level of anxiety will decrease Outcome: Progressing

## 2021-08-15 NOTE — Anesthesia Procedure Notes (Signed)

## 2021-08-16 ENCOUNTER — Encounter (HOSPITAL_COMMUNITY): Payer: Self-pay | Admitting: General Surgery

## 2021-08-16 LAB — BASIC METABOLIC PANEL
Anion gap: 8 (ref 5–15)
BUN: 8 mg/dL (ref 8–23)
CO2: 23 mmol/L (ref 22–32)
Calcium: 8.8 mg/dL — ABNORMAL LOW (ref 8.9–10.3)
Chloride: 100 mmol/L (ref 98–111)
Creatinine, Ser: 0.79 mg/dL (ref 0.61–1.24)
GFR, Estimated: >60 mL/min (ref >60)
Glucose, Bld: 153 mg/dL — ABNORMAL HIGH (ref 70–99)
Potassium: 3.9 mmol/L (ref 3.5–5.1)
Sodium: 131 mmol/L — ABNORMAL LOW (ref 135–145)

## 2021-08-16 LAB — CBC
HCT: 31.9 % — ABNORMAL LOW (ref 39.0–52.0)
Hemoglobin: 10.2 g/dL — ABNORMAL LOW (ref 13.0–17.0)
MCH: 28.9 pg (ref 26.0–34.0)
MCHC: 32 g/dL (ref 30.0–36.0)
MCV: 90.4 fL (ref 80.0–100.0)
Platelets: 335 10*3/uL (ref 150–400)
RBC: 3.53 MIL/uL — ABNORMAL LOW (ref 4.22–5.81)
RDW: 13.8 % (ref 11.5–15.5)
WBC: 8.1 10*3/uL (ref 4.0–10.5)
nRBC: 0 % (ref 0.0–0.2)

## 2021-08-16 MED ORDER — KETOROLAC TROMETHAMINE 15 MG/ML IJ SOLN
15.0000 mg | Freq: Four times a day (QID) | INTRAMUSCULAR | Status: DC
  Administered 2021-08-16 – 2021-08-18 (×8): 15 mg via INTRAVENOUS
  Filled 2021-08-16 (×8): qty 1

## 2021-08-16 MED ORDER — HYDROMORPHONE HCL 1 MG/ML IJ SOLN
1.0000 mg | INTRAMUSCULAR | Status: DC | PRN
  Administered 2021-08-16 – 2021-08-20 (×21): 1 mg via INTRAVENOUS
  Filled 2021-08-16 (×22): qty 1

## 2021-08-16 NOTE — Progress Notes (Signed)
Pharmacy Brief Note - Alvimopan (Entereg)  The standing order set for alvimopan (Entereg) now includes an automatic order to discontinue the drug after the patient has had a bowel movement. The change was approved by the [place] and the Medical Executive Committee.   This patient has had bowel movements documented by nursing. Therefore, alvimopan has been discontinued. If there are questions, please contact the pharmacy at [phone].   Thank you-   Dia Sitter, PharmD, BCPS 08/16/2021 10:37 AM

## 2021-08-16 NOTE — Plan of Care (Signed)
  Problem: Education: Goal: Knowledge of General Education information will improve Description: Including pain rating scale, medication(s)/side effects and non-pharmacologic comfort measures Outcome: Progressing   Problem: Clinical Measurements: Goal: Ability to maintain clinical measurements within normal limits will improve Outcome: Progressing Goal: Will remain free from infection Outcome: Progressing   Problem: Nutrition: Goal: Adequate nutrition will be maintained Outcome: Progressing   Problem: Pain Managment: Goal: General experience of comfort will improve Outcome: Progressing

## 2021-08-16 NOTE — Progress Notes (Signed)
1 Day Post-Op   Subjective/Chief Complaint: Patient is having nausea, vomiting Pain is poorly controlled Some stool in bag Serosanguinous drainage in JP Good UOP via Foley   Objective: Vital signs in last 24 hours: Temp:  [98 F (36.7 C)-98.4 F (36.9 C)] 98 F (36.7 C) (10/29 0438) Pulse Rate:  [75-90] 83 (10/29 0438) Resp:  [15-33] 18 (10/29 0438) BP: (115-149)/(66-102) 130/80 (10/29 0438) SpO2:  [96 %-100 %] 98 % (10/29 0438) Weight:  [61.1 kg] 61.1 kg (10/29 0345)    Intake/Output from previous day: 10/28 0701 - 10/29 0700 In: 2523.5 [P.O.:360; I.V.:2063.5; IV Piggyback:100] Out: 2135 [Urine:1100; Drains:910; Stool:100; Blood:25] Intake/Output this shift: No intake/output data recorded.  General appearance: alert, cooperative, and no distress GI: distended, tender in lower abdomen; ostomy pink with some liquid stool Incisions OK, drain with serosanguinous drainage  Lab Results:  Recent Labs    08/16/21 0353  WBC 8.1  HGB 10.2*  HCT 31.9*  PLT 335   BMET Recent Labs    08/16/21 0353  NA 131*  K 3.9  CL 100  CO2 23  GLUCOSE 153*  BUN 8  CREATININE 0.79  CALCIUM 8.8*   PT/INR No results for input(s): LABPROT, INR in the last 72 hours. ABG No results for input(s): PHART, HCO3 in the last 72 hours.  Invalid input(s): PCO2, PO2  Studies/Results: No results found.  Anti-infectives: Anti-infectives (From admission, onward)    Start     Dose/Rate Route Frequency Ordered Stop   07/25/2021 2000  cefoTEtan (CEFOTAN) 2 g in sodium chloride 0.9 % 100 mL IVPB        2 g 200 mL/hr over 30 Minutes Intravenous Every 12 hours 07/19/2021 1647 08/06/2021 2046   08/01/2021 0600  cefoTEtan (CEFOTAN) 2 g in sodium chloride 0.9 % 100 mL IVPB        2 g 200 mL/hr over 30 Minutes Intravenous On call to O.R. 07/20/2021 8003 08/11/2021 0806       Assessment/Plan: s/p Procedure(s): XI ROBOTIC ASSISTED LOWER ANTERIOR RESECTION, WITH BILATERAL TAP BLOCK AND INTRAOPERATIVE  ASSESSMENT USING ICG (N/A) DIVERTING ILEOSTOMY (N/A) CYSTOSCOPY with FIREFLY INJECTION AND URETHERAL DILATION (N/A) Do not advance past clear liquids Adjust pain meds - Dilaudid 1 mg IV q2 PRN, add Toradol Mobilize Leave Foley at least until Sunday due to dissection, stenting, risk for retention after stricture dilation  LOS: 1 day    Maia Petties 08/16/2021

## 2021-08-16 NOTE — Consult Note (Signed)
[facility] Nurse ostomy consult note Consult received for patient with new ostomy. [facility] Nursing will plan for first pouch change and begin ostomy education on Monday, 10/31.  [facility] nursing team will follow, and will remain available to this patient, the nursing and medical teams.   Thank you.  Maudie Flakes, MSN, RN, [place], [facility]  Pager# [phone]

## 2021-08-17 LAB — CBC
HCT: 35.7 % — ABNORMAL LOW (ref 39.0–52.0)
Hemoglobin: 11.3 g/dL — ABNORMAL LOW (ref 13.0–17.0)
MCH: 28.2 pg (ref 26.0–34.0)
MCHC: 31.7 g/dL (ref 30.0–36.0)
MCV: 89 fL (ref 80.0–100.0)
Platelets: 357 10*3/uL (ref 150–400)
RBC: 4.01 MIL/uL — ABNORMAL LOW (ref 4.22–5.81)
RDW: 13.7 % (ref 11.5–15.5)
WBC: 9.1 10*3/uL (ref 4.0–10.5)
nRBC: 0 % (ref 0.0–0.2)

## 2021-08-17 LAB — BASIC METABOLIC PANEL
Anion gap: 9 (ref 5–15)
BUN: 15 mg/dL (ref 8–23)
CO2: 27 mmol/L (ref 22–32)
Calcium: 9.1 mg/dL (ref 8.9–10.3)
Chloride: 96 mmol/L — ABNORMAL LOW (ref 98–111)
Creatinine, Ser: 0.94 mg/dL (ref 0.61–1.24)
GFR, Estimated: >60 mL/min (ref >60)
Glucose, Bld: 109 mg/dL — ABNORMAL HIGH (ref 70–99)
Potassium: 3.9 mmol/L (ref 3.5–5.1)
Sodium: 132 mmol/L — ABNORMAL LOW (ref 135–145)

## 2021-08-17 NOTE — Progress Notes (Signed)
2 Days Post-Op   Subjective/Chief Complaint: Still slightly nauseated last night, but better today Pain is much better controlled with Toradol, PRN dilaudid Some ileostomy output JP serosanguinous drainage Good UOP via Foley   Objective: Vital signs in last 24 hours: Temp:  [97.9 F (36.6 C)-99 F (37.2 C)] 97.9 F (36.6 C) (10/30 0502) Pulse Rate:  [81-99] 99 (10/30 0502) Resp:  [18-20] 20 (10/30 0502) BP: (120-151)/(83-91) 120/83 (10/30 0502) SpO2:  [96 %-99 %] 97 % (10/30 0502) Weight:  [59 kg] 59 kg (10/30 0443) Last BM Date: 08/16/21  Intake/Output from previous day: 10/29 0701 - 10/30 0700 In: 937 [P.O.:937] Out: 1250 [Urine:800; Drains:300; Stool:150] Intake/Output this shift: No intake/output data recorded.  General appearance: alert, cooperative, and no distress GI: distended, tender in lower abdomen; ostomy pink with some liquid stool Incisions OK, drain with serosanguinous drainage  Lab Results:  Recent Labs    08/16/21 0353 08/17/21 0335  WBC 8.1 9.1  HGB 10.2* 11.3*  HCT 31.9* 35.7*  PLT 335 357   BMET Recent Labs    08/16/21 0353 08/17/21 0335  NA 131* 132*  K 3.9 3.9  CL 100 96*  CO2 23 27  GLUCOSE 153* 109*  BUN 8 15  CREATININE 0.79 0.94  CALCIUM 8.8* 9.1   PT/INR No results for input(s): LABPROT, INR in the last 72 hours. ABG No results for input(s): PHART, HCO3 in the last 72 hours.  Invalid input(s): PCO2, PO2  Studies/Results: No results found.  Anti-infectives: Anti-infectives (From admission, onward)    Start     Dose/Rate Route Frequency Ordered Stop   07/20/2021 2000  cefoTEtan (CEFOTAN) 2 g in sodium chloride 0.9 % 100 mL IVPB        2 g 200 mL/hr over 30 Minutes Intravenous Every 12 hours 07/22/2021 1647 07/30/2021 2046   07/23/2021 0600  cefoTEtan (CEFOTAN) 2 g in sodium chloride 0.9 % 100 mL IVPB        2 g 200 mL/hr over 30 Minutes Intravenous On call to O.R. 08/02/2021 0513 08/03/2021 0806       Assessment/Plan: s/p  Procedure(s): XI ROBOTIC ASSISTED LOWER ANTERIOR RESECTION, WITH BILATERAL TAP BLOCK AND INTRAOPERATIVE ASSESSMENT USING ICG (N/A) DIVERTING ILEOSTOMY (N/A) CYSTOSCOPY with FIREFLY INJECTION AND URETHERAL DILATION (N/A) Full liquids Continue current pain regimen for now Mobilize D/C Foley; at risk for urinary retention after stricture dilation  LOS: 2 days    Maia Petties 08/17/2021

## 2021-08-17 NOTE — Plan of Care (Signed)
  Problem: Education: Goal: Knowledge of General Education information will improve Description: Including pain rating scale, medication(s)/side effects and non-pharmacologic comfort measures Outcome: Progressing   Problem: Clinical Measurements: Goal: Ability to maintain clinical measurements within normal limits will improve Outcome: Progressing Goal: Cardiovascular complication will be avoided Outcome: Progressing   Problem: Coping: Goal: Level of anxiety will decrease Outcome: Progressing   Problem: Safety: Goal: Ability to remain free from injury will improve Outcome: Progressing

## 2021-08-17 NOTE — Progress Notes (Signed)
Foley removed per order. No urine output post removal. Bladder scanner showed 54mL. On coming nurse Ophelia Shoulder. notified. Pt out of bed and in chair for approximately 4hrs.

## 2021-08-18 ENCOUNTER — Inpatient Hospital Stay (HOSPITAL_COMMUNITY): Payer: Medicare HMO

## 2021-08-18 ENCOUNTER — Other Ambulatory Visit (HOSPITAL_COMMUNITY): Payer: Self-pay

## 2021-08-18 DIAGNOSIS — R1110 Vomiting, unspecified: Secondary | ICD-10-CM | POA: Diagnosis not present

## 2021-08-18 LAB — CBC
HCT: 33.4 % — ABNORMAL LOW (ref 39.0–52.0)
Hemoglobin: 10.5 g/dL — ABNORMAL LOW (ref 13.0–17.0)
MCH: 28.3 pg (ref 26.0–34.0)
MCHC: 31.4 g/dL (ref 30.0–36.0)
MCV: 90 fL (ref 80.0–100.0)
Platelets: 364 10*3/uL (ref 150–400)
RBC: 3.71 MIL/uL — ABNORMAL LOW (ref 4.22–5.81)
RDW: 13.9 % (ref 11.5–15.5)
WBC: 10.3 10*3/uL (ref 4.0–10.5)
nRBC: 0 % (ref 0.0–0.2)

## 2021-08-18 LAB — BASIC METABOLIC PANEL
Anion gap: 11 (ref 5–15)
BUN: 31 mg/dL — ABNORMAL HIGH (ref 8–23)
CO2: 26 mmol/L (ref 22–32)
Calcium: 8.9 mg/dL (ref 8.9–10.3)
Chloride: 92 mmol/L — ABNORMAL LOW (ref 98–111)
Creatinine, Ser: 1.43 mg/dL — ABNORMAL HIGH (ref 0.61–1.24)
GFR, Estimated: 54 mL/min — ABNORMAL LOW (ref >60)
Glucose, Bld: 112 mg/dL — ABNORMAL HIGH (ref 70–99)
Potassium: 3.6 mmol/L (ref 3.5–5.1)
Sodium: 129 mmol/L — ABNORMAL LOW (ref 135–145)

## 2021-08-18 MED ORDER — SODIUM CHLORIDE 0.9 % IV SOLN: INTRAVENOUS | Status: DC

## 2021-08-18 NOTE — Progress Notes (Signed)
Called for worsening abdominal distension, small volume vomit, crepitus. Came to bedside around 20:15. Patient in no apparent distress. Definitely distended. Wound clean dry and intact, ostomy pink and patent with bowel sweat. There was a small area of crepitus on the left mid abdomen but no signs of soft tissue infection. XR showing dilated loops of bowel consistent with ileus. -continue bowel rest

## 2021-08-18 NOTE — Progress Notes (Signed)
3 Days Post-Op robotic LAR, diverting ostomy  Subjective/Chief Complaint: Patient is having nausea, vomiting Pain is currently controlled Serosanguinous drainage in JP Pt has had NO UOP since foley almost 24h ago   Objective: Vital signs in last 24 hours: Temp:  [98.2 F (36.8 C)-98.6 F (37 C)] 98.2 F (36.8 C) (10/31 0511) Pulse Rate:  [96-99] 96 (10/31 0511) Resp:  [15-18] 15 (10/30 2000) BP: (99-124)/(73-102) 99/73 (10/31 0511) SpO2:  [95 %-99 %] 97 % (10/31 0511) Weight:  [60.3 kg] 60.3 kg (10/31 0334) Last BM Date: 08/18/21  Intake/Output from previous day: 10/30 0701 - 10/31 0700 In: 960 [P.O.:960] Out: 850 [Urine:400; Drains:150; Stool:300] Intake/Output this shift: No intake/output data recorded.  General appearance: alert, cooperative, and no distress GI: distended, tender in lower abdomen; ostomy pink with some liquid stool Incisions OK, drain with serosanguinous drainage  Lab Results:  Recent Labs    08/17/21 0335 08/18/21 0356  WBC 9.1 10.3  HGB 11.3* 10.5*  HCT 35.7* 33.4*  PLT 357 364    BMET Recent Labs    08/17/21 0335 08/18/21 0356  NA 132* 129*  K 3.9 3.6  CL 96* 92*  CO2 27 26  GLUCOSE 109* 112*  BUN 15 31*  CREATININE 0.94 1.43*  CALCIUM 9.1 8.9    PT/INR No results for input(s): LABPROT, INR in the last 72 hours. ABG No results for input(s): PHART, HCO3 in the last 72 hours.  Invalid input(s): PCO2, PO2  Studies/Results: No results found.  Anti-infectives: Anti-infectives (From admission, onward)    Start     Dose/Rate Route Frequency Ordered Stop   08/14/2021 2000  cefoTEtan (CEFOTAN) 2 g in sodium chloride 0.9 % 100 mL IVPB        2 g 200 mL/hr over 30 Minutes Intravenous Every 12 hours 07/26/2021 1647 08/10/2021 2046   08/09/2021 0600  cefoTEtan (CEFOTAN) 2 g in sodium chloride 0.9 % 100 mL IVPB        2 g 200 mL/hr over 30 Minutes Intravenous On call to O.R. 07/24/2021 2355 08/05/2021 0806       Assessment/Plan: s/p  Procedure(s): XI ROBOTIC ASSISTED LOWER ANTERIOR RESECTION, WITH BILATERAL TAP BLOCK AND INTRAOPERATIVE ASSESSMENT USING ICG (N/A) DIVERTING ILEOSTOMY (N/A) CYSTOSCOPY with FIREFLY INJECTION AND URETHERAL DILATION (N/A) NPO until better bowel function Ambulate in hall Dehydration due to N/V: no UOP in 24h, Cr up to 1.45- reinsert foley for close uop monitoring and start NS IVF's at 154ml/h, recheck labs in AM Adjust pain meds - Dilaudid 1 mg IV q2 PRN, D/C Toradol due to increased Cr Mobilize   LOS: 3 days    Rosario Adie 73/22/0254

## 2021-08-18 NOTE — Consult Note (Signed)
[facility] Nurse ostomy consult note Stoma type/location: RLQ, loop ileostomy  Stomal assessment/size: slightly prolapsed, pink, moist, stoma points downward, red rubber catheter in place for support  Peristomal assessment: medical adhesive related skin injury from midline dressings between stoma and incisions.  Treatment options for stomal/peristomal skin: 2" skin barrier ring Output liquid stool  Ostomy pouching: 2pc. 2 3/4" with 2" skin barrier  Education provided:  Explained role of ostomy nurse and creation of stoma  Explained stoma characteristics (budded, flush, color, texture, care) discussed creation of loop stoma Demonstrated pouch change (cutting new skin barrier, measuring stoma, cleaning peristomal skin and stoma, use of barrier ring) Education on emptying and how to empty Demonstrated use of wick to clean spout  Patient practiced lock and roll closure Patient is sleepy during my visit; received IV pain meds during my visit. Pain control is an issue. Patient reports he is "essentially homeless" but has somewhere he can stay for now.  He is working on housing.  He has insurance though to cover ostomy supplies.    Enrolled patient in [facility] program: Yes  Will follow up this week for ostomy teaching.   [place], [place], [place]

## 2021-08-19 ENCOUNTER — Encounter (HOSPITAL_COMMUNITY): Payer: Self-pay | Admitting: General Surgery

## 2021-08-19 ENCOUNTER — Other Ambulatory Visit (HOSPITAL_COMMUNITY): Payer: Self-pay

## 2021-08-19 LAB — BASIC METABOLIC PANEL
Anion gap: 13 (ref 5–15)
BUN: 27 mg/dL — ABNORMAL HIGH (ref 8–23)
CO2: 23 mmol/L (ref 22–32)
Calcium: 9 mg/dL (ref 8.9–10.3)
Chloride: 97 mmol/L — ABNORMAL LOW (ref 98–111)
Creatinine, Ser: 0.98 mg/dL (ref 0.61–1.24)
GFR, Estimated: >60 mL/min (ref >60)
Glucose, Bld: 88 mg/dL (ref 70–99)
Potassium: 3.7 mmol/L (ref 3.5–5.1)
Sodium: 133 mmol/L — ABNORMAL LOW (ref 135–145)

## 2021-08-19 LAB — CBC
HCT: 32 % — ABNORMAL LOW (ref 39.0–52.0)
Hemoglobin: 10.1 g/dL — ABNORMAL LOW (ref 13.0–17.0)
MCH: 28.6 pg (ref 26.0–34.0)
MCHC: 31.6 g/dL (ref 30.0–36.0)
MCV: 90.7 fL (ref 80.0–100.0)
Platelets: 423 10*3/uL — ABNORMAL HIGH (ref 150–400)
RBC: 3.53 MIL/uL — ABNORMAL LOW (ref 4.22–5.81)
RDW: 14.1 % (ref 11.5–15.5)
WBC: 9 10*3/uL (ref 4.0–10.5)
nRBC: 0 % (ref 0.0–0.2)

## 2021-08-19 NOTE — Plan of Care (Signed)
  Problem: Pain Managment: Goal: General experience of comfort will improve Outcome: Progressing   Problem: Coping: Goal: Level of anxiety will decrease Outcome: Progressing   Problem: Elimination: Goal: Will not experience complications related to bowel motility Outcome: Progressing Goal: Will not experience complications related to urinary retention Outcome: Progressing

## 2021-08-19 NOTE — Evaluation (Signed)
Physical Therapy Evaluation Patient Details Name: Phillip Brooks MRN: [id number] DOB: [birth year] Today's Date: 08/19/2021  History of Present Illness   Patient is 65 y.o. male s/p robotic LAR, diverting ostomy on 08/04/2021 for advanced mid rectal cancer.   Clinical Impression  Phillip Brooks is 65 y.o. male admitted with above HPI and diagnosis. Patient is currently limited by functional impairments below (see PT problem list). Patient lives with a roommate and is independent at baseline. Currently pt required min guard/assist and significant extra time and energy to complete mobility. Patient will benefit from continued skilled PT interventions to address impairments and progress independence with mobility, recommending HHPT pending further progress and assist needed/available at discharge. Acute PT will follow and progress as able.        Recommendations for follow up therapy are one component of a multi-disciplinary discharge planning process, led by the attending physician.  Recommendations may be updated based on patient status, additional functional criteria and insurance authorization.     08/19/21 1100  PT Visit Information  Last PT Received On 08/19/21  Assistance Needed +1  History of Present Illness Patient is 65 y.o. male s/p robotic LAR, diverting ostomy on 08/14/2021 for advanced mid rectal cancer.  Precautions  Precautions Fall  Precaution Comments abdominal surgery,  Restrictions  Weight Bearing Restrictions No  Home Living  Family/patient expects to be discharged to: Private residence  Living Arrangements Non-relatives/Friends Fritz [place])  Available Help at Discharge Friend(s)  Type of [place] to enter  Entrance Stairs-Number of Steps unsure  Entrance Stairs-Rails  (thinks there is a rail)  Home Layout One level  Home Equipment None  Additional Comments pt has unclear discharge plan, reports family coming down from [place] to help him.   Prior Function  Prior Level of Function  Independent/Modified Independent  Communication  Communication No difficulties  Cognition  Arousal/Alertness Awake/alert  Behavior During Therapy WFL for tasks assessed/performed  Overall Cognitive Status Within Functional Limits for tasks assessed  General Comments pt somewhat tangental in conversation, very pleasant, seems to enjoy joking.  Upper Extremity Assessment  Upper Extremity Assessment Overall WFL for tasks assessed  Lower Extremity Assessment  Lower Extremity Assessment Overall WFL for tasks assessed  Cervical / Trunk Assessment  Cervical / Trunk Assessment Kyphotic  Bed Mobility  Overal bed mobility Needs Assistance  Bed Mobility Supine to Sit  Supine to sit Min guard;HOB elevated  General bed mobility comments cues to use bed rail, pt taking extra time and slow to initiate movement to EOB. guarding for safety.  Transfers  Overall transfer level Needs assistance  Equipment used Rolling walker (2 wheels)  Transfers Sit to/from Stand  Sit to Stand Min assist  General transfer comment cues for safe technique to rise to RW, pt takgin extra time to fully straighten posture.  Ambulation/Gait  Ambulation/Gait assistance Min assist  Gait Distance (Feet) 90 Feet  Assistive device Rolling walker (2 wheels)  Gait Pattern/deviations Step-through pattern;Decreased stride length;Knee flexed in stance - right;Knee flexed in stance - left;Shuffle;Trunk flexed  General Gait Details trunk flexed/crouched gait due to abdominal pain. posture improved as gait progressed. cues for proximity to RW required intermittently.  Gait velocity decr  Balance  Overall balance assessment Needs assistance  Sitting-balance support Feet supported  Sitting balance-Leahy Scale Good  Standing balance support During functional activity;Reliant on assistive device for balance;Bilateral upper extremity supported  Standing balance-Leahy Scale Poor  Standing balance  comment heavy reliance on RW  for support  PT - End of Session  Equipment Utilized During Treatment Gait belt  Activity Tolerance Patient tolerated treatment well  Patient left in chair;with call bell/phone within reach;with chair alarm set  Nurse Communication Mobility status  PT Assessment  PT Recommendation/Assessment Patient needs continued PT services  PT Visit Diagnosis Muscle weakness (generalized) (M62.81);Difficulty in walking, not elsewhere classified (R26.2);Unsteadiness on feet (R26.81)  PT Problem List Decreased strength;Decreased activity tolerance;Decreased balance;Decreased mobility;Decreased knowledge of use of DME;Decreased safety awareness;Decreased knowledge of precautions;Pain  Barriers to Discharge Decreased caregiver support;Inaccessible home environment  Barriers to Discharge Comments unclear assistance available and location pt plans to discharge  PT Plan  PT Frequency (ACUTE ONLY) Min 3X/week  PT Treatment/Interventions (ACUTE ONLY) DME instruction;Gait training;Stair training;Functional mobility training;Therapeutic activities;Therapeutic exercise;Balance training;Patient/family education  AM-PAC PT "6 Clicks" Mobility Outcome Measure (Version 2)  Help needed turning from your back to your side while in a flat bed without using bedrails? 3  Help needed moving from lying on your back to sitting on the side of a flat bed without using bedrails? 3  Help needed moving to and from a bed to a chair (including a wheelchair)? 3  Help needed standing up from a chair using your arms (e.g., wheelchair or bedside chair)? 3  Help needed to walk in hospital room? 3  Help needed climbing 3-5 steps with a railing?  3  6 Click Score 18  Consider Recommendation of Discharge To: Home with [facility]  Progressive Mobility  What is the highest level of mobility based on the progressive mobility assessment? Level 4 (Walks with assist in room) - Balance while marching in place and cannot step  forward and back - Complete  Mobility Ambulated with assistance in room;Ambulated with assistance in hallway;Out of bed to chair with meals;Out of bed for toileting  PT Recommendation  Follow Up Recommendations Home health PT  Assistance recommended at discharge Intermittent Supervision/Assistance  Functional Status Assessment Patient has had a recent decline in their functional status and demonstrates the ability to make significant improvements in function in a reasonable and predictable amount of time.  PT equipment Rolling walker (2 wheels)  Individuals Consulted  Consulted and Agree with Results and Recommendations Patient  Acute Rehab PT Goals  Patient Stated Goal regain independence  PT Goal Formulation With patient  Time For Goal Achievement 09/02/21  Potential to Achieve Goals Good  PT Time Calculation  PT Start Time (ACUTE ONLY) 1054  PT Stop Time (ACUTE ONLY) 1135  PT Time Calculation (min) (ACUTE ONLY) 41 min  PT General Charges  $$ ACUTE PT VISIT 1 Visit  PT Evaluation  $PT Eval Moderate Complexity 1 Mod  PT Treatments  $Gait Training 23-37 mins  Written Expression  Dominant Hand Right    Verner Mould, DPT Acute Rehabilitation Services Office [phone] Pager [phone]    Jacques Navy 08/19/2021, 7:47 PM

## 2021-08-19 NOTE — Progress Notes (Signed)
Patient's transferred to room 1335 from 4w. Pt's alert and oriented x4. No pain at this time. Respiration even and nonlabor. Feeling nausea. Skin dry and warm to touch. Blister to left mid abdomen.  JP and illeostomy present and patent.

## 2021-08-19 NOTE — Progress Notes (Signed)
4 Days Post-Op robotic LAR, diverting ostomy  Subjective/Chief Complaint: Patient is having less nausea Pain is currently controlled Serosanguinous drainage in JP Pt had better UOP since foley and IVF's replaced   Objective: Vital signs in last 24 hours: Temp:  [97.8 F (36.6 C)-98.6 F (37 C)] 98.6 F (37 C) (11/01 1950) Pulse Rate:  [97-103] 103 (11/01 0633) Resp:  [18] 18 (11/01 0633) BP: (110-122)/(81-86) 120/81 (11/01 0633) SpO2:  [96 %-98 %] 96 % (11/01 9326) Weight:  [61 kg] 61 kg (11/01 0500) Last BM Date: 08/18/21  Intake/Output from previous day: 10/31 0701 - 11/01 0700 In: 1650.3 [P.O.:20; I.V.:1630.3] Out: 1205 [Urine:950; Emesis/NG output:100; Drains:20; Stool:135] Intake/Output this shift: No intake/output data recorded.  General appearance: alert, cooperative, and no distress GI: distended, tender in lower abdomen; ostomy pink with some liquid stool Incisions OK, drain with serosanguinous drainage  Lab Results:  Recent Labs    08/18/21 0356 08/19/21 0351  WBC 10.3 9.0  HGB 10.5* 10.1*  HCT 33.4* 32.0*  PLT 364 423*    BMET Recent Labs    08/18/21 0356 08/19/21 0351  NA 129* 133*  K 3.6 3.7  CL 92* 97*  CO2 26 23  GLUCOSE 112* 88  BUN 31* 27*  CREATININE 1.43* 0.98  CALCIUM 8.9 9.0    PT/INR No results for input(s): LABPROT, INR in the last 72 hours. ABG No results for input(s): PHART, HCO3 in the last 72 hours.  Invalid input(s): PCO2, PO2  Studies/Results: DG Abd Portable 1V  Result Date: 08/18/2021 CLINICAL DATA:  Vomiting, abdominal distention EXAM: PORTABLE ABDOMEN - 1 VIEW COMPARISON:  CT 12/10/2020 FINDINGS: Dilated small bowel loops are noted in the abdomen concerning for small bowel obstruction. Surgical drain in the pelvis. No free air or organomegaly. IMPRESSION: Dilated small bowel loops concerning for small bowel obstruction. Electronically Signed   By: Rolm Baptise M.D.   On: 08/18/2021 21:23     Anti-infectives: Anti-infectives (From admission, onward)    Start     Dose/Rate Route Frequency Ordered Stop   08/14/2021 2000  cefoTEtan (CEFOTAN) 2 g in sodium chloride 0.9 % 100 mL IVPB        2 g 200 mL/hr over 30 Minutes Intravenous Every 12 hours 08/03/2021 1647 07/23/2021 2046   07/29/2021 0600  cefoTEtan (CEFOTAN) 2 g in sodium chloride 0.9 % 100 mL IVPB        2 g 200 mL/hr over 30 Minutes Intravenous On call to O.R. 07/22/2021 7124 08/10/2021 0806       Assessment/Plan: s/p Procedure(s): XI ROBOTIC ASSISTED LOWER ANTERIOR RESECTION, WITH BILATERAL TAP BLOCK AND INTRAOPERATIVE ASSESSMENT USING ICG (N/A) DIVERTING ILEOSTOMY (N/A) CYSTOSCOPY with FIREFLY INJECTION AND URETHERAL DILATION (N/A) NPO until better bowel function Ambulate in hall, PT eval Dehydration due to N/V: Cr normal, UOP good-  recheck labs in AM Cont IV pain meds - Dilaudid 1 mg IV q2 PRN,  Mobilize   LOS: 4 days    Rosario Adie 58/0/9983

## 2021-08-19 DEATH — deceased

## 2021-08-20 ENCOUNTER — Inpatient Hospital Stay (HOSPITAL_COMMUNITY): Payer: Medicare HMO

## 2021-08-20 ENCOUNTER — Other Ambulatory Visit (HOSPITAL_COMMUNITY): Payer: Self-pay

## 2021-08-20 DIAGNOSIS — K5939 Other megacolon: Secondary | ICD-10-CM | POA: Diagnosis not present

## 2021-08-20 DIAGNOSIS — Z872 Personal history of diseases of the skin and subcutaneous tissue: Secondary | ICD-10-CM | POA: Diagnosis not present

## 2021-08-20 DIAGNOSIS — K6389 Other specified diseases of intestine: Secondary | ICD-10-CM | POA: Diagnosis not present

## 2021-08-20 DIAGNOSIS — Z4659 Encounter for fitting and adjustment of other gastrointestinal appliance and device: Secondary | ICD-10-CM | POA: Diagnosis not present

## 2021-08-20 DIAGNOSIS — C189 Malignant neoplasm of colon, unspecified: Secondary | ICD-10-CM | POA: Diagnosis not present

## 2021-08-20 DIAGNOSIS — N3289 Other specified disorders of bladder: Secondary | ICD-10-CM | POA: Diagnosis not present

## 2021-08-20 LAB — BASIC METABOLIC PANEL
Anion gap: 13 (ref 5–15)
BUN: 15 mg/dL (ref 8–23)
CO2: 20 mmol/L — ABNORMAL LOW (ref 22–32)
Calcium: 8.5 mg/dL — ABNORMAL LOW (ref 8.9–10.3)
Chloride: 100 mmol/L (ref 98–111)
Creatinine, Ser: 0.65 mg/dL (ref 0.61–1.24)
GFR, Estimated: >60 mL/min (ref >60)
Glucose, Bld: 87 mg/dL (ref 70–99)
Potassium: 3.7 mmol/L (ref 3.5–5.1)
Sodium: 133 mmol/L — ABNORMAL LOW (ref 135–145)

## 2021-08-20 LAB — CBC
HCT: 29.5 % — ABNORMAL LOW (ref 39.0–52.0)
Hemoglobin: 9.1 g/dL — ABNORMAL LOW (ref 13.0–17.0)
MCH: 28.2 pg (ref 26.0–34.0)
MCHC: 30.8 g/dL (ref 30.0–36.0)
MCV: 91.3 fL (ref 80.0–100.0)
Platelets: 428 10*3/uL — ABNORMAL HIGH (ref 150–400)
RBC: 3.23 MIL/uL — ABNORMAL LOW (ref 4.22–5.81)
RDW: 14.4 % (ref 11.5–15.5)
WBC: 10.1 10*3/uL (ref 4.0–10.5)
nRBC: 0 % (ref 0.0–0.2)

## 2021-08-20 LAB — MAGNESIUM: Magnesium: 2.3 mg/dL (ref 1.7–2.4)

## 2021-08-20 LAB — PHOSPHORUS: Phosphorus: 2.7 mg/dL (ref 2.5–4.6)

## 2021-08-20 MED ORDER — PHENOL 1.4 % MT LIQD
2.0000 | OROMUCOSAL | Status: DC | PRN
  Administered 2021-08-20: 2 via OROMUCOSAL
  Filled 2021-08-20: qty 177

## 2021-08-20 MED ORDER — HYDROMORPHONE HCL 1 MG/ML IJ SOLN
1.0000 mg | INTRAMUSCULAR | Status: DC | PRN
  Administered 2021-08-20 – 2021-08-28 (×40): 1 mg via INTRAVENOUS
  Filled 2021-08-20 (×44): qty 1

## 2021-08-20 MED ORDER — LIP MEDEX EX OINT
TOPICAL_OINTMENT | CUTANEOUS | Status: AC
  Administered 2021-08-20: 75
  Filled 2021-08-20: qty 7

## 2021-08-20 NOTE — Plan of Care (Signed)

## 2021-08-20 NOTE — Evaluation (Signed)
Occupational Therapy Evaluation Patient Details Name: Phillip Brooks MRN: [id number] DOB: Nov 20, 1955 Today's Date: 08/20/2021   History of Present Illness Patient is 65 y.o. male s/p robotic LAR, diverting ostomy on 07/19/2021 for advanced mid rectal cancer.   Clinical Impression   PTA, pt was living at home with roommate. Reports independence in ADLs and IADLs. Upon evaluation, pt with decreased functional strength, balance, and abdominal pain at ileostomy site limiting ROM for LB dressing and bathing. Pt currently requiring Max A for LB dressing, Mod A for LB bathing, set up for UB ADLs, and Min guard for sit to stands with RW and min guard with RW with room distance mobility in room and hallway. Pt may benefit from LB AE pending continued abdominal pain. Patient will benefit from skilled OT services while in hospital to improve deficits and learn compensatory strategies as needed in order to return to PLOF. Due to decreased level of support and to optimize safety post acute stay, recommending [place]. May update to no OT follow up pending pt progress.      Recommendations for follow up therapy are one component of a multi-disciplinary discharge planning process, led by the attending physician.  Recommendations may be updated based on patient status, additional functional criteria and insurance authorization.   Follow Up Recommendations  Home health OT    Assistance Recommended at Discharge Intermittent Supervision/Assistance  Functional Status Assessment  Patient has had a recent decline in their functional status and demonstrates the ability to make significant improvements in function in a reasonable and predictable amount of time.  Equipment Recommendations  Other (comment) (TBD)    Recommendations for Other Services       Precautions / Restrictions Precautions Precautions: Fall Precaution Comments: abdominal surgery, Restrictions Weight Bearing Restrictions: No      Mobility Bed  Mobility Overal bed mobility: Needs Assistance Bed Mobility: Supine to Sit     Supine to sit: Min guard;HOB elevated     General bed mobility comments: Pt educated on log rolling, but politely declined and requested HOB be raised fully to sit EOB    Transfers Overall transfer level: Needs assistance Equipment used: Rolling walker (2 wheels) Transfers: Sit to/from Stand Sit to Stand: Min assist           General transfer comment: increased time, pt needing min A for power up.      Balance Overall balance assessment: Needs assistance Sitting-balance support: Feet supported Sitting balance-Leahy Scale: Good     Standing balance support: During functional activity;Reliant on assistive device for balance;Bilateral upper extremity supported Standing balance-Leahy Scale: Poor Standing balance comment: reliance on RW for support                           ADL either performed or assessed with clinical judgement   ADL Overall ADL's : Needs assistance/impaired Eating/Feeding: Modified independent;Sitting   Grooming: Standing;Set up   Upper Body Bathing: Sitting;Set up   Lower Body Bathing: Moderate assistance;Sit to/from stand   Upper Body Dressing : Set up;Sitting   Lower Body Dressing: Maximal assistance;Sit to/from stand Lower Body Dressing Details (indicate cue type and reason): pt reported he could not bend at all due to increased pain Toilet Transfer: Ambulation;Regular Toilet;Minimal assistance;Rolling walker (2 wheels)   Toileting- Clothing Manipulation and Hygiene: Sit to/from stand;Minimal assistance (not including assistance needed for ostomy care.)       Functional mobility during ADLs: Minimal assistance;Rolling walker (2 wheels);Cueing for  safety       Vision Patient Visual Report: No change from baseline       Perception     Praxis      Pertinent Vitals/Pain Pain Assessment: 0-10 Pain Score: 10-Worst pain ever (pt faces pain level:  4) Pain Location: abdomen Pain Descriptors / Indicators: Grimacing;Discomfort Pain Intervention(s): Limited activity within patient's tolerance;Monitored during session;Patient requesting pain meds-RN notified;Premedicated before session     Hand Dominance Right   Extremity/Trunk Assessment Upper Extremity Assessment Upper Extremity Assessment: Generalized weakness;RUE deficits/detail;LUE deficits/detail RUE Deficits / Details: gross grip 3+/5, biceps and triceps 4-/5. However, pt seemed to be hesitant to not strain abdomen area during testing. LUE Deficits / Details: gross grip 3+/5, biceps and triceps 4-/5. However, pt seemed to be hesitant to not strain abdomen area during testing.   Lower Extremity Assessment Lower Extremity Assessment: Defer to PT evaluation   Cervical / Trunk Assessment Cervical / Trunk Assessment: Kyphotic   Communication Communication Communication: No difficulties   Cognition Arousal/Alertness: Awake/alert Behavior During Therapy: WFL for tasks assessed/performed Overall Cognitive Status: Within Functional Limits for tasks assessed                                 General Comments: pt somewhat tangental in conversation, very pleasant, seems to enjoy joking.     General Comments       Exercises     Shoulder Instructions      Home Living Family/patient expects to be discharged to:: Private residence Living Arrangements: Non-relatives/Friends   Type of Home: Apartment Home Access: Stairs to enter Technical brewer of Steps: 3   Home Layout: One level     Bathroom Shower/Tub: Tub/shower unit         Home Equipment: None   Additional Comments: pt has unclear discharge plan, reports family coming down from [place] to help him- per PT report, reported that he has no available assistance during OT session.      Prior Functioning/Environment Prior Level of Function : Independent/Modified Independent;Driving                         OT Problem List: Decreased strength;Decreased range of motion;Impaired balance (sitting and/or standing);Decreased knowledge of use of DME or AE;Decreased safety awareness;Pain      OT Treatment/Interventions: Self-care/ADL training;Therapeutic exercise;DME and/or AE instruction;Therapeutic activities;Patient/family education;Balance training    OT Goals(Current goals can be found in the care plan section) Acute Rehab OT Goals Patient Stated Goal: To go home with therapy OT Goal Formulation: With patient Time For Goal Achievement: 09/03/21 Potential to Achieve Goals: Good  OT Frequency: Min 2X/week   Barriers to D/C:            Co-evaluation              AM-PAC OT "6 Clicks" Daily Activity     Outcome Measure Help from another person eating meals?: None Help from another person taking care of personal grooming?: A Little Help from another person toileting, which includes using toliet, bedpan, or urinal?: A Little Help from another person bathing (including washing, rinsing, drying)?: A Lot Help from another person to put on and taking off regular upper body clothing?: A Little Help from another person to put on and taking off regular lower body clothing?: A Lot 6 Click Score: 17   End of Session Equipment Utilized During Treatment: Gait belt;Rolling walker (  2 wheels) Nurse Communication: Mobility status;Patient requests pain meds (nurse cleared for activity)  Activity Tolerance: Patient tolerated treatment well Patient left: in bed;with bed alarm set;with call bell/phone within reach  OT Visit Diagnosis: Pain;Muscle weakness (generalized) (M62.81)                Time: 6553-7482 OT Time Calculation (min): 40 min Charges:  OT General Charges $OT Visit: 1 Visit OT Evaluation $OT Eval Low Complexity: 1 Low OT Treatments $Self Care/Home Management : 8-22 mins $Therapeutic Activity: 8-22 mins  Jackquline Denmark, OTS Acute Rehab Office: [phone]    Tacia Hindley 08/20/2021, 12:51 PM

## 2021-08-20 NOTE — TOC Initial Note (Signed)
Transition of Care [facility]) - Initial/Assessment Note   Patient Details  Name: Phillip Brooks MRN: [id number] Date of Birth: [birth year]  Transition of Care [facility]) CM/SW Contact:    Sherie Don, LCSW Phone Number: 08/20/2021, 11:18 AM  Clinical Narrative: TOC consulted for housing issues. CSW met with patient to complete assessment. Per patient, he was previously living in his roommate's home, but it was sold so he has been living with a friend in their house. Patient reported there are issues with the toilet not flushing and expressed concerns with being able to manage his ostomy in that environment.  Patient reported he was offered a shelter bed for 6 months, but declined the offer because he knew he would have surgery resulting in an ostomy. Patient reported he has been in touch with the Housing Authority to try and get on the waitlist. Patient reported he has a Product/process development scientist, Welford Roche [phone]), through [facility] that is working to assist him with housing. Patient gave verbal permission for CSW to call Mr. Felix Ahmadi.  CSW called with Mr. Felix Ahmadi. Per Mr. Felix Ahmadi, he saw the patient last week and informed him that he will need proof of income through social securtiy, a CCA (mental health evaluation), and FL2. Mr. Felix Ahmadi asked if the CCA could be done in the hospital. CSW explained that as the patient was not having active mental health issues it could not be done. Mr. Felix Ahmadi requested FL2 to show patient meets rest home level of care and that it be emailed to [email or web address].  FL2 done and emailed to Mr. Ficht. TOC to follow.  Expected Discharge Plan: [place] Barriers to Discharge: Continued Medical Work up  Patient Goals and CMS Choice Patient states their goals for this hospitalization and ongoing recovery are:: Find stable housing long term  Expected Discharge Plan and Services Expected Discharge Plan: [place] In-house Referral: Clinical Social  Work Living arrangements for the past 2 months: Single Family Home           DME Arranged: N/A DME Agency: NA  Prior Living Arrangements/Services Living arrangements for the past 2 months: [place] Lives with:: Roommate Patient language and need for interpreter reviewed:: Yes Do you feel safe going back to the place where you live?: Yes      Need for Family Participation in Patient Care: Yes (Comment) Care giver support system in place?: No (comment) Criminal Activity/Legal Involvement Pertinent to Current Situation/Hospitalization: No - Comment as needed  Activities of Daily Living Home Assistive Devices/Equipment: Eyeglasses ADL Screening (condition at time of admission) Patient's cognitive ability adequate to safely complete daily activities?: Yes Is the patient deaf or have difficulty hearing?: No Does the patient have difficulty seeing, even when wearing glasses/contacts?: No Does the patient have difficulty concentrating, remembering, or making decisions?: No Patient able to express need for assistance with ADLs?: Yes Does the patient have difficulty dressing or bathing?: No Independently performs ADLs?: Yes (appropriate for developmental age) Does the patient have difficulty walking or climbing stairs?: No Weakness of Legs: None Weakness of Arms/Hands: None  Permission Sought/Granted Permission sought to share information with : Other (comment) Permission granted to share information with : Yes, Verbal Permission Granted Share Information with NAME: Welford Roche Permission granted to share info w AGENCY: [place] granted to share info w Contact Information: [phone]  Emotional Assessment Appearance:: Appears stated age Attitude/Demeanor/Rapport: Engaged Affect (typically observed): Accepting Orientation: : Oriented to Self, Oriented to Place, Oriented  to  Time, Oriented to Situation Alcohol / Substance Use: Tobacco Use  Admission diagnosis:   Rectal cancer ([place]) [C20] Patient Active Problem List   Diagnosis Date Noted   Port-A-Cath in place 01/07/2021   Rectal cancer ([place]) 12/27/2020   Anemia 11/11/2020   Chronic diarrhea 10/06/2020   Screening for hypertension 08/26/2016   PCP:  System, Provider Not In Pharmacy:   [place], [place] [address] Phone: [phone] Fax: [phone]  [place] [address] Phone: [phone] Fax: [phone]  Readmission Risk Interventions No flowsheet data found.

## 2021-08-20 NOTE — Consult Note (Signed)
[place] Nurse ostomy follow up Stoma type/location: RLQ, loop ileostomy  Red rubber support rod removed today  Stomal assessment/size: prolapsed; pink, moist, stoma and os points downward into pouch Peristomal assessment: intact; some blistering near midline but tape of skin barrier has not been near this area and patient does not have any other dressings in that area. Will monitor Treatment options for stomal/peristomal skin: 2" skin barrier ring  Output liquid green Ostomy pouching: 2pc. 2 1/4" with 2" skin barrier ring Education provided:  Patient just got NG inserted and pain meds. He is engaged to learn but sleepy Assist me with emptying his pouch today; wishes to have sample pouch at bedside to practice lock and roll closure  Falling asleep during teaching.  Re measured stoma now that support rod removed 1 1/8" round Placed 2" skin barrier around stoma and new skin barrier, attached pouch. Patient observed, some statements indicate he may be a little delirious  TOC working with patient on housing, homeless situation.   Enrolled patient in [place] Start Discharge program: Yes   (4) 2 1/4" pouching systems in the room with barrier rings One of these placed on bedside table for patient to "practice" with.   [place] Nurse will follow along with you for continued support with ostomy teaching and care Chauncey MSN, RN, [place], [place], [place]

## 2021-08-20 NOTE — NC FL2 (Signed)
[place] LEVEL OF CARE SCREENING TOOL     IDENTIFICATION  Patient Name: Phillip Brooks Birthdate: January 11, 1956 Sex: male Admission Date (Current Location): 08/10/2021  [place] and [place] Number:  Kathleen Argue [id number] [place] and Address:  [facility],  [place], [place]      Provider Number: [id number]  Attending Physician Name and Address:  Leighton Ruff, MD  Relative Name and Phone Number:  Nussen Pullin (mother) Ph: [phone]    Current Level of Care: Hospital Recommended Level of Care: Other (Comment) (Rest home) Prior Approval Number:    Date Approved/Denied:   PASRR Number:    Discharge Plan: Domiciliary (Rest home)    Current Diagnoses: Patient Active Problem List   Diagnosis Date Noted   Port-A-Cath in place 01/07/2021   Rectal cancer ([place]) 12/27/2020   Anemia 11/11/2020   Chronic diarrhea 10/06/2020   Screening for hypertension 08/26/2016    Orientation RESPIRATION BLADDER Height & Weight     Self, Time, Situation, Place  Normal Continent Weight: 134 lb 7.7 oz (61 kg) Height:  5\' 6"  (167.6 cm)  BEHAVIORAL SYMPTOMS/MOOD NEUROLOGICAL BOWEL NUTRITION STATUS   (N/A)  (N/A) Ileostomy Diet (See discharge summary as patient is NPO following surgery.)  AMBULATORY STATUS COMMUNICATION OF NEEDS Skin   Limited Assist Verbally Surgical wounds, Other (Comment) (Blister: mid abdomen; ileostomy: right lower abdomen)                       Personal Care Assistance Level of Assistance  Bathing, Feeding, Dressing (Patient primarily needs supervision as he recuperates following surgery.) Bathing Assistance: Limited assistance Feeding assistance: Independent Dressing Assistance: Limited assistance     Functional Limitations Info  Sight, Hearing, Speech Sight Info: Impaired Hearing Info: Adequate Speech Info: Adequate    SPECIAL CARE FACTORS FREQUENCY  PT (By licensed PT)     PT Frequency: 3x's/week               Contractures Contractures Info: Not present    Additional Factors Info  Code Status, Allergies, Psychotropic Code Status Info: Full Allergies Info: NKA Psychotropic Info: Neurontin (gabapentin)         Current Medications (08/20/2021):  This is the current hospital active medication list Current Facility-Administered Medications  Medication Dose Route Frequency Provider Last Rate Last Admin   0.9 %  sodium chloride infusion   Intravenous Continuous Leighton Ruff, MD 75 mL/hr at 08/20/21 1114 Infusion Verify at 08/20/21 1114   acetaminophen (TYLENOL) tablet 1,000 mg  1,000 mg Oral W4Y Leighton Ruff, MD   6,599 mg at 08/19/21 2037   alum & mag hydroxide-simeth (MAALOX/MYLANTA) 200-200-20 MG/5ML suspension 30 mL  30 mL Oral J5T PRN Leighton Ruff, MD       Chlorhexidine Gluconate Cloth 2 % PADS 6 each  6 each Topical Daily Leighton Ruff, MD   6 each at 08/20/21 0937   diphenhydrAMINE (BENADRYL) 12.5 MG/5ML elixir 12.5 mg  12.5 mg Oral S1X PRN Leighton Ruff, MD       Or   diphenhydrAMINE (BENADRYL) injection 12.5 mg  12.5 mg Intravenous B9T PRN Leighton Ruff, MD       enoxaparin (LOVENOX) injection 40 mg  40 mg Subcutaneous J03E Leighton Ruff, MD   40 mg at 08/20/21 0942   feeding supplement (ENSURE SURGERY) liquid 237 mL  237 mL Oral BID BM Leighton Ruff, MD   092 mL at 08/17/21 1606   gabapentin (NEURONTIN) capsule 300 mg  300  mg Oral BID Leighton Ruff, MD   680 mg at 08/20/21 0936   HYDROmorphone (DILAUDID) injection 1 mg  1 mg Intravenous S8P PRN Leighton Ruff, MD   1 mg at 08/18/58 0938   ondansetron [facility]) tablet 4 mg  4 mg Oral Y5O PRN Leighton Ruff, MD   4 mg at 59/29/24 2016   Or   ondansetron [facility]) injection 4 mg  4 mg Intravenous M6K PRN Leighton Ruff, MD   4 mg at 86/38/17 0630   saccharomyces boulardii (FLORASTOR) capsule 250 mg  250 mg Oral BID Leighton Ruff, MD   711 mg at 08/20/21 0936   simethicone (MYLICON) chewable tablet 40 mg  40 mg Oral A5B PRN  Leighton Ruff, MD         Discharge Medications: Please see discharge summary for a list of discharge medications.  Relevant Imaging Results:  Relevant Lab Results:   Additional Information SSN: [id number]  Sherie Don, LCSW

## 2021-08-20 NOTE — Progress Notes (Signed)
5 Days Post-Op robotic LAR, diverting ostomy  Subjective/Chief Complaint: Patient is having less nausea  Pain is currently controlled  Objective: Vital signs in last 24 hours: Temp:  [97.9 F (36.6 C)-98.3 F (36.8 C)] 98.2 F (36.8 C) (11/02 0615) Pulse Rate:  [92-94] 92 (11/02 0615) Resp:  [17-18] 17 (11/02 0615) BP: (118-134)/(75-86) 127/84 (11/02 0615) SpO2:  [93 %-99 %] 98 % (11/02 0615) Last BM Date: 08/19/21 (has RLQ ileostomy)  Intake/Output from previous day: 11/01 0701 - 11/02 0700 In: 62 [P.O.:180; I.V.:400] Out: 1540 [Urine:1000; Emesis/NG output:100; Drains:40; Stool:400] Intake/Output this shift: No intake/output data recorded.  General appearance: alert, cooperative, and no distress GI: distended, tender in lower abdomen; ostomy pink with min some liquid stool Incisions OK, drain with serosanguinous drainage  Lab Results:  Recent Labs    08/19/21 0351 08/20/21 0320  WBC 9.0 10.1  HGB 10.1* 9.1*  HCT 32.0* 29.5*  PLT 423* 428*    BMET Recent Labs    08/19/21 0351 08/20/21 0320  NA 133* 133*  K 3.7 3.7  CL 97* 100  CO2 23 20*  GLUCOSE 88 87  BUN 27* 15  CREATININE 0.98 0.65  CALCIUM 9.0 8.5*    PT/INR No results for input(s): LABPROT, INR in the last 72 hours. ABG No results for input(s): PHART, HCO3 in the last 72 hours.  Invalid input(s): PCO2, PO2  Studies/Results: DG Abd Portable 1V  Result Date: 08/18/2021 CLINICAL DATA:  Vomiting, abdominal distention EXAM: PORTABLE ABDOMEN - 1 VIEW COMPARISON:  CT 12/10/2020 FINDINGS: Dilated small bowel loops are noted in the abdomen concerning for small bowel obstruction. Surgical drain in the pelvis. No free air or organomegaly. IMPRESSION: Dilated small bowel loops concerning for small bowel obstruction. Electronically Signed   By: Rolm Baptise M.D.   On: 08/18/2021 21:23    Anti-infectives: Anti-infectives (From admission, onward)    Start     Dose/Rate Route Frequency Ordered Stop    07/19/2021 2000  cefoTEtan (CEFOTAN) 2 g in sodium chloride 0.9 % 100 mL IVPB        2 g 200 mL/hr over 30 Minutes Intravenous Every 12 hours 08/14/2021 1647 07/28/2021 2046   07/20/2021 0600  cefoTEtan (CEFOTAN) 2 g in sodium chloride 0.9 % 100 mL IVPB        2 g 200 mL/hr over 30 Minutes Intravenous On call to O.R. 08/02/2021 9381 08/16/2021 0806       Assessment/Plan: s/p Procedure(s): XI ROBOTIC ASSISTED LOWER ANTERIOR RESECTION, WITH BILATERAL TAP BLOCK AND INTRAOPERATIVE ASSESSMENT USING ICG (N/A) DIVERTING ILEOSTOMY (N/A) CYSTOSCOPY with FIREFLY INJECTION AND URETHERAL DILATION (N/A) Expected post op Ileus: NPO until better bowel function, will check AXR and if no better today, will place NG Ambulate in hall, appreciate PT eval Dehydration due to N/V: Cr back to normal, UOP good-  Decrease IVF's Cont IV pain meds but will start to wean - Dilaudid 1 mg IV q3h PRN.  Will transition to PO meds when ileus resolves  Dispo: pt does not have a reliable housing option at discharge.  TOC team consulted.     LOS: 5 days    Phillip Brooks 82/06/9370

## 2021-08-20 NOTE — Progress Notes (Signed)
PT Cancellation Note  Patient Details Name: Phillip Brooks MRN: [id number] DOB: October 30, 1955   Cancelled Treatment:    Reason Eval/Treat Not Completed: Pain limiting ability to participate. Pt declines PT today, states pain/discomfort "everywhere", just wants to rest. Will check back tomorrow.    Talbot Grumbling PT, DPT 08/20/21, 2:16 PM

## 2021-08-20 NOTE — Care Management Important Message (Signed)
Important Message  Patient Details IM Letter given to the Patient. Name: Phillip Brooks MRN: [id number] Date of Birth: 1956-04-21   Medicare Important Message Given:  Yes     Kerin Salen 08/20/2021, 1:31 PM

## 2021-08-21 LAB — SURGICAL PATHOLOGY

## 2021-08-21 NOTE — TOC Progression Note (Signed)
Transition of Care [facility]) - Progression Note   Patient Details  Name: HOLLIE BARTUS MRN: [id number] Date of Birth: [birth year]  [facility]) CM/SW [place], LCSW Phone Number: 08/21/2021, 12:17 PM  Clinical Narrative: CSW met with patient to discuss his options for staying with family after discharge. CSW explained to patient that due to his current housing not being sanitary/safe, a [place] agency would not agree to come to the home to provided the needed services. Patient reported he is going to call his mother and his aunt, who lives in [place], to see if he can discharge to his aunt's home temporarily while he learns to manage his ostomy. TOC to follow up with patient after he speaks with family.  Expected Discharge Plan: [place] Barriers to Discharge: Continued Medical Work up  Expected Discharge Plan and Services Expected Discharge Plan: [place] In-house Referral: Clinical Social Work Living arrangements for the past 2 months: Single Family Home            DME Arranged: N/A DME Agency: NA  Readmission Risk Interventions No flowsheet data found.

## 2021-08-21 NOTE — Consult Note (Signed)
[place] Nurse ostomy follow up Stoma type/location: RLQ loop ileostomy Output : thin liquid effluent Ostomy pouching: 2pc.  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  Patient can perform, with cueing, but he is very slow. Taught that we will change pouch together in the morning. He requests for me to come before physical therapy as he has just walked with them and he is exhausted and cannot concentrate. Enrolled patient in [place] Discharge program: Yes  Next visit is tomorrow, early am (7:30-8am).   Supplies in room: 4 pouches, 4 skin barrier rings, 4 skin barriers  [place] nursing team will follow, and will remain available to this patient, the nursing and medical teams.   Maudie Flakes, MSN, RN, [place], [place]  Pager# [phone]

## 2021-08-21 NOTE — Progress Notes (Signed)
6 Days Post-Op robotic LAR, diverting ostomy  Subjective/Chief Complaint: NG placed yesterday with sig output  Pain is currently controlled Ambulated in hall per pt  Objective: Vital signs in last 24 hours: Temp:  [98.4 F (36.9 C)-99.1 F (37.3 C)] 98.9 F (37.2 C) (11/03 0708) Pulse Rate:  [89-91] 89 (11/03 0708) Resp:  [16-18] 16 (11/03 0708) BP: (126-138)/(72-83) 126/72 (11/03 0708) SpO2:  [98 %-99 %] 98 % (11/03 0708) Last BM Date: 08/20/21  Intake/Output from previous day: 11/02 0701 - 11/03 0700 In: 2036.2 [P.O.:130; I.V.:1906.2] Out: 4000 [Urine:875; Emesis/NG output:2750; Stool:375] Intake/Output this shift: No intake/output data recorded.  General appearance: alert, cooperative, and no distress GI: less distended, less tender in lower abdomen; ostomy pink with min some liquid stool Incisions OK, drain with serosanguinous drainage NG with clear output Lab Results:  Recent Labs    08/19/21 0351 08/20/21 0320  WBC 9.0 10.1  HGB 10.1* 9.1*  HCT 32.0* 29.5*  PLT 423* 428*    BMET Recent Labs    08/19/21 0351 08/20/21 0320  NA 133* 133*  K 3.7 3.7  CL 97* 100  CO2 23 20*  GLUCOSE 88 87  BUN 27* 15  CREATININE 0.98 0.65  CALCIUM 9.0 8.5*    PT/INR No results for input(s): LABPROT, INR in the last 72 hours. ABG No results for input(s): PHART, HCO3 in the last 72 hours.  Invalid input(s): PCO2, PO2  Studies/Results: DG Abd 1 View  Result Date: 08/20/2021 CLINICAL DATA:  Nasogastric tube placement. EXAM: ABDOMEN - 1 VIEW COMPARISON:  None. FINDINGS: NG tube tip is at the level of the mid body of the stomach. There is some prominent air-filled loops of small bowel in the upper abdomen. Lung bases are clear. No acute fractures. Central venous catheter tip projects over the mid SVC. IMPRESSION: 1. Nasogastric tube tip in the mid stomach. Electronically Signed   By: Ronney Asters M.D.   On: 08/20/2021 15:39   DG Abd Portable 2V  Result Date:  08/20/2021 CLINICAL DATA:  Abdominal distention EXAM: PORTABLE ABDOMEN - 2 VIEW COMPARISON:  08/18/2021 FINDINGS: There is abnormal dilation of small bowel loops. Dilated small bowel loops measure up to 5.1 cm in diameter. There are multiple air-fluid levels in the small bowel loops. There is no definite demonstrable pneumoperitoneum along the margin of the liver in the decubitus view. Surgical drain is seen in right pelvic cavity. Surgical staples are seen in the pelvis in the region of rectum. There is air in the soft tissues of anterior abdominal wall from recent surgery. IMPRESSION: There is no significant interval change in abnormal dilation of small-bowel loops with air-fluid levels suggesting high-grade partial obstruction. Electronically Signed   By: Elmer Picker M.D.   On: 08/20/2021 10:33    Anti-infectives: Anti-infectives (From admission, onward)    Start     Dose/Rate Route Frequency Ordered Stop   08/02/2021 2000  cefoTEtan (CEFOTAN) 2 g in sodium chloride 0.9 % 100 mL IVPB        2 g 200 mL/hr over 30 Minutes Intravenous Every 12 hours 08/02/2021 1647 07/23/2021 2046   07/25/2021 0600  cefoTEtan (CEFOTAN) 2 g in sodium chloride 0.9 % 100 mL IVPB        2 g 200 mL/hr over 30 Minutes Intravenous On call to O.R. 07/30/2021 0513 08/08/2021 0806       Assessment/Plan: s/p Procedure(s): XI ROBOTIC ASSISTED LOWER ANTERIOR RESECTION, WITH BILATERAL TAP BLOCK AND INTRAOPERATIVE ASSESSMENT USING ICG (N/A)  DIVERTING ILEOSTOMY (N/A) CYSTOSCOPY with FIREFLY INJECTION AND URETHERAL DILATION (N/A) Expected post op Ileus: NPO until better bowel function, ok for sips of clears, cont to monitor ostomy output Ambulate in hall, appreciate PT eval Dehydration due to N/V: Cr back to normal, UOP good-  Cont IVF's until pt tolerating PO Cont IV pain meds but will start to wean - Dilaudid 1 mg IV q3h PRN.  Will transition to PO meds when ileus resolves  Dispo: pt does not have a reliable housing option  at discharge.  TOC team consulted and working with housing authority to assist with d/c planning.     LOS: 6 days    Phillip Brooks 50/04/5731

## 2021-08-21 NOTE — Progress Notes (Signed)
Physical Therapy Treatment Patient Details Name: Phillip Brooks MRN: [id number] DOB: [birth year] Today's Date: 08/21/2021   History of Present Illness Patient is 65 y.o. male s/p robotic LAR, diverting ostomy on 08/09/2021 for advanced mid rectal cancer.    PT Comments    Pt ambulated 105' with RW without loss of balance. Pt requires increased time for all aspects of mobility 2* RLQ pain with movement. He tolerated increased ambulation distance today.     Recommendations for follow up therapy are one component of a multi-disciplinary discharge planning process, led by the attending physician.  Recommendations may be updated based on patient status, additional functional criteria and insurance authorization.  Follow Up Recommendations  Home health PT     Assistance Recommended at Discharge Intermittent Supervision/Assistance  Equipment Recommendations  Rolling walker (2 wheels)    Recommendations for Other Services       Precautions / Restrictions Precautions Precautions: Fall Precaution Comments: abdominal surgery, NG tube, R iliostomy, R JP drain Restrictions Weight Bearing Restrictions: No     Mobility  Bed Mobility Overal bed mobility: Modified Independent Bed Mobility: Supine to Sit     Supine to sit: HOB elevated;Modified independent (Device/Increase time)     General bed mobility comments: Pt educated on log rolling, but politely declined and requested HOB be raised fully to sit EOB. Increased time.    Transfers Overall transfer level: Needs assistance Equipment used: Rolling walker (2 wheels) Transfers: Sit to/from Stand Sit to Stand: Supervision;From elevated surface           General transfer comment: increased time, VCs hand placement    Ambulation/Gait Ambulation/Gait assistance: Supervision Gait Distance (Feet): 105 Feet Assistive device: Rolling walker (2 wheels) Gait Pattern/deviations: Step-through pattern;Decreased stride length;Trunk  flexed Gait velocity: decr   General Gait Details: increased time, no loss of balance, trunk flexed 2* abdominal pain   Stairs             Wheelchair Mobility    Modified Rankin (Stroke Patients Only)       Balance Overall balance assessment: Needs assistance Sitting-balance support: Feet supported Sitting balance-Leahy Scale: Good     Standing balance support: During functional activity;Reliant on assistive device for balance;Bilateral upper extremity supported Standing balance-Leahy Scale: Poor Standing balance comment: reliance on RW for support                            Cognition Arousal/Alertness: Awake/alert Behavior During Therapy: WFL for tasks assessed/performed Overall Cognitive Status: Within Functional Limits for tasks assessed                                 General Comments: pt somewhat tangental in conversation, very pleasant, seems to enjoy joking.        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 PT Goal Formulation: With patient Time For Goal Achievement: 09/02/21 Potential to Achieve Goals: Good Progress towards PT goals: Progressing toward goals    Frequency    Min 3X/week      PT Plan Current plan remains appropriate    Co-evaluation              AM-PAC PT "6 Clicks" Mobility   Outcome Measure  Help needed turning from your back to your side while in a flat bed without using bedrails?: A Little Help needed moving  from lying on your back to sitting on the side of a flat bed without using bedrails?: A Little Help needed moving to and from a bed to a chair (including a wheelchair)?: A Little Help needed standing up from a chair using your arms (e.g., wheelchair or bedside chair)?: A Little Help needed to walk in hospital room?: A Little Help needed climbing 3-5 steps with a railing? : A Little 6 Click Score: 18    End of Session Equipment Utilized During Treatment: Gait belt Activity Tolerance: Patient tolerated treatment well Patient left: in chair;with call bell/phone within reach;with chair alarm set Nurse Communication: Mobility status PT Visit Diagnosis: Muscle weakness (generalized) (M62.81);Difficulty in walking, not elsewhere classified (R26.2);Unsteadiness on feet (R26.81);Pain     Time: 9323-5573 PT Time Calculation (min) (ACUTE ONLY): 39 min  Charges:  $Gait Training: 23-37 mins $Therapeutic Activity: 8-22 mins                     Blondell Reveal Kistler PT 08/21/2021  Acute Rehabilitation Services Pager [phone] Office [phone]

## 2021-08-22 ENCOUNTER — Inpatient Hospital Stay (HOSPITAL_COMMUNITY): Payer: Medicare HMO

## 2021-08-22 DIAGNOSIS — C189 Malignant neoplasm of colon, unspecified: Secondary | ICD-10-CM | POA: Diagnosis not present

## 2021-08-22 DIAGNOSIS — K6389 Other specified diseases of intestine: Secondary | ICD-10-CM | POA: Diagnosis not present

## 2021-08-22 DIAGNOSIS — N3289 Other specified disorders of bladder: Secondary | ICD-10-CM | POA: Diagnosis not present

## 2021-08-22 DIAGNOSIS — Z4659 Encounter for fitting and adjustment of other gastrointestinal appliance and device: Secondary | ICD-10-CM | POA: Diagnosis not present

## 2021-08-22 DIAGNOSIS — Z872 Personal history of diseases of the skin and subcutaneous tissue: Secondary | ICD-10-CM | POA: Diagnosis not present

## 2021-08-22 LAB — BASIC METABOLIC PANEL
Anion gap: 12 (ref 5–15)
BUN: 8 mg/dL (ref 8–23)
CO2: 24 mmol/L (ref 22–32)
Calcium: 8.3 mg/dL — ABNORMAL LOW (ref 8.9–10.3)
Chloride: 105 mmol/L (ref 98–111)
Creatinine, Ser: 0.63 mg/dL (ref 0.61–1.24)
GFR, Estimated: >60 mL/min (ref >60)
Glucose, Bld: 88 mg/dL (ref 70–99)
Potassium: 3.2 mmol/L — ABNORMAL LOW (ref 3.5–5.1)
Sodium: 141 mmol/L (ref 135–145)

## 2021-08-22 LAB — CBC
HCT: 26.7 % — ABNORMAL LOW (ref 39.0–52.0)
Hemoglobin: 8.3 g/dL — ABNORMAL LOW (ref 13.0–17.0)
MCH: 28.1 pg (ref 26.0–34.0)
MCHC: 31.1 g/dL (ref 30.0–36.0)
MCV: 90.5 fL (ref 80.0–100.0)
Platelets: 485 10*3/uL — ABNORMAL HIGH (ref 150–400)
RBC: 2.95 MIL/uL — ABNORMAL LOW (ref 4.22–5.81)
RDW: 14.6 % (ref 11.5–15.5)
WBC: 13.7 10*3/uL — ABNORMAL HIGH (ref 4.0–10.5)
nRBC: 0 % (ref 0.0–0.2)

## 2021-08-22 MED ORDER — IOHEXOL 9 MG/ML PO SOLN
ORAL | Status: AC
  Filled 2021-08-22: qty 1000

## 2021-08-22 MED ORDER — IOHEXOL 9 MG/ML PO SOLN
500.0000 mL | ORAL | Status: AC
Start: 2021-08-22 — End: 2021-08-22
  Administered 2021-08-22 (×2): 500 mL via ORAL

## 2021-08-22 MED ORDER — POTASSIUM CHLORIDE 2 MEQ/ML IV SOLN: INTRAVENOUS | Status: DC

## 2021-08-22 MED ORDER — KCL IN DEXTROSE-NACL 40-5-0.45 MEQ/L-%-% IV SOLN
INTRAVENOUS | Status: AC
  Filled 2021-08-22 (×3): qty 1000

## 2021-08-22 MED ORDER — IOHEXOL 350 MG/ML SOLN
80.0000 mL | Freq: Once | INTRAVENOUS | Status: AC | PRN
  Administered 2021-08-22: 80 mL via INTRAVENOUS

## 2021-08-22 NOTE — Progress Notes (Signed)
PHARMACY - TOTAL PARENTERAL NUTRITION CONSULT NOTE   Indication: Prolonged ileus  Patient Measurements: Height: 5\' 6"  (167.6 cm) Weight: 58.9 kg (129 lb 13.6 oz) IBW/kg (Calculated) : 63.8 TPN AdjBW (KG): 59.7 Body mass index is 20.96 kg/m.  Assessment:  65 yo M presents on 10/28 for eval of rectal cancer. S/p cystoscopy and diverting ileostomy.  Has been NPO for 7 days and NG tube in place with high output. Pharmacy consulted to start TPN.  Glucose / Insulin: CBGs controlled Electrolytes: K low at 3.2 but started on fluids with KCl Renal: UOP ok Hepatic:  Intake / Output; MIVF: D5 1/2 NS w 66mEq KCl @ 19ml/hr. NG output ~2.5 L in past 24 hrs. Drain output minimal.  GI Imaging: 11/4 CT abdomen shows fluid collection, SBO, bladder thickening  GI Surgeries / Procedures:  10/28: Diverting ileostomy   Central access: Implanted Port TPN start date: 11/5  Nutritional Goals: Goal TPN rate is * mL/hr (provides * g of protein and * kcals per day)  RD Assessment:    Current Nutrition:  NPO, ok for ice chips  Plan:  Will plan to start TPN on 11/5 TPN labs ordered F/U RD recs  Elenor Quinones, PharmD, BCPS, BCIDP Clinical Pharmacist 08/22/2021 7:07 PM

## 2021-08-22 NOTE — Consult Note (Signed)
Old Field Nurse ostomy follow up Stoma type/location: RLQ ileostomy Stomal assessment/size: 1 and 1/8 inch round, red, moist, slightly prolapsed/long Peristomal assessment: not seen today, pouch was changed either overnight or late last night, patient cannot recall and documentation is not clear. Treatment options for stomal/peristomal skin: none Output: thin, green liquid. 163mls empties from pouch today during visit Ostomy pouching: 2pc. Pouching system in place (2 and 3/4 inch). A 2 and 1/4 inch pouching system is indicated. Supplies in room for discharge, 4 skin barriers, 4 pouches, 4 skin barrier rings. Education provided:  Session used today to practice pouch emptying. Patient is initially reluctant, but agrees. Able to unroll Lock and Roll Closure with cueing, drain pouch into graduate, I clean bottom of tail closure and patient re-rolls Lock and roll tail closure. Patient is very focused on being able to get NG tube out, says he cannot focus on ostomy today. Is appearing slightly more frail than yesterday. Will plan for visit on Monday for pouch change.   Enrolled patient in [facility] Start Discharge program: Yes, previously  [place] nursing team will not follow, but will remain available to this patient, the nursing and medical teams.  Please re-consult if needed. Thanks, Maudie Flakes, MSN, RN, [place], Arther Abbott  Pager# [phone]

## 2021-08-22 NOTE — Plan of Care (Signed)
  Problem: Clinical Measurements: Goal: Ability to maintain clinical measurements within normal limits will improve Outcome: Progressing   Problem: Clinical Measurements: Goal: Will remain free from infection Outcome: Progressing   Problem: Nutrition: Goal: Adequate nutrition will be maintained Outcome: Progressing   Problem: Coping: Goal: Level of anxiety will decrease Outcome: Progressing

## 2021-08-22 NOTE — Progress Notes (Signed)
Occupational Therapy Treatment Patient Details Name: Phillip Brooks MRN: [id number] DOB: 08/11/1956 Today's Date: 08/22/2021   History of present illness Patient is 65 y.o. male s/p robotic LAR, diverting ostomy on 07/20/2021 for advanced mid rectal cancer.   OT comments  Pt performed functional mobility house distance in hallway this session with min guard and RW. Pt refusing education on LB dressing AE this session due to wanting to walk instead. Pt educated on use of LB AE and log rolling to decrease bending at abdomen and for  pain management, pt declining and reports he would like to cover that in the next visit despite continued abdominal pain. Pt found to have leakage from abdominal incision, RN made aware. Increased time for education on compensatory strategies, pt tangential and resistant to education. Continuing to recommend [place] post d/c, will continue to follow in the acute setting.    Recommendations for follow up therapy are one component of a multi-disciplinary discharge planning process, led by the attending physician.  Recommendations may be updated based on patient status, additional functional criteria and insurance authorization.    Follow Up Recommendations  Home health OT    Assistance Recommended at Discharge Intermittent Supervision/Assistance  Equipment Recommendations  Other (comment) (TBD)    Recommendations for Other Services      Precautions / Restrictions Precautions Precautions: Fall Precaution Comments: abdominal surgery, NG tube, R iliostomy, R JP drain Restrictions Weight Bearing Restrictions: No       Mobility Bed Mobility Overal bed mobility: Modified Independent Bed Mobility: Supine to Sit     Supine to sit: HOB elevated;Modified independent (Device/Increase time)     General bed mobility comments: Pt educated on importance of log rolling, but refused at this time and requested HOB be raised fully to sit EOB. Increased time.     Transfers Overall transfer level: Needs assistance Equipment used: Rolling walker (2 wheels) Transfers: Sit to/from Stand Sit to Stand: Min guard           General transfer comment: increased time     Balance Overall balance assessment: Needs assistance Sitting-balance support: Feet supported Sitting balance-Leahy Scale: Good     Standing balance support: During functional activity;Reliant on assistive device for balance;Bilateral upper extremity supported Standing balance-Leahy Scale: Poor Standing balance comment: reliance on RW for support                           ADL either performed or assessed with clinical judgement   ADL Overall ADL's : Needs assistance/impaired                                     Functional mobility during ADLs: Min guard;Rolling walker (2 wheels)       Vision       Perception     Praxis      Cognition Arousal/Alertness: Awake/alert Behavior During Therapy: WFL for tasks assessed/performed Overall Cognitive Status: Within Functional Limits for tasks assessed                                 General Comments: pt tangential in conversation          Exercises     Shoulder Instructions       General Comments      Pertinent Vitals/ Pain  Facial Expression: facial grimacing Pain Location: abdomen Pain Descriptors / Indicators: Grimacing;Discomfort Pain Intervention(s): Limited activity within patient's tolerance;Monitored during session  Home Living                                          Prior Functioning/Environment              Frequency  Min 2X/week        Progress Toward Goals  OT Goals(current goals can now be found in the care plan section)  Progress towards OT goals: OT to reassess next treatment  Acute Rehab OT Goals Patient Stated Goal: to go home with therapy OT Goal Formulation: With patient Time For Goal Achievement:  09/03/21 Potential to Achieve Goals: Good ADL Goals Pt Will Perform Grooming: standing;with modified independence Pt Will Perform Lower Body Dressing: with modified independence;sit to/from stand Pt Will Transfer to Toilet: with modified independence;regular height toilet Pt Will Perform Toileting - Clothing Manipulation and hygiene: with modified independence;sit to/from stand  Plan Discharge plan remains appropriate    Co-evaluation                 AM-PAC OT "6 Clicks" Daily Activity     Outcome Measure   Help from another person eating meals?: None Help from another person taking care of personal grooming?: A Little Help from another person toileting, which includes using toliet, bedpan, or urinal?: A Little Help from another person bathing (including washing, rinsing, drying)?: A Lot Help from another person to put on and taking off regular upper body clothing?: A Little Help from another person to put on and taking off regular lower body clothing?: A Lot 6 Click Score: 17    End of Session Equipment Utilized During Treatment: Gait belt;Rolling walker (2 wheels)  OT Visit Diagnosis: Pain;Muscle weakness (generalized) (M62.81)   Activity Tolerance Patient tolerated treatment well   Patient Left in chair;with chair alarm set;with call bell/phone within reach   Nurse Communication Other (comment) (pt with fluid leaking out of abdominal incision)        Time: 7829-5621 OT Time Calculation (min): 43 min  Charges: OT General Charges $OT Visit: 1 Visit OT Treatments $Therapeutic Activity: 38-52 mins  Jackquline Denmark, OTS Acute Rehab Office: [phone]   Freeman Borba 08/22/2021, 4:09 PM

## 2021-08-22 NOTE — Progress Notes (Signed)
Pt's incision site was leaking, informed Dr. Marcello Moores. It is normal as per Dr. Marcello Moores. Will continue to monitor.

## 2021-08-22 NOTE — Progress Notes (Addendum)
7 Days Post-Op robotic LAR, diverting ostomy  Subjective/Chief Complaint: NG in place with clear output  Pain is currently controlled Ambulated in hall per pt  Objective: Vital signs in last 24 hours: Temp:  [98.2 F (36.8 C)-99.1 F (37.3 C)] 98.2 F (36.8 C) (11/04 0444) Pulse Rate:  [88-96] 88 (11/04 0444) Resp:  [18] 18 (11/04 0444) BP: (116-127)/(73-78) 116/73 (11/04 0444) SpO2:  [97 %-98 %] 98 % (11/04 0444) Weight:  [58.9 kg] 58.9 kg (11/04 0444) Last BM Date: 08/21/21  Intake/Output from previous day: 11/03 0701 - 11/04 0700 In: 4192.8 [P.O.:2180; I.V.:2012.8] Out: 5410 [Urine:950; Emesis/NG output:4100; Drains:10; Stool:350] Intake/Output this shift: No intake/output data recorded.  General appearance: alert, cooperative, and no distress GI: less distended, less tender in lower abdomen; ostomy pink with min some liquid stool Incisions OK, drain with serosanguinous drainage NG with clear output Lab Results:  Recent Labs    08/20/21 0320 08/22/21 0318  WBC 10.1 13.7*  HGB 9.1* 8.3*  HCT 29.5* 26.7*  PLT 428* 485*    BMET Recent Labs    08/20/21 0320 08/22/21 0318  NA 133* 141  K 3.7 3.2*  CL 100 105  CO2 20* 24  GLUCOSE 87 88  BUN 15 8  CREATININE 0.65 0.63  CALCIUM 8.5* 8.3*    PT/INR No results for input(s): LABPROT, INR in the last 72 hours. ABG No results for input(s): PHART, HCO3 in the last 72 hours.  Invalid input(s): PCO2, PO2  Studies/Results: DG Abd 1 View  Result Date: 08/20/2021 CLINICAL DATA:  Nasogastric tube placement. EXAM: ABDOMEN - 1 VIEW COMPARISON:  None. FINDINGS: NG tube tip is at the level of the mid body of the stomach. There is some prominent air-filled loops of small bowel in the upper abdomen. Lung bases are clear. No acute fractures. Central venous catheter tip projects over the mid SVC. IMPRESSION: 1. Nasogastric tube tip in the mid stomach. Electronically Signed   By: Ronney Asters M.D.   On: 08/20/2021 15:39     Anti-infectives: Anti-infectives (From admission, onward)    Start     Dose/Rate Route Frequency Ordered Stop   07/24/2021 2000  cefoTEtan (CEFOTAN) 2 g in sodium chloride 0.9 % 100 mL IVPB        2 g 200 mL/hr over 30 Minutes Intravenous Every 12 hours 08/03/2021 1647 07/28/2021 2046   08/04/2021 0600  cefoTEtan (CEFOTAN) 2 g in sodium chloride 0.9 % 100 mL IVPB        2 g 200 mL/hr over 30 Minutes Intravenous On call to O.R. 08/05/2021 4097 08/09/2021 0806       Assessment/Plan: s/p Procedure(s): XI ROBOTIC ASSISTED LOWER ANTERIOR RESECTION, WITH BILATERAL TAP BLOCK AND INTRAOPERATIVE ASSESSMENT USING ICG (N/A) DIVERTING ILEOSTOMY (N/A) CYSTOSCOPY with FIREFLY INJECTION AND URETHERAL DILATION (N/A) Expected post op Ileus: NPO until better bowel function, ok for sips of clears, cont to monitor ostomy output Elevated WBC, purulence in JP: will check CT today to evaluate for appropriate drainage of anticipated peilvic infection due to his perforated rectal cancer Ambulate in hall, appreciate PT eval Dehydration due to N/V: Cr back to normal, UOP good-  Cont IVF's until pt tolerating PO Cont IV pain meds but will start to wean - Dilaudid 1 mg IV q3h PRN.  Will transition to PO meds when ileus resolves Urinary retention: foley replaced on Mon.  Cont this over the weekend with plans for attempt at removal again on Sun Dispo: pt does not have a  reliable housing option at discharge.  TOC team consulted and working with housing authority to assist with d/c planning.  Pt to make temporary housing plans     LOS: 7 days    Rosario Adie 25/05/3461

## 2021-08-23 LAB — CBC
HCT: 27.3 % — ABNORMAL LOW (ref 39.0–52.0)
Hemoglobin: 8.6 g/dL — ABNORMAL LOW (ref 13.0–17.0)
MCH: 28.1 pg (ref 26.0–34.0)
MCHC: 31.5 g/dL (ref 30.0–36.0)
MCV: 89.2 fL (ref 80.0–100.0)
Platelets: 578 10*3/uL — ABNORMAL HIGH (ref 150–400)
RBC: 3.06 MIL/uL — ABNORMAL LOW (ref 4.22–5.81)
RDW: 14.8 % (ref 11.5–15.5)
WBC: 14.2 10*3/uL — ABNORMAL HIGH (ref 4.0–10.5)
nRBC: 0 % (ref 0.0–0.2)

## 2021-08-23 LAB — COMPREHENSIVE METABOLIC PANEL
ALT: 6 U/L (ref 0–44)
AST: 10 U/L — ABNORMAL LOW (ref 15–41)
Albumin: 2.4 g/dL — ABNORMAL LOW (ref 3.5–5.0)
Alkaline Phosphatase: 46 U/L (ref 38–126)
Anion gap: 11 (ref 5–15)
BUN: 6 mg/dL — ABNORMAL LOW (ref 8–23)
CO2: 26 mmol/L (ref 22–32)
Calcium: 8.4 mg/dL — ABNORMAL LOW (ref 8.9–10.3)
Chloride: 102 mmol/L (ref 98–111)
Creatinine, Ser: 0.62 mg/dL (ref 0.61–1.24)
GFR, Estimated: >60 mL/min (ref >60)
Glucose, Bld: 107 mg/dL — ABNORMAL HIGH (ref 70–99)
Potassium: 3.5 mmol/L (ref 3.5–5.1)
Sodium: 139 mmol/L (ref 135–145)
Total Bilirubin: 1.1 mg/dL (ref 0.3–1.2)
Total Protein: 6.5 g/dL (ref 6.5–8.1)

## 2021-08-23 LAB — GLUCOSE, CAPILLARY: Glucose-Capillary: 136 mg/dL — ABNORMAL HIGH (ref 70–99)

## 2021-08-23 LAB — PHOSPHORUS: Phosphorus: 2.5 mg/dL (ref 2.5–4.6)

## 2021-08-23 LAB — TRIGLYCERIDES: Triglycerides: 118 mg/dL (ref <150)

## 2021-08-23 LAB — MAGNESIUM: Magnesium: 1.8 mg/dL (ref 1.7–2.4)

## 2021-08-23 MED ORDER — SODIUM CHLORIDE 0.9 % IV SOLN: INTRAVENOUS | Status: DC | PRN

## 2021-08-23 MED ORDER — POTASSIUM CHLORIDE 10 MEQ/100ML IV SOLN
10.0000 meq | INTRAVENOUS | Status: AC
  Administered 2021-08-23 (×3): 10 meq via INTRAVENOUS
  Filled 2021-08-23 (×3): qty 100

## 2021-08-23 MED ORDER — TRAVASOL 10 % IV SOLN
INTRAVENOUS | Status: AC
  Filled 2021-08-23: qty 480

## 2021-08-23 MED ORDER — KCL IN DEXTROSE-NACL 40-5-0.45 MEQ/L-%-% IV SOLN
INTRAVENOUS | Status: AC
  Filled 2021-08-23: qty 1000

## 2021-08-23 MED ORDER — INSULIN ASPART 100 UNIT/ML IJ SOLN
0.0000 [IU] | Freq: Four times a day (QID) | INTRAMUSCULAR | Status: DC
  Administered 2021-08-23 – 2021-08-27 (×13): 1 [IU] via SUBCUTANEOUS

## 2021-08-23 MED ORDER — SODIUM CHLORIDE 0.9% FLUSH
10.0000 mL | INTRAVENOUS | Status: DC | PRN
  Administered 2021-08-30 – 2021-10-28 (×7): 10 mL

## 2021-08-23 MED ORDER — MAGNESIUM SULFATE 2 GM/50ML IV SOLN
2.0000 g | Freq: Once | INTRAVENOUS | Status: AC
  Administered 2021-08-23: 2 g via INTRAVENOUS
  Filled 2021-08-23: qty 50

## 2021-08-23 MED ORDER — POTASSIUM CHLORIDE 10 MEQ/100ML IV SOLN
10.0000 meq | Freq: Once | INTRAVENOUS | Status: AC
  Administered 2021-08-23: 10 meq via INTRAVENOUS
  Filled 2021-08-23: qty 100

## 2021-08-23 NOTE — Plan of Care (Signed)
  Problem: Education: Goal: Knowledge of General Education information will improve Description Including pain rating scale, medication(s)/side effects and non-pharmacologic comfort measures Outcome: Progressing   

## 2021-08-23 NOTE — Progress Notes (Signed)
8 Days Post-Op robotic LAR, diverting ostomy  Subjective/Chief Complaint: NG in place with clear output  Pain is currently controlled Ambulated in hall per pt several times and was OOB in chair.  Does not like NGT. Complains of issues swallowing.   Objective: Vital signs in last 24 hours: Temp:  [98.2 F (36.8 C)-98.9 F (37.2 C)] 98.2 F (36.8 C) (11/05 0537) Pulse Rate:  [97-98] 98 (11/05 0537) Resp:  [16-18] 16 (11/05 0537) BP: (105-123)/(71-80) 123/71 (11/05 0537) SpO2:  [98 %-99 %] 98 % (11/05 0537) Last BM Date: 08/23/21 (Ileostomy)  Intake/Output from previous day: 11/04 0701 - 11/05 0700 In: 1353 [I.V.:1353] Out: 4680 [Urine:1300; Emesis/NG output:3200; Drains:5; Stool:175] Intake/Output this shift: Total I/O In: 269.9 [I.V.:269.9] Out: 100 [Stool:100]  General appearance: alert, cooperative, and no distress GI: non distended, approp tender on incision.  ostomy pink with some stool and gas in bag.  Incisions OK, drain with serosanguinous drainage  Lab Results:  Recent Labs    08/22/21 0318 08/23/21 0311  WBC 13.7* 14.2*  HGB 8.3* 8.6*  HCT 26.7* 27.3*  PLT 485* 578*   BMET Recent Labs    08/22/21 0318 08/23/21 0311  NA 141 139  K 3.2* 3.5  CL 105 102  CO2 24 26  GLUCOSE 88 107*  BUN 8 6*  CREATININE 0.63 0.62  CALCIUM 8.3* 8.4*   PT/INR No results for input(s): LABPROT, INR in the last 72 hours. ABG No results for input(s): PHART, HCO3 in the last 72 hours.  Invalid input(s): PCO2, PO2  Studies/Results: CT ABDOMEN PELVIS W CONTRAST  Result Date: 08/22/2021 CLINICAL DATA:  Colon cancer. Status post resection. Follow-up intra-abdominal abscess. EXAM: CT ABDOMEN AND PELVIS WITH CONTRAST TECHNIQUE: Multidetector CT imaging of the abdomen and pelvis was performed using the standard protocol following bolus administration of intravenous contrast. CONTRAST:  41mL OMNIPAQUE IOHEXOL 350 MG/ML SOLN COMPARISON:  CT chest abdomen and pelvis 12/10/2020.  FINDINGS: Lower chest: There are bands of atelectasis in the bilateral lower lobes. Hepatobiliary: Gallbladder sludge versus excreted contrast in the gallbladder. There is no bile duct dilatation. The liver is within normal limits. Pancreas: Unremarkable. No pancreatic ductal dilatation or surrounding inflammatory changes. Spleen: Normal in size without focal abnormality. Adrenals/Urinary Tract: A the adrenal glands and kidneys are within normal limits bilaterally. There is broader wall thickening diffusely. Air is seen in the bladder likely related to for catheter placement. Stomach/Bowel: Patient is status post sigmoid colon resection with new right-sided ileostomy. On the sagittal view there is an area concerning for wall defect along the posterior margin of the surgical anastomosis image 9/55. There is an air-fluid collection in the presacral region abutting the surgical anastomosis measuring 5.1 x 5.5 x 3.3 cm. Percutaneous drainage catheter is present in this region along the right lateral margin. There remaining colon is nondilated. The appendix is not seen. There are small bowel loops in the right lower quadrant demonstrating wall thickening and mesenteric edema. Small bowel loops in the left abdomen are fluid-filled and dilated measuring up to 4 cm. Stomach is moderately distended with contrast. Nasogastric tube tip is in the body of the stomach. Vascular/Lymphatic: No significant vascular findings are present. No enlarged abdominal or pelvic lymph nodes. Reproductive: Prostate gland is prominent in size, unchanged. Other: There is free air in the intramuscular and subcutaneous anterior abdominal wall compatible with recent surgery. There is diffuse body wall edema. There are no fluid collections in the abdominal wall. Musculoskeletal: Degenerative changes affect the spine. IMPRESSION:  1. Status post sigmoid colon resection. There findings worrisome for discontinuity of the posterior bowel wall the level of  the anastomosis. There is an air-fluid collection at this level measuring 5.1 x 5.3 x 3.3 cm which may represent abscess or sequelae from perforation. Surgical drain is seen at this level. 2. Findings compatible with small-bowel obstruction. Definitive transition point not visualize, but likely in the mid small bowel. 3. Right-sided ileostomy appears unremarkable. 4. Bladder wall thickening concerning for cystitis. Foley catheter in place. 5. Wall thickening of small bowel loops in the pelvis which may be secondary to reactive enteritis. This does not appear to be the transition for small bowel obstruction. 6. Nasogastric tube tip in the mid stomach. 7. Body wall edema. 8. These results were called by telephone at the time of interpretation on 08/22/2021 at 5:33 pm to provider Dr. [name], Who verbally acknowledged these results. Electronically Signed   By: Ronney Asters M.D.   On: 08/22/2021 17:34    Anti-infectives: Anti-infectives (From admission, onward)    Start     Dose/Rate Route Frequency Ordered Stop   08/14/2021 2000  cefoTEtan (CEFOTAN) 2 g in sodium chloride 0.9 % 100 mL IVPB        2 g 200 mL/hr over 30 Minutes Intravenous Every 12 hours 07/25/2021 1647 07/24/2021 2046   08/06/2021 0600  cefoTEtan (CEFOTAN) 2 g in sodium chloride 0.9 % 100 mL IVPB        2 g 200 mL/hr over 30 Minutes Intravenous On call to O.R. 08/10/2021 4163 08/12/2021 0806       Assessment/Plan: s/p Procedure(s): XI ROBOTIC ASSISTED LOWER ANTERIOR RESECTION, WITH BILATERAL TAP BLOCK AND INTRAOPERATIVE ASSESSMENT USING ICG (N/A) DIVERTING ILEOSTOMY (N/A) CYSTOSCOPY with FIREFLY INJECTION AND URETHERAL DILATION (N/A)  Expected post op Ileus: improving.  Keep NPO other than iced chips with NGT given leak.   Repeat Ct concerning for leak.   Ambulate in hall, appreciate PT eval Dehydration due to N/V: Cr back to normal, UOP good-  Cont IVF's until pt tolerating PO Cont IV pain meds but will start to wean - Dilaudid 1 mg  IV q3h PRN.  Will transition to PO meds when ileus resolves Urinary retention: foley replaced on Mon.  Cont this over the weekend with plans for attempt at removal again on Sun Dispo: pt does not have a reliable housing option at discharge.  TOC team consulted and working with housing authority to assist with d/c planning.  Pt to make temporary housing plans     LOS: 8 days    Stark Klein 08/23/2021

## 2021-08-23 NOTE — Progress Notes (Signed)
Initial Nutrition Assessment  DOCUMENTATION CODES:   Not applicable, suspect some degree of malnutrition but unable to confirm at this time  INTERVENTION:   - TPN per Pharmacy  Monitor magnesium, potassium, and phosphorus BID for at least 3 days, MD to replete as needed, as pt is at risk for refeeding syndrome given pt has essentially been NPO since admission (x 8 days) with <24 hours on full liquid diet.  - d/c Ensure Surgery as pt is NPO  NUTRITION DIAGNOSIS:   Inadequate oral intake related to altered GI function as evidenced by NPO status.  GOAL:   Patient will meet greater than or equal to 90% of their needs  MONITOR:   Diet advancement, Labs, Weight trends, Skin, I & O's, Other (TPN)  REASON FOR ASSESSMENT:   Consult New TPN/TNA  ASSESSMENT:   65 year old male who presented on 10/28 for surgery. PMH of stage III rectal cancer s/p chemotherapy and radiation.  10/28 - s/p cystoscopy with balloon dilation of bulbar urethral stricture and bilateral ureteral catheterization, lower anterior colon resection with diverting ileostomy; clear liquids 10/29 - NPO 10/30 - full liquids 10/31 - NPO 11/02 - NGT placed with significant output 11/04 - CT abdomen showing fluid collection, SBO, bladder thickening  RD consulted for new TPN. TPN to start today via port. NG tube remains in place to low intermittent suction with significant output.  Attempted to reach pt via phone call to room; however, no answer. RD reviewed [place] RD note from 01/08/21. At this visit, pt reported having a good appetite and stated that his UBW range was 150-160 lbs. No follow-ups with [place] RD noted since that date.  Reviewed weight history in chart. Pt with a steady decline in weight over the last 4-5 months. Overall, pt has lost 4 kg since 04/16/21. This is a 6.4% weight loss in 4 months which is not quite significant for timeframe. However, current weight of 58.9 kg (129.6 lbs) is well  below pt's previously stated UBW range. Strongly suspect pt with some degree of malnutrition but unable to confirm without NFPE and/or diet history.  Pt is at risk for refeeding syndrome given pt has essentially been NPO since admission (x 8 days) with <24 hours on full liquid diet.  Admit weight: 59.7 kg Current weight: 58.9 kg  Meal Completion: 25-50% of 2 full liquid meals on 10/30  Medications reviewed and include: Ensure Surgery, florastor IVF: D5 and 1/2 NS with KCl @ 75 ml/hr  Labs reviewed: hemoglobin 8.6  UOP: 1300 ml x 24 hours NGT: 3200 ml x 24 hours RLQ JP drain: 5 ml x 24 hours Ileostomy: 175 ml x 24 hours I/O's: -6.5 L since admit  NUTRITION - FOCUSED PHYSICAL EXAM:  Unable to complete at this time. RD working remotely.  Diet Order:   Diet Order             Diet NPO time specified Except for: Ice Chips, Sips with Meds  Diet effective now                   EDUCATION NEEDS:   Not appropriate for education at this time  Skin:  Skin Assessment: Skin Integrity Issues: Incisions: abdomen  Last BM:  08/22/21 175 ml x 24 hours via ileostomy  Height:   Ht Readings from Last 1 Encounters:  07/25/2021 5\' 6"  (1.676 m)    Weight:   Wt Readings from Last 1 Encounters:  08/22/21 58.9 kg  BMI:  Body mass index is 20.96 kg/m.  Estimated Nutritional Needs:   Kcal:  1800-2000  Protein:  85-100 grams  Fluid:  >/= 1.8 L    Gustavus Bryant, MS, RD, LDN Inpatient Clinical Dietitian Please see AMiON for contact information.

## 2021-08-23 NOTE — Progress Notes (Signed)
PHARMACY - TOTAL PARENTERAL NUTRITION CONSULT NOTE   Indication: Prolonged ileus  Patient Measurements: Height: 5\' 6"  (167.6 cm) Weight: 58.9 kg (129 lb 13.6 oz) IBW/kg (Calculated) : 63.8 TPN AdjBW (KG): 59.7 Body mass index is 20.96 kg/m.  Assessment:  65 yo M presents on 10/28 for eval of rectal cancer. S/p cystoscopy and diverting ileostomy.  Has been NPO for 7 days and NG tube in place with high output. Pharmacy consulted to start TPN.  Glucose / Insulin: CBGs controlled Electrolytes: K low at 3.5, magnesium low at 1.8,  CorrCa is WNL, phos is borderline low at 2.5, others WNL  Renal: UOP ok, SCr < 1  Hepatic: WNL  Intake / Output; MIVF:  - D5 1/2 NS w 20mEq KCl @ 34ml/hr.  - NG output ~4.1 L in past 24 hrs. Drain output minimal.  GI Imaging: 11/4 CT abdomen shows fluid collection, SBO, bladder thickening  GI Surgeries / Procedures:  10/28: Diverting ileostomy   Central access: Implanted Port TPN start date: 11/5  Nutritional Goals: Goal TPN rate is 85 mL/hr (provides 102 g of protein and 1958 kcals per day)  RD Assessment: Estimated Needs Total Energy Estimated Needs: 1800-2000 Total Protein Estimated Needs: 85-100 grams Total Fluid Estimated Needs: >/= 1.8 L  Current Nutrition:  NPO, ok for ice chips   Now:  - Magnesium 2 gr IV x 1  - Potassium 10 meq IV x 4   Plan:  Start TPN at 40 mL/hr at 1800 Electrolytes in TPN: Na 29mEq/L, K 93mEq/L, Ca 76mEq/L, Mg 52mEq/L, and Phos 20mmol/L. Cl:Ac 1:1 Add standard MVI and trace elements to TPN Initiate Sensitive q6h SSI and adjust as needed  Reduce MIVF to 40 mL/hr at 1800 BMP, magnesium, phosphorus with AM labs  Monitor TPN labs on Mon/Thurs    Royetta Asal, PharmD, BCPS 08/23/2021 11:07 AM

## 2021-08-24 LAB — BASIC METABOLIC PANEL
Anion gap: 8 (ref 5–15)
BUN: 7 mg/dL — ABNORMAL LOW (ref 8–23)
CO2: 27 mmol/L (ref 22–32)
Calcium: 8.4 mg/dL — ABNORMAL LOW (ref 8.9–10.3)
Chloride: 103 mmol/L (ref 98–111)
Creatinine, Ser: 0.58 mg/dL — ABNORMAL LOW (ref 0.61–1.24)
GFR, Estimated: >60 mL/min (ref >60)
Glucose, Bld: 144 mg/dL — ABNORMAL HIGH (ref 70–99)
Potassium: 4.3 mmol/L (ref 3.5–5.1)
Sodium: 138 mmol/L (ref 135–145)

## 2021-08-24 LAB — GLUCOSE, CAPILLARY
Glucose-Capillary: 128 mg/dL — ABNORMAL HIGH (ref 70–99)
Glucose-Capillary: 130 mg/dL — ABNORMAL HIGH (ref 70–99)
Glucose-Capillary: 133 mg/dL — ABNORMAL HIGH (ref 70–99)
Glucose-Capillary: 134 mg/dL — ABNORMAL HIGH (ref 70–99)
Glucose-Capillary: 145 mg/dL — ABNORMAL HIGH (ref 70–99)

## 2021-08-24 LAB — PHOSPHORUS: Phosphorus: 2.3 mg/dL — ABNORMAL LOW (ref 2.5–4.6)

## 2021-08-24 LAB — MAGNESIUM: Magnesium: 2.2 mg/dL (ref 1.7–2.4)

## 2021-08-24 MED ORDER — SODIUM PHOSPHATES 45 MMOLE/15ML IV SOLN
15.0000 mmol | Freq: Once | INTRAVENOUS | Status: AC
  Administered 2021-08-24: 15 mmol via INTRAVENOUS
  Filled 2021-08-24: qty 5

## 2021-08-24 MED ORDER — TRAVASOL 10 % IV SOLN
INTRAVENOUS | Status: AC
  Filled 2021-08-24: qty 780

## 2021-08-24 MED ORDER — DEXTROSE-NACL 5-0.45 % IV SOLN: INTRAVENOUS | Status: DC

## 2021-08-24 MED ORDER — ACETAMINOPHEN 10 MG/ML IV SOLN
1000.0000 mg | Freq: Four times a day (QID) | INTRAVENOUS | Status: AC
  Administered 2021-08-24 – 2021-08-25 (×4): 1000 mg via INTRAVENOUS
  Filled 2021-08-24 (×4): qty 100

## 2021-08-24 NOTE — Progress Notes (Signed)
PHARMACY - TOTAL PARENTERAL NUTRITION CONSULT NOTE   Indication: Prolonged ileus  Patient Measurements: Height: 5\' 6"  (167.6 cm) Weight: 58.9 kg (129 lb 13.6 oz) IBW/kg (Calculated) : 63.8 TPN AdjBW (KG): 59.7 Body mass index is 20.96 kg/m.  Assessment:  65 yo M presents on 10/28 for eval of rectal cancer. S/p cystoscopy and diverting ileostomy.  Has been NPO for 7 days and NG tube in place with high output. Pharmacy consulted to start TPN.  Glucose / Insulin: CBGs controlled, target < 180  Electrolytes: Phos is   low at 2.3, others WNL  Renal: UOP ok, SCr < 1  Hepatic: WNL  Intake / Output; MIVF:  - D5 1/2 NS w 25mEq KCl @ 40 ml/hr.  - NG output 613mL in past 24 hrs. Drain output minimal.  GI Imaging: 11/4 CT abdomen shows fluid collection, SBO, bladder thickening  GI Surgeries / Procedures:  10/28: Diverting ileostomy   Central access: Implanted Port TPN start date: 11/5  Nutritional Goals: Goal TPN rate is 85 mL/hr (provides 102 g of protein and 1958 kcals per day)  RD Assessment: Estimated Needs Total Energy Estimated Needs: 1800-2000 Total Protein Estimated Needs: 85-100 grams Total Fluid Estimated Needs: >/= 1.8 L  Current Nutrition:  NPO, ok for ice chips   Now:  - sodium phosphate IV 15 mmol IV x1   Plan:  Increase  TPN to 65 mL/hr at 1800 Electrolytes in TPN: Na 20mEq/L, K 57mEq/L, Ca 60mEq/L, Mg 8mEq/L, and  Increase Phos 20 mmol/L. Cl:Ac 1:1 Add standard MVI and trace elements to TPN Continue  Sensitive q6h SSI and adjust as needed  Reduce MIVF to KVO  mL/hr at 1800. Will remove potassium  from this fluid as potassium is being managed by the TPN Monitor TPN labs on Mon/Thurs    Royetta Asal, PharmD, BCPS 08/24/2021 11:43 AM

## 2021-08-24 NOTE — Progress Notes (Signed)
Assessment & Plan: POD#9 - s/p Procedure(s): XI ROBOTIC ASSISTED LOWER ANTERIOR RESECTION, WITH BILATERAL TAP BLOCK AND INTRAOPERATIVE ASSESSMENT USING ICG (N/A) DIVERTING ILEOSTOMY (N/A) CYSTOSCOPY with FIREFLY INJECTION AND URETHERAL DILATION (N/A)   Expected post op Ileus: improving Keep NPO other than ice chips with given leak, continue NG decompression CT concerning for leak - reviewed by Dr. Marcello Moores   Ambulate in hall, appreciate PT eval  TNA initiated last PM Cont IV pain meds - Dilaudid 1 mg IV q3h PRN Will transition to PO meds when ileus resolves Urinary retention: foley replaced on Mon.  Cont this over the weekend  Dispo: pt does not have a reliable housing option at discharge.  TOC team consulted and working with housing authority to assist with d/c planning.  Pt to make temporary housing plans.        [name], MD       [facility]       Office: [phone]   Chief Complaint: Rectal ca  Subjective: Patient in bed, complains of pain mainly from NG tube.  Does not want  Objective: Vital signs in last 24 hours: Temp:  [98.3 F (36.8 C)-99.5 F (37.5 C)] 99.5 F (37.5 C) (11/06 0650) Pulse Rate:  [93-98] 93 (11/06 0650) Resp:  [16-20] 20 (11/06 0650) BP: (99-114)/(59-70) 100/68 (11/06 0650) SpO2:  [96 %-98 %] 98 % (11/06 0650) Last BM Date: 08/23/21 (ileostomy)  Intake/Output from previous day: 11/05 0701 - 11/06 0700 In: 1200.1 [I.V.:850.1; IV Piggyback:350] Out: 36 [Urine:950; Emesis/NG output:650; Stool:300] Intake/Output this shift: Total I/O In: -  Out: 75 [Stool:75]  Physical Exam: HEENT - sclerae clear, mucous membranes moist Neck - soft Chest - clear bilaterally Cor - RRR Abdomen - dressing intact; stoma pink and viable, minimal output; JP drain with small feculent appearing material in bulb Ext - no edema, non-tender Neuro - alert & oriented, no focal deficits  Lab Results:  Recent Labs    08/22/21 0318  08/23/21 0311  WBC 13.7* 14.2*  HGB 8.3* 8.6*  HCT 26.7* 27.3*  PLT 485* 578*   BMET Recent Labs    08/23/21 0311 08/24/21 0317  NA 139 138  K 3.5 4.3  CL 102 103  CO2 26 27  GLUCOSE 107* 144*  BUN 6* 7*  CREATININE 0.62 0.58*  CALCIUM 8.4* 8.4*   PT/INR No results for input(s): LABPROT, INR in the last 72 hours. Comprehensive Metabolic Panel:    Component Value Date/Time   NA 138 08/24/2021 0317   NA 139 08/23/2021 0311   K 4.3 08/24/2021 0317   K 3.5 08/23/2021 0311   CL 103 08/24/2021 0317   CL 102 08/23/2021 0311   CO2 27 08/24/2021 0317   CO2 26 08/23/2021 0311   BUN 7 (L) 08/24/2021 0317   BUN 6 (L) 08/23/2021 0311   CREATININE 0.58 (L) 08/24/2021 0317   CREATININE 0.62 08/23/2021 0311   CREATININE 0.79 06/09/2021 1020   CREATININE 0.86 05/07/2021 0826   GLUCOSE 144 (H) 08/24/2021 0317   GLUCOSE 107 (H) 08/23/2021 0311   CALCIUM 8.4 (L) 08/24/2021 0317   CALCIUM 8.4 (L) 08/23/2021 0311   AST 10 (L) 08/23/2021 0311   AST 11 (L) 06/09/2021 1020   AST 16 05/07/2021 0826   ALT 6 08/23/2021 0311   ALT 5 06/09/2021 1020   ALT 7 05/07/2021 0826   ALKPHOS 46 08/23/2021 0311   ALKPHOS 55 06/09/2021 1020   BILITOT 1.1 08/23/2021 0311  BILITOT 0.3 06/09/2021 1020   BILITOT 0.3 05/07/2021 0826   PROT 6.5 08/23/2021 0311   PROT 7.7 06/09/2021 1020   ALBUMIN 2.4 (L) 08/23/2021 0311   ALBUMIN 3.8 06/09/2021 1020    Studies/Results: CT ABDOMEN PELVIS W CONTRAST  Result Date: 08/22/2021 CLINICAL DATA:  Colon cancer. Status post resection. Follow-up intra-abdominal abscess. EXAM: CT ABDOMEN AND PELVIS WITH CONTRAST TECHNIQUE: Multidetector CT imaging of the abdomen and pelvis was performed using the standard protocol following bolus administration of intravenous contrast. CONTRAST:  51mL OMNIPAQUE IOHEXOL 350 MG/ML SOLN COMPARISON:  CT chest abdomen and pelvis 12/10/2020. FINDINGS: Lower chest: There are bands of atelectasis in the bilateral lower lobes.  Hepatobiliary: Gallbladder sludge versus excreted contrast in the gallbladder. There is no bile duct dilatation. The liver is within normal limits. Pancreas: Unremarkable. No pancreatic ductal dilatation or surrounding inflammatory changes. Spleen: Normal in size without focal abnormality. Adrenals/Urinary Tract: A the adrenal glands and kidneys are within normal limits bilaterally. There is broader wall thickening diffusely. Air is seen in the bladder likely related to for catheter placement. Stomach/Bowel: Patient is status post sigmoid colon resection with new right-sided ileostomy. On the sagittal view there is an area concerning for wall defect along the posterior margin of the surgical anastomosis image 9/55. There is an air-fluid collection in the presacral region abutting the surgical anastomosis measuring 5.1 x 5.5 x 3.3 cm. Percutaneous drainage catheter is present in this region along the right lateral margin. There remaining colon is nondilated. The appendix is not seen. There are small bowel loops in the right lower quadrant demonstrating wall thickening and mesenteric edema. Small bowel loops in the left abdomen are fluid-filled and dilated measuring up to 4 cm. Stomach is moderately distended with contrast. Nasogastric tube tip is in the body of the stomach. Vascular/Lymphatic: No significant vascular findings are present. No enlarged abdominal or pelvic lymph nodes. Reproductive: Prostate gland is prominent in size, unchanged. Other: There is free air in the intramuscular and subcutaneous anterior abdominal wall compatible with recent surgery. There is diffuse body wall edema. There are no fluid collections in the abdominal wall. Musculoskeletal: Degenerative changes affect the spine. IMPRESSION: 1. Status post sigmoid colon resection. There findings worrisome for discontinuity of the posterior bowel wall the level of the anastomosis. There is an air-fluid collection at this level measuring 5.1 x 5.3  x 3.3 cm which may represent abscess or sequelae from perforation. Surgical drain is seen at this level. 2. Findings compatible with small-bowel obstruction. Definitive transition point not visualize, but likely in the mid small bowel. 3. Right-sided ileostomy appears unremarkable. 4. Bladder wall thickening concerning for cystitis. Foley catheter in place. 5. Wall thickening of small bowel loops in the pelvis which may be secondary to reactive enteritis. This does not appear to be the transition for small bowel obstruction. 6. Nasogastric tube tip in the mid stomach. 7. Body wall edema. 8. These results were called by telephone at the time of interpretation on 08/22/2021 at 5:33 pm to provider Dr. [name], Who verbally acknowledged these results. Electronically Signed   By: Ronney Asters M.D.   On: 08/22/2021 17:34      [name] 08/24/2021   Patient ID: Phillip Brooks, male   DOB: 13-Sep-1956, 65 y.o.   MRN: [id number]

## 2021-08-25 ENCOUNTER — Other Ambulatory Visit (HOSPITAL_COMMUNITY): Payer: Self-pay

## 2021-08-25 LAB — COMPREHENSIVE METABOLIC PANEL
ALT: 12 U/L (ref 0–44)
AST: 23 U/L (ref 15–41)
Albumin: 2.6 g/dL — ABNORMAL LOW (ref 3.5–5.0)
Alkaline Phosphatase: 47 U/L (ref 38–126)
Anion gap: 7 (ref 5–15)
BUN: 11 mg/dL (ref 8–23)
CO2: 27 mmol/L (ref 22–32)
Calcium: 8.7 mg/dL — ABNORMAL LOW (ref 8.9–10.3)
Chloride: 103 mmol/L (ref 98–111)
Creatinine, Ser: 0.71 mg/dL (ref 0.61–1.24)
GFR, Estimated: >60 mL/min (ref >60)
Glucose, Bld: 129 mg/dL — ABNORMAL HIGH (ref 70–99)
Potassium: 4.5 mmol/L (ref 3.5–5.1)
Sodium: 137 mmol/L (ref 135–145)
Total Bilirubin: 0.7 mg/dL (ref 0.3–1.2)
Total Protein: 7.1 g/dL (ref 6.5–8.1)

## 2021-08-25 LAB — GLUCOSE, CAPILLARY
Glucose-Capillary: 137 mg/dL — ABNORMAL HIGH (ref 70–99)
Glucose-Capillary: 119 mg/dL — ABNORMAL HIGH (ref 70–99)
Glucose-Capillary: 142 mg/dL — ABNORMAL HIGH (ref 70–99)
Glucose-Capillary: 128 mg/dL — ABNORMAL HIGH (ref 70–99)

## 2021-08-25 LAB — TRIGLYCERIDES: Triglycerides: 90 mg/dL (ref <150)

## 2021-08-25 LAB — PHOSPHORUS: Phosphorus: 4.3 mg/dL (ref 2.5–4.6)

## 2021-08-25 LAB — MAGNESIUM: Magnesium: 2.3 mg/dL (ref 1.7–2.4)

## 2021-08-25 MED ORDER — METRONIDAZOLE 500 MG PO TABS: 500.0000 mg | ORAL_TABLET | Freq: Two times a day (BID) | ORAL | Status: DC

## 2021-08-25 MED ORDER — SODIUM CHLORIDE 0.9 % IV SOLN
2.0000 g | INTRAVENOUS | Status: DC
  Administered 2021-08-25: 2 g via INTRAVENOUS
  Filled 2021-08-25 (×2): qty 20

## 2021-08-25 MED ORDER — GABAPENTIN 250 MG/5ML PO SOLN
300.0000 mg | Freq: Two times a day (BID) | ORAL | Status: DC
  Administered 2021-08-25 (×2): 300 mg
  Filled 2021-08-25 (×3): qty 6

## 2021-08-25 MED ORDER — MENTHOL 3 MG MT LOZG: 1.0000 | LOZENGE | OROMUCOSAL | Status: DC | PRN

## 2021-08-25 MED ORDER — SACCHAROMYCES BOULARDII 250 MG PO CAPS
250.0000 mg | ORAL_CAPSULE | Freq: Two times a day (BID) | ORAL | Status: DC
  Administered 2021-08-25 – 2021-08-26 (×3): 250 mg
  Filled 2021-08-25 (×3): qty 1

## 2021-08-25 MED ORDER — TRAVASOL 10 % IV SOLN
INTRAVENOUS | Status: AC
  Filled 2021-08-25: qty 1020

## 2021-08-25 MED ORDER — METRONIDAZOLE 500 MG PO TABS
500.0000 mg | ORAL_TABLET | Freq: Two times a day (BID) | ORAL | Status: DC
  Administered 2021-08-25 (×2): 500 mg
  Filled 2021-08-25 (×3): qty 1

## 2021-08-25 NOTE — TOC Progression Note (Signed)
Transition of Care [facility]) - Progression Note    Patient Details  Name: Phillip Brooks MRN: [id number] Date of Birth: 03/08/1956  [facility] CM/SW Contact  Lennart Pall, LCSW Phone Number: 08/25/2021, 1:58 PM  Clinical Narrative:    Met with pt to check in any update on his housing needs.  Pt reports that his mother is to arrive in town today and that she and his girlfriend are attempting to assist with finding a stable housing arrangement for him but nothing specific on details.  Pt understands the urgency to have housing plan in place.  Will follow up with him again tomorrow.   Expected Discharge Plan: [place] Barriers to Discharge: Continued Medical Work up  Expected Discharge Plan and Services Expected Discharge Plan: [place] In-house Referral: Clinical Social Work     Living arrangements for the past 2 months: Single Family Home                 DME Arranged: N/A DME Agency: NA                   Social Determinants of Health (SDOH) Interventions    Readmission Risk Interventions No flowsheet data found.

## 2021-08-25 NOTE — Consult Note (Addendum)
[place] Nurse ostomy follow up Stoma type/location: RLQ ileostomy Stomal assessment/size:  1 and 1/8 inch ostomy, slightly prolapsed/long Peristomal assessment: intact Treatment options for stomal/peristomal skin: skin barrier ring Output: brown effluent. 150 mls emptied from pouch Ostomy pouching: 2pc. 1 and 1/4 inch pouching system with skin barrier ring. Education provided:  Patient assisted to prepare ostomy pouching system and change pouch today, despite his reluctance. Patient believes that when he is independent, he will be discharged and he does not want this.  He repeatedly tells me he is not ready to go anywhere.  I remind the patient that he as an NG Tube and that he would not be discharged with that tube, but that we are definitely trying to move him toward discharge.  Ostomy pouching system preparation: Patient assisted to trace pattern on back of pouch and to use scissors to cut out the pattern. This takes an extremely long time as patient is not proficient in using scissors. He takes off the plastic backing from the pouch. Aperture has to be enlarges as he did not cut the pattern large enough. He then (with cueing) takes the skin barrier ring out of the package, and removes the paper coverings from both sides. He stretches the skin barrier ring to match the size of the skin barrier aperture and places it onto the back of the skin barrier. Pouch change procedure:  We then turn our attention to the removal of the current pouching system. He is assisted to remove the system with a warm, damp washcloth and using a pushing on the skin, pulling on the skin barrier technique to remove it. Again, this takes many minutes and lots of encouragement. Patient then uses the warm washcloth to cleanse his skin.  We discuss that occasionally the stoma may function when the pouching system is off and what to do if that happens (clean the skin and begin again).  Patient is then assisted to place the skin  barrier on the skin around the stoma by using his dominant hand to hold the skin barrier and use his nondominant hand to pull up on the skin above the stoma to more clearly visualize the placement.  Once the skin barrier and skin barrier ring are placed, patient is assisted to attach the pouch to the skin barrier. He requires much assistance with this, but I reinforce that this is his first time doing it and that it will get easier. Patient is able to perform the Lock and Roll closure as he has been practicing emptying and is nearing independence with this.  Patient will benefit from referral to outpatient ostomy clinic. If you agree, please refer. If [facility] is available, would also benefit from [facility].  Next pouch change is scheduled for Wednesday (sooner than needed for practice).  Supplies at bedside: 5 skin barriers, 5 skin barrier rings, 5 pouches, educational booklet, pattern, stoma measuring guide.  Enrolled patient in [employer] Start Discharge program: Yes, previously  [place] nursing team will continue to follow, and will remain available to this patient, the nursing and medical teams.   Thanks, Maudie Flakes, MSN, RN, [place], Arther Abbott  Pager# [phone]

## 2021-08-25 NOTE — Progress Notes (Signed)
PHARMACY - TOTAL PARENTERAL NUTRITION CONSULT NOTE   Indication: Prolonged ileus  Patient Measurements: Height: 5\' 6"  (167.6 cm) Weight: 58.9 kg (129 lb 13.6 oz) IBW/kg (Calculated) : 63.8 TPN AdjBW (KG): 59.7 Body mass index is 20.96 kg/m.  Assessment:  65 yo M presents on 10/28 for eval of rectal cancer. S/P LAR and diverting ileostomy.  Has been NPO for 7 days and NG tube in place with high output. Pharmacy consulted to start TPN.   Glucose / Insulin: CBGs controlled, target < 180  Electrolytes: All WNL  Renal: UOP ok, SCr < 1  Hepatic: WNL  Intake / Output; MIVF:  - D5 1/2 NS @ 10 ml/hr.  - NG output 425mL in past 24 hrs. Drain output minimal. 800 ml ostomy GI Imaging: 11/4 CT abdomen shows fluid collection, SBO, bladder thickening  GI Surgeries / Procedures:  10/28: Diverting ileostomy   Central access: Implanted Port TPN start date: 11/5   Nutritional Goals:  Goal TPN rate is 85 mL/hr (provides 102 g of protein and 1958 kcals per day)  RD Assessment: Estimated Needs Total Energy Estimated Needs: 1800-2000 Total Protein Estimated Needs: 85-100 grams Total Fluid Estimated Needs: >/= 1.8 L  Current Nutrition:  NPO, ok for ice chips  Plan:  Increase  TPN to goal rate of 85 mL/hr at 1800 Electrolytes in TPN: Na 41mEq/L, K 19mEq/L, Ca 53mEq/L, Mg 4mEq/L, and Phos 20 mmol/L.  Cl:Ac 1:1 Add standard MVI and trace elements to TPN Continue  Sensitive q6h SSI and adjust as needed  Continue M[facility] at [facility]  mL/hr at 1800 Monitor TPN labs on Mon/Thurs BMET, Mg, and Phos in AM  Ulice Dash D  08/25/2021 7:18 AM

## 2021-08-25 NOTE — Progress Notes (Addendum)
10 Days Post-Op robotic LAR, diverting ostomy  Subjective/Chief Complaint: NG in place  Pain is currently controlled, c/o mainly of pain when he swallows Ambulating in hall  Objective: Vital signs in last 24 hours: Temp:  [97.8 F (36.6 C)-100 F (37.8 C)] 98 F (36.7 C) (11/07 0554) Pulse Rate:  [93-100] 100 (11/07 0554) Resp:  [18-20] 18 (11/07 0554) BP: (103-111)/(64-76) 108/73 (11/07 0554) SpO2:  [99 %-100 %] 100 % (11/07 0554) Last BM Date: 08/24/21  Intake/Output from previous day: 11/06 0701 - 11/07 0700 In: 4054.9 [I.V.:3420.4; IV Piggyback:634.5] Out: 3735 [Urine:2400; Emesis/NG output:450; Drains:85; Stool:800] Intake/Output this shift: No intake/output data recorded.  General appearance: alert, cooperative, and no distress GI: less distended, less tender in lower abdomen; ostomy pink with min some liquid stool Incisions OK, drain with purulent drainage NG with bilious output Lab Results:  Recent Labs    08/23/21 0311  WBC 14.2*  HGB 8.6*  HCT 27.3*  PLT 578*    BMET Recent Labs    08/24/21 0317 08/25/21 0408  NA 138 137  K 4.3 4.5  CL 103 103  CO2 27 27  GLUCOSE 144* 129*  BUN 7* 11  CREATININE 0.58* 0.71  CALCIUM 8.4* 8.7*    PT/INR No results for input(s): LABPROT, INR in the last 72 hours. ABG No results for input(s): PHART, HCO3 in the last 72 hours.  Invalid input(s): PCO2, PO2  Studies/Results: No results found.  Anti-infectives: Anti-infectives (From admission, onward)    Start     Dose/Rate Route Frequency Ordered Stop   08/25/21 1000  cefTRIAXone (ROCEPHIN) 2 g in sodium chloride 0.9 % 100 mL IVPB       Note to Pharmacy: Pharmacy may adjust dosing strength / duration / interval for maximal efficacy   2 g 200 mL/hr over 30 Minutes Intravenous Every 24 hours 08/25/21 0823     08/01/2021 2000  cefoTEtan (CEFOTAN) 2 g in sodium chloride 0.9 % 100 mL IVPB        2 g 200 mL/hr over 30 Minutes Intravenous Every 12 hours 08/10/2021  1647 07/23/2021 2046   08/14/2021 0600  cefoTEtan (CEFOTAN) 2 g in sodium chloride 0.9 % 100 mL IVPB        2 g 200 mL/hr over 30 Minutes Intravenous On call to O.R. 08/16/2021 1610 08/09/2021 0806       Assessment/Plan: s/p Procedure(s): XI ROBOTIC ASSISTED LOWER ANTERIOR RESECTION, WITH BILATERAL TAP BLOCK AND INTRAOPERATIVE ASSESSMENT USING ICG (N/A) DIVERTING ILEOSTOMY (N/A) CYSTOSCOPY with FIREFLY INJECTION AND URETHERAL DILATION (N/A) Expected post op Ileus: NPO until better bowel function, ok for sips of clears, cont to monitor ostomy output, NG output less than ostomy output.  Will try to clamp NG and see how he does today Moderate malnutrition, prolonged ileus: cont TPN Elevated WBC, purulence in JP: CT concerning for possible anastomotic dehisscence vs abscess for previous perforated rectal cancer, JP drainage catheter appears to be in appropriate position, ceftriaxone, flagyl x 5d Ambulate in hall, appreciate PT eval Dehydration due to N/V: Cr back to normal, UOP good-  Cont IVF's until pt tolerating PO Cont IV pain meds but will start to wean - Dilaudid 1 mg IV q3h PRN.  Will transition to PO meds when ileus resolves Urinary retention: foley replaced on Mon.  D/c foley today with voiding trial Dispo: pt does not have a reliable housing option at discharge.  TOC team consulted and working with housing authority to assist with d/c planning.  Pt  to make temporary housing plans     LOS: 10 days    Rosario Adie 82/03/4157

## 2021-08-26 LAB — BASIC METABOLIC PANEL
Anion gap: 9 (ref 5–15)
BUN: 18 mg/dL (ref 8–23)
CO2: 24 mmol/L (ref 22–32)
Calcium: 8.5 mg/dL — ABNORMAL LOW (ref 8.9–10.3)
Chloride: 103 mmol/L (ref 98–111)
Creatinine, Ser: 0.78 mg/dL (ref 0.61–1.24)
GFR, Estimated: >60 mL/min (ref >60)
Glucose, Bld: 134 mg/dL — ABNORMAL HIGH (ref 70–99)
Potassium: 4.6 mmol/L (ref 3.5–5.1)
Sodium: 136 mmol/L (ref 135–145)

## 2021-08-26 LAB — CBC
HCT: 24.1 % — ABNORMAL LOW (ref 39.0–52.0)
Hemoglobin: 7.5 g/dL — ABNORMAL LOW (ref 13.0–17.0)
MCH: 28 pg (ref 26.0–34.0)
MCHC: 31.1 g/dL (ref 30.0–36.0)
MCV: 89.9 fL (ref 80.0–100.0)
Platelets: 666 10*3/uL — ABNORMAL HIGH (ref 150–400)
RBC: 2.68 MIL/uL — ABNORMAL LOW (ref 4.22–5.81)
RDW: 15.2 % (ref 11.5–15.5)
WBC: 18.1 10*3/uL — ABNORMAL HIGH (ref 4.0–10.5)
nRBC: 0 % (ref 0.0–0.2)

## 2021-08-26 LAB — GLUCOSE, CAPILLARY
Glucose-Capillary: 130 mg/dL — ABNORMAL HIGH (ref 70–99)
Glucose-Capillary: 138 mg/dL — ABNORMAL HIGH (ref 70–99)
Glucose-Capillary: 125 mg/dL — ABNORMAL HIGH (ref 70–99)

## 2021-08-26 LAB — PHOSPHORUS: Phosphorus: 4.4 mg/dL (ref 2.5–4.6)

## 2021-08-26 LAB — MAGNESIUM: Magnesium: 2.3 mg/dL (ref 1.7–2.4)

## 2021-08-26 MED ORDER — PIPERACILLIN-TAZOBACTAM 3.375 G IVPB
3.3750 g | Freq: Three times a day (TID) | INTRAVENOUS | Status: DC
  Administered 2021-08-26 – 2021-09-01 (×17): 3.375 g via INTRAVENOUS
  Filled 2021-08-26 (×17): qty 50

## 2021-08-26 MED ORDER — ACETAMINOPHEN 10 MG/ML IV SOLN
1000.0000 mg | Freq: Four times a day (QID) | INTRAVENOUS | Status: AC
  Administered 2021-08-26 – 2021-08-27 (×4): 1000 mg via INTRAVENOUS
  Filled 2021-08-26 (×4): qty 100

## 2021-08-26 MED ORDER — TRAVASOL 10 % IV SOLN
INTRAVENOUS | Status: AC
  Filled 2021-08-26: qty 1020

## 2021-08-26 MED ORDER — METRONIDAZOLE 500 MG/100ML IV SOLN: 500.0000 mg | Freq: Two times a day (BID) | INTRAVENOUS | Status: DC

## 2021-08-26 NOTE — Progress Notes (Signed)
Occupational Therapy Treatment Patient Details Name: Phillip Brooks MRN: [id number] DOB: 1955/11/28 Today's Date: 08/26/2021   History of present illness Patient is 65 y.o. male s/p robotic LAR, diverting ostomy on 08/18/2021 for advanced mid rectal cancer.   OT comments  Patient progressing and showed improved ability to perform LB ADLs at recline level with use of long handled adaptive equipment for donning and doffing socks and pants with supervision/setup, compared to previous session where pt required Max-Total Assist. Pt is easily distracted with possible cognitive deficits and does require redirection to ask/topic and will likely require reinforcement of all education. Patient remains limited by abdominal pain, generalized weakness and decreased activity tolerance along with deficits noted below. Pt continues to demonstrate good rehab potential and would benefit from continued skilled OT to increase safety and independence with ADLs and functional transfers to allow pt to return home safely and reduce caregiver burden and fall risk.    Recommendations for follow up therapy are one component of a multi-disciplinary discharge planning process, led by the attending physician.  Recommendations may be updated based on patient status, additional functional criteria and insurance authorization.    Follow Up Recommendations  Home health OT    Assistance Recommended at Discharge Intermittent Supervision/Assistance  Equipment Recommendations  Other (comment) (Long handled AE)    Recommendations for Other Services      Precautions / Restrictions Precautions Precautions: Fall Precaution Comments: abdominal surgery, NG tube, R iliostomy, R JP drain Restrictions Weight Bearing Restrictions: No       Mobility Bed Mobility Overal bed mobility: Needs Assistance Bed Mobility: Supine to Sit     Supine to sit: HOB elevated;Min assist     General bed mobility comments: Pt not following  structions for log roll. Pt put out hand and stated "help me". Pt required Min Assist in form of HHA to raise trunk fully from bed.    Transfers Overall transfer level: Needs assistance Equipment used: Rolling walker (2 wheels) Transfers: Sit to/from Stand Sit to Stand: Min guard           General transfer comment: Pt with loss of balance when coming upright, presumably due to abdominal pain with facial grimace. Min guard needed to steady pt at [facility] for balance/safety. Pt used 2 wheeled RW and ambulated around his bed, slowly with [employer] assist. Verbal and tactile cues needed for hand placement with sit to stand and stand to sit.     Balance Overall balance assessment: Needs assistance Sitting-balance support: Feet supported Sitting balance-Leahy Scale: Good     Standing balance support: During functional activity;Reliant on assistive device for balance;Bilateral upper extremity supported Standing balance-Leahy Scale: Poor Standing balance comment: reliance on RW for support, with intiial LOB                           ADL either performed or assessed with clinical judgement   ADL Overall ADL's : Needs assistance/impaired                     Lower Body Dressing: Min guard;Sitting/lateral leans;Sit to/from stand;With adaptive equipment Lower Body Dressing Details (indicate cue type and reason): While in recliner, pt was unable to demonstrate figure 4 compensatory positioning for LE dressing, therefore pt trained in adaptive equipment. Pt used reacher to doff socks and sock aid to don socks with supervision/cues after initial demonstration on technique. Pt used reacher to don and doff simulated "pants"  over feet, using a pillow case.  PT used reacher to grasp several things out of reach and educated on uses to avoid forward bend at waist for pain mangement.   Toilet Transfer Details (indicate cue type and reason): Pt transferred to recliner. Please see Mobiliy  section.   Toileting - Clothing Manipulation Details (indicate cue type and reason): Pt on condom cath and ileostomy ag.     Functional mobility during ADLs: Minimal assistance;Min guard;Rolling walker (2 wheels)      Extremity/Trunk Assessment              Vision Patient Visual Report: No change from baseline     Perception     Praxis      Cognition Arousal/Alertness: Awake/alert Behavior During Therapy: WFL for tasks assessed/performed Overall Cognitive Status: No family/caregiver present to determine baseline cognitive functioning                                 General Comments: Easily distracted with need of cues to attend to task/topic. Tangential at times.  Needs long rest breaks in between activities.          Exercises     Shoulder Instructions       General Comments      Pertinent Vitals/ Pain       Pain Assessment: 0-10 Pain Score: 10-Worst pain ever Breathing: normal Negative Vocalization: none Facial Expression: facial grimacing Body Language: tense, distressed pacing, fidgeting Consolability: no need to console PAINAD Score: 3 Pain Location: abdomen Pain Descriptors / Indicators: Grimacing;Discomfort;Guarding Pain Intervention(s): Premedicated before session;Limited activity within patient's tolerance;Monitored during session (RN in room as pt giving pain report.)  Home Living                                          Prior Functioning/Environment              Frequency  Min 2X/week        Progress Toward Goals  OT Goals(current goals can now be found in the care plan section)  Progress towards OT goals: Progressing toward goals  Acute Rehab OT Goals Patient Stated Goal: "Set up my new home and have therapy come there." OT Goal Formulation: With patient Time For Goal Achievement: 09/03/21 Potential to Achieve Goals: Good  Plan Discharge plan remains appropriate    Co-evaluation                  AM-PAC OT "6 Clicks" Daily Activity     Outcome Measure   Help from another person eating meals?: None Help from another person taking care of personal grooming?: A Little Help from another person toileting, which includes using toliet, bedpan, or urinal?: A Little Help from another person bathing (including washing, rinsing, drying)?: A Little Help from another person to put on and taking off regular upper body clothing?: A Little Help from another person to put on and taking off regular lower body clothing?: A Little 6 Click Score: 19    End of Session Equipment Utilized During Treatment: Rolling walker (2 wheels)  OT Visit Diagnosis: Pain;Muscle weakness (generalized) (M62.81) Pain - part of body:  (ABD)   Activity Tolerance Patient limited by pain   Patient Left in chair;with call bell/phone within reach   Nurse Communication Other (comment) (Abd incision drainage.  Coordinated timing of OT with RN)        Time: [phone] OT Time Calculation (min): 36 min  Charges: OT General Charges $OT Visit: 1 Visit OT Treatments $Self Care/Home Management : 8-22 mins $Therapeutic Activity: 8-22 mins  Anderson Malta, OT Acute Rehab Services Office: [phone] 08/26/2021  Phillip Brooks 08/26/2021, 10:03 AM

## 2021-08-26 NOTE — Progress Notes (Signed)
PT Cancellation Note  Patient Details Name: Phillip Brooks MRN: [id number] DOB: 07-05-1956   Cancelled Treatment:    Reason Eval/Treat Not Completed: Fatigue/lethargy limiting ability to participate Unable to arouse patient for sustained time. He would open eyes  then back to sleep . Will check back another time.  Claretha Cooper 08/26/2021, 4:36 PM [place] Pager [phone] Office [phone]

## 2021-08-26 NOTE — Progress Notes (Signed)
PHARMACY - TOTAL PARENTERAL NUTRITION CONSULT NOTE   Indication: Prolonged ileus  Patient Measurements: Height: 5\' 6"  (167.6 cm) Weight: 55.1 kg (121 lb 7.6 oz) IBW/kg (Calculated) : 63.8 TPN AdjBW (KG): 59.7 Body mass index is 19.61 kg/m.  Assessment:  65 yo M presents on 10/28 for eval of rectal cancer. S/P LAR and diverting ileostomy.  Has been NPO for 7 days and NG tube in place with high output. Pharmacy consulted to start TPN.   Glucose / Insulin: CBGs controlled, target < 180, 3 units SSI/24 hours Electrolytes: All WNL  Renal: UOP ok, SCr < 1  Hepatic: WNL  Intake / Output; MIVF:  - D5 1/2 NS @ 10 ml/hr.  - NG out 11/7. Drain output minimal to none. 300 ml ostomy. Only 200 mL UOP charted, Surgery aware GI Imaging: 11/4 CT abdomen shows fluid collection, SBO, bladder thickening  GI Surgeries / Procedures:  10/28: Diverting ileostomy   Central access: Implanted Port TPN start date: 11/5   Nutritional Goals:  Goal TPN rate is 85 mL/hr (provides 102 g of protein and 1958 kcals per day)  RD Assessment: Estimated Needs Total Energy Estimated Needs: 1800-2000 Total Protein Estimated Needs: 85-100 grams Total Fluid Estimated Needs: >/= 1.8 L  Current Nutrition:  NPO, ok for ice chips  Plan:  Continue TPN to goal rate of 85 mL/hr at 1800 Electrolytes in TPN: Na 67mEq/L, K 48mEq/L, Ca 97mEq/L, Mg 67mEq/L, and Phos 20 mmol/L.  Cl:Ac 1:1 Add standard MVI and trace elements to TPN Continue  Sensitive q6h SSI and adjust as needed  Continue M[facility]F at [facility]   Monitor TPN labs on Mon/Thurs BMET, Mg, and Phos in AM  Ulice Dash D  08/26/2021 7:00 AM

## 2021-08-26 NOTE — Progress Notes (Addendum)
11 Days Post-Op robotic LAR, diverting ostomy  Subjective/Chief Complaint: Pain is currently controlled, mainly lower abd pain Ambulating some in hall, states he tried to walk last night but nursing staff wouldn't let him  Objective: Vital signs in last 24 hours: Temp:  [98.2 F (36.8 C)-100.4 F (38 C)] 98.2 F (36.8 C) (11/08 0453) Pulse Rate:  [102-109] 109 (11/08 0453) Resp:  [16-20] 20 (11/08 0453) BP: (104-122)/(71-74) 122/74 (11/08 0453) SpO2:  [100 %] 100 % (11/08 0453) Weight:  [55.1 kg] 55.1 kg (11/08 0512) Last BM Date: 08/26/21  Intake/Output from previous day: 11/07 0701 - 11/08 0700 In: 3171.2 [P.O.:60; I.V.:2711.2; IV Piggyback:400] Out: 510 [Urine:200; Drains:10; Stool:300] Intake/Output this shift: No intake/output data recorded.  General appearance: alert, cooperative, and no distress GI: distended, tender in lower abdomen; ostomy pink with min some liquid stool Incisions OK, drain with purulent drainage  Lab Results:  Recent Labs    08/26/21 0416  WBC 18.1*  HGB 7.5*  HCT 24.1*  PLT 666*    BMET Recent Labs    08/25/21 0408 08/26/21 0416  NA 137 136  K 4.5 4.6  CL 103 103  CO2 27 24  GLUCOSE 129* 134*  BUN 11 18  CREATININE 0.71 0.78  CALCIUM 8.7* 8.5*    PT/INR No results for input(s): LABPROT, INR in the last 72 hours. ABG No results for input(s): PHART, HCO3 in the last 72 hours.  Invalid input(s): PCO2, PO2  Studies/Results: No results found.  Anti-infectives: Anti-infectives (From admission, onward)    Start     Dose/Rate Route Frequency Ordered Stop   08/26/21 1000  metroNIDAZOLE (FLAGYL) IVPB 500 mg        500 mg 100 mL/hr over 60 Minutes Intravenous Every 12 hours 08/26/21 0830 08/30/21 0959   08/25/21 1030  metroNIDAZOLE (FLAGYL) tablet 500 mg  Status:  Discontinued        500 mg Per Tube Every 12 hours 08/25/21 0935 08/26/21 0830   08/25/21 1000  cefTRIAXone (ROCEPHIN) 2 g in sodium chloride 0.9 % 100 mL IVPB        Note to Pharmacy: Pharmacy may adjust dosing strength / duration / interval for maximal efficacy   2 g 200 mL/hr over 30 Minutes Intravenous Every 24 hours 08/25/21 0823 08/30/21 0959   08/25/21 1000  metroNIDAZOLE (FLAGYL) tablet 500 mg  Status:  Discontinued        500 mg Oral Every 12 hours 08/25/21 0837 08/25/21 0935   07/25/2021 2000  cefoTEtan (CEFOTAN) 2 g in sodium chloride 0.9 % 100 mL IVPB        2 g 200 mL/hr over 30 Minutes Intravenous Every 12 hours 08/14/2021 1647 07/22/2021 2046   07/27/2021 0600  cefoTEtan (CEFOTAN) 2 g in sodium chloride 0.9 % 100 mL IVPB        2 g 200 mL/hr over 30 Minutes Intravenous On call to O.R. 08/09/2021 0086 07/21/2021 0806       Assessment/Plan: s/p Procedure(s): XI ROBOTIC ASSISTED LOWER ANTERIOR RESECTION, WITH BILATERAL TAP BLOCK AND INTRAOPERATIVE ASSESSMENT USING ICG (N/A) DIVERTING ILEOSTOMY (N/A) CYSTOSCOPY with FIREFLY INJECTION AND URETHERAL DILATION (N/A) Expected post op Ileus: NPO until better bowel function, ok for minimal sips of clears, cont to monitor ostomy output, NG out 11/7 Moderate malnutrition, prolonged ileus: cont TPN Elevated WBC, purulence in JP: CT concerning for possible anastomotic dehisscence vs abscess for previous perforated rectal cancer, JP drainage catheter appears to be in appropriate position, will try switching  to Zosyn.  Repeat CT Thur if not improved.  Will try flushing drain as well Ambulate in hall, appreciate PT/OT involvement  Dehydration due to N/V: Cr back to normal, UOP good-  Cont IVF's until pt tolerating PO Cont IV pain meds - Dilaudid 1 mg IV q3h PRN.  Will transition to PO meds when ileus resolves Urinary retention: foley replaced on Mon 11/1.  foley out again 11/7, undergoing voiding trial but only 250ml recorded, will check PVR today.  May need to replace foley Dispo: pt does not have a reliable housing option at discharge.  TOC team consulted and working with housing authority to assist with d/c  planning.  Pt to make temporary housing plans     LOS: 11 days    Rosario Adie 01/22/9976

## 2021-08-26 NOTE — Care Management Important Message (Signed)
Important Message  Patient Details IM Letter given to the Patient. Name: NIHAR KLUS MRN: [id number] Date of Birth: 1956/08/17   Medicare Important Message Given:  Yes     Kerin Salen 08/26/2021, 11:43 AM

## 2021-08-27 ENCOUNTER — Other Ambulatory Visit: Payer: Self-pay

## 2021-08-27 ENCOUNTER — Other Ambulatory Visit (HOSPITAL_COMMUNITY): Payer: Self-pay

## 2021-08-27 LAB — BASIC METABOLIC PANEL
Anion gap: 8 (ref 5–15)
BUN: 23 mg/dL (ref 8–23)
CO2: 26 mmol/L (ref 22–32)
Calcium: 8.9 mg/dL (ref 8.9–10.3)
Chloride: 104 mmol/L (ref 98–111)
Creatinine, Ser: 0.85 mg/dL (ref 0.61–1.24)
GFR, Estimated: >60 mL/min (ref >60)
Glucose, Bld: 136 mg/dL — ABNORMAL HIGH (ref 70–99)
Potassium: 4.4 mmol/L (ref 3.5–5.1)
Sodium: 138 mmol/L (ref 135–145)

## 2021-08-27 LAB — GLUCOSE, CAPILLARY
Glucose-Capillary: 119 mg/dL — ABNORMAL HIGH (ref 70–99)
Glucose-Capillary: 120 mg/dL — ABNORMAL HIGH (ref 70–99)
Glucose-Capillary: 128 mg/dL — ABNORMAL HIGH (ref 70–99)
Glucose-Capillary: 130 mg/dL — ABNORMAL HIGH (ref 70–99)

## 2021-08-27 LAB — PHOSPHORUS: Phosphorus: 4.2 mg/dL (ref 2.5–4.6)

## 2021-08-27 LAB — MAGNESIUM: Magnesium: 2.5 mg/dL — ABNORMAL HIGH (ref 1.7–2.4)

## 2021-08-27 MED ORDER — INSULIN ASPART 100 UNIT/ML IJ SOLN
0.0000 [IU] | Freq: Three times a day (TID) | INTRAMUSCULAR | Status: DC
  Administered 2021-08-27 – 2021-08-29 (×4): 1 [IU] via SUBCUTANEOUS

## 2021-08-27 MED ORDER — TRAVASOL 10 % IV SOLN
INTRAVENOUS | Status: AC
  Filled 2021-08-27: qty 1020

## 2021-08-27 NOTE — Consult Note (Addendum)
[place] Nurse ostomy follow up Stoma type/location: RLQ ileostomy Stomal assessment/size:  1 and 1/8 inch ostomy, red and viable, slightly prolapsed/long Peristomal assessment: intact Treatment options for stomal/peristomal skin: skin barrier ring Output: brown effluent. 50 mls emptied from pouch Ostomy pouching: 2pc. 1 and 1/4 inch pouching system with skin barrier ring. Education provided:  Pt requested that he perform pouch change sitting up in a chair, assisted with getting out of bed and positioning patient so he can watch the process using a hand held mirror.   He was able to remove pouch, cleanse skin, cut wafer, apply barrier ring and wafer, and snap pouch to wafer without assistance.  He requests close supervision in case he makes any mistakes and requested that he be informed if any corrections need to be made.  He is able to open and close velcro to empty. Reviewed pouching routines and ordering supplies.    Patient will benefit from referral to outpatient ostomy clinic. If you agree, please refer. If [facility] is available, would also benefit from [facility].  Supplies at bedside: 5 skin barriers, 5 skin barrier rings, 5 pouches, educational booklet, pattern, stoma measuring guide. (Use barrier rings, Kellie Simmering # [phone], wafer Kellie Simmering # [id number], pouch Lawson # [id number])   Enrolled patient in [employer] Discharge program: Yes, previously   [place] nursing team will continue to follow, and will remain available to this patient, the nursing and medical teams.   Thank-you,  Julien Girt MSN, [place], [place], [place], [place]

## 2021-08-27 NOTE — Progress Notes (Signed)
Physical Therapy Treatment Patient Details Name: Phillip Brooks MRN: [id number] DOB: 01-06-1956 Today's Date: 08/27/2021   History of Present Illness Patient is 65 y.o. male s/p robotic LAR, diverting ostomy on 08/18/2021 for advanced mid rectal cancer.    PT Comments    General Comments: AxO x 3 improved cognition this session.  Pt showing Therapist how he moves (gets OOB to bathroom) and he also was able to empty his ostomy bag on his own instructing Therapist the process. General bed mobility comments: physically able to self perform and only required Supervision for safety with lines.General transfer comment: pt self able to rise from bed and toilet height with good use of hands to steady self.  Pt talking himself through the process.General Gait Details: pt self able to amb safely to and from bathroom using his walker with Supervision for safety with lines/IV pole only.  Good use of hands to steady self. Pt was able to assist self back to bed.  "I'm doing better".   Pt looking forward to his visit with Mom later and excited he can have Soda.    Recommendations for follow up therapy are one component of a multi-disciplinary discharge planning process, led by the attending physician.  Recommendations may be updated based on patient status, additional functional criteria and insurance authorization.  Follow Up Recommendations  Home health PT     Assistance Recommended at Discharge Intermittent Supervision/Assistance  Equipment Recommendations  Rolling walker (2 wheels)    Recommendations for Other Services       Precautions / Restrictions Precautions Precautions: Fall Precaution Comments: ileostomy, R JP Drain     Mobility  Bed Mobility Overal bed mobility: Needs Assistance Bed Mobility: Supine to Sit;Sit to Supine     Supine to sit: Supervision Sit to supine: Supervision   General bed mobility comments: physically able to self perform and only required Supervision for safety  with lines.    Transfers Overall transfer level: Needs assistance Equipment used: Rolling walker (2 wheels) Transfers: Sit to/from Stand Sit to Stand: Supervision           General transfer comment: pt self able to rise from bed and toilet height with good use of hands to steady self.  Pt talking himself through the process.    Ambulation/Gait Ambulation/Gait assistance: Supervision Gait Distance (Feet): 24 Feet (12 feet x 2 to and from bathroom) Assistive device: Rolling walker (2 wheels) Gait Pattern/deviations: Step-through pattern;Decreased stride length;Trunk flexed Gait velocity: decr     General Gait Details: pt self able to amb safely to and from bathroom using his walker with Supervision for safety with lines/IV pole only.  Good use of hands to steady self.   Stairs             Wheelchair Mobility    Modified Rankin (Stroke Patients Only)       Balance                                            Cognition Arousal/Alertness: Awake/alert Behavior During Therapy: WFL for tasks assessed/performed Overall Cognitive Status: No family/caregiver present to determine baseline cognitive functioning                                 General Comments: AxO x 3 improved cognition this session.  Pt showing Therapist how he moves (gets OOB to bathroom) and he also was able to empty his ostomy bag on his own instructing Therapist the process.        Exercises      General Comments        Pertinent Vitals/Pain Pain Assessment: Faces Faces Pain Scale: Hurts a little bit Pain Location: abdomen Pain Descriptors / Indicators: Grimacing Pain Intervention(s): Monitored during session;Premedicated before session;Repositioned    Home Living                          Prior Function            PT Goals (current goals can now be found in the care plan section) Progress towards PT goals: Progressing toward goals     Frequency    Min 3X/week      PT Plan Current plan remains appropriate    Co-evaluation              AM-PAC PT "6 Clicks" Mobility   Outcome Measure  Help needed turning from your back to your side while in a flat bed without using bedrails?: A Little Help needed moving from lying on your back to sitting on the side of a flat bed without using bedrails?: A Little Help needed moving to and from a bed to a chair (including a wheelchair)?: A Little Help needed standing up from a chair using your arms (e.g., wheelchair or bedside chair)?: A Little Help needed to walk in hospital room?: A Little Help needed climbing 3-5 steps with a railing? : A Little 6 Click Score: 18    End of Session Equipment Utilized During Treatment: Gait belt Activity Tolerance: Patient tolerated treatment well Patient left: in bed;with call bell/phone within reach Nurse Communication: Mobility status PT Visit Diagnosis: Muscle weakness (generalized) (M62.81);Difficulty in walking, not elsewhere classified (R26.2);Unsteadiness on feet (R26.81);Pain     Time: 7893-8101 PT Time Calculation (min) (ACUTE ONLY): 14 min  Charges:  $Gait Training: 8-22 mins                     {Kasidy Gianino  PTA Acute  Rehabilitation [employer]      [phone] Office      [phone]

## 2021-08-27 NOTE — Consult Note (Signed)
[place] Nurse ostomy follow up Attempted to perform pouch change and teaching session twice this morning, one time the patient stated he was "too tired", and now the patient states "there have been too many people coming and going and making me do things.  I want to change the bag this evening."  Informed the patient the [place] team is not available in the evening and he agrees that I should come back for a pouch change and teaching session this afternoon at 2:00. Julien Girt MSN, RN, [place], [place], [place]

## 2021-08-27 NOTE — Progress Notes (Signed)
12 Days Post-Op robotic LAR, diverting ostomy  Subjective/Chief Complaint: Pain is currently controlled, mainly lower abd pain, feels better today Ambulating some in hall, working with ostomy RN  Objective: Vital signs in last 24 hours: Temp:  [97.4 F (36.3 C)-98.3 F (36.8 C)] 97.7 F (36.5 C) (11/09 0558) Pulse Rate:  [84-96] 96 (11/09 0558) Resp:  [16-18] 18 (11/09 0558) BP: (105-112)/(66-76) 106/66 (11/09 0558) SpO2:  [98 %-100 %] 100 % (11/09 0558) Weight:  [55.2 kg] 55.2 kg (11/09 0500) Last BM Date: 08/27/21  Intake/Output from previous day: 11/08 0701 - 11/09 0700 In: 1694.9 [I.V.:1248.1; IV Piggyback:436.9] Out: 3649 [Urine:2849; Stool:800] Intake/Output this shift: Total I/O In: -  Out: 960 [Urine:550; Drains:10; Stool:400]  General appearance: alert, cooperative, and no distress GI: distended, tender in lower abdomen; ostomy pink with increased liquid stool Incisions OK, JP drain with purulent drainage  Lab Results:  Recent Labs    08/26/21 0416  WBC 18.1*  HGB 7.5*  HCT 24.1*  PLT 666*    BMET Recent Labs    08/26/21 0416 08/27/21 0412  NA 136 138  K 4.6 4.4  CL 103 104  CO2 24 26  GLUCOSE 134* 136*  BUN 18 23  CREATININE 0.78 0.85  CALCIUM 8.5* 8.9    PT/INR No results for input(s): LABPROT, INR in the last 72 hours. ABG No results for input(s): PHART, HCO3 in the last 72 hours.  Invalid input(s): PCO2, PO2  Studies/Results: No results found.  Anti-infectives: Anti-infectives (From admission, onward)    Start     Dose/Rate Route Frequency Ordered Stop   08/26/21 1000  metroNIDAZOLE (FLAGYL) IVPB 500 mg  Status:  Discontinued        500 mg 100 mL/hr over 60 Minutes Intravenous Every 12 hours 08/26/21 0830 08/26/21 0910   08/26/21 1000  piperacillin-tazobactam (ZOSYN) IVPB 3.375 g        3.375 g 12.5 mL/hr over 240 Minutes Intravenous Every 8 hours 08/26/21 0910     08/25/21 1030  metroNIDAZOLE (FLAGYL) tablet 500 mg  Status:   Discontinued        500 mg Per Tube Every 12 hours 08/25/21 0935 08/26/21 0830   08/25/21 1000  cefTRIAXone (ROCEPHIN) 2 g in sodium chloride 0.9 % 100 mL IVPB  Status:  Discontinued       Note to Pharmacy: Pharmacy may adjust dosing strength / duration / interval for maximal efficacy   2 g 200 mL/hr over 30 Minutes Intravenous Every 24 hours 08/25/21 0823 08/26/21 0910   08/25/21 1000  metroNIDAZOLE (FLAGYL) tablet 500 mg  Status:  Discontinued        500 mg Oral Every 12 hours 08/25/21 0837 08/25/21 0935   08/06/2021 2000  cefoTEtan (CEFOTAN) 2 g in sodium chloride 0.9 % 100 mL IVPB        2 g 200 mL/hr over 30 Minutes Intravenous Every 12 hours 08/11/2021 1647 07/28/2021 2046   08/07/2021 0600  cefoTEtan (CEFOTAN) 2 g in sodium chloride 0.9 % 100 mL IVPB        2 g 200 mL/hr over 30 Minutes Intravenous On call to O.R. 08/08/2021 0513 07/20/2021 0806       Assessment/Plan: s/p Procedure(s): XI ROBOTIC ASSISTED LOWER ANTERIOR RESECTION, WITH BILATERAL TAP BLOCK AND INTRAOPERATIVE ASSESSMENT USING ICG (N/A) DIVERTING ILEOSTOMY (N/A) CYSTOSCOPY with FIREFLY INJECTION AND URETHERAL DILATION (N/A) Expected post op Ileus: NPO until better bowel function, ok for minimal sips of clears, cont to monitor ostomy output,  NG out 11/7, will try sips of clears today Moderate malnutrition, prolonged ileus: cont TPN Elevated WBC, purulence in JP: CT concerning for possible anastomotic dehisscence vs abscess from previous perforated rectal cancer, JP drainage catheter appears to be in appropriate position, will cont Zosyn.  Repeat CT Thur if not improved.  Cont flushing drain q shift Ambulate in hall, appreciate PT/OT involvement  Cont IV pain meds - Dilaudid 1 mg IV q3h PRN.  Will transition to PO meds when ileus resolves Urinary retention: foley replaced on Mon 11/1.  foley out again 11/7, failed voiding trial 11/9 with elevated PVR.   Foley replaced Dispo: pt does not have a reliable housing option at discharge.   TOC team consulted and working with housing authority to assist with d/c planning.  Pt making temporary housing plans     LOS: 12 days    Rosario Adie 18/06/8420

## 2021-08-27 NOTE — Progress Notes (Signed)
Occupational Therapy Treatment Patient Details Name: Phillip Brooks MRN: [id number] DOB: [birth year] Today's Date: 08/27/2021   History of present illness Patient is 65 y.o. male s/p robotic LAR, diverting ostomy on 08/07/2021 for advanced mid rectal cancer.   OT comments  OT treatment session with focus on self-care re-education, activity tolerance and functional cognition. Patient completed 3/3 toileting tasks with Min guard and bathing in sitting/standing at sink level with Min guard and use of RW. Able to reach bilateral lower legs and feet with hip flexed. Cues for energy conservation and hand placement with sit to stand transfers. Loquacious/tangential in speech requiring frequent redirection. Patient continues to be limited by deficits listed below and would benefit from continued acute OT services in prep for safe d/c home with family. OT will continue to follow acutely.    Recommendations for follow up therapy are one component of a multi-disciplinary discharge planning process, led by the attending physician.  Recommendations may be updated based on patient status, additional functional criteria and insurance authorization.    Follow Up Recommendations  Home health OT    Assistance Recommended at Discharge Intermittent Supervision/Assistance  Equipment Recommendations  None recommended by OT    Recommendations for Other Services      Precautions / Restrictions Precautions Precautions: Fall Precaution Comments: abdominal surgery, NG tube, R iliostomy, R JP drain Restrictions Weight Bearing Restrictions: No       Mobility Bed Mobility Overal bed mobility: Needs Assistance Bed Mobility: Sit to Supine     Supine to sit: HOB elevated;Min guard     General bed mobility comments: Particular about HOB and knees of bed being elevated. Able to advance BLE from EOB to bed surface and control descent    Transfers Overall transfer level: Needs assistance Equipment used: Rolling  walker (2 wheels) Transfers: Sit to/from Stand Sit to Stand: Min guard           General transfer comment: Min guard for sit to stand x several trials from various surfaces. Continues to require cues for hand placement.     Balance Overall balance assessment: Needs assistance Sitting-balance support: Feet supported Sitting balance-Leahy Scale: Good     Standing balance support: During functional activity;Reliant on assistive device for balance;Bilateral upper extremity supported Standing balance-Leahy Scale: Fair Standing balance comment: Able to maintain static standing balance with unilateral UE support on RW during bathing tasks in standing at sink level. Able to maintain static standing balance without UE support for brief periods of time.                           ADL either performed or assessed with clinical judgement   ADL Overall ADL's : Needs assistance/impaired         Upper Body Bathing: Sitting;Set up Upper Body Bathing Details (indicate cue type and reason): Able to bathe BUE, chest and abdomen seated at sink level. Lower Body Bathing: Min guard;Sit to/from stand Lower Body Bathing Details (indicate cue type and reason): Able to wash BLE, front perineal and buttocks in sitting/standing. Able to flex hip to reach bilateral lower legs and feet. Cues for rest breaks. Upper Body Dressing : Set up;Sitting Upper Body Dressing Details (indicate cue type and reason): Donned anterior hosptial gown seated at sink level. Lower Body Dressing: Min guard;Sitting/lateral leans;Sit to/from stand;With adaptive equipment Lower Body Dressing Details (indicate cue type and reason): Able to don bilateral footwear with set-up assist without AD. Toilet Transfer: Min  guard;Rolling walker (2 wheels) Toilet Transfer Details (indicate cue type and reason): To commode in bathroom with Min guard and RW. Cues for hand placement.   Toileting - Clothing Manipulation Details (indicate  cue type and reason): Completed ostomy management with supervision A from nursing staff.     Functional mobility during ADLs: Min guard;Rolling walker (2 wheels)      Extremity/Trunk Assessment              Vision       Perception     Praxis      Cognition Arousal/Alertness: Awake/alert Behavior During Therapy: WFL for tasks assessed/performed Overall Cognitive Status: No family/caregiver present to determine baseline cognitive functioning                                 General Comments: Loquacious/tangential; requires frequent redirection. Follows 1-2 step verbal commands with fair accuracy. Able to make needs known.          Exercises     Shoulder Instructions       General Comments VSS on RA. Lines/drains intact at conclusion of session.    Pertinent Vitals/ Pain       Pain Assessment: 0-10 Pain Score: 5  Pain Location: abdomen Pain Descriptors / Indicators: Grimacing;Discomfort;Guarding;Moaning Pain Intervention(s): Limited activity within patient's tolerance;Monitored during session;Repositioned  Home Living                                          Prior Functioning/Environment              Frequency  Min 2X/week        Progress Toward Goals  OT Goals(current goals can now be found in the care plan section)  Progress towards OT goals: Progressing toward goals  Acute Rehab OT Goals Patient Stated Goal: "Set up my new home and have therapy come there." OT Goal Formulation: With patient Time For Goal Achievement: 09/03/21 Potential to Achieve Goals: Good ADL Goals Pt Will Perform Grooming: standing;with modified independence Pt Will Perform Lower Body Dressing: with modified independence;sit to/from stand Pt Will Transfer to Toilet: with modified independence;regular height toilet Pt Will Perform Toileting - Clothing Manipulation and hygiene: with modified independence;sit to/from stand  Plan Discharge  plan remains appropriate;Frequency remains appropriate    Co-evaluation                 AM-PAC OT "6 Clicks" Daily Activity     Outcome Measure   Help from another person eating meals?: None Help from another person taking care of personal grooming?: A Little Help from another person toileting, which includes using toliet, bedpan, or urinal?: A Little Help from another person bathing (including washing, rinsing, drying)?: A Little Help from another person to put on and taking off regular upper body clothing?: A Little Help from another person to put on and taking off regular lower body clothing?: A Little 6 Click Score: 19    End of Session Equipment Utilized During Treatment: Rolling walker (2 wheels)  OT Visit Diagnosis: Pain;Muscle weakness (generalized) (M62.81) Pain - part of body:  (Abdomen)   Activity Tolerance Patient tolerated treatment well   Patient Left in bed;with call bell/phone within reach;with bed alarm set   Nurse Communication Mobility status        Time: 5681-2751 OT Time Calculation (  min): 30 min  Charges: OT General Charges $OT Visit: 1 Visit OT Treatments $Self Care/Home Management : 23-37 mins  Jaylen Claude H. OTR/L Supplemental OT, Department of rehab services [phone]  Alysia Scism R H. 08/27/2021, 8:43 AM

## 2021-08-27 NOTE — Progress Notes (Signed)
PHARMACY - TOTAL PARENTERAL NUTRITION CONSULT NOTE   Indication: Prolonged ileus  Patient Measurements: Height: 5\' 6"  (167.6 cm) Weight: 55.2 kg (121 lb 11.1 oz) IBW/kg (Calculated) : 63.8 TPN AdjBW (KG): 59.7 Body mass index is 19.64 kg/m.  Assessment:  65 yo M presents on 10/28 for eval of rectal cancer. S/P LAR and diverting ileostomy.  Has been NPO for 7 days and NG tube in place with high output. Pharmacy consulted to start TPN.   Glucose / Insulin: CBGs controlled, target < 180, 3 units SSI/24 hours Electrolytes: All WNL except Mg 2.5 Renal: UOP ok, SCr < 1  Hepatic: WNL  Intake / Output; MIVF:  - D5 1/2 NS @ 10 ml/hr.  - NG out 11/7. Drain output minimal to none. 400 ml ostomy. 2849 mL UOP charted GI Imaging: 11/4 CT abdomen shows fluid collection, SBO, bladder thickening  GI Surgeries / Procedures:  10/28: Diverting ileostomy   Central access: Implanted Port TPN start date: 11/5   Nutritional Goals:  Goal TPN rate is 85 mL/hr (provides 102 g of protein and 1958 kcals per day)  RD Assessment: Estimated Needs Total Energy Estimated Needs: 1800-2000 Total Protein Estimated Needs: 85-100 grams Total Fluid Estimated Needs: >/= 1.8 L  Current Nutrition:  NPO, ok for ice chips  Plan:  Continue TPN to goal rate of 85 mL/hr at 1800 Electrolytes in TPN: Na 32mEq/L, K 70mEq/L, Ca 31mEq/L, Mg 24mEq/L, and Phos 20 mmol/L.  Cl:Ac 1:1 Add standard MVI and trace elements to TPN Reduce to Sensitive q8h SSI and adjust as needed  Continue MIVF at [facility]   Monitor TPN labs on Mon/Thurs  Napoleon Form  08/27/2021 7:58 AM

## 2021-08-27 NOTE — Progress Notes (Signed)
The proposed treatment discussed in conference is for discussion purpose only and is not a binding recommendation.  The patients have not been physically examined, or presented with their treatment options.  Therefore, final treatment plans cannot be decided.  

## 2021-08-27 NOTE — TOC Progression Note (Addendum)
Transition of Care [facility]) - Progression Note    Patient Details  Name: Phillip Brooks MRN: [id number] Date of Birth: 08/23/1956  Transition of [facility]) CM/SW Contact  Lennart Pall, LCSW Phone Number: 08/27/2021, 1:16 PM  Clinical Narrative:    Met with pt again today to discuss housing arrangements.  PT report he did much better with them today and does not meet SNF criteria.  Pt remains vague on what his family is assisting him with in regards to housing.  Pt, also, continues to ask if TOC has spoken with Gloris Manchester at [facility] who was working with him PTA on housing needs.  Have reached out again to Mr. Tresa Moore, who repeats that he does not help with emergency housing needs.  He received the requested FL2 sent by TOC SW last week.  Pt will still have to send in proof of income and have a mental health exam (CCA) completed before any further progress can be made with housing needs. (Please see TOC note from 08/20/21 for more info if needed). Pt requests that I speak with his mother about what family may be getting done on pt's behalf/ help with housing needs.  Have left a VM for mother asking that she reach me ASAP.  ADDENDUM:   Met with pt, mother and aunt this afternoon about housing needs.  Aunt has offered to have pt stay at her home in [place] and pt is agreed.  We reviewed plan for [facility] and PT which I will arrange.    Expected Discharge Plan: [place] Barriers to Discharge: Continued Medical Work up  Expected Discharge Plan and Services Expected Discharge Plan: [place] In-house Referral: Clinical Social Work     Living arrangements for the past 2 months: Single Family Home                 DME Arranged: N/A DME Agency: NA                   Social Determinants of Health (SDOH) Interventions    Readmission Risk Interventions No flowsheet data found.

## 2021-08-28 ENCOUNTER — Other Ambulatory Visit (HOSPITAL_COMMUNITY): Payer: Self-pay

## 2021-08-28 LAB — COMPREHENSIVE METABOLIC PANEL
ALT: 21 U/L (ref 0–44)
AST: 26 U/L (ref 15–41)
Albumin: 2.6 g/dL — ABNORMAL LOW (ref 3.5–5.0)
Alkaline Phosphatase: 72 U/L (ref 38–126)
Anion gap: 9 (ref 5–15)
BUN: 19 mg/dL (ref 8–23)
CO2: 23 mmol/L (ref 22–32)
Calcium: 9.2 mg/dL (ref 8.9–10.3)
Chloride: 102 mmol/L (ref 98–111)
Creatinine, Ser: 0.78 mg/dL (ref 0.61–1.24)
GFR, Estimated: >60 mL/min (ref >60)
Glucose, Bld: 121 mg/dL — ABNORMAL HIGH (ref 70–99)
Potassium: 4.7 mmol/L (ref 3.5–5.1)
Sodium: 134 mmol/L — ABNORMAL LOW (ref 135–145)
Total Bilirubin: 0.5 mg/dL (ref 0.3–1.2)
Total Protein: 7.5 g/dL (ref 6.5–8.1)

## 2021-08-28 LAB — CBC
HCT: 23.9 % — ABNORMAL LOW (ref 39.0–52.0)
Hemoglobin: 7.3 g/dL — ABNORMAL LOW (ref 13.0–17.0)
MCH: 28.5 pg (ref 26.0–34.0)
MCHC: 30.5 g/dL (ref 30.0–36.0)
MCV: 93.4 fL (ref 80.0–100.0)
Platelets: 635 10*3/uL — ABNORMAL HIGH (ref 150–400)
RBC: 2.56 MIL/uL — ABNORMAL LOW (ref 4.22–5.81)
RDW: 15.2 % (ref 11.5–15.5)
WBC: 11.8 10*3/uL — ABNORMAL HIGH (ref 4.0–10.5)
nRBC: 0 % (ref 0.0–0.2)

## 2021-08-28 LAB — GLUCOSE, CAPILLARY
Glucose-Capillary: 114 mg/dL — ABNORMAL HIGH (ref 70–99)
Glucose-Capillary: 120 mg/dL — ABNORMAL HIGH (ref 70–99)
Glucose-Capillary: 125 mg/dL — ABNORMAL HIGH (ref 70–99)
Glucose-Capillary: 129 mg/dL — ABNORMAL HIGH (ref 70–99)
Glucose-Capillary: 129 mg/dL — ABNORMAL HIGH (ref 70–99)

## 2021-08-28 LAB — PHOSPHORUS: Phosphorus: 3.9 mg/dL (ref 2.5–4.6)

## 2021-08-28 LAB — MAGNESIUM: Magnesium: 2.3 mg/dL (ref 1.7–2.4)

## 2021-08-28 MED ORDER — HYDROMORPHONE HCL 1 MG/ML IJ SOLN
1.0000 mg | INTRAMUSCULAR | Status: DC | PRN
  Administered 2021-08-28 – 2021-09-01 (×17): 1 mg via INTRAVENOUS
  Filled 2021-08-28 (×17): qty 1

## 2021-08-28 MED ORDER — TRAVASOL 10 % IV SOLN
INTRAVENOUS | Status: AC
  Filled 2021-08-28: qty 1020

## 2021-08-28 MED ORDER — OXYCODONE HCL 5 MG PO TABS
5.0000 mg | ORAL_TABLET | Freq: Four times a day (QID) | ORAL | Status: DC | PRN
  Administered 2021-08-31 – 2021-09-02 (×4): 5 mg via ORAL
  Filled 2021-08-28 (×4): qty 1

## 2021-08-28 NOTE — Progress Notes (Signed)
13 Days Post-Op robotic LAR, diverting ostomy  Subjective/Chief Complaint: Pain is currently controlled, less lower abd pain, feels better today, denies nausea with clears yesterday Ambulating in hall, working with ostomy RN  Objective: Vital signs in last 24 hours: Temp:  [97.8 F (36.6 C)-98.6 F (37 C)] 98.3 F (36.8 C) (11/10 0556) Pulse Rate:  [88-100] 91 (11/10 0556) Resp:  [18-20] 19 (11/10 0556) BP: (112-116)/(70-79) 116/70 (11/10 0556) SpO2:  [99 %-100 %] 100 % (11/10 0556) Last BM Date: 08/28/21  Intake/Output from previous day: 11/09 0701 - 11/10 0700 In: 3690.1 [P.O.:540; I.V.:2986; IV Piggyback:154.1] Out: 3260 [Urine:1700; Drains:10; LKGMW:1027] Intake/Output this shift: Total I/O In: 10 [Other:10] Out: 350 [Urine:250; Stool:100]  General appearance: alert, cooperative, and no distress GI: distended, tender in lower abdomen; ostomy pink with increased liquid stool Incisions OK, JP drain with min purulent drainage  Lab Results:  Recent Labs    08/26/21 0416 08/28/21 0430  WBC 18.1* 11.8*  HGB 7.5* 7.3*  HCT 24.1* 23.9*  PLT 666* 635*    BMET Recent Labs    08/27/21 0412 08/28/21 0430  NA 138 134*  K 4.4 4.7  CL 104 102  CO2 26 23  GLUCOSE 136* 121*  BUN 23 19  CREATININE 0.85 0.78  CALCIUM 8.9 9.2    PT/INR No results for input(s): LABPROT, INR in the last 72 hours. ABG No results for input(s): PHART, HCO3 in the last 72 hours.  Invalid input(s): PCO2, PO2  Studies/Results: No results found.  Anti-infectives: Anti-infectives (From admission, onward)    Start     Dose/Rate Route Frequency Ordered Stop   08/26/21 1000  metroNIDAZOLE (FLAGYL) IVPB 500 mg  Status:  Discontinued        500 mg 100 mL/hr over 60 Minutes Intravenous Every 12 hours 08/26/21 0830 08/26/21 0910   08/26/21 1000  piperacillin-tazobactam (ZOSYN) IVPB 3.375 g        3.375 g 12.5 mL/hr over 240 Minutes Intravenous Every 8 hours 08/26/21 0910     08/25/21 1030   metroNIDAZOLE (FLAGYL) tablet 500 mg  Status:  Discontinued        500 mg Per Tube Every 12 hours 08/25/21 0935 08/26/21 0830   08/25/21 1000  cefTRIAXone (ROCEPHIN) 2 g in sodium chloride 0.9 % 100 mL IVPB  Status:  Discontinued       Note to Pharmacy: Pharmacy may adjust dosing strength / duration / interval for maximal efficacy   2 g 200 mL/hr over 30 Minutes Intravenous Every 24 hours 08/25/21 0823 08/26/21 0910   08/25/21 1000  metroNIDAZOLE (FLAGYL) tablet 500 mg  Status:  Discontinued        500 mg Oral Every 12 hours 08/25/21 0837 08/25/21 0935   08/02/2021 2000  cefoTEtan (CEFOTAN) 2 g in sodium chloride 0.9 % 100 mL IVPB        2 g 200 mL/hr over 30 Minutes Intravenous Every 12 hours 08/09/2021 1647 07/21/2021 2046   08/14/2021 0600  cefoTEtan (CEFOTAN) 2 g in sodium chloride 0.9 % 100 mL IVPB        2 g 200 mL/hr over 30 Minutes Intravenous On call to O.R. 07/21/2021 0513 07/31/2021 0806       Assessment/Plan: s/p Procedure(s): XI ROBOTIC ASSISTED LOWER ANTERIOR RESECTION, WITH BILATERAL TAP BLOCK AND INTRAOPERATIVE ASSESSMENT USING ICG (N/A) DIVERTING ILEOSTOMY (N/A) CYSTOSCOPY with FIREFLY INJECTION AND URETHERAL DILATION (N/A) Expected post op Ileus: resolving, advance to fulls, cont to monitor ostomy output, NG out 11/7  Moderate malnutrition, prolonged ileus: cont TPN until eating well Elevated WBC, purulence in JP: CT concerning for possible anastomotic dehisscence vs abscess from previous perforated rectal cancer, JP drainage catheter appears to be in appropriate position, will cont Zosyn.  Repeat CT Thur if not improved.  Cont flushing drain q shift, wbc trending down Ambulate in hall, appreciate PT/OT involvement  Cont IV pain meds - Dilaudid 1 mg IV q4h breakthrough.  Oxy condone prn Urinary retention: foley replaced on Mon 11/1.  foley out again 11/7, failed voiding trial 11/9 with elevated PVR.   Foley replaced, will need urology f/u as outpatient Dispo: pt has a temporary  housing plan in place at discharge.  TOC team consulted and working with housing authority to assist with d/c planning.      LOS: 13 days    Phillip Brooks 50/93/2671

## 2021-08-28 NOTE — Progress Notes (Signed)
PHARMACY - TOTAL PARENTERAL NUTRITION CONSULT NOTE   Indication: Prolonged ileus  Patient Measurements: Height: 5\' 6"  (167.6 cm) Weight: 55.2 kg (121 lb 11.1 oz) IBW/kg (Calculated) : 63.8 TPN AdjBW (KG): 59.7 Body mass index is 19.64 kg/m.  Assessment:  65 yo M presents on 10/28 for eval of rectal cancer. S/P LAR and diverting ileostomy.  Has been NPO for 7 days and NG tube in place with high output. Pharmacy consulted to start TPN.   Glucose / Insulin: CBGs controlled, target < 180, 1 unit SSI/24 hours Electrolytes: Na 134, others WNL including cCa: 10.3 though calcium steadily increasing  Renal: UOP ok, SCr < 1  Hepatic: WNL  Intake / Output; MIVF:  - D5 1/2 NS @ 10 ml/hr.  - NG out 11/7. Drain output minimal to none. 1550 ml ostomy. 1700 mL UOP charted GI Imaging: 11/4 CT abdomen shows fluid collection, SBO, bladder thickening  GI Surgeries / Procedures:  10/28: Diverting ileostomy   Central access: Implanted Port TPN start date: 11/5   Nutritional Goals:  Goal TPN rate is 85 mL/hr (provides 102 g of protein and 1958 kcals per day)  RD Assessment: Estimated Needs Total Energy Estimated Needs: 1800-2000 Total Protein Estimated Needs: 85-100 grams Total Fluid Estimated Needs: >/= 1.8 L  Current Nutrition:  NPO, ok for ice chips, advancing to FLD today  Plan:  Continue TPN to goal rate of 85 mL/hr at 1800 Electrolytes in TPN: Na 58mEq/L, K 22mEq/L, Ca 32mEq/L, Mg 51mEq/L, and Phos 20 mmol/L.  Cl:Ac 1:1 Add standard MVI and trace elements to TPN Continue with Sensitive q8h SSI and adjust as needed  Continue MIVF at [facility]   Monitor TPN labs on Mon/Thurs BMET, Mg, and Phos in AM F/U diet tolerance/ability to wean TPN  Napoleon Form  08/28/2021 7:12 AM

## 2021-08-28 NOTE — Progress Notes (Signed)
Physical Therapy Treatment Patient Details Name: Phillip Brooks MRN: [id number] DOB: April 11, 1956 Today's Date: 08/28/2021   History of Present Illness Patient is 65 y.o. male s/p robotic LAR, diverting ostomy on 08/04/2021 for advanced mid rectal cancer.    PT Comments    POD # 13 General Comments: AxO x 3 very motivated and progressing well with his mobility. Pt self able to get OOB and self able to navigate lines, tubing and cath bag.  General transfer comment: pt physically self able present with good safety cognition and use od hands to steady self.General Gait Details: tolerated an increased distance in hallway with light lean on walker and slightly forward flex posture due to recent ABD surgery.  Motivated and progressing. Pt plans to return home.    Recommendations for follow up therapy are one component of a multi-disciplinary discharge planning process, led by the attending physician.  Recommendations may be updated based on patient status, additional functional criteria and insurance authorization.  Follow Up Recommendations  Home health PT     Assistance Recommended at Discharge Intermittent Supervision/Assistance  Equipment Recommendations  Rolling walker (2 wheels)    Recommendations for Other Services       Precautions / Restrictions Precautions Precautions: Fall Precaution Comments: ileostomy, R JP Drain     Mobility  Bed Mobility Overal bed mobility: Modified Independent             General bed mobility comments: pt self able with increased time    Transfers Overall transfer level: Needs assistance Equipment used: Rolling walker (2 wheels) Transfers: Sit to/from Stand Sit to Stand: Supervision           General transfer comment: pt physically self able present with good safety cognition and use od hands to steady self.    Ambulation/Gait Ambulation/Gait assistance: Supervision Gait Distance (Feet): 175 Feet Assistive device: Rolling walker (2  wheels) Gait Pattern/deviations: Step-through pattern;Decreased stride length;Trunk flexed Gait velocity: decr     General Gait Details: tolerated an increased distance in hallway with light lean on walker and slightly forward flex posture due to recent ABD surgery.  Motivated and progressing.   Stairs             Wheelchair Mobility    Modified Rankin (Stroke Patients Only)       Balance                                            Cognition Arousal/Alertness: Awake/alert Behavior During Therapy: WFL for tasks assessed/performed Overall Cognitive Status: Within Functional Limits for tasks assessed                                 General Comments: AxO x 3 very motivated and progressing well with his mobility.        Exercises      General Comments        Pertinent Vitals/Pain Pain Assessment: Faces Faces Pain Scale: Hurts a little bit Pain Location: abdomen Pain Descriptors / Indicators: Grimacing;Tender Pain Intervention(s): Monitored during session;Repositioned    Home Living                          Prior Function            PT Goals (current goals  can now be found in the care plan section) Progress towards PT goals: Progressing toward goals    Frequency    Min 3X/week      PT Plan Current plan remains appropriate    Co-evaluation              AM-PAC PT "6 Clicks" Mobility   Outcome Measure  Help needed turning from your back to your side while in a flat bed without using bedrails?: None Help needed moving from lying on your back to sitting on the side of a flat bed without using bedrails?: None Help needed moving to and from a bed to a chair (including a wheelchair)?: A Little Help needed standing up from a chair using your arms (e.g., wheelchair or bedside chair)?: A Little Help needed to walk in hospital room?: A Little Help needed climbing 3-5 steps with a railing? : A Little 6 Click  Score: 20    End of Session Equipment Utilized During Treatment: Gait belt Activity Tolerance: Patient tolerated treatment well Patient left: in bed;with call bell/phone within reach Nurse Communication: Mobility status PT Visit Diagnosis: Muscle weakness (generalized) (M62.81);Difficulty in walking, not elsewhere classified (R26.2);Unsteadiness on feet (R26.81);Pain     Time: 0915-0940 PT Time Calculation (min) (ACUTE ONLY): 25 min  Charges:  $Gait Training: 8-22 mins $Therapeutic Activity: 8-22 mins                     {Ciarrah Rae  PTA Acute  Rehabilitation Services Pager      [phone] Office      [phone]

## 2021-08-29 LAB — GLUCOSE, CAPILLARY
Glucose-Capillary: 114 mg/dL — ABNORMAL HIGH (ref 70–99)
Glucose-Capillary: 113 mg/dL — ABNORMAL HIGH (ref 70–99)
Glucose-Capillary: 130 mg/dL — ABNORMAL HIGH (ref 70–99)
Glucose-Capillary: 110 mg/dL — ABNORMAL HIGH (ref 70–99)

## 2021-08-29 LAB — MAGNESIUM: Magnesium: 2.1 mg/dL (ref 1.7–2.4)

## 2021-08-29 LAB — BASIC METABOLIC PANEL
Anion gap: 9 (ref 5–15)
BUN: 20 mg/dL (ref 8–23)
CO2: 25 mmol/L (ref 22–32)
Calcium: 9.2 mg/dL (ref 8.9–10.3)
Chloride: 101 mmol/L (ref 98–111)
Creatinine, Ser: 0.8 mg/dL (ref 0.61–1.24)
GFR, Estimated: >60 mL/min (ref >60)
Glucose, Bld: 127 mg/dL — ABNORMAL HIGH (ref 70–99)
Potassium: 4.4 mmol/L (ref 3.5–5.1)
Sodium: 135 mmol/L (ref 135–145)

## 2021-08-29 LAB — PHOSPHORUS: Phosphorus: 4.4 mg/dL (ref 2.5–4.6)

## 2021-08-29 MED ORDER — CALCIUM POLYCARBOPHIL 625 MG PO TABS
625.0000 mg | ORAL_TABLET | Freq: Three times a day (TID) | ORAL | Status: DC
  Administered 2021-08-29 – 2021-09-03 (×21): 625 mg via ORAL
  Filled 2021-08-29 (×23): qty 1

## 2021-08-29 MED ORDER — TRAVASOL 10 % IV SOLN
INTRAVENOUS | Status: AC
  Filled 2021-08-29: qty 1020

## 2021-08-29 MED ORDER — HYDROGEN PEROXIDE 3 % EX SOLN
CUTANEOUS | Status: AC
  Filled 2021-08-29: qty 473

## 2021-08-29 MED ORDER — ENSURE SURGERY PO LIQD
237.0000 mL | Freq: Two times a day (BID) | ORAL | Status: DC
  Administered 2021-08-29 – 2021-08-30 (×3): 237 mL via ORAL

## 2021-08-29 NOTE — Progress Notes (Signed)
PHARMACY - TOTAL PARENTERAL NUTRITION CONSULT NOTE   Indication: Prolonged ileus  Patient Measurements: Height: 5\' 6"  (167.6 cm) Weight: 55.2 kg (121 lb 11.1 oz) IBW/kg (Calculated) : 63.8 TPN AdjBW (KG): 59.7 Body mass index is 19.64 kg/m.  Assessment:  65 yo M presents on 10/28 for eval of rectal cancer. S/P LAR and diverting ileostomy.  Has been NPO for 7 days and NG tube in place with high output. Pharmacy consulted to start TPN.   Glucose / Insulin: CBGs controlled, target < 180, 2 units SSI/24 hours Electrolytes:  WNL including cCa: 10.3 though phosphorus increasing and calcium reduced yesterday due to calcium level approaching upper end of normal limit Renal: UOP ok, SCr < 1  Hepatic: WNL  Intake / Output; MIVF:  - No MIVF - NG out 11/7. Drain output minimal to none. 750 ml ostomy. 1275 mL UOP charted GI Imaging: 11/4 CT abdomen shows fluid collection, SBO, bladder thickening  GI Surgeries / Procedures:  10/28: Diverting ileostomy   Central access: Implanted Port TPN start date: 11/5   Nutritional Goals:  Goal TPN rate is 85 mL/hr (provides 102 g of protein and 1958 kcals per day)  RD Assessment: Estimated Needs Total Energy Estimated Needs: 1800-2000 Total Protein Estimated Needs: 85-100 grams Total Fluid Estimated Needs: >/= 1.8 L  Current Nutrition:  FLD advancing to soft diet today  Plan:  Continue TPN to goal rate of 85 mL/hr at 1800 Electrolytes in TPN: Na 85mEq/L, K 68mEq/L, Ca 28mEq/L, Mg 57mEq/L, and Phos 15 mmol/L.  Cl:Ac 1:1 Add standard MVI and trace elements to TPN Continue with Sensitive q8h SSI and adjust as needed  MIVF d/c per MD Monitor TPN labs on Mon/Thurs F/U diet tolerance/ability to wean TPN  Napoleon Form  08/29/2021 9:48 AM

## 2021-08-29 NOTE — TOC Progression Note (Signed)
Transition of Care [facility]) - Progression Note    Patient Details  Name: Phillip Brooks MRN: [id number] Date of Birth: 1956/04/03  Transition of Care [facility]) CM/SW Contact  Lennart Pall, [place] Phone Number: 08/29/2021, 3:43 PM  Clinical Narrative:    Have spoken with pt's aunt again today and she confirms plan still for pt to dc to her home address of: [place], [place]  [id number] She asks that we give her as much notice as possible of dc as she needs to plan for the travel to pick up pt.   Expected Discharge Plan: [place] Barriers to Discharge: Continued Medical Work up  Expected Discharge Plan and Services Expected Discharge Plan: [place] In-house Referral: Clinical Social Work     Living arrangements for the past 2 months: Single Family Home                 DME Arranged: N/A DME Agency: NA                   Social Determinants of Health (SDOH) Interventions    Readmission Risk Interventions No flowsheet data found.

## 2021-08-29 NOTE — Progress Notes (Signed)
Physical Therapy Treatment Patient Details Name: Phillip Brooks MRN: [id number] DOB: 03-Jan-1956 Today's Date: 08/29/2021   History of Present Illness Patient is 65 y.o. male s/p robotic LAR, diverting ostomy on 08/01/2021 for advanced mid rectal cancer.    PT Comments    POD # 14 Pt progressing well with his mobility.  Assisted OOB to amb in hallway.  General Gait Details: introdused Rollator walker to pt and he did not feel comfortable with swival mvts so stayed with regular RW.  Pt tolerated distance well.  Still present with forward flex posture due to recent ABD surgery and pain.  Requested pain meds when returned to room. Pt plans to return home and he will need a RW.   Recommendations for follow up therapy are one component of a multi-disciplinary discharge planning process, led by the attending physician.  Recommendations may be updated based on patient status, additional functional criteria and insurance authorization.  Follow Up Recommendations  Home health PT     Assistance Recommended at Discharge Intermittent Supervision/Assistance  Equipment Recommendations  Rolling walker (2 wheels)    Recommendations for Other Services       Precautions / Restrictions Precautions Precautions: Fall Precaution Comments: ileostomy, R JP Drain     Mobility  Bed Mobility Overal bed mobility: Modified Independent             General bed mobility comments: pt self able with increased time    Transfers Overall transfer level: Needs assistance Equipment used: Rolling walker (2 wheels) Transfers: Sit to/from Stand Sit to Stand: Supervision           General transfer comment: pt physically self able present with good safety cognition and use od hands to steady self.    Ambulation/Gait Ambulation/Gait assistance: Supervision Gait Distance (Feet): 135 Feet Assistive device: Rolling walker (2 wheels) Gait Pattern/deviations: Step-through pattern;Decreased stride  length;Trunk flexed Gait velocity: decr     General Gait Details: introdused Rollator walker to pt and he did not feel comfortable with swival mvts so stayed with regular RW.  Pt tolerated distance well.  Still present with forward flex posture due to recent ABD surgery and pain.  Requested pain meds when returned to room.   Stairs             Wheelchair Mobility    Modified Rankin (Stroke Patients Only)       Balance                                            Cognition Arousal/Alertness: Awake/alert Behavior During Therapy: WFL for tasks assessed/performed Overall Cognitive Status: Within Functional Limits for tasks assessed                                 General Comments: AxO x 3 very motivated and progressing well with his mobility.        Exercises      General Comments        Pertinent Vitals/Pain Pain Assessment: No/denies pain    Home Living                          Prior Function            PT Goals (current goals can now be found in the  care plan section) Progress towards PT goals: Progressing toward goals    Frequency    Min 3X/week      PT Plan Current plan remains appropriate    Co-evaluation              AM-PAC PT "6 Clicks" Mobility   Outcome Measure  Help needed turning from your back to your side while in a flat bed without using bedrails?: None Help needed moving from lying on your back to sitting on the side of a flat bed without using bedrails?: None Help needed moving to and from a bed to a chair (including a wheelchair)?: A Little Help needed standing up from a chair using your arms (e.g., wheelchair or bedside chair)?: A Little Help needed to walk in hospital room?: A Little Help needed climbing 3-5 steps with a railing? : A Little 6 Click Score: 20    End of Session Equipment Utilized During Treatment: Gait belt Activity Tolerance: Patient tolerated treatment  well;Patient limited by pain Patient left: in bed;with call bell/phone within reach Nurse Communication: Mobility status PT Visit Diagnosis: Muscle weakness (generalized) (M62.81);Difficulty in walking, not elsewhere classified (R26.2);Unsteadiness on feet (R26.81);Pain     Time: 0935-1000 PT Time Calculation (min) (ACUTE ONLY): 25 min  Charges:  $Gait Training: 8-22 mins $Therapeutic Activity: 8-22 mins                    Rica Koyanagi  PTA Acute  Rehabilitation Services Pager      [phone] Office      [phone]

## 2021-08-29 NOTE — Progress Notes (Signed)
Nutrition Follow-up  INTERVENTION:   -TPN management per Pharmacy  -Ensure Surgery PO BID, each provides 330 kcals and 18g protein    NUTRITION DIAGNOSIS:   Inadequate oral intake related to altered GI function as evidenced by NPO status.  Now on soft diet.  GOAL:   Patient will meet greater than or equal to 90% of their needs  Meeting with TPN  MONITOR:   Diet advancement, Labs, Weight trends, Skin, I & O's, Other (Comment) (TPN)  ASSESSMENT:   65 year old male who presented on 10/28 for surgery. PMH of stage III rectal cancer s/p chemotherapy and radiation.  10/28 - s/p cystoscopy with balloon dilation of bulbar urethral stricture and bilateral ureteral catheterization, lower anterior colon resection with diverting ileostomy; clear liquids 10/29 - NPO 10/30 - full liquids 10/31 - NPO 11/02 - NGT placed with significant output 11/04 - CT abdomen showing fluid collection, SBO, bladder thickening  11/7: NGT removed 11/9: CLD 11/10: FLD 11/11: Soft diet  TPN continues at 85 ml/hr, providing 1958 kcals and 102g protein.  Pt's diet advanced to soft today. Consuming very little. Ensure Surgery supplements are ordered to help supplement intakes.  Admission weight: 131 lbs. Current weight: 121 lbs  I/Os: -4.4L since admit Ostomy: 850 ml UOP: 1325 ml  Medications: Fibercon  Labs reviewed:  CBGs: 110-130  Diet Order:   Diet Order             DIET SOFT Room service appropriate? Yes; Fluid consistency: Thin  Diet effective now                   EDUCATION NEEDS:   Not appropriate for education at this time  Skin:  Skin Assessment: Skin Integrity Issues: Skin Integrity Issues:: Incisions Incisions: abdomen  Last BM:  08/22/21 175 ml x 24 hours via ileostomy  Height:   Ht Readings from Last 1 Encounters:  08/12/2021 5\' 6"  (1.676 m)    Weight:   Wt Readings from Last 1 Encounters:  08/27/21 55.2 kg    BMI:  Body mass index is 19.64  kg/m.  Estimated Nutritional Needs:   Kcal:  1950-2150  Protein:  100-110g  Fluid:  2L/day  Clayton Bibles, MS, RD, LDN Inpatient Clinical Dietitian Contact information available via Amion

## 2021-08-29 NOTE — Progress Notes (Signed)
14 Days Post-Op robotic LAR, diverting ostomy  Subjective/Chief Complaint: Pain is currently controlled, less lower abd pain, feels"like he did too much yesterday", denies nausea with fulls yesterday Ambulating in hall, working with ostomy RN  Objective: Vital signs in last 24 hours: Temp:  [97.8 F (36.6 C)-98.2 F (36.8 C)] 98.2 F (36.8 C) (11/11 0459) Pulse Rate:  [86-99] 99 (11/11 0459) Resp:  [18] 18 (11/11 0459) BP: (112-120)/(76-84) 118/76 (11/11 0459) SpO2:  [100 %] 100 % (11/11 0459) Last BM Date: 08/28/21  Intake/Output from previous day: 11/10 0701 - 11/11 0700 In: 3178.8 [P.O.:600; I.V.:2391; IV Piggyback:177.8] Out: 2025 [Urine:1275; Stool:750] Intake/Output this shift: No intake/output data recorded.  General appearance: alert, cooperative, and no distress GI: distended, tender in lower abdomen; ostomy pink with increased liquid stool Incisions OK, JP drain with min clear drainage  Lab Results:  Recent Labs    08/28/21 0430  WBC 11.8*  HGB 7.3*  HCT 23.9*  PLT 635*    BMET Recent Labs    08/28/21 0430 08/29/21 0420  NA 134* 135  K 4.7 4.4  CL 102 101  CO2 23 25  GLUCOSE 121* 127*  BUN 19 20  CREATININE 0.78 0.80  CALCIUM 9.2 9.2    PT/INR No results for input(s): LABPROT, INR in the last 72 hours. ABG No results for input(s): PHART, HCO3 in the last 72 hours.  Invalid input(s): PCO2, PO2  Studies/Results: No results found.  Anti-infectives: Anti-infectives (From admission, onward)    Start     Dose/Rate Route Frequency Ordered Stop   08/26/21 1000  metroNIDAZOLE (FLAGYL) IVPB 500 mg  Status:  Discontinued        500 mg 100 mL/hr over 60 Minutes Intravenous Every 12 hours 08/26/21 0830 08/26/21 0910   08/26/21 1000  piperacillin-tazobactam (ZOSYN) IVPB 3.375 g        3.375 g 12.5 mL/hr over 240 Minutes Intravenous Every 8 hours 08/26/21 0910     08/25/21 1030  metroNIDAZOLE (FLAGYL) tablet 500 mg  Status:  Discontinued         500 mg Per Tube Every 12 hours 08/25/21 0935 08/26/21 0830   08/25/21 1000  cefTRIAXone (ROCEPHIN) 2 g in sodium chloride 0.9 % 100 mL IVPB  Status:  Discontinued       Note to Pharmacy: Pharmacy may adjust dosing strength / duration / interval for maximal efficacy   2 g 200 mL/hr over 30 Minutes Intravenous Every 24 hours 08/25/21 0823 08/26/21 0910   08/25/21 1000  metroNIDAZOLE (FLAGYL) tablet 500 mg  Status:  Discontinued        500 mg Oral Every 12 hours 08/25/21 0837 08/25/21 0935   07/24/2021 2000  cefoTEtan (CEFOTAN) 2 g in sodium chloride 0.9 % 100 mL IVPB        2 g 200 mL/hr over 30 Minutes Intravenous Every 12 hours 07/26/2021 1647 08/05/2021 2046   07/27/2021 0600  cefoTEtan (CEFOTAN) 2 g in sodium chloride 0.9 % 100 mL IVPB        2 g 200 mL/hr over 30 Minutes Intravenous On call to O.R. 07/21/2021 0513 07/27/2021 0806       Assessment/Plan: s/p Procedure(s): XI ROBOTIC ASSISTED LOWER ANTERIOR RESECTION, WITH BILATERAL TAP BLOCK AND INTRAOPERATIVE ASSESSMENT USING ICG (N/A) DIVERTING ILEOSTOMY (N/A) CYSTOSCOPY with FIREFLY INJECTION AND URETHERAL DILATION (N/A) Expected post op Ileus: resolving, advance to soft diet, cont to monitor ostomy output, NG out 11/7 Moderate malnutrition, prolonged ileus: cont TPN until eating well  Elevated WBC, purulence in JP: CT concerning for possible anastomotic dehisscence vs abscess from previous perforated rectal cancer, JP drainage catheter appears to be in appropriate position, will cont Zosyn for 2 more days.  wbc trending down Ambulate in hall, appreciate PT/OT involvement  Cont IV pain meds - Dilaudid 1 mg IV q4h breakthrough.  Oxy condone prn Urinary retention: foley replaced on Mon 11/1.  foley out again 11/7, failed voiding trial 11/9 with elevated PVR.   Foley replaced, will need urology f/u as outpatient Dispo: pt has a temporary housing plan in place at discharge in [place] with his Aunt.  TOC team consulted and working with housing  authority to assist with long term housing.      LOS: 14 days    Rosario Adie 90/[id number]/3014

## 2021-08-29 NOTE — Plan of Care (Signed)

## 2021-08-30 LAB — GLUCOSE, CAPILLARY: Glucose-Capillary: 118 mg/dL — ABNORMAL HIGH (ref 70–99)

## 2021-08-30 MED ORDER — TRAVASOL 10 % IV SOLN
INTRAVENOUS | Status: DC
  Filled 2021-08-30: qty 1020

## 2021-08-30 NOTE — Progress Notes (Signed)
Patient able to empty and care for ileostomy with success. Encouraged oral fluids and likes the vanilla ensure(surgery) Ambulates with front wheel roller walker. IVF's and TPN infusing without difficulty.   R JP no output. Charged and drain gauze placed over R flank site. Call light and phone in reach. Rounds continued per unit protocol and MD orders.

## 2021-08-30 NOTE — Progress Notes (Signed)
Physical Therapy Treatment Patient Details Name: Phillip Brooks MRN: [id number] DOB: 23-Jul-1956 Today's Date: 08/30/2021   History of Present Illness Patient is 65 y.o. male s/p robotic LAR, diverting ostomy on 07/31/2021 for advanced mid rectal cancer.    PT Comments    POD #15 Pt progressing well with his mobility.  General Comments: AxO x 3 very motivated and progressing well with his mobility. Retired Administrator. Pt self able to rise OOB and get back into bed with increased time.  General transfer comment: pt physically self able present with good safety cognition and use od hands to steady self.  General Gait Details: decreased amb distance due to increased c/o pain mid way through amb.  Pt starting to eat soft foods.  Pain meds requested. Pt plans to D/C to Aunt's house in [place], [place].   Recommendations for follow up therapy are one component of a multi-disciplinary discharge planning process, led by the attending physician.  Recommendations may be updated based on patient status, additional functional criteria and insurance authorization.  Follow Up Recommendations  Home health PT     Assistance Recommended at Discharge Intermittent Supervision/Assistance  Equipment Recommendations  Rolling walker (2 wheels)    Recommendations for Other Services       Precautions / Restrictions Precautions Precaution Comments: ileostomy, R JP Drain     Mobility  Bed Mobility Overal bed mobility: Modified Independent             General bed mobility comments: pt self able with increased time    Transfers Overall transfer level: Needs assistance Equipment used: Rolling walker (2 wheels) Transfers: Sit to/from Stand Sit to Stand: Supervision           General transfer comment: pt physically self able present with good safety cognition and use od hands to steady self.    Ambulation/Gait Ambulation/Gait assistance: Supervision Gait Distance (Feet): 93 Feet Assistive  device: Rolling walker (2 wheels) Gait Pattern/deviations: Step-through pattern;Decreased stride length;Trunk flexed       General Gait Details: decreased amb distance due to increased c/o pain mid way through amb.  Pt starting to eat soft foods.  Pain meds requested.   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        Exercises      General Comments        Pertinent Vitals/Pain Pain Assessment: Faces Faces Pain Scale: Hurts little more Pain Location: abdomen Pain Descriptors / Indicators: Grimacing;Tender Pain Intervention(s): Monitored during session;Patient requesting pain meds-RN notified;Repositioned    Home Living                          Prior Function  PT Goals (current goals can now be found in the care plan section) Progress towards PT goals: Progressing toward goals    Frequency    Min 3X/week      PT Plan Current plan remains appropriate    Co-evaluation              AM-PAC PT "6 Clicks" Mobility   Outcome Measure  Help needed turning from your back to your side while in a flat bed without using bedrails?: None Help needed moving from lying on your back to sitting on the side of a flat bed without using bedrails?: None Help needed moving to and from a bed to a chair (including a wheelchair)?: A Little Help needed standing up from a chair using your arms (e.g., wheelchair or bedside chair)?: A Little Help needed to walk in hospital room?: A Little Help needed climbing 3-5 steps  with a railing? : A Little 6 Click Score: 20    End of Session Equipment Utilized During Treatment: Gait belt Activity Tolerance: Patient tolerated treatment well;Patient limited by pain Patient left: in bed;with call bell/phone within reach;with family/visitor present (Pt's Mother and Aunt in room) Nurse Communication: Mobility status PT Visit Diagnosis: Muscle weakness (generalized) (M62.81);Difficulty in walking, not elsewhere classified (R26.2);Unsteadiness on feet (R26.81);Pain     Time: 8299-3716 PT Time Calculation (min) (ACUTE ONLY): 26 min  Charges:  $Therapeutic Activity: 8-22 mins                     Rica Koyanagi  PTA Acute  Rehabilitation Services Pager      [phone] Office      [phone]

## 2021-08-30 NOTE — Progress Notes (Signed)
PHARMACY - TOTAL PARENTERAL NUTRITION CONSULT NOTE   Indication: Prolonged ileus  Patient Measurements: Height: 5\' 6"  (167.6 cm) Weight: 61.2 kg (134 lb 14.7 oz) IBW/kg (Calculated) : 63.8 TPN AdjBW (KG): 59.7 Body mass index is 21.78 kg/m.  Assessment:  65 yo M presents on 10/28 for eval of rectal cancer. S/P LAR and diverting ileostomy. Has been NPO for 7 days and NG tube in place with high output. Pharmacy consulted to start TPN.   Glucose / Insulin: CBGs controlled, target < 180, 1 unit SSI/24 hours Electrolytes: (11/11) all WNL; Ca trending up slowly; others stable Renal: SCr, BUN, bicarb all stable WNL; UOP excellent Hepatic: Albumin remains low but stable; LFTs, Tbili, TG stable WNL  Intake / Output; MIVF:  - No MIVF - NGT removed; drain OP negligible; stool trending slowly down GI Imaging: 11/4 CT abdomen shows fluid collection, SBO, bladder thickening  GI Surgeries / Procedures:  10/28: Diverting ileostomy   Central access: Implanted Port TPN start date: 11/5   Nutritional Goals:  Goal TPN rate is 85 mL/hr (provides 102 g of protein and 1958 kcals per day)  RD Assessment: Estimated Needs Total Energy Estimated Needs: 1950-2150 Total Protein Estimated Needs: 100-110g Total Fluid Estimated Needs: 2L/day  Current Nutrition:  FLD advancing to soft diet today  Plan:  Continue TPN to goal rate of 85 mL/hr at 1800 Electrolytes in TPN: no changes Na 82mEq/L K 41mEq/L Ca 72mEq/L Mg 7mEq/L Phos 15 mmol/L.  Cl:Ac 1:1 Add standard MVI and trace elements to TPN D/C SSI with good control and minimal correction MIVF d/c per MD Monitor TPN labs on Mon/Thurs F/U diet tolerance/ability to wean TPN  Laurel Smeltz A  08/30/2021 9:59 AM

## 2021-08-30 NOTE — Progress Notes (Signed)
15 Days Post-Op   Subjective/Chief Complaint: Comfortable this morning Still working on PO intake   Objective: Vital signs in last 24 hours: Temp:  [98.2 F (36.8 C)-98.3 F (36.8 C)] 98.3 F (36.8 C) (11/12 0454) Pulse Rate:  [87-89] 89 (11/12 0454) Resp:  [16-20] 16 (11/12 0454) BP: (105-118)/(71-78) 105/71 (11/12 0454) SpO2:  [100 %] 100 % (11/12 0454) Weight:  [61.2 kg] 61.2 kg (11/12 0449) Last BM Date: 08/30/21  Intake/Output from previous day: 11/11 0701 - 11/12 0700 In: 2766.3 [P.O.:537; I.V.:2069.3; IV Piggyback:150] Out: 2255 [Urine:1650; Drains:5; Stool:600] Intake/Output this shift: No intake/output data recorded.  Exam: Awake and alert Abdomen softer, minimally tender, ostomy pink, Drain serous  Lab Results:  Recent Labs    08/28/21 0430  WBC 11.8*  HGB 7.3*  HCT 23.9*  PLT 635*   BMET Recent Labs    08/28/21 0430 08/29/21 0420  NA 134* 135  K 4.7 4.4  CL 102 101  CO2 23 25  GLUCOSE 121* 127*  BUN 19 20  CREATININE 0.78 0.80  CALCIUM 9.2 9.2   PT/INR No results for input(s): LABPROT, INR in the last 72 hours. ABG No results for input(s): PHART, HCO3 in the last 72 hours.  Invalid input(s): PCO2, PO2  Studies/Results: No results found.  Anti-infectives: Anti-infectives (From admission, onward)    Start     Dose/Rate Route Frequency Ordered Stop   08/26/21 1000  metroNIDAZOLE (FLAGYL) IVPB 500 mg  Status:  Discontinued        500 mg 100 mL/hr over 60 Minutes Intravenous Every 12 hours 08/26/21 0830 08/26/21 0910   08/26/21 1000  piperacillin-tazobactam (ZOSYN) IVPB 3.375 g        3.375 g 12.5 mL/hr over 240 Minutes Intravenous Every 8 hours 08/26/21 0910     08/25/21 1030  metroNIDAZOLE (FLAGYL) tablet 500 mg  Status:  Discontinued        500 mg Per Tube Every 12 hours 08/25/21 0935 08/26/21 0830   08/25/21 1000  cefTRIAXone (ROCEPHIN) 2 g in sodium chloride 0.9 % 100 mL IVPB  Status:  Discontinued       Note to Pharmacy:  Pharmacy may adjust dosing strength / duration / interval for maximal efficacy   2 g 200 mL/hr over 30 Minutes Intravenous Every 24 hours 08/25/21 0823 08/26/21 0910   08/25/21 1000  metroNIDAZOLE (FLAGYL) tablet 500 mg  Status:  Discontinued        500 mg Oral Every 12 hours 08/25/21 0837 08/25/21 0935   07/22/2021 2000  cefoTEtan (CEFOTAN) 2 g in sodium chloride 0.9 % 100 mL IVPB        2 g 200 mL/hr over 30 Minutes Intravenous Every 12 hours 08/06/2021 1647 07/23/2021 2046   08/10/2021 0600  cefoTEtan (CEFOTAN) 2 g in sodium chloride 0.9 % 100 mL IVPB        2 g 200 mL/hr over 30 Minutes Intravenous On call to O.R. 08/01/2021 2505 08/01/2021 0806       Assessment/Plan: s/p Procedure(s): XI ROBOTIC ASSISTED LOWER ANTERIOR RESECTION, WITH BILATERAL TAP BLOCK AND INTRAOPERATIVE ASSESSMENT USING ICG (N/A) DIVERTING ILEOSTOMY (N/A) CYSTOSCOPY with FIREFLY INJECTION AND URETHERAL DILATION (N/A)   Continuing current care Dietician following closely.  Hope to start weening TPN soon   LOS: 15 days    Coralie Keens 08/30/2021

## 2021-08-31 MED ORDER — TRAVASOL 10 % IV SOLN
INTRAVENOUS | Status: AC
  Filled 2021-08-31: qty 480

## 2021-08-31 MED ORDER — TRAVASOL 10 % IV SOLN: INTRAVENOUS | Status: DC

## 2021-08-31 MED ORDER — TRAVASOL 10 % IV SOLN
INTRAVENOUS | Status: AC
  Filled 2021-08-31: qty 1020

## 2021-08-31 NOTE — TOC Progression Note (Signed)
Transition of Care [facility]) - Progression Note    Patient Details  Name: Phillip Brooks MRN: [id number] Date of Birth: August 03, 1956  Transition of Care [facility]) CM/SW Contact  Ross Ludwig, [place] Phone Number: 08/31/2021, 3:19 PM  Clinical Narrative:     CSW continuing to follow patient's progress.  Per TOC note patient will be going to his Aunt's house in [place], per physician note, patient will need [facility] RN and PT,  CSW spoke to [place] at [place] they do cover that location and can accept patient once he is ready for discharge.  CSW also contacted Carolynn Sayers at [facility] infusion and asked he to follow patient in case he needs to go home with TPN.  CSW to continue to follow patient's progress throughout discharge planning.   Expected Discharge Plan: [place] Barriers to Discharge: Continued Medical Work up  Expected Discharge Plan and Services Expected Discharge Plan: [place] In-house Referral: Clinical Social Work     Living arrangements for the past 2 months: Single Family Home                 DME Arranged: N/A DME Agency: NA                   Social Determinants of Health (SDOH) Interventions    Readmission Risk Interventions No flowsheet data found.

## 2021-08-31 NOTE — Progress Notes (Signed)
16 Days Post-Op   Subjective/Chief Complaint: Pt reports doing much better with po intake Ambulating well Pain controlled   Objective: Vital signs in last 24 hours: Temp:  [98 F (36.7 C)-98.6 F (37 C)] 98 F (36.7 C) (11/13 0545) Pulse Rate:  [90-95] 90 (11/13 0545) Resp:  [18] 18 (11/13 0545) BP: (116-117)/(70-73) 117/70 (11/13 0545) SpO2:  [99 %-100 %] 99 % (11/13 0545) Last BM Date: 08/30/21  Intake/Output from previous day: 11/12 0701 - 11/13 0700 In: 2980 [P.O.:1080; I.V.:1800.8; IV Piggyback:99.2] Out: 4034 [Urine:1900; VQQVZ:5638] Intake/Output this shift: Total I/O In: -  Out: 125 [Stool:125]  Exam: Awake and alert, comfortable Abdomen soft, ostomy working well Drain serous  Lab Results:  No results for input(s): WBC, HGB, HCT, PLT in the last 72 hours. BMET Recent Labs    08/29/21 0420  NA 135  K 4.4  CL 101  CO2 25  GLUCOSE 127*  BUN 20  CREATININE 0.80  CALCIUM 9.2   PT/INR No results for input(s): LABPROT, INR in the last 72 hours. ABG No results for input(s): PHART, HCO3 in the last 72 hours.  Invalid input(s): PCO2, PO2  Studies/Results: No results found.  Anti-infectives: Anti-infectives (From admission, onward)    Start     Dose/Rate Route Frequency Ordered Stop   08/26/21 1000  metroNIDAZOLE (FLAGYL) IVPB 500 mg  Status:  Discontinued        500 mg 100 mL/hr over 60 Minutes Intravenous Every 12 hours 08/26/21 0830 08/26/21 0910   08/26/21 1000  piperacillin-tazobactam (ZOSYN) IVPB 3.375 g        3.375 g 12.5 mL/hr over 240 Minutes Intravenous Every 8 hours 08/26/21 0910     08/25/21 1030  metroNIDAZOLE (FLAGYL) tablet 500 mg  Status:  Discontinued        500 mg Per Tube Every 12 hours 08/25/21 0935 08/26/21 0830   08/25/21 1000  cefTRIAXone (ROCEPHIN) 2 g in sodium chloride 0.9 % 100 mL IVPB  Status:  Discontinued       Note to Pharmacy: Pharmacy may adjust dosing strength / duration / interval for maximal efficacy   2 g 200  mL/hr over 30 Minutes Intravenous Every 24 hours 08/25/21 0823 08/26/21 0910   08/25/21 1000  metroNIDAZOLE (FLAGYL) tablet 500 mg  Status:  Discontinued        500 mg Oral Every 12 hours 08/25/21 0837 08/25/21 0935   08/18/2021 2000  cefoTEtan (CEFOTAN) 2 g in sodium chloride 0.9 % 100 mL IVPB        2 g 200 mL/hr over 30 Minutes Intravenous Every 12 hours 08/07/2021 1647 07/24/2021 2046   08/05/2021 0600  cefoTEtan (CEFOTAN) 2 g in sodium chloride 0.9 % 100 mL IVPB        2 g 200 mL/hr over 30 Minutes Intravenous On call to O.R. 07/22/2021 0513 08/02/2021 0806       Assessment/Plan: s/p Procedure(s): XI ROBOTIC ASSISTED LOWER ANTERIOR RESECTION, WITH BILATERAL TAP BLOCK AND INTRAOPERATIVE ASSESSMENT USING ICG (N/A) DIVERTING ILEOSTOMY (N/A) CYSTOSCOPY with FIREFLY INJECTION AND URETHERAL DILATION (N/A)  Po intake improve Start weening TNA off  LOS: 16 days    Coralie Keens MD 08/31/2021

## 2021-08-31 NOTE — Progress Notes (Signed)
PHARMACY - TOTAL PARENTERAL NUTRITION CONSULT NOTE   Indication: Prolonged ileus  Patient Measurements: Height: 5\' 6"  (167.6 cm) Weight: 61.2 kg (134 lb 14.7 oz) IBW/kg (Calculated) : 63.8 TPN AdjBW (KG): 59.7 Body mass index is 21.78 kg/m.  Assessment:  65 yo M presents on 10/28 for eval of rectal cancer. S/P LAR and diverting ileostomy. Has been NPO for 7 days and NG tube in place with high output. Pharmacy consulted to start TPN.   11/13: much better PO intake; start weaning TPN per Surgery  Glucose / Insulin: No Hx DM - SSI stopped; BG well controlled on full-rate TPN Electrolytes: (11/11) all WNL; Ca trending up slowly; others stable Renal: SCr, BUN, bicarb all stable WNL; UOP excellent Hepatic: Albumin remains low but stable; LFTs, Tbili, TG stable WNL  Intake / Output; MIVF:  - No MIVF - NGT removed; drain OP negligible; stool trending slowly down GI Imaging: 11/4 CT abdomen shows fluid collection, SBO, bladder thickening  GI Surgeries / Procedures:  10/28: Diverting ileostomy   Central access: Implanted Port TPN start date: 11/5   Nutritional Goals:  Goal TPN rate is 85 mL/hr (provides 102 g of protein and 1958 kcals per day)  RD Assessment: Estimated Needs Total Energy Estimated Needs: 1950-2150 Total Protein Estimated Needs: 100-110g Total Fluid Estimated Needs: 2L/day  Current Nutrition:  FLD advancing to soft diet today  Plan:  Reduce TPN to 40 mL/hr at 1800 Electrolytes in TPN: no changes Na 89mEq/L K 35mEq/L Ca 14mEq/L Mg 56mEq/L Phos 15 mmol/L.  Cl:Ac 1:1 Add standard MVI and trace elements to TPN No SSI MIVF d/c per MD Monitor TPN labs on Mon/Thurs  Reuel Boom, PharmD, BCPS [phone] 08/31/2021, 11:12 AM

## 2021-09-01 ENCOUNTER — Encounter (HOSPITAL_COMMUNITY): Payer: Self-pay | Admitting: General Surgery

## 2021-09-01 ENCOUNTER — Other Ambulatory Visit: Payer: Self-pay

## 2021-09-01 ENCOUNTER — Inpatient Hospital Stay: Payer: Medicare HMO

## 2021-09-01 ENCOUNTER — Other Ambulatory Visit (HOSPITAL_COMMUNITY): Payer: Self-pay

## 2021-09-01 ENCOUNTER — Inpatient Hospital Stay: Payer: Medicare HMO | Admitting: Oncology

## 2021-09-01 DIAGNOSIS — C20 Malignant neoplasm of rectum: Secondary | ICD-10-CM

## 2021-09-01 LAB — CBC
HCT: 23.1 % — ABNORMAL LOW (ref 39.0–52.0)
Hemoglobin: 7.1 g/dL — ABNORMAL LOW (ref 13.0–17.0)
MCH: 27.7 pg (ref 26.0–34.0)
MCHC: 30.7 g/dL (ref 30.0–36.0)
MCV: 90.2 fL (ref 80.0–100.0)
Platelets: 707 10*3/uL — ABNORMAL HIGH (ref 150–400)
RBC: 2.56 MIL/uL — ABNORMAL LOW (ref 4.22–5.81)
RDW: 15.2 % (ref 11.5–15.5)
WBC: 9.9 10*3/uL (ref 4.0–10.5)
nRBC: 0 % (ref 0.0–0.2)

## 2021-09-01 LAB — PHOSPHORUS: Phosphorus: 4.7 mg/dL — ABNORMAL HIGH (ref 2.5–4.6)

## 2021-09-01 LAB — COMPREHENSIVE METABOLIC PANEL
ALT: 53 U/L — ABNORMAL HIGH (ref 0–44)
AST: 39 U/L (ref 15–41)
Albumin: 2.6 g/dL — ABNORMAL LOW (ref 3.5–5.0)
Alkaline Phosphatase: 127 U/L — ABNORMAL HIGH (ref 38–126)
Anion gap: 7 (ref 5–15)
BUN: 21 mg/dL (ref 8–23)
CO2: 26 mmol/L (ref 22–32)
Calcium: 9.3 mg/dL (ref 8.9–10.3)
Chloride: 100 mmol/L (ref 98–111)
Creatinine, Ser: 0.94 mg/dL (ref 0.61–1.24)
GFR, Estimated: >60 mL/min (ref >60)
Glucose, Bld: 107 mg/dL — ABNORMAL HIGH (ref 70–99)
Potassium: 4 mmol/L (ref 3.5–5.1)
Sodium: 133 mmol/L — ABNORMAL LOW (ref 135–145)
Total Bilirubin: 0.5 mg/dL (ref 0.3–1.2)
Total Protein: 7.9 g/dL (ref 6.5–8.1)

## 2021-09-01 LAB — MAGNESIUM: Magnesium: 2.2 mg/dL (ref 1.7–2.4)

## 2021-09-01 LAB — TRIGLYCERIDES: Triglycerides: 69 mg/dL (ref <150)

## 2021-09-01 MED ORDER — BOOST / RESOURCE BREEZE PO LIQD CUSTOM
1.0000 | Freq: Two times a day (BID) | ORAL | Status: DC
  Administered 2021-09-01: 1 via ORAL

## 2021-09-01 MED ORDER — FERROUS SULFATE 325 (65 FE) MG PO TABS
325.0000 mg | ORAL_TABLET | Freq: Three times a day (TID) | ORAL | Status: DC
  Administered 2021-09-01 – 2021-09-03 (×8): 325 mg via ORAL
  Filled 2021-09-01 (×8): qty 1

## 2021-09-01 MED ORDER — HYDROMORPHONE HCL 1 MG/ML IJ SOLN
1.0000 mg | Freq: Four times a day (QID) | INTRAMUSCULAR | Status: DC | PRN
  Administered 2021-09-01 – 2021-09-02 (×3): 1 mg via INTRAVENOUS
  Filled 2021-09-01 (×3): qty 1

## 2021-09-01 MED ORDER — TRAVASOL 10 % IV SOLN
INTRAVENOUS | Status: AC
  Filled 2021-09-01: qty 480

## 2021-09-01 MED ORDER — ADULT MULTIVITAMIN W/MINERALS CH
1.0000 | ORAL_TABLET | Freq: Every day | ORAL | Status: DC
  Administered 2021-09-01 – 2021-09-02 (×2): 1 via ORAL
  Filled 2021-09-01 (×2): qty 1

## 2021-09-01 NOTE — Consult Note (Addendum)
[place] Nurse ostomy follow up Attempted to perform pouch change and teaching session; Pt states he is tired and wants to change the bag later. Informed the patient the [place] team is not available in the evening and he agrees that I should come back for a pouch change and teaching session this afternoon at 2:00. Julien Girt MSN, RN, [place], [place], [place]

## 2021-09-01 NOTE — Progress Notes (Signed)
17 Days Post-Op robotic LAR, diverting ostomy  Subjective/Chief Complaint:  less lower abd pain, tolerating some solid food, c/o bloating after meals Ambulating in hall, working with ostomy RN  Objective: Vital signs in last 24 hours: Temp:  [98 F (36.7 C)-98.5 F (36.9 C)] 98.5 F (36.9 C) (11/14 0500) Pulse Rate:  [70-88] 70 (11/14 0500) Resp:  [14-18] 18 (11/14 0500) BP: (108-118)/(71-81) 118/81 (11/14 0500) SpO2:  [98 %-100 %] 98 % (11/14 0500) Last BM Date: 08/31/21  Intake/Output from previous day: 11/13 0701 - 11/14 0700 In: 2153.6 [P.O.:1500; I.V.:513.2; IV Piggyback:140.3] Out: 2070 [Urine:940; Drains:5; QMGQQ:7619] Intake/Output this shift: No intake/output data recorded.  General appearance: alert, cooperative, and no distress GI: distended, tender in lower abdomen; ostomy pink with increased liquid stool Incisions OK, JP drain with min clear drainage  Lab Results:  Recent Labs    09/01/21 0417  WBC 9.9  HGB 7.1*  HCT 23.1*  PLT 707*    BMET Recent Labs    09/01/21 0417  NA 133*  K 4.0  CL 100  CO2 26  GLUCOSE 107*  BUN 21  CREATININE 0.94  CALCIUM 9.3    PT/INR No results for input(s): LABPROT, INR in the last 72 hours. ABG No results for input(s): PHART, HCO3 in the last 72 hours.  Invalid input(s): PCO2, PO2  Studies/Results: No results found.  Anti-infectives: Anti-infectives (From admission, onward)    Start     Dose/Rate Route Frequency Ordered Stop   08/26/21 1000  metroNIDAZOLE (FLAGYL) IVPB 500 mg  Status:  Discontinued        500 mg 100 mL/hr over 60 Minutes Intravenous Every 12 hours 08/26/21 0830 08/26/21 0910   08/26/21 1000  piperacillin-tazobactam (ZOSYN) IVPB 3.375 g  Status:  Discontinued        3.375 g 12.5 mL/hr over 240 Minutes Intravenous Every 8 hours 08/26/21 0910 09/01/21 0819   08/25/21 1030  metroNIDAZOLE (FLAGYL) tablet 500 mg  Status:  Discontinued        500 mg Per Tube Every 12 hours 08/25/21 0935  08/26/21 0830   08/25/21 1000  cefTRIAXone (ROCEPHIN) 2 g in sodium chloride 0.9 % 100 mL IVPB  Status:  Discontinued       Note to Pharmacy: Pharmacy may adjust dosing strength / duration / interval for maximal efficacy   2 g 200 mL/hr over 30 Minutes Intravenous Every 24 hours 08/25/21 0823 08/26/21 0910   08/25/21 1000  metroNIDAZOLE (FLAGYL) tablet 500 mg  Status:  Discontinued        500 mg Oral Every 12 hours 08/25/21 0837 08/25/21 0935   07/26/2021 2000  cefoTEtan (CEFOTAN) 2 g in sodium chloride 0.9 % 100 mL IVPB        2 g 200 mL/hr over 30 Minutes Intravenous Every 12 hours 07/22/2021 1647 07/28/2021 2046   07/21/2021 0600  cefoTEtan (CEFOTAN) 2 g in sodium chloride 0.9 % 100 mL IVPB        2 g 200 mL/hr over 30 Minutes Intravenous On call to O.R. 08/07/2021 0513 08/14/2021 0806       Assessment/Plan: s/p Procedure(s): XI ROBOTIC ASSISTED LOWER ANTERIOR RESECTION, WITH BILATERAL TAP BLOCK AND INTRAOPERATIVE ASSESSMENT USING ICG (N/A) DIVERTING ILEOSTOMY (N/A) CYSTOSCOPY with FIREFLY INJECTION AND URETHERAL DILATION (N/A) Expected post op Ileus: resolving, advance to soft diet, cont to monitor ostomy output, NG out 11/7 Moderate malnutrition, prolonged ileus: cont TPN until eating well Elevated WBC, purulence in JP: CT concerning for possible anastomotic  dehisscence vs abscess from previous perforated rectal cancer, JP drainage catheter appears to be in appropriate position, d/c Zosyn.  wbc trending down Ambulate in hall, appreciate PT/OT involvement  Cont IV pain meds - Dilaudid 1 mg IV q4h breakthrough.  Oxy condone prn Urinary retention: foley replaced on Mon 11/1.  foley out again 11/7, failed voiding trial 11/9 with elevated PVR.   Foley replaced, will need urology f/u as outpatient Dispo: pt has a temporary housing plan in place at discharge in [place] with his Aunt.  TOC team consulted and working with housing authority to assist with long term housing.      LOS: 17 days     Phillip Brooks 21/58/7276

## 2021-09-01 NOTE — Progress Notes (Signed)
PHARMACY - TOTAL PARENTERAL NUTRITION CONSULT NOTE   Indication: Prolonged ileus  Patient Measurements: Height: '5\' 6"'  (167.6 cm) Weight: 61.2 kg (134 lb 14.7 oz) IBW/kg (Calculated) : 63.8 TPN AdjBW (KG): 59.7 Body mass index is 21.78 kg/m.  Assessment:  65 yo M presents on 10/28 for eval of rectal cancer. S/P LAR and diverting ileostomy. Has been NPO for 7 days and NG tube in place with high output. Pharmacy consulted to start TPN.   11/13: much better PO intake; start weaning TPN per Surgery 11/14 CCS "cont TPN until eating well"  Glucose / Insulin: No Hx DM - SSI stopped; BG well controlled on full-rate TPN, remains WNL Electrolytes:  Na 133, Phos slightly elevated 4.7; CoCa 10.42 - sl elevated Renal: SCr, BUN, bicarb all stable WNL;  Hepatic: Albumin remains low but stable; ALT & Alk phos slightly elevated, TG stable WNL  Intake / Output; MIVF:  - No MIVF - NGT removed; drain OP negligible; stool 1125/24 hrs. UOP 940 ml/h24 hr GI Imaging: 11/4 CT abdomen shows fluid collection, SBO, bladder thickening  GI Surgeries / Procedures:  10/28: Diverting ileostomy   Central access: Implanted Port TPN start date: 11/5   Nutritional Goals:  Goal TPN rate is 85 mL/hr (provides 102 g of protein and 1958 kcals per day)  RD Assessment: Estimated Needs Total Energy Estimated Needs: 1950-2150 Total Protein Estimated Needs: 100-110g Total Fluid Estimated Needs: 2L/day  Current Nutrition:  soft diet  TPN  Plan:  Continue TPN @ 40 mL/hr at 1800 Electrolytes in TPN: no changes Na 88mq/L K 531m/L Ca 0 mEq/L Mg 92m66mL Phos 10 mmol/L.  Cl:Ac 1:1 Change to PO MVI No SSI MIVF d/c per MD Monitor TPN labs on Mon/Thurs F/u diet and ability to stop TPN  [email or web address] [email or web address] 09/01/2021 8:48 AM

## 2021-09-01 NOTE — Progress Notes (Signed)
Nutrition Follow-up  DOCUMENTATION CODES:   Severe malnutrition in context of chronic illness  INTERVENTION:   -TPN management per Pharmacy  -Ensure Surgery PO BID, each provides 330 kcals and 18g protein   -Boost Breeze po BID, each supplement provides 250 kcal and 9 grams of protein    NEW NUTRITION DIAGNOSIS:   Severe Malnutrition related to chronic illness, cancer and cancer related treatments as evidenced by severe fat depletion, severe muscle depletion.   GOAL:   Patient will meet greater than or equal to 90% of their needs  Progressing  MONITOR:   Diet advancement, Labs, Weight trends, Skin, I & O's, Other (Comment) (TPN) PO  ASSESSMENT:   65 year old male who presented on 10/28 for surgery. PMH of stage III rectal cancer s/p chemotherapy and radiation.  10/28 - s/p cystoscopy with balloon dilation of bulbar urethral stricture and bilateral ureteral catheterization, lower anterior colon resection with diverting ileostomy; clear liquids 10/29 - NPO 10/30 - full liquids 10/31 - NPO 11/02 - NGT placed with significant output 11/04 - CT abdomen showing fluid collection, SBO, bladder thickening  11/7: NGT removed 11/9: CLD 11/10: FLD 11/11: Soft diet  TPN continues at 40 ml/hr, providing 921 kcals and 48g protein.   Pt consuming very little of meals. States he takes 2 bites and feels very full. Ensure Surgery supplements are ordered to help supplement intakes. Hasn't been drinking them but states he will  today. Also interested in trying peach flavor Boost Breeze as well.   States he hasn't eaten well since September this year. Was drinking beer to "fill up". Pt figuring out his living situation at discharge as well.   Admission weight: 131 lbs. Current weight: 134 lbs  Medications: Ferrous sulfate, Multivitamin with minerals daily, Fibercon  Labs reviewed:  CBGs: 110-130 Low Na Elevated Phos 4.7  NUTRITION - FOCUSED PHYSICAL EXAM:  Flowsheet Row Most  Recent Value  Orbital Region Severe depletion  Upper Arm Region Severe depletion  Thoracic and Lumbar Region Unable to assess  Buccal Region Severe depletion  Temple Region Severe depletion  Clavicle Bone Region Severe depletion  Clavicle and Acromion Bone Region Severe depletion  Scapular Bone Region Severe depletion  Dorsal Hand Moderate depletion  Patellar Region Severe depletion  Anterior Thigh Region Severe depletion  Posterior Calf Region Severe depletion  Edema (RD Assessment) None  Hair Reviewed  Eyes Reviewed  Mouth Reviewed  Skin Reviewed  Nails Reviewed       Diet Order:   Diet Order             DIET SOFT Room service appropriate? Yes; Fluid consistency: Thin  Diet effective now                   EDUCATION NEEDS:   Education needs have been addressed  Skin:  Skin Assessment: Skin Integrity Issues: Skin Integrity Issues:: Incisions Incisions: abdomen  Last BM:  08/22/21 175 ml x 24 hours via ileostomy  Height:   Ht Readings from Last 1 Encounters:  08/10/2021 5\' 6"  (1.676 m)    Weight:   Wt Readings from Last 1 Encounters:  08/30/21 61.2 kg    BMI:  Body mass index is 21.78 kg/m.  Estimated Nutritional Needs:   Kcal:  1950-2150  Protein:  100-110g  Fluid:  2L/day  Clayton Bibles, MS, RD, LDN Inpatient Clinical Dietitian Contact information available via Amion

## 2021-09-01 NOTE — Progress Notes (Signed)
   09/01/21 1200  Mobility  Activity Ambulated in hall  Level of Assistance Standby assist, set-up cues, supervision of patient - no hands on  Assistive Device Front wheel walker  Distance Ambulated (ft) 170 ft  Mobility Ambulated with assistance in hallway  Mobility Response Tolerated well  Mobility performed by Mobility specialist  $Mobility charge 1 Mobility   Pt agreeable to mobilizing this morning. Ambulated about 1102ft in hall with RW, tolerated well. Pt sat in chair upon return to room, as he requested for he bed to be straightened out. Once finished pt stood and ostomy bag Velcro opened and contents spilled out on floor. Had pt clean up bag and sat him on EOB while the floor was being cleaned. Got pt new gown and socks. Left pt in bed with call bell at side. RN notified of session.   [facility] Specialist Acute Rehab Services Office: [phone]

## 2021-09-01 NOTE — Consult Note (Addendum)
[facility] [facility] Nurse ostomy follow up Stoma type/location: RLQ ileostomy Stomal assessment/size:  1 and 1/8 inch ostomy, red and viable, slightly prolapsed/long Peristomal assessment: intact Treatment options for stomal/peristomal skin: skin barrier ring Output: brown effluent. 100 cc brown liquid emptied from pouch Ostomy pouching: 2pc. 1 and 1/4 inch pouching system with skin barrier ring. Education provided:  Pt requested that he perform pouch change sitting in the side of the bed, assisted with getting out of bed and positioning patient so he can watch the process using a hand held mirror.   He was able to remove pouch, cleanse skin, cut wafer, apply barrier ring and wafer, and snap pouch to wafer without assistance. He is able to open and close velcro to empty. Reviewed pouching routines and ordering supplies.     Supplies at bedside: 5 skin barriers, 5 skin barrier rings, 5 pouches, educational booklet, pattern, stoma measuring guide. (Use barrier rings, Kellie Simmering # [id number], wafer Kellie Simmering # [id number], pouch Lawson # [id number])  Enrolled patient in [employer] Discharge program: Yes, previously   [facility] nursing team will continue to follow, and will remain available to this patient, the nursing and medical teams.   Thank-you,  Julien Girt MSN, [place], [place], [place], [place]

## 2021-09-02 ENCOUNTER — Other Ambulatory Visit (HOSPITAL_COMMUNITY): Payer: Self-pay

## 2021-09-02 DIAGNOSIS — E43 Unspecified severe protein-calorie malnutrition: Secondary | ICD-10-CM | POA: Insufficient documentation

## 2021-09-02 LAB — GLUCOSE, CAPILLARY: Glucose-Capillary: 101 mg/dL — ABNORMAL HIGH (ref 70–99)

## 2021-09-02 MED ORDER — OXYCODONE HCL 5 MG PO TABS
5.0000 mg | ORAL_TABLET | Freq: Four times a day (QID) | ORAL | Status: DC | PRN
  Administered 2021-09-02 – 2021-09-03 (×6): 10 mg via ORAL
  Filled 2021-09-02 (×7): qty 2

## 2021-09-02 MED ORDER — LOPERAMIDE HCL 2 MG PO CAPS
2.0000 mg | ORAL_CAPSULE | Freq: Two times a day (BID) | ORAL | Status: DC
  Administered 2021-09-02 (×2): 2 mg via ORAL
  Filled 2021-09-02 (×2): qty 1

## 2021-09-02 NOTE — Progress Notes (Signed)
PT Cancellation Note  Patient Details Name: Phillip Brooks MRN: [id number] DOB: May 15, 1956   Cancelled Treatment:    Reason Eval/Treat Not Completed: Pain limiting ability to participate;Fatigue/lethargy limiting ability to participate; patient relates already up and in hallway this am with OT.  Declined further mobility this pm due to fatigue and abdominal pain.  Will attempt again another day.   Reginia Naas 09/02/2021, 1:17 PM Magda Kiel, PT Acute Rehabilitation Services Pager:[phone] Office:[phone] 09/02/2021

## 2021-09-02 NOTE — Progress Notes (Signed)
Occupational Therapy Treatment Patient Details Name: Phillip Brooks MRN: [id number] DOB: 1956-03-14 Today's Date: 09/02/2021   History of present illness Patient is 65 y.o. male s/p robotic LAR, diverting ostomy on 08/03/2021 for advanced mid rectal cancer.   OT comments  Patient was able to participate in all tasks with reduced assistance with reported of 10/10 pain. Patient was noted to have jovial moments in hallway with singing during functional mobility. Patient required increased time to participate in all tasks secondary to reported pain. Patient's discharge plan remains appropriate at this time. OT will continue to follow acutely.     Recommendations for follow up therapy are one component of a multi-disciplinary discharge planning process, led by the attending physician.  Recommendations may be updated based on patient status, additional functional criteria and insurance authorization.    Follow Up Recommendations  Home health OT    Assistance Recommended at Discharge Intermittent Supervision/Assistance  Equipment Recommendations  None recommended by OT    Recommendations for Other Services      Precautions / Restrictions Precautions Precautions: Fall Precaution Comments: ileostomy, R JP Drain Restrictions Weight Bearing Restrictions: No       Mobility Bed Mobility Overal bed mobility: Modified Independent                  Transfers Overall transfer level: Needs assistance Equipment used: Rolling walker (2 wheels) Transfers: Sit to/from Stand Sit to Stand: Supervision           General transfer comment: patient was able to complete task himself with no assist from therapist.     Balance                                           ADL either performed or assessed with clinical judgement   ADL Overall ADL's : Needs assistance/impaired                       Lower Body Dressing Details (indicate cue type and reason): patietn  was able to don socks with set up sitting on edge of bed.             Functional mobility during ADLs: Min guard;Rolling walker (2 wheels) General ADL Comments: patient was noted to have singing and very positive chatting while in hallway for functional mobility with RW.    Extremity/Trunk Assessment              Vision Patient Visual Report: No change from baseline     Perception     Praxis      Cognition Arousal/Alertness: Awake/alert Behavior During Therapy: WFL for tasks assessed/performed Overall Cognitive Status: Within Functional Limits for tasks assessed                                            Exercises     Shoulder Instructions       General Comments      Pertinent Vitals/ Pain       Pain Assessment: Faces Faces Pain Scale: Hurts little more (patient reported pain 10/10) Pain Location: abdomen Pain Descriptors / Indicators: Grimacing;Tender Pain Intervention(s): Patient requesting pain meds-RN notified;Repositioned;Monitored during session  Home Living  Prior Functioning/Environment              Frequency  Min 2X/week        Progress Toward Goals  OT Goals(current goals can now be found in the care plan section)  Progress towards OT goals: Progressing toward goals     Plan Discharge plan remains appropriate;Frequency remains appropriate    Co-evaluation                 AM-PAC OT "6 Clicks" Daily Activity     Outcome Measure   Help from another person eating meals?: None Help from another person taking care of personal grooming?: A Little Help from another person toileting, which includes using toliet, bedpan, or urinal?: A Little Help from another person bathing (including washing, rinsing, drying)?: A Little Help from another person to put on and taking off regular upper body clothing?: None Help from another person to put on and taking off  regular lower body clothing?: A Little 6 Click Score: 20    End of Session Equipment Utilized During Treatment: Rolling walker (2 wheels)  OT Visit Diagnosis: Pain;Muscle weakness (generalized) (M62.81)   Activity Tolerance Patient tolerated treatment well   Patient Left in bed;with call bell/phone within reach;with bed alarm set   Nurse Communication Mobility status        Time: 5329-9242 OT Time Calculation (min): 45 min  Charges: OT General Charges $OT Visit: 1 Visit OT Treatments $Self Care/Home Management : 8-22 mins $Therapeutic Activity: 23-37 mins  Jackelyn Poling OTR/L, MS Acute Rehabilitation Department Office# [phone] Pager# [phone] 09/02/2021, 1:18 PM

## 2021-09-02 NOTE — Progress Notes (Signed)
18 Days Post-Op robotic LAR, diverting ostomy  Subjective/Chief Complaint:  less lower abd pain, tolerating some solid food, c/o bloating after meals Ambulating in hall, working with ostomy RN  Objective: Vital signs in last 24 hours: Temp:  [97.9 F (36.6 C)-98.3 F (36.8 C)] 98.3 F (36.8 C) (11/15 0652) Pulse Rate:  [88-94] 94 (11/15 0652) Resp:  [18] 18 (11/15 0652) BP: (106-115)/(70-76) 115/75 (11/15 0652) SpO2:  [99 %-100 %] 100 % (11/15 0652) Weight:  [58.6 kg] 58.6 kg (11/15 0500) Last BM Date: 09/01/21  Intake/Output from previous day: 11/14 0701 - 11/15 0700 In: 2242.2 [P.O.:1200; I.V.:1042.2] Out: 4257.5 [Urine:2550; Drains:7.5; YIRSW:5462] Intake/Output this shift: No intake/output data recorded.  General appearance: alert, cooperative, and no distress GI: distended, tender in lower abdomen; ostomy pink with increased liquid stool Incisions OK, JP drain with min clear drainage  Lab Results:  Recent Labs    09/01/21 0417  WBC 9.9  HGB 7.1*  HCT 23.1*  PLT 707*    BMET Recent Labs    09/01/21 0417  NA 133*  K 4.0  CL 100  CO2 26  GLUCOSE 107*  BUN 21  CREATININE 0.94  CALCIUM 9.3    PT/INR No results for input(s): LABPROT, INR in the last 72 hours. ABG No results for input(s): PHART, HCO3 in the last 72 hours.  Invalid input(s): PCO2, PO2  Studies/Results: No results found.  Anti-infectives: Anti-infectives (From admission, onward)    Start     Dose/Rate Route Frequency Ordered Stop   08/26/21 1000  metroNIDAZOLE (FLAGYL) IVPB 500 mg  Status:  Discontinued        500 mg 100 mL/hr over 60 Minutes Intravenous Every 12 hours 08/26/21 0830 08/26/21 0910   08/26/21 1000  piperacillin-tazobactam (ZOSYN) IVPB 3.375 g  Status:  Discontinued        3.375 g 12.5 mL/hr over 240 Minutes Intravenous Every 8 hours 08/26/21 0910 09/01/21 0819   08/25/21 1030  metroNIDAZOLE (FLAGYL) tablet 500 mg  Status:  Discontinued        500 mg Per Tube Every  12 hours 08/25/21 0935 08/26/21 0830   08/25/21 1000  cefTRIAXone (ROCEPHIN) 2 g in sodium chloride 0.9 % 100 mL IVPB  Status:  Discontinued       Note to Pharmacy: Pharmacy may adjust dosing strength / duration / interval for maximal efficacy   2 g 200 mL/hr over 30 Minutes Intravenous Every 24 hours 08/25/21 0823 08/26/21 0910   08/25/21 1000  metroNIDAZOLE (FLAGYL) tablet 500 mg  Status:  Discontinued        500 mg Oral Every 12 hours 08/25/21 0837 08/25/21 0935   07/23/2021 2000  cefoTEtan (CEFOTAN) 2 g in sodium chloride 0.9 % 100 mL IVPB        2 g 200 mL/hr over 30 Minutes Intravenous Every 12 hours 07/24/2021 1647 07/31/2021 2046   08/17/2021 0600  cefoTEtan (CEFOTAN) 2 g in sodium chloride 0.9 % 100 mL IVPB        2 g 200 mL/hr over 30 Minutes Intravenous On call to O.R. 07/23/2021 0513 07/24/2021 0806       Assessment/Plan: s/p Procedure(s): XI ROBOTIC ASSISTED LOWER ANTERIOR RESECTION, WITH BILATERAL TAP BLOCK AND INTRAOPERATIVE ASSESSMENT USING ICG (N/A) DIVERTING ILEOSTOMY (N/A) CYSTOSCOPY with FIREFLY INJECTION AND URETHERAL DILATION (N/A) Expected post op Ileus: resolving, advance to soft diet, cont to monitor ostomy output, NG out 11/7 Severe malnutrition, prolonged ileus: pt eating better, will stop TPn today Elevated WBC,  purulence in JP: CT concerning for possible anastomotic dehisscence vs abscess from previous perforated rectal cancer, JP drainage catheter appears to be in appropriate position, d/c Zosyn 11/14.  wbc trending down Ambulate in hall, appreciate PT/OT involvement  Cont PO pain meds -  Oxy condone prn Urinary retention: foley replaced on Mon 11/1.  foley out again 11/7, failed voiding trial 11/9 with elevated PVR.   Foley replaced, will need urology f/u as outpatient Dispo: pt has a temporary housing plan in place at discharge in [place] with his Aunt.  TOC team consulted and working with housing authority to assist with long term housing.      LOS: 18 days     Phillip Brooks 25/83/4621

## 2021-09-03 MED ORDER — ENSURE SURGERY PO LIQD
237.0000 mL | Freq: Four times a day (QID) | ORAL | Status: DC
  Administered 2021-09-03: 237 mL via ORAL

## 2021-09-03 MED ORDER — LOPERAMIDE HCL 2 MG PO CAPS
2.0000 mg | ORAL_CAPSULE | Freq: Three times a day (TID) | ORAL | Status: DC
  Administered 2021-09-03 (×3): 2 mg via ORAL
  Filled 2021-09-03 (×3): qty 1

## 2021-09-03 NOTE — Progress Notes (Signed)
Physical Therapy Treatment Patient Details Name: Phillip Brooks MRN: [id number] DOB: 14-May-1956 Today's Date: 09/03/2021   History of Present Illness Patient is 64 y.o. male s/p robotic LAR, diverting ostomy on 07/21/2021 for advanced mid rectal cancer.    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  "Not feeling like I was yesterday". Assisted OOB to amb required increased time.  General bed mobility comments: pt self able with increased time.  Pt also self able to empty his ostomy while seated EOB. General transfer comment: patient was able to complete task himself with no assist from therapist. General Gait Details: decreased amb distance due to increased c/o nausea this session.  HR increased to 125. "not feeling" good today. Assisted back to bed and reported to RN.    Recommendations for follow up therapy are one component of a multi-disciplinary discharge planning process, led by the attending physician.  Recommendations may be updated based on patient status, additional functional criteria and insurance authorization.  Follow Up Recommendations  Home health PT     Assistance Recommended at Discharge Intermittent Supervision/Assistance  Equipment Recommendations  Rolling walker (2 wheels)    Recommendations for Other Services       Precautions / Restrictions Precautions Precautions: Fall Precaution Comments: ileostomy, R JP Drain Restrictions Weight Bearing Restrictions: No     Mobility  Bed Mobility Overal bed mobility: Modified Independent             General bed mobility comments: pt self able with increased time.  Pt also self able to empty his ostomy while seated EOB.    Transfers Overall transfer level: Needs assistance Equipment used: Rolling walker (2 wheels) Transfers: Sit to/from  Stand Sit to Stand: Supervision           General transfer comment: patient was able to complete task himself with no assist from therapist.    Ambulation/Gait Ambulation/Gait assistance: Supervision Gait Distance (Feet): 84 Feet Assistive device: Rolling walker (2 wheels) Gait Pattern/deviations: Step-through pattern;Decreased stride length;Trunk flexed Gait velocity: decr     General Gait Details: decreased amb distance due to increased c/o nausea this session.  HR increased to 125. "not feeling" good today.   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  "Not feeling like I was yesterday".        Exercises      General Comments        Pertinent Vitals/Pain Pain Assessment: Faces Faces Pain Scale:  Hurts little more Pain Location: abdomen Pain Descriptors / Indicators: Grimacing;Tender Pain Intervention(s): Monitored during session;Repositioned    Home Living                          Prior Function            PT Goals (current goals can now be found in the care plan section) Progress towards PT goals: Progressing toward goals    Frequency    Min 3X/week      PT Plan Current plan remains appropriate    Co-evaluation              AM-PAC PT "6 Clicks" Mobility   Outcome Measure  Help needed turning from your back to your side while in a flat bed without using bedrails?: None Help needed moving from  lying on your back to sitting on the side of a flat bed without using bedrails?: None Help needed moving to and from a bed to a chair (including a wheelchair)?: A Little Help needed standing up from a chair using your arms (e.g., wheelchair or bedside chair)?: A Little Help needed to walk in hospital room?: A Little Help needed climbing 3-5 steps with a railing? : A Little 6 Click Score: 20    End of Session Equipment Utilized During Treatment: Gait belt Activity Tolerance: Other (comment) (nausea) Patient left: in bed;with call bell/phone within reach;with family/visitor present Nurse Communication: Mobility status PT Visit Diagnosis: Muscle weakness (generalized) (M62.81);Difficulty in walking, not elsewhere classified (R26.2);Unsteadiness on feet (R26.81);Pain     Time: 1420-1455 PT Time Calculation (min) (ACUTE ONLY): 35 min  Charges:  $Gait Training: 8-22 mins $Therapeutic Activity: 8-22 mins                     Rica Koyanagi  PTA Acute  Rehabilitation Services Pager      [phone] Office      [phone]

## 2021-09-03 NOTE — Progress Notes (Signed)
19 Days Post-Op robotic LAR, diverting ostomy  Subjective/Chief Complaint:  less lower abd pain, tolerating some solid food, Ambulating in hall per pt  Objective: Vital signs in last 24 hours: Temp:  [97.7 F (36.5 C)-98.3 F (36.8 C)] 97.7 F (36.5 C) (11/16 0631) Pulse Rate:  [90-99] 99 (11/16 0631) Resp:  [16-18] 18 (11/16 0631) BP: (110-131)/(71-84) 115/77 (11/16 0631) SpO2:  [98 %-100 %] 99 % (11/16 0631) Weight:  [58.5 kg] 58.5 kg (11/16 0631) Last BM Date: 09/02/21  Intake/Output from previous day: 11/15 0701 - 11/16 0700 In: 1951.4 [P.O.:1400; I.V.:551.4] Out: 4925 [Urine:2550; Stool:2375] Intake/Output this shift: Total I/O In: -  Out: 675 [Emesis/NG output:475; Stool:200]  General appearance: alert, cooperative, and no distress GI: distended, tender in lower abdomen; ostomy pink with increased liquid stool Incisions OK, JP drain with min clear drainage  Lab Results:  Recent Labs    09/01/21 0417  WBC 9.9  HGB 7.1*  HCT 23.1*  PLT 707*    BMET Recent Labs    09/01/21 0417  NA 133*  K 4.0  CL 100  CO2 26  GLUCOSE 107*  BUN 21  CREATININE 0.94  CALCIUM 9.3    PT/INR No results for input(s): LABPROT, INR in the last 72 hours. ABG No results for input(s): PHART, HCO3 in the last 72 hours.  Invalid input(s): PCO2, PO2  Studies/Results: No results found.  Anti-infectives: Anti-infectives (From admission, onward)    Start     Dose/Rate Route Frequency Ordered Stop   08/26/21 1000  metroNIDAZOLE (FLAGYL) IVPB 500 mg  Status:  Discontinued        500 mg 100 mL/hr over 60 Minutes Intravenous Every 12 hours 08/26/21 0830 08/26/21 0910   08/26/21 1000  piperacillin-tazobactam (ZOSYN) IVPB 3.375 g  Status:  Discontinued        3.375 g 12.5 mL/hr over 240 Minutes Intravenous Every 8 hours 08/26/21 0910 09/01/21 0819   08/25/21 1030  metroNIDAZOLE (FLAGYL) tablet 500 mg  Status:  Discontinued        500 mg Per Tube Every 12 hours 08/25/21 0935  08/26/21 0830   08/25/21 1000  cefTRIAXone (ROCEPHIN) 2 g in sodium chloride 0.9 % 100 mL IVPB  Status:  Discontinued       Note to Pharmacy: Pharmacy may adjust dosing strength / duration / interval for maximal efficacy   2 g 200 mL/hr over 30 Minutes Intravenous Every 24 hours 08/25/21 0823 08/26/21 0910   08/25/21 1000  metroNIDAZOLE (FLAGYL) tablet 500 mg  Status:  Discontinued        500 mg Oral Every 12 hours 08/25/21 0837 08/25/21 0935   08/18/2021 2000  cefoTEtan (CEFOTAN) 2 g in sodium chloride 0.9 % 100 mL IVPB        2 g 200 mL/hr over 30 Minutes Intravenous Every 12 hours 07/23/2021 1647 08/02/2021 2046   08/06/2021 0600  cefoTEtan (CEFOTAN) 2 g in sodium chloride 0.9 % 100 mL IVPB        2 g 200 mL/hr over 30 Minutes Intravenous On call to O.R. 08/04/2021 0513 08/05/2021 0806       Assessment/Plan: s/p Procedure(s): XI ROBOTIC ASSISTED LOWER ANTERIOR RESECTION, WITH BILATERAL TAP BLOCK AND INTRAOPERATIVE ASSESSMENT USING ICG (N/A) DIVERTING ILEOSTOMY (N/A) CYSTOSCOPY with FIREFLY INJECTION AND URETHERAL DILATION (N/A) Expected post op Ileus: resolving, advance to soft diet, cont to monitor ostomy output, NG out 11/7 Severe malnutrition, prolonged ileus: pt eating some off TPN, will have nutrition check calorie  count, I don't believe pt is taking in enough calories for discharge High output ileostomy: imodium 47m QID, fiber pills with meals.  Output currently too high for discharge Anemia: most likely related to chronic illness and malnutrition.  Iron supplements ordered Elevated WBC, purulence in JP: CT concerning for possible anastomotic dehisscence vs abscess from previous perforated rectal cancer, JP drainage catheter appears to be in appropriate position, d/c Zosyn 11/14.  wbc trending down.  Plan to d/c JP prior to d/c home Ambulate in hall, appreciate PT/OT involvement  Cont PO pain meds -  Oxy condone prn Urinary retention: foley replaced on Mon 11/1.  foley out again 11/7, failed  voiding trial 11/9 with elevated PVR.   Foley replaced, will need urology f/u as outpatient Dispo: pt has a temporary housing plan in place at discharge in [place] with his Aunt.  TOC team consulted and working with housing authority to assist with long term housing.      LOS: 19 days    Phillip Brooks 03/00/9233

## 2021-09-03 NOTE — Discharge Planning (Signed)
Oncology Discharge Planning Note  [facility] Address: [address] [place], [place], [place] [address] Hours of Operation:  Nena Polio, Monday - Friday  Clinic Contact Information:  [phone]  Oncology Care Team: Medical Oncologist:  Dr. Betsy Coder  Patient Details: Name:  Phillip Brooks, Phillip Brooks MRN:   [id number] DOB:   1956-05-02 Reason for Current Admission: <principal problem not specified>  Discharge Planning Narrative: Notification of admission received by Dr. Benay Spice for Phillip Brooks.  Discharge follow-up appointments for oncology will be scheduled upon discharge to be seen 3-4 weeks post discharge. Upon discharge from the hospital, hematology/oncology's post discharge plan of care for the outpatient setting is: Follow up in 3-4 weeks   Phillip Brooks will be called within two business days after discharge to review hematology/oncology's plan of care for full understanding.    Outpatient Oncology Specific Care Only: Oncology appointment transportation needs addressed?:  no Oncology medication management for symptom management addressed?:  no Chemo Alert Card reviewed?:  no Immunotherapy Alert Card reviewed?:  not applicable

## 2021-09-04 ENCOUNTER — Inpatient Hospital Stay (HOSPITAL_COMMUNITY): Payer: Medicare HMO

## 2021-09-04 DIAGNOSIS — R110 Nausea: Secondary | ICD-10-CM | POA: Diagnosis not present

## 2021-09-04 LAB — BASIC METABOLIC PANEL
Anion gap: 14 (ref 5–15)
BUN: 48 mg/dL — ABNORMAL HIGH (ref 8–23)
CO2: 23 mmol/L (ref 22–32)
Calcium: 10.7 mg/dL — ABNORMAL HIGH (ref 8.9–10.3)
Chloride: 92 mmol/L — ABNORMAL LOW (ref 98–111)
Creatinine, Ser: 1.79 mg/dL — ABNORMAL HIGH (ref 0.61–1.24)
GFR, Estimated: 41 mL/min — ABNORMAL LOW (ref >60)
Glucose, Bld: 109 mg/dL — ABNORMAL HIGH (ref 70–99)
Potassium: 4.5 mmol/L (ref 3.5–5.1)
Sodium: 129 mmol/L — ABNORMAL LOW (ref 135–145)

## 2021-09-04 LAB — MAGNESIUM: Magnesium: 2.4 mg/dL (ref 1.7–2.4)

## 2021-09-04 LAB — PHOSPHORUS: Phosphorus: 6.5 mg/dL — ABNORMAL HIGH (ref 2.5–4.6)

## 2021-09-04 MED ORDER — TRAVASOL 10 % IV SOLN
INTRAVENOUS | Status: AC
  Filled 2021-09-04: qty 1020

## 2021-09-04 MED ORDER — HYDROMORPHONE HCL 1 MG/ML IJ SOLN
0.5000 mg | INTRAMUSCULAR | Status: DC | PRN
  Administered 2021-09-04 – 2021-09-17 (×64): 0.5 mg via INTRAVENOUS
  Filled 2021-09-04 (×65): qty 0.5

## 2021-09-04 NOTE — Progress Notes (Signed)
20 Days Post-Op robotic LAR, diverting ostomy  Subjective/Chief Complaint: Tried to eat more yesterday but nausea worsened.    Objective: Vital signs in last 24 hours: Temp:  [97.5 F (36.4 C)-98.5 F (36.9 C)] 97.8 F (36.6 C) (11/17 0411) Pulse Rate:  [99-125] 107 (11/17 0411) Resp:  [16-20] 20 (11/17 0411) BP: (103-123)/(74-83) 116/74 (11/17 0411) SpO2:  [97 %-100 %] 100 % (11/17 0411) Weight:  [53.6 kg] 53.6 kg (11/17 0459) Last BM Date: 09/03/21  Intake/Output from previous day: 11/16 0701 - 11/17 0700 In: 660 [P.O.:660] Out: 4910 [Urine:1800; Emesis/NG output:1275; Drains:35; PRXYV:8592] Intake/Output this shift: No intake/output data recorded.  General appearance: alert, cooperative, and no distress GI: distended, tender in lower abdomen; ostomy pink with increased liquid stool Incisions OK, JP drain with min clear drainage  Lab Results:  No results for input(s): WBC, HGB, HCT, PLT in the last 72 hours.  BMET Recent Labs    09/04/21 0734  NA 129*  K 4.5  CL 92*  CO2 23  GLUCOSE 109*  BUN 48*  CREATININE 1.79*  CALCIUM 10.7*    PT/INR No results for input(s): LABPROT, INR in the last 72 hours. ABG No results for input(s): PHART, HCO3 in the last 72 hours.  Invalid input(s): PCO2, PO2  Studies/Results: DG Abd Portable 2V  Result Date: 09/04/2021 CLINICAL DATA:  Nausea. EXAM: PORTABLE ABDOMEN - 2 VIEW COMPARISON:  August 20, 2021. FINDINGS: Dilated small bowel loops are noted concerning for distal small bowel obstruction. Surgical drain is noted in the pelvis. There is no evidence of free air. No radio-opaque calculi or other significant radiographic abnormality is seen. IMPRESSION: Dilated small bowel loops are noted concerning for distal small bowel obstruction. Electronically Signed   By: Marijo Conception M.D.   On: 09/04/2021 08:15    Anti-infectives: Anti-infectives (From admission, onward)    Start     Dose/Rate Route Frequency Ordered Stop    08/26/21 1000  metroNIDAZOLE (FLAGYL) IVPB 500 mg  Status:  Discontinued        500 mg 100 mL/hr over 60 Minutes Intravenous Every 12 hours 08/26/21 0830 08/26/21 0910   08/26/21 1000  piperacillin-tazobactam (ZOSYN) IVPB 3.375 g  Status:  Discontinued        3.375 g 12.5 mL/hr over 240 Minutes Intravenous Every 8 hours 08/26/21 0910 09/01/21 0819   08/25/21 1030  metroNIDAZOLE (FLAGYL) tablet 500 mg  Status:  Discontinued        500 mg Per Tube Every 12 hours 08/25/21 0935 08/26/21 0830   08/25/21 1000  cefTRIAXone (ROCEPHIN) 2 g in sodium chloride 0.9 % 100 mL IVPB  Status:  Discontinued       Note to Pharmacy: Pharmacy may adjust dosing strength / duration / interval for maximal efficacy   2 g 200 mL/hr over 30 Minutes Intravenous Every 24 hours 08/25/21 0823 08/26/21 0910   08/25/21 1000  metroNIDAZOLE (FLAGYL) tablet 500 mg  Status:  Discontinued        500 mg Oral Every 12 hours 08/25/21 0837 08/25/21 0935   08/09/2021 2000  cefoTEtan (CEFOTAN) 2 g in sodium chloride 0.9 % 100 mL IVPB        2 g 200 mL/hr over 30 Minutes Intravenous Every 12 hours 08/03/2021 1647 08/11/2021 2046   07/21/2021 0600  cefoTEtan (CEFOTAN) 2 g in sodium chloride 0.9 % 100 mL IVPB        2 g 200 mL/hr over 30 Minutes Intravenous On call to O.R.  08/14/2021 0513 08/02/2021 0806       Assessment/Plan: s/p Procedure(s): XI ROBOTIC ASSISTED LOWER ANTERIOR RESECTION, WITH BILATERAL TAP BLOCK AND INTRAOPERATIVE ASSESSMENT USING ICG (N/A) DIVERTING ILEOSTOMY (N/A) CYSTOSCOPY with FIREFLY INJECTION AND URETHERAL DILATION (N/A) Expected post op Ileus: resolving, advance to soft diet, cont to monitor ostomy output, NG out 11/7, AXR with continued ileus on 11/17.  NPO, NGT PRN Severe malnutrition, prolonged ileus: pt not eating well off TPN, Restart TPN High output ileostomy: will cont fiber pills but stop imodium due to vomiting. Anemia: most likely related to chronic illness and malnutrition.  Iron supplements ordered but  pt not tolerating this.  Will recheck CBC and iron levels tom am Elevated WBC, purulence in JP: CT concerning for possible anastomotic dehisscence vs abscess from previous perforated rectal cancer, JP drainage catheter appears to be in appropriate position, d/c Zosyn 11/14.  wbc trending down.  Plan to d/c JP prior to d/c home Ambulate in hall, appreciate PT/OT involvement  Urinary retention: foley replaced on Mon 11/1.  foley out again 11/7, failed voiding trial 11/9 with elevated PVR.   Foley replaced, will need urology f/u as outpatient Dispo: pt has a temporary housing plan in place at discharge in [place] with his Aunt.  TOC team consulted and working with housing authority to assist with long term housing.      LOS: 20 days    Rosario Adie 31/28/1188

## 2021-09-04 NOTE — Progress Notes (Signed)
Nutrition Follow-up  DOCUMENTATION CODES:  Severe malnutrition in context of chronic illness  INTERVENTION:  Restart TPN per Pharmacy.  Recommend continuing TPN until patient is tolerating >50% of most meals.  NUTRITION DIAGNOSIS:  Severe Malnutrition related to chronic illness, cancer and cancer related treatments as evidenced by severe fat depletion, severe muscle depletion. - ongoing  GOAL:  Patient will meet greater than or equal to 90% of their needs - not meeting PO, to be met with TPN  MONITOR:  Diet advancement, Labs, Weight trends, Skin, I & O's, Other (Comment) (TPN)  REASON FOR ASSESSMENT:  Consult New TPN/TNA, Diet education  ASSESSMENT:  65 year old male who presented on 10/28 for surgery. PMH of stage III rectal cancer s/p chemotherapy and radiation. 10/28 - s/p cystoscopy with balloon dilation of bulbar urethral stricture and bilateral ureteral catheterization, lower anterior colon resection with diverting ileostomy; clear liquids 10/29 - NPO 10/30 - full liquids 10/31 - NPO 11/2 - NGT placed with significant output 11/4 - CT abdomen showing fluid collection, SBO, bladder thickening 11/5 - TPN started 11/7 - NGT removed 11/9 - CLD 11/10 - FLD 11/11 - Soft diet  11/15 - TPN stopped  Per RN, pt with two episodes of emesis during night shift. Also unable to tolerate any PO medication. Plan was for patient to discharge within the next day or so before this occurred. Pt now NPO again.  This morning, RD received new consult for TPN from Pharmacy.  Attempted to visit pt at bedside. Pt sound asleep and due to emesis episodes, left pt undisturbed.  Pt only eating bites and sips at most meals (0-25%), per Epic.  Admit wt: 59.7 kg Current wt: 53.6 kg  RD in agreement to restart TPN given severe malnutrition and intolerance to PO intake.  TOC team consulted and working with housing authority to assist with long term housing.   Supplements: [place] BID,  Ensure Surgery QID  Medications: reviewed; ferrous sulfate TID, MVI with minerals, Fibercon QID, Zofran PRN per IV (given once today)  Labs: reviewed; Na 129 (L), Glucose 109 (H), BUN 48 (H - trending up), Crt 1.79 (H - trending up), Phos 6.5 (H)  NUTRITION - FOCUSED PHYSICAL EXAM: Flowsheet Row Most Recent Value  Orbital Region Severe depletion  Upper Arm Region Severe depletion  Thoracic and Lumbar Region Unable to assess  Buccal Region Severe depletion  Temple Region Severe depletion  Clavicle Bone Region Severe depletion  Clavicle and Acromion Bone Region Severe depletion  Scapular Bone Region Severe depletion  Dorsal Hand Moderate depletion  Patellar Region Severe depletion  Anterior Thigh Region Severe depletion  Posterior Calf Region Severe depletion  Edema (RD Assessment) None  Hair Reviewed  Eyes Reviewed  Mouth Reviewed  Skin Reviewed  Nails Reviewed   Diet Order:   Diet Order             Diet NPO time specified Except for: Sips with Meds  Diet effective now                  EDUCATION NEEDS:  Education needs have been addressed  Skin:  Skin Assessment: Skin Integrity Issues: Skin Integrity Issues:: Incisions Incisions: abdomen  Last BM:  09/03/21 - ileostomy  Height:  Ht Readings from Last 1 Encounters:  08/06/2021 _0  (1.676 m)   Weight:  Wt Readings from Last 1 Encounters:  09/04/21 53.6 kg   BMI:  Body mass index is 19.07 kg/m.  Estimated Nutritional Needs:  Kcal:  0354-6568 Protein:  100-110g Fluid:  2L/day  Derrel Nip, RD, LDN (she/her/hers) Clinical Inpatient Dietitian RD Pager/After-Hours/Weekend Pager # in [facility]

## 2021-09-04 NOTE — Consult Note (Addendum)
[place] Nurse ostomy follow up Stoma type/location: RLQ ileostomy Stomal assessment/size:  1 and 1/8 inch ostomy, red and viable, slightly prolapsed/long Peristomal assessment: intact Treatment options for stomal/peristomal skin: skin barrier ring Output: brown effluent. 100 cc brown liquid emptied from pouch Ostomy pouching: 2pc. 1 and 1/4 inch pouching system with skin barrier ring. Education provided:  Pt has been independent with pouch application and emptying during previous teaching sessions.  Today he has been vomiting and is very nauseated and requests that I perform the pouch change since he feels so poorly. Pt supplies at bedside: 5 skin barriers, 5 skin barrier rings, 5 pouches, educational booklet, pattern, stoma measuring guide. (Use barrier rings, Kellie Simmering # [phone], wafer Kellie Simmering # [phone], pouch Lawson # [id number])  Enrolled patient in [employer] Discharge program: Yes, previously   [place] nursing team will continue to follow, and will remain available to this patient, the nursing and medical teams.   Thank-you,  Julien Girt MSN, [place], [place], [place], [place]  Jerrilyn [place]

## 2021-09-04 NOTE — Progress Notes (Signed)
Patient has thrown up twice during shift. Unable to tolerate po medication at this time. He states that he feels better after throwing up. Vital signs are stable. Will continue to monitor.

## 2021-09-04 NOTE — Progress Notes (Addendum)
PHARMACY - TOTAL PARENTERAL NUTRITION CONSULT NOTE   Indication: intolerance to enteral feeding  Patient Measurements: Height: '5\' 6"'  (167.6 cm) Weight: 53.6 kg (118 lb 2.7 oz) IBW/kg (Calculated) : 63.8 TPN AdjBW (KG): 59.7 Body mass index is 19.07 kg/m.  Assessment:  65 yo M presents on 10/28 for eval of rectal cancer. S/P LAR and diverting ileostomy. Has been NPO for 7 days and NG tube in place with high output. Pharmacy consulted to start TPN.   11/13: much better PO intake; start weaning TPN per Surgery 11/14 CCS "cont TPN until eating well" 11/15 DC TPN after bag completed 11/17 resume TPN. Pt vomited x 2 on nights, unable to tolerate PO meds  Glucose / Insulin: No Hx DM - SSI stopped 11/11; CBGs well controlled previously on full rate TPN Electrolytes:  Na 129 low;  Phos 6.5 - high; CoCa 11.82 elevated; Mg 2.4; K 4.5 Renal: SCr up to 1.72, BUN up to 48, CL low  Hepatic: Albumin remains low but stable; ALT & Alk phos slightly elevated, TG stable WNL  Intake / Output; MIVF:  - No MIVF - NGT removed; drain OP negligible; stool 1125/24 hrs. UOP 940 ml/h24 hr GI Imaging: 11/4 CT abdomen shows fluid collection, SBO, bladder thickening  11/17 KUB: Dilated small bowel loops are noted concerning for distal small bowel obstruction GI Surgeries / Procedures:  10/28: Diverting ileostomy   Central access: Implanted Port TPN start date: 11/5 >> 11/15. Resumed 11/17  Nutritional Goals:  Goal TPN rate is 85 mL/hr (provides 102 g of protein and 1958 kcals per day)  RD Assessment: Estimated Needs Total Energy Estimated Needs: 1950-2150 Total Protein Estimated Needs: 100-110g Total Fluid Estimated Needs: 2L/day  Current Nutrition:  NPO TPN  Plan:  resume TPN @ 85 mL/hr at 1800 with no electrolytes except Na Electrolytes in TPN:  Max Na 150 Meq/L K 0 mEq/L Ca 0 mEq/L Mg 0 mEq/L Phos 0  mmol/L.  Cl:Ac 1:2 No SSI  Add standard MVI and trace elements to TPN MIVF  none Monitor TPN labs on Mon/Thurs, CMET, Mg & Phos in AM  Eudelia Bunch, Pharm.D 09/04/2021 7:23 AM

## 2021-09-04 NOTE — Progress Notes (Signed)
Occupational Therapy Treatment Patient Details Name: Phillip Brooks MRN: [id number] DOB: 06-03-1956 Today's Date: 09/04/2021   History of present illness Patient is 65 y.o. male s/p robotic LAR, diverting ostomy on 07/28/2021 for advanced mid rectal cancer.   OT comments  Patient presents supine in bed and reports being tired and feeling worse due to abdominal pain and vomiting. Patient required encouragement to participate but does not want to perform ADLs just mobility. Patient has ileostomy and catheter which he is expected to go home with. Patient is modified independent with bed mobility and demonstrates strength to pull himself up in bed and lift his body fully off the bed into a pseudo plank position. He requires significant amount of time to perform bed transfer however and ambulation and verbal cues to stay on task. He did not need any physical assistance to ambulate short distance in room with RW and also demonstrates ability to stand on one leg with the walker repeatedly. He is limited by poor activity tolerance secondary to prolonged ileus and lack of nutrition. Overall patient has good physical abilities but his participation in ADL tasks and overall activity tolerance have been limited. Will downgrade POC to 1 x a week.   Recommendations for follow up therapy are one component of a multi-disciplinary discharge planning process, led by the attending physician.  Recommendations may be updated based on patient status, additional functional criteria and insurance authorization.    Follow Up Recommendations  Home health OT    Assistance Recommended at Discharge Intermittent Supervision/Assistance  Equipment Recommendations  None recommended by OT    Recommendations for Other Services      Precautions / Restrictions Precautions Precautions: Fall Precaution Comments: ileostomy, R JP Drain, catheter Restrictions Weight Bearing Restrictions: No       Mobility Bed Mobility Overal  bed mobility: Modified Independent             General bed mobility comments: Mod I for bed mobility. Patient can use upper extremity strength and pull himself up in bed or lift his entire body up.    Transfers Overall transfer level: Needs assistance Equipment used: Rolling walker (2 wheels) Transfers: Sit to/from Stand Sit to Stand: Supervision           General transfer comment: min guard to ambulate around bed and then back to left side with RW - no loss of balance. patient able to stand on one foot using walker multiple times. No overt loss of balance.     Balance Overall balance assessment: Mild deficits observed, not formally tested                                         ADL either performed or assessed with clinical judgement   ADL                                         General ADL Comments: Patient has a ileostomy and catheter (expected to go home with catheter)    Extremity/Trunk Assessment Upper Extremity Assessment Upper Extremity Assessment: Overall WFL for tasks assessed   Lower Extremity Assessment Lower Extremity Assessment: Defer to PT evaluation   Cervical / Trunk Assessment Cervical / Trunk Assessment: Kyphotic    Vision Patient Visual Report: No change from baseline  Perception     Praxis      Cognition Arousal/Alertness: Awake/alert Behavior During Therapy: WFL for tasks assessed/performed Overall Cognitive Status: Within Functional Limits for tasks assessed                                            Exercises     Shoulder Instructions       General Comments      Pertinent Vitals/ Pain       Pain Assessment: Faces Faces Pain Scale: Hurts even more Pain Location: abdomen Pain Descriptors / Indicators: Sore;Grimacing Pain Intervention(s): Monitored during session  Home Living                                          Prior Functioning/Environment               Frequency  Min 1X/week        Progress Toward Goals  OT Goals(current goals can now be found in the care plan section)  Progress towards OT goals: Progressing toward goals  Acute Rehab OT Goals Patient Stated Goal: to get out of here OT Goal Formulation: With patient Time For Goal Achievement: 09/03/21 Potential to Achieve Goals: Good  Plan Discharge plan remains appropriate;All goals met and education completed, patient discharged from OT services    Co-evaluation                 AM-PAC OT "6 Clicks" Daily Activity     Outcome Measure   Help from another person eating meals?: None Help from another person taking care of personal grooming?: None Help from another person toileting, which includes using toliet, bedpan, or urinal?: Total (catherter and ileostomy) Help from another person bathing (including washing, rinsing, drying)?: A Little Help from another person to put on and taking off regular upper body clothing?: None Help from another person to put on and taking off regular lower body clothing?: A Lot 6 Click Score: 18    End of Session Equipment Utilized During Treatment: Rolling walker (2 wheels)  OT Visit Diagnosis: Pain;Muscle weakness (generalized) (M62.81)   Activity Tolerance Patient tolerated treatment well   Patient Left in bed;with call bell/phone within reach   Nurse Communication Mobility status        Time: 7026-3785 OT Time Calculation (min): 34 min  Charges: OT General Charges $OT Visit: 1 Visit OT Treatments $Therapeutic Activity: 23-37 mins  Xinyi Batton, OTR/L [place]  Office [phone] Pager: [place] 09/04/2021, 4:46 PM

## 2021-09-05 LAB — MAGNESIUM: Magnesium: 2.2 mg/dL (ref 1.7–2.4)

## 2021-09-05 LAB — IRON AND TIBC
Iron: 34 ug/dL — ABNORMAL LOW (ref 45–182)
Saturation Ratios: 11 % — ABNORMAL LOW (ref 17.9–39.5)
TIBC: 317 ug/dL (ref 250–450)
UIBC: 283 ug/dL

## 2021-09-05 LAB — CBC
HCT: 25 % — ABNORMAL LOW (ref 39.0–52.0)
Hemoglobin: 8 g/dL — ABNORMAL LOW (ref 13.0–17.0)
MCH: 27.6 pg (ref 26.0–34.0)
MCHC: 32 g/dL (ref 30.0–36.0)
MCV: 86.2 fL (ref 80.0–100.0)
Platelets: 718 10*3/uL — ABNORMAL HIGH (ref 150–400)
RBC: 2.9 MIL/uL — ABNORMAL LOW (ref 4.22–5.81)
RDW: 14.6 % (ref 11.5–15.5)
WBC: 7.2 10*3/uL (ref 4.0–10.5)
nRBC: 0.3 % — ABNORMAL HIGH (ref 0.0–0.2)

## 2021-09-05 LAB — COMPREHENSIVE METABOLIC PANEL
ALT: 19 U/L (ref 0–44)
AST: 15 U/L (ref 15–41)
Albumin: 2.7 g/dL — ABNORMAL LOW (ref 3.5–5.0)
Alkaline Phosphatase: 79 U/L (ref 38–126)
Anion gap: 10 (ref 5–15)
BUN: 51 mg/dL — ABNORMAL HIGH (ref 8–23)
CO2: 28 mmol/L (ref 22–32)
Calcium: 9.2 mg/dL (ref 8.9–10.3)
Chloride: 94 mmol/L — ABNORMAL LOW (ref 98–111)
Creatinine, Ser: 1.36 mg/dL — ABNORMAL HIGH (ref 0.61–1.24)
GFR, Estimated: 57 mL/min — ABNORMAL LOW (ref >60)
Glucose, Bld: 141 mg/dL — ABNORMAL HIGH (ref 70–99)
Potassium: 3.4 mmol/L — ABNORMAL LOW (ref 3.5–5.1)
Sodium: 132 mmol/L — ABNORMAL LOW (ref 135–145)
Total Bilirubin: 0.1 mg/dL — ABNORMAL LOW (ref 0.3–1.2)
Total Protein: 8.3 g/dL — ABNORMAL HIGH (ref 6.5–8.1)

## 2021-09-05 LAB — TRIGLYCERIDES: Triglycerides: 96 mg/dL (ref <150)

## 2021-09-05 LAB — PHOSPHORUS: Phosphorus: 2.9 mg/dL (ref 2.5–4.6)

## 2021-09-05 MED ORDER — TRAVASOL 10 % IV SOLN
INTRAVENOUS | Status: AC
  Filled 2021-09-05: qty 1020

## 2021-09-05 MED ORDER — POTASSIUM CHLORIDE 10 MEQ/50ML IV SOLN
10.0000 meq | INTRAVENOUS | Status: AC
  Administered 2021-09-05 (×4): 10 meq via INTRAVENOUS
  Filled 2021-09-05 (×4): qty 50

## 2021-09-05 MED ORDER — SODIUM CHLORIDE 0.9 % IV SOLN: INTRAVENOUS | Status: DC

## 2021-09-05 MED ORDER — SODIUM CHLORIDE 0.9 % IV BOLUS
1000.0000 mL | Freq: Once | INTRAVENOUS | Status: AC
  Administered 2021-09-05: 1000 mL via INTRAVENOUS

## 2021-09-05 NOTE — Care Management Important Message (Signed)
Important Message  Patient Details IM Letter given to the Patient. Name: Phillip Brooks MRN: [id number] Date of Birth: 13-Jan-1956   Medicare Important Message Given:  Yes     Kerin Salen 09/05/2021, 10:51 AM

## 2021-09-05 NOTE — Progress Notes (Signed)
PHARMACY - TOTAL PARENTERAL NUTRITION CONSULT NOTE   Indication: intolerance to enteral feeding  Patient Measurements: Height: 5\' 6"  (167.6 cm) Weight: 51.2 kg (112 lb 14 oz) IBW/kg (Calculated) : 63.8 TPN AdjBW (KG): 59.7 Body mass index is 18.22 kg/m.  Assessment:  65 yo M presents on 10/28 for eval of rectal cancer. S/P LAR and diverting ileostomy. Has been NPO for 7 days and NG tube in place with high output. Pharmacy consulted to start TPN.   11/13: much better PO intake; start weaning TPN per Surgery 11/14 CCS "cont TPN until eating well" 11/15 DC TPN after bag completed 11/17 resume TPN. Pt vomited x 2 on nights, unable to tolerate PO meds  Glucose / Insulin: No Hx DM - SSI stopped 11/11; CBGs well controlled previously on full rate TPN; serum gluc < 150 Electrolytes:  Na 129 > 132 w/ max Na in TPN;  Phos 6.5 > 2.9 (none in TPN)  CoCa 10.24 (none in TPN) Mg 2.2 (none in TPN) ; K 3.4 (none in TPN)  Renal: SCr 1.72> 1.36, BUN 48> 51, CL low  Hepatic: Albumin remains low ; LFTs WNL, TG stable WNL  Intake / Output; MIVF:  11/18 1 L NS bolus given, NS 75 ml/hr per CCS  No NGT,  stool 175/24 hrs. UOP 1500 ml/h24 hr GI Imaging: 11/4 CT abdomen shows fluid collection, SBO, bladder thickening  11/17 KUB: Dilated small bowel loops are noted concerning for distal small bowel obstruction GI Surgeries / Procedures:  10/28: Diverting ileostomy   Central access: Implanted Port TPN start date: 11/5 >> 11/15. Resumed 11/17>>  Nutritional Goals:  Goal TPN rate is 85 mL/hr (provides 102 g of protein and 1958 kcals per day)  RD Assessment: Estimated Needs Total Energy Estimated Needs: 1950-2150 Total Protein Estimated Needs: 100-110g Total Fluid Estimated Needs: 2L/day  Current Nutrition:  NPO TPN  Plan:  Now: 4 runs K continue TPN @ 85 mL/hr Electrolytes in TPN:  Max Na 150 Meq/L K incr 25 mEq/L Ca incr 2 mEq/L Mg incr 3 mEq/L Phos incr 10  mmol/L.  Cl:Ac 1:2 No SSI   Add standard MVI and trace elements to TPN MIVF : 1 L NS bolus followed by NS @ 75 ml/hr per CCS Monitor TPN labs on Mon/Thurs, BMET, Mg & Phos in AM  Eudelia Bunch, Pharm.D 09/05/2021 8:51 AM

## 2021-09-05 NOTE — Progress Notes (Signed)
Physical Therapy Treatment Patient Details Name: Phillip Brooks MRN: [id number] DOB: 28-Feb-1956 Today's Date: 09/05/2021   History of Present Illness Patient is 65 y.o. male s/p robotic LAR, diverting ostomy on 08/11/2021 for advanced mid rectal cancer.    PT Comments    POD # 21 Pt progressing well with his mobility.  Did have increased nausea so now back to NPO.   Assisted OOB to amb a great distance in hallway.   Recommendations for follow up therapy are one component of a multi-disciplinary discharge planning process, led by the attending physician.  Recommendations may be updated based on patient status, additional functional criteria and insurance authorization.  Follow Up Recommendations  Home health PT     Assistance Recommended at Discharge Intermittent Supervision/Assistance  Equipment Recommendations  Rolling walker (2 wheels)    Recommendations for Other Services       Precautions / Restrictions Precautions Precautions: Fall Precaution Comments: ileostomy, R JP Drain, catheter     Mobility  Bed Mobility Overal bed mobility: Modified Independent             General bed mobility comments: Mod I for bed mobility with use of rail and increased time.    Transfers Overall transfer level: Needs assistance Equipment used: Rolling walker (2 wheels) Transfers: Sit to/from Stand Sit to Stand: Supervision           General transfer comment: good safety cognition and use of hands to steady self    Ambulation/Gait Ambulation/Gait assistance: Supervision Gait Distance (Feet): 175 Feet Assistive device: Rolling walker (2 wheels) Gait Pattern/deviations: Step-through pattern;Decreased stride length;Trunk flexed Gait velocity: decreased     General Gait Details: decreased amb distance due to increased c/o nausea this session.  HR increased to 125. "not feeling" good today.   Stairs             Wheelchair Mobility    Modified Rankin (Stroke  Patients Only)       Balance                                            Cognition Arousal/Alertness: Awake/alert Behavior During Therapy: WFL for tasks assessed/performed Overall Cognitive Status: Within Functional Limits for tasks assessed                                 General Comments: AxO x 3 very motivated and progressing well with his mobility. 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        Exercises      General Comments        Pertinent Vitals/Pain Pain Assessment: Faces Faces Pain Scale: Hurts a little bit Pain Location: abd Pain Descriptors / Indicators: Aching    Home Living                          Prior Function            PT Goals (current goals can now be found in the care plan section) Progress towards PT goals: Progressing toward goals    Frequency    Min 3X/week      PT Plan      Co-evaluation  AM-PAC PT "6 Clicks" Mobility   Outcome Measure  Help needed turning from your back to your side while in a flat bed without using bedrails?: None Help needed moving from lying on your back to sitting on the side of a flat bed without using bedrails?: None Help needed moving to and from a bed to a chair (including a wheelchair)?: A Little Help needed standing up from a chair using your arms (e.g., wheelchair or bedside chair)?: A Little Help needed to walk in hospital room?: A Little Help needed climbing 3-5 steps with a railing? : A Little 6 Click Score: 20    End of Session Equipment Utilized During Treatment: Gait belt Activity Tolerance: Patient tolerated treatment well Patient left: in bed;with call bell/phone within reach;with family/visitor present Nurse Communication: Mobility status PT Visit Diagnosis: Muscle weakness (generalized) (M62.81);Difficulty in walking, not elsewhere classified (R26.2);Unsteadiness on feet (R26.81);Pain      Time: 5102-5852 PT Time Calculation (min) (ACUTE ONLY): 38 min  Charges:  $Gait Training: 23-37 mins $Therapeutic Activity: 8-22 mins                     {Loneta Tamplin  PTA Acute  Rehabilitation Services Pager      [phone] Office      [phone]

## 2021-09-05 NOTE — Progress Notes (Signed)
21 Days Post-Op robotic LAR, diverting ostomy  Subjective/Chief Complaint: Feels better after being NPO  Objective: Vital signs in last 24 hours: Temp:  [97.5 F (36.4 C)-98.3 F (36.8 C)] 98.3 F (36.8 C) (11/18 0602) Pulse Rate:  [89-103] 89 (11/18 0602) Resp:  [16-18] 16 (11/18 0602) BP: (110-123)/(72-84) 123/72 (11/18 0602) SpO2:  [98 %-99 %] 99 % (11/18 0602) Weight:  [51.2 kg] 51.2 kg (11/18 0602) Last BM Date: 09/04/21  Intake/Output from previous day: 11/17 0701 - 11/18 0700 In: 60 [P.O.:60] Out: 1675 [Urine:1500; Stool:175] Intake/Output this shift: No intake/output data recorded.  General appearance: alert, cooperative, and no distress GI: distended, tender in lower abdomen; ostomy pink with increased liquid stool Incisions OK, JP drain with min clear drainage  Lab Results:  No results for input(s): WBC, HGB, HCT, PLT in the last 72 hours.  BMET Recent Labs    09/04/21 0734  NA 129*  K 4.5  CL 92*  CO2 23  GLUCOSE 109*  BUN 48*  CREATININE 1.79*  CALCIUM 10.7*    PT/INR No results for input(s): LABPROT, INR in the last 72 hours. ABG No results for input(s): PHART, HCO3 in the last 72 hours.  Invalid input(s): PCO2, PO2  Studies/Results: DG Abd Portable 2V  Result Date: 09/04/2021 CLINICAL DATA:  Nausea. EXAM: PORTABLE ABDOMEN - 2 VIEW COMPARISON:  August 20, 2021. FINDINGS: Dilated small bowel loops are noted concerning for distal small bowel obstruction. Surgical drain is noted in the pelvis. There is no evidence of free air. No radio-opaque calculi or other significant radiographic abnormality is seen. IMPRESSION: Dilated small bowel loops are noted concerning for distal small bowel obstruction. Electronically Signed   By: Marijo Conception M.D.   On: 09/04/2021 08:15    Anti-infectives: Anti-infectives (From admission, onward)    Start     Dose/Rate Route Frequency Ordered Stop   08/26/21 1000  metroNIDAZOLE (FLAGYL) IVPB 500 mg  Status:   Discontinued        500 mg 100 mL/hr over 60 Minutes Intravenous Every 12 hours 08/26/21 0830 08/26/21 0910   08/26/21 1000  piperacillin-tazobactam (ZOSYN) IVPB 3.375 g  Status:  Discontinued        3.375 g 12.5 mL/hr over 240 Minutes Intravenous Every 8 hours 08/26/21 0910 09/01/21 0819   08/25/21 1030  metroNIDAZOLE (FLAGYL) tablet 500 mg  Status:  Discontinued        500 mg Per Tube Every 12 hours 08/25/21 0935 08/26/21 0830   08/25/21 1000  cefTRIAXone (ROCEPHIN) 2 g in sodium chloride 0.9 % 100 mL IVPB  Status:  Discontinued       Note to Pharmacy: Pharmacy may adjust dosing strength / duration / interval for maximal efficacy   2 g 200 mL/hr over 30 Minutes Intravenous Every 24 hours 08/25/21 0823 08/26/21 0910   08/25/21 1000  metroNIDAZOLE (FLAGYL) tablet 500 mg  Status:  Discontinued        500 mg Oral Every 12 hours 08/25/21 0837 08/25/21 0935   07/19/2021 2000  cefoTEtan (CEFOTAN) 2 g in sodium chloride 0.9 % 100 mL IVPB        2 g 200 mL/hr over 30 Minutes Intravenous Every 12 hours 07/23/2021 1647 08/11/2021 2046   08/03/2021 0600  cefoTEtan (CEFOTAN) 2 g in sodium chloride 0.9 % 100 mL IVPB        2 g 200 mL/hr over 30 Minutes Intravenous On call to O.R. 07/23/2021 5093 07/20/2021 2671  Assessment/Plan: s/p Procedure(s): XI ROBOTIC ASSISTED LOWER ANTERIOR RESECTION, WITH BILATERAL TAP BLOCK AND INTRAOPERATIVE ASSESSMENT USING ICG (N/A) DIVERTING ILEOSTOMY (N/A) CYSTOSCOPY with FIREFLY INJECTION AND URETHERAL DILATION (N/A) Expected post op Ileus: resolving, advance to soft diet, cont to monitor ostomy output, NG out 11/7, AXR with continued ileus on 11/17.  NPO, NGT PRN, will repeat CT once Cr better to evaluate further Severe malnutrition, prolonged ileus: pt not eating well off TPN, Restarted TPN 11/17 High output ileostomy: will cont fiber pills but hold imodium due to vomiting. Pt appears dehydrated today.  Will bolus fluids and recheck labs in AM Anemia: most likely  related to chronic illness and malnutrition.  Iron supplements ordered but pt not tolerating this.  Will recheck CBC and iron levels today and adjust treatment accordingly Elevated WBC, purulence in JP: CT concerning for possible anastomotic dehisscence vs abscess from previous perforated rectal cancer, JP drainage catheter appears to be in appropriate position, d/c Zosyn 11/14.  wbc trending down.  Plan to d/c JP prior to d/c home Ambulate in hall TID, appreciate PT/OT involvement  Urinary retention: foley replaced on Mon 11/1.  foley out again 11/7, failed voiding trial 11/9 with elevated PVR.   Foley replaced, will need urology f/u as outpatient Dispo: pt has a temporary housing plan in place at discharge in [place] with his Aunt.  TOC team consulted and working with housing authority to assist with long term housing.      LOS: 21 days    Rosario Adie [id number]

## 2021-09-06 ENCOUNTER — Inpatient Hospital Stay (HOSPITAL_COMMUNITY): Payer: Medicare HMO

## 2021-09-06 ENCOUNTER — Encounter (HOSPITAL_COMMUNITY): Payer: Self-pay | Admitting: General Surgery

## 2021-09-06 DIAGNOSIS — K56609 Unspecified intestinal obstruction, unspecified as to partial versus complete obstruction: Secondary | ICD-10-CM | POA: Diagnosis not present

## 2021-09-06 DIAGNOSIS — N3289 Other specified disorders of bladder: Secondary | ICD-10-CM | POA: Diagnosis not present

## 2021-09-06 DIAGNOSIS — K6389 Other specified diseases of intestine: Secondary | ICD-10-CM | POA: Diagnosis not present

## 2021-09-06 DIAGNOSIS — N1330 Unspecified hydronephrosis: Secondary | ICD-10-CM | POA: Diagnosis not present

## 2021-09-06 LAB — BASIC METABOLIC PANEL
Anion gap: 8 (ref 5–15)
BUN: 29 mg/dL — ABNORMAL HIGH (ref 8–23)
CO2: 30 mmol/L (ref 22–32)
Calcium: 8.6 mg/dL — ABNORMAL LOW (ref 8.9–10.3)
Chloride: 100 mmol/L (ref 98–111)
Creatinine, Ser: 0.8 mg/dL (ref 0.61–1.24)
GFR, Estimated: >60 mL/min (ref >60)
Glucose, Bld: 103 mg/dL — ABNORMAL HIGH (ref 70–99)
Potassium: 3.4 mmol/L — ABNORMAL LOW (ref 3.5–5.1)
Sodium: 138 mmol/L (ref 135–145)

## 2021-09-06 LAB — MAGNESIUM: Magnesium: 2.1 mg/dL (ref 1.7–2.4)

## 2021-09-06 LAB — PHOSPHORUS: Phosphorus: 2 mg/dL — ABNORMAL LOW (ref 2.5–4.6)

## 2021-09-06 MED ORDER — POTASSIUM CHLORIDE 10 MEQ/100ML IV SOLN
10.0000 meq | INTRAVENOUS | Status: AC
  Administered 2021-09-06 (×3): 10 meq via INTRAVENOUS
  Filled 2021-09-06 (×3): qty 100

## 2021-09-06 MED ORDER — IOHEXOL 350 MG/ML SOLN
80.0000 mL | Freq: Once | INTRAVENOUS | Status: AC | PRN
  Administered 2021-09-06: 80 mL via INTRAVENOUS

## 2021-09-06 MED ORDER — DIATRIZOATE MEGLUMINE & SODIUM 66-10 % PO SOLN
30.0000 mL | Freq: Once | ORAL | Status: AC
  Administered 2021-09-06: 30 mL via ORAL
  Filled 2021-09-06: qty 30

## 2021-09-06 MED ORDER — POTASSIUM PHOSPHATES 15 MMOLE/5ML IV SOLN
15.0000 mmol | Freq: Once | INTRAVENOUS | Status: AC
  Administered 2021-09-06: 15 mmol via INTRAVENOUS
  Filled 2021-09-06: qty 5

## 2021-09-06 MED ORDER — TRAVASOL 10 % IV SOLN
INTRAVENOUS | Status: AC
  Filled 2021-09-06: qty 1020

## 2021-09-06 NOTE — Progress Notes (Signed)
PHARMACY - TOTAL PARENTERAL NUTRITION CONSULT NOTE   Indication: intolerance to enteral feeding  Patient Measurements: Height: 5\' 6"  (167.6 cm) Weight: 52.7 kg (116 lb 2.9 oz) IBW/kg (Calculated) : 63.8 TPN AdjBW (KG): 59.7 Body mass index is 18.75 kg/m.  Assessment:  65 yo M presents on 10/28 for eval of rectal cancer. S/P LAR and diverting ileostomy. Has been NPO for 7 days and NG tube in place with high output. Pharmacy consulted to start TPN. Stopped TPN on 11/15 but had to restart on 11/17 as patient vomited as is unable to tolerate PO.  Glucose / Insulin: No Hx DM. AM glucos controlled. SSI stopped on 11/11 Electrolytes: wnl exc K borderline low at 3.4 and Phos down to 2.0. CoCa down to 9.4 Renal: SCr and BUN improving  Hepatic: Albumin remains low ; LFTs WNL, TG stable WNL  Intake / Output; MIVF:  NS 75 ml/hr per CCS No NGT,  stool 800/24 hrs. UOP 1700 ml/h24 hr. Drain has minimal output GI Imaging: 11/4 CT abdomen shows fluid collection, SBO, bladder thickening  11/17 KUB: Dilated small bowel loops are noted concerning for distal small bowel obstruction GI Surgeries / Procedures:  10/28: Diverting ileostomy   Central access: Implanted Port TPN start date: 11/5 >> 11/15. Resumed 11/17>>  Nutritional Goals:  Goal TPN rate is 85 mL/hr (provides 102 g of protein and 1958 kcals per day)  RD Assessment: Estimated Needs Total Energy Estimated Needs: 1950-2150 Total Protein Estimated Needs: 100-110g Total Fluid Estimated Needs: 2L/day  Current Nutrition:  NPO TPN  Plan:  16mmol of KPhos IV x 1  Continue TPN at 30mL/hr at 1800 Electrolytes in TPN: Na 145mEq/L, K 59mEq/L, Ca 27mEq/L, Mg 8mEq/L, and Phos 45mmol/L. Cl:Ac 1:2 Add standard MVI and trace elements to TPN No SSI. Monitor AM glucose Continue NS at 89ml/hr per CCS Monitor Bmet, Mg, and Phos tomorrow, TPN labs on Mon/Thurs  Elenor Quinones, PharmD, BCPS, BCIDP Clinical Pharmacist 09/06/2021 7:13 AM

## 2021-09-06 NOTE — Progress Notes (Signed)
22 Days Post-Op   Subjective/Chief Complaint: Ambulating, otherwise no change   Objective: Vital signs in last 24 hours: Temp:  [97.4 F (36.3 C)-98.3 F (36.8 C)] 97.4 F (36.3 C) (11/19 0450) Pulse Rate:  [83-87] 85 (11/19 0450) Resp:  [14-16] 16 (11/19 0450) BP: (105-112)/(63-71) 105/71 (11/19 0450) SpO2:  [99 %-100 %] 99 % (11/19 0450) Weight:  [52.7 kg] 52.7 kg (11/19 0454) Last BM Date: 09/05/21  Intake/Output from previous day: 11/18 0701 - 11/19 0700 In: 2500 [I.V.:2347.6; IV Piggyback:152.4] Out: 2515 [Urine:1700; Drains:15; Stool:800] Intake/Output this shift: Total I/O In: 0  Out: 350 [Urine:300; Stool:50]  General appearance: alert, NAD GI: distended, nontender, ostomy pink with some liquid stool Incisions OK, JP drain with min clear drainage    Lab Results:  Recent Labs    09/05/21 0745  WBC 7.2  HGB 8.0*  HCT 25.0*  PLT 718*   BMET Recent Labs    09/05/21 0745 09/06/21 0405  NA 132* 138  K 3.4* 3.4*  CL 94* 100  CO2 28 30  GLUCOSE 141* 103*  BUN 51* 29*  CREATININE 1.36* 0.80  CALCIUM 9.2 8.6*   PT/INR No results for input(s): LABPROT, INR in the last 72 hours. ABG No results for input(s): PHART, HCO3 in the last 72 hours.  Invalid input(s): PCO2, PO2  Studies/Results: No results found.  Anti-infectives: Anti-infectives (From admission, onward)    Start     Dose/Rate Route Frequency Ordered Stop   08/26/21 1000  metroNIDAZOLE (FLAGYL) IVPB 500 mg  Status:  Discontinued        500 mg 100 mL/hr over 60 Minutes Intravenous Every 12 hours 08/26/21 0830 08/26/21 0910   08/26/21 1000  piperacillin-tazobactam (ZOSYN) IVPB 3.375 g  Status:  Discontinued        3.375 g 12.5 mL/hr over 240 Minutes Intravenous Every 8 hours 08/26/21 0910 09/01/21 0819   08/25/21 1030  metroNIDAZOLE (FLAGYL) tablet 500 mg  Status:  Discontinued        500 mg Per Tube Every 12 hours 08/25/21 0935 08/26/21 0830   08/25/21 1000  cefTRIAXone (ROCEPHIN) 2 g  in sodium chloride 0.9 % 100 mL IVPB  Status:  Discontinued       Note to Pharmacy: Pharmacy may adjust dosing strength / duration / interval for maximal efficacy   2 g 200 mL/hr over 30 Minutes Intravenous Every 24 hours 08/25/21 0823 08/26/21 0910   08/25/21 1000  metroNIDAZOLE (FLAGYL) tablet 500 mg  Status:  Discontinued        500 mg Oral Every 12 hours 08/25/21 0837 08/25/21 0935   08/07/2021 2000  cefoTEtan (CEFOTAN) 2 g in sodium chloride 0.9 % 100 mL IVPB        2 g 200 mL/hr over 30 Minutes Intravenous Every 12 hours 07/22/2021 1647 07/26/2021 2046   07/23/2021 0600  cefoTEtan (CEFOTAN) 2 g in sodium chloride 0.9 % 100 mL IVPB        2 g 200 mL/hr over 30 Minutes Intravenous On call to O.R. 08/03/2021 0513 07/30/2021 0806       Assessment/Plan: POD 46 lar, ileostomy -ileus with output unsure of source, no better, cr normal today -ct today -continue tpn -continue foley as per prior notes  Rolm Bookbinder 09/06/2021

## 2021-09-07 LAB — BASIC METABOLIC PANEL
Anion gap: 6 (ref 5–15)
BUN: 19 mg/dL (ref 8–23)
CO2: 28 mmol/L (ref 22–32)
Calcium: 8.9 mg/dL (ref 8.9–10.3)
Chloride: 103 mmol/L (ref 98–111)
Creatinine, Ser: 0.83 mg/dL (ref 0.61–1.24)
GFR, Estimated: >60 mL/min (ref >60)
Glucose, Bld: 119 mg/dL — ABNORMAL HIGH (ref 70–99)
Potassium: 3.7 mmol/L (ref 3.5–5.1)
Sodium: 137 mmol/L (ref 135–145)

## 2021-09-07 LAB — PHOSPHORUS: Phosphorus: 2.9 mg/dL (ref 2.5–4.6)

## 2021-09-07 LAB — MAGNESIUM: Magnesium: 2.1 mg/dL (ref 1.7–2.4)

## 2021-09-07 MED ORDER — POTASSIUM CHLORIDE 10 MEQ/100ML IV SOLN
10.0000 meq | INTRAVENOUS | Status: AC
  Administered 2021-09-07 (×3): 10 meq via INTRAVENOUS
  Filled 2021-09-07: qty 100

## 2021-09-07 MED ORDER — TRAVASOL 10 % IV SOLN
INTRAVENOUS | Status: AC
  Filled 2021-09-07: qty 1020

## 2021-09-07 NOTE — Progress Notes (Signed)
23 Days Post-Op   Subjective/Chief Complaint: Some liquid in bag, small volume emesis overnight otherwise unchanged, ct yesterday   Objective: Vital signs in last 24 hours: Temp:  [98.1 F (36.7 C)-98.4 F (36.9 C)] 98.2 F (36.8 C) (11/20 0527) Pulse Rate:  [85-87] 85 (11/20 0527) Resp:  [14-16] 16 (11/20 0527) BP: (107-125)/(69-77) 125/77 (11/20 0527) SpO2:  [99 %-100 %] 99 % (11/20 0527) Weight:  [57.2 kg] 57.2 kg (11/20 0500) Last BM Date: 09/07/21  Intake/Output from previous day: 11/19 0701 - 11/20 0700 In: 4159.7 [I.V.:3722.9; IV Piggyback:436.8] Out: 2805 [Urine:1250; Drains:5; ELFYB:0175] Intake/Output this shift: No intake/output data recorded.  Gen alert, nad Pulm effort normal Cv regular Ab distended ,nontender, ostomy with small volume liquid in it, jp with purulent fluid   Lab Results:  Recent Labs    09/05/21 0745  WBC 7.2  HGB 8.0*  HCT 25.0*  PLT 718*   BMET Recent Labs    09/06/21 0405 09/07/21 0435  NA 138 137  K 3.4* 3.7  CL 100 103  CO2 30 28  GLUCOSE 103* 119*  BUN 29* 19  CREATININE 0.80 0.83  CALCIUM 8.6* 8.9   PT/INR No results for input(s): LABPROT, INR in the last 72 hours. ABG No results for input(s): PHART, HCO3 in the last 72 hours.  Invalid input(s): PCO2, PO2  Studies/Results: CT ABDOMEN PELVIS W CONTRAST  Result Date: 09/06/2021 CLINICAL DATA:  Abdominal abscess/infection suspected. Rectal cancer ?abscess lower abdomen pain Recent surgery to remove cancer 07/23/2021. History of urethral strictures. EXAM: CT ABDOMEN AND PELVIS WITH CONTRAST TECHNIQUE: Multidetector CT imaging of the abdomen and pelvis was performed using the standard protocol following bolus administration of intravenous contrast. CONTRAST:  3mL OMNIPAQUE IOHEXOL 350 MG/ML SOLN, PO contrast administered with patient only tolerating 1 bottle of oral contrast for exam. COMPARISON:  CT abdomen pelvis 08/22/2021 FINDINGS: Lines and tubes: Right abdominal  approach surgical drain with tip terminating within the pelvis in the expected region of the prostate. Foley catheter with tip and balloon terminating within the urinary bladder. Lower chest: Bilateral lower lobe linear atelectasis versus scarring. Left anterior descending coronary artery calcification. Hepatobiliary: No focal liver abnormality. Layering hyperdensity within the gallbladder lumen may represent tiny gallstones versus sludge plated no gallbladder wall thickening or pericholecystic fluid. No biliary dilatation. Pancreas: No focal lesion. Normal pancreatic contour. No surrounding inflammatory changes. No main pancreatic ductal dilatation. Spleen: Normal in size without focal abnormality. Adrenals/Urinary Tract: No adrenal nodule bilaterally. Bilateral kidneys enhance symmetrically. Interval development of mild bilateral, left greater than right, hydroureteronephrosis. Circumferential marked urinary bladder wall thickening. Stomach/Bowel: Surgical changes related to a lower anterior resection and right abdominal diverting ileostomy formation. Stomach is within normal limits. Interval worsening of proximal to mid small bowel dilatation with gas and fluid. The small bowel measures up to at least 4.6 cm on axial images. Associated air-fluid levels noted. Transition point noted within the mid pelvis (5:70 3-86). Redemonstration of small bowel thickening noted at the transition point (5:86). Vascular/Lymphatic: No abdominal aorta or iliac aneurysm. Mild atherosclerotic plaque of the aorta and its branches. No abdominal, pelvic, or inguinal lymphadenopathy. Reproductive: Prostate is not visualized. Other: No intraperitoneal free fluid. No intraperitoneal free gas. Interval increase in size of a gas and fluid collection within the LAR resection bed/presacral region measuring approximately 5.3 x 5.2 x 5.9 cm (from 5.1 x 5.3 x 3.3 cm) with markedly limited evaluation in the setting of no PO contrast within the  pelvis. Musculoskeletal: Subcutaneus  soft tissue emphysema along the right anterior abdominal wall likely postsurgical. Similar findings on the left. No suspicious lytic or blastic osseous lesions. No acute displaced fracture. Multilevel degenerative changes of the spine. IMPRESSION: 1. Small-bowel obstruction with transition point within the mid pelvis in a patient status post lower anterior resection and right abdominal diverting ileostomy formation. Redemonstration of small bowel thickening at the transition point. 2. Interval increase in size of a 5.3 x 5.2 x 5.9 cm gas and fluid abscess formation within the LAR resection bed/presacral region with markedly limited evaluation. Presence of an anastomotic leak could be further interrogated by fluoroscopy or water soluble rectal contrast if desired. 3. Circumferential urinary bladder wall thickening. Foley catheter in appropriate position. Correlate with urinalysis for infection. 4. Interval development of mild bilateral, left greater than right, hydroureteronephrosis. No nephroureterolithiasis. 5. Cholelithiasis versus gallbladder sludge. 6. Aortic Atherosclerosis (ICD10-I70.0). Electronically Signed   By: Iven Finn M.D.   On: 09/06/2021 20:41    Anti-infectives: Anti-infectives (From admission, onward)    Start     Dose/Rate Route Frequency Ordered Stop   08/26/21 1000  metroNIDAZOLE (FLAGYL) IVPB 500 mg  Status:  Discontinued        500 mg 100 mL/hr over 60 Minutes Intravenous Every 12 hours 08/26/21 0830 08/26/21 0910   08/26/21 1000  piperacillin-tazobactam (ZOSYN) IVPB 3.375 g  Status:  Discontinued        3.375 g 12.5 mL/hr over 240 Minutes Intravenous Every 8 hours 08/26/21 0910 09/01/21 0819   08/25/21 1030  metroNIDAZOLE (FLAGYL) tablet 500 mg  Status:  Discontinued        500 mg Per Tube Every 12 hours 08/25/21 0935 08/26/21 0830   08/25/21 1000  cefTRIAXone (ROCEPHIN) 2 g in sodium chloride 0.9 % 100 mL IVPB  Status:  Discontinued        Note to Pharmacy: Pharmacy may adjust dosing strength / duration / interval for maximal efficacy   2 g 200 mL/hr over 30 Minutes Intravenous Every 24 hours 08/25/21 0823 08/26/21 0910   08/25/21 1000  metroNIDAZOLE (FLAGYL) tablet 500 mg  Status:  Discontinued        500 mg Oral Every 12 hours 08/25/21 0837 08/25/21 0935   07/20/2021 2000  cefoTEtan (CEFOTAN) 2 g in sodium chloride 0.9 % 100 mL IVPB        2 g 200 mL/hr over 30 Minutes Intravenous Every 12 hours 08/02/2021 1647 07/29/2021 2046   08/03/2021 0600  cefoTEtan (CEFOTAN) 2 g in sodium chloride 0.9 % 100 mL IVPB        2 g 200 mL/hr over 30 Minutes Intravenous On call to O.R. 08/14/2021 0513 07/30/2021 0806       Assessment/Plan: POD 23 lar, ileostomy -ct with postop sbo in pelvis with dilated small bowel and increased in presacral collection -discussed ng tube would be indicated with distention, ct and emesis but does not want to do this -continue tpn, follow drainage -lovenox, scds    Rolm Bookbinder 09/07/2021

## 2021-09-07 NOTE — Progress Notes (Signed)
PHARMACY - TOTAL PARENTERAL NUTRITION CONSULT NOTE   Indication: intolerance to enteral feeding  Patient Measurements: Height: 5\' 6"  (167.6 cm) Weight: 57.2 kg (126 lb 1.7 oz) IBW/kg (Calculated) : 63.8 TPN AdjBW (KG): 59.7 Body mass index is 20.35 kg/m.  Assessment:  65 yo M presents on 10/28 for eval of rectal cancer. S/P LAR and diverting ileostomy. Has been NPO for 7 days and NG tube in place with high output. Pharmacy consulted to start TPN. Stopped TPN on 11/15 but had to restart on 11/17 as patient vomited as is unable to tolerate PO.  Glucose / Insulin: No Hx DM. AM glucos controlled. SSI stopped on 11/11 Electrolytes: WNL, K up to 3.7 and phos improved to 2.9 after replacement. CorrCa up to 9.94 Renal: SCr and BUN improving  Hepatic: Albumin remains low; LFTs WNL, TG stable WNL  Intake / Output; MIVF:  NS 75 ml/hr per CCS No NGT,  stool up to 1550 / 24 hrs. UOP 1250 ml/h24 hr. Drain has minimal output GI Imaging: 11/4 CT abdomen shows fluid collection, SBO, bladder thickening  11/17 KUB: Dilated small bowel loops are noted concerning for distal small bowel obstruction 11/19 CT:  Small-bowel obstruction, increase in size of gas and fluid abscess formation  GI Surgeries / Procedures:  10/28: Diverting ileostomy   Central access: Implanted Port TPN start date: 11/5 >> 11/15. Resumed 11/17>>  Nutritional Goals:  Goal TPN rate is 85 mL/hr (provides 102 g of protein and 1958 kcals per day)  RD Assessment: Estimated Needs Total Energy Estimated Needs: 1950-2150 Total Protein Estimated Needs: 100-110g Total Fluid Estimated Needs: 2L/day  Current Nutrition:  NPO TPN  Plan:  Now: KCl 10 mEq IV x3 runs Continue TPN at 53mL/hr at 1800 Electrolytes in TPN: Na 176mEq/L, K 110mEq/L, Ca 33mEq/L, Mg 27mEq/L, and Phos 110mmol/L. Cl:Ac 1:2 Add standard MVI and trace elements to TPN No SSI. Monitor AM glucose Continue NS at 46ml/hr per CCS TPN labs on Mon/Thurs  Gretta Arab  PharmD, BCPS Clinical Pharmacist [facility] main pharmacy [phone] 09/07/2021 7:22 AM

## 2021-09-08 LAB — COMPREHENSIVE METABOLIC PANEL
ALT: 12 U/L (ref 0–44)
AST: 13 U/L — ABNORMAL LOW (ref 15–41)
Albumin: 2.4 g/dL — ABNORMAL LOW (ref 3.5–5.0)
Alkaline Phosphatase: 49 U/L (ref 38–126)
Anion gap: 8 (ref 5–15)
BUN: 18 mg/dL (ref 8–23)
CO2: 26 mmol/L (ref 22–32)
Calcium: 8.9 mg/dL (ref 8.9–10.3)
Chloride: 102 mmol/L (ref 98–111)
Creatinine, Ser: 0.79 mg/dL (ref 0.61–1.24)
GFR, Estimated: >60 mL/min (ref >60)
Glucose, Bld: 112 mg/dL — ABNORMAL HIGH (ref 70–99)
Potassium: 3.9 mmol/L (ref 3.5–5.1)
Sodium: 136 mmol/L (ref 135–145)
Total Bilirubin: 0.3 mg/dL (ref 0.3–1.2)
Total Protein: 6.9 g/dL (ref 6.5–8.1)

## 2021-09-08 LAB — PHOSPHORUS: Phosphorus: 3.6 mg/dL (ref 2.5–4.6)

## 2021-09-08 LAB — TRIGLYCERIDES: Triglycerides: 58 mg/dL (ref <150)

## 2021-09-08 LAB — MAGNESIUM: Magnesium: 2 mg/dL (ref 1.7–2.4)

## 2021-09-08 MED ORDER — SODIUM CHLORIDE 0.9 % IV SOLN
2.0000 g | INTRAVENOUS | Status: AC
  Administered 2021-09-08 – 2021-09-14 (×7): 2 g via INTRAVENOUS
  Filled 2021-09-08 (×7): qty 20

## 2021-09-08 MED ORDER — TRAVASOL 10 % IV SOLN
INTRAVENOUS | Status: AC
  Filled 2021-09-08: qty 1020

## 2021-09-08 NOTE — Progress Notes (Signed)
Physical Therapy Treatment Patient Details Name: Phillip Brooks MRN: [id number] DOB: 1956/08/27 Today's Date: 09/08/2021   History of Present Illness Patient is 65 y.o. male s/p robotic LAR, diverting ostomy on 08/01/2021 for advanced mid rectal cancer.    PT Comments    POD # 24 Assisted OOB to amb required increased time and steps.  First opt sat EOB x 7 min to empty his Ostomy.  Then assisted with a whole bed change due to soiled from ?Marland Kitchen  Assisted with amb an increased distance well over 800 feet with x 3 standing rest breaks.  Pt still requires walker for stability/balance.  "Not ready yet" to advance to cane, stated pt.   Pt stated, "now I have an infection" and he is back down to NPO.   Recommendations for follow up therapy are one component of a multi-disciplinary discharge planning process, led by the attending physician.  Recommendations may be updated based on patient status, additional functional criteria and insurance authorization.  Follow Up Recommendations  Home health PT     Assistance Recommended at Discharge Intermittent Supervision/Assistance  Equipment Recommendations  Rolling walker (2 wheels)    Recommendations for Other Services       Precautions / Restrictions Precautions Precaution Comments: ileostomy, R JP Drain, catheter     Mobility  Bed Mobility Overal bed mobility: Modified Independent             General bed mobility comments: Mod I for bed mobility with use of rail and increased time.    Transfers Overall transfer level: Needs assistance Equipment used: Rolling walker (2 wheels) Transfers: Sit to/from Stand Sit to Stand: Modified independent (Device/Increase time)           General transfer comment: good safety cognition and use of hands to steady self    Ambulation/Gait   Gait Distance (Feet): 800 Feet Assistive device: Rolling walker (2 wheels) Gait Pattern/deviations: Step-through pattern;Decreased stride length;Trunk  flexed Gait velocity: decreased     General Gait Details: increased amb distance this session.  Still requires walker for stability.   Stairs             Wheelchair Mobility    Modified Rankin (Stroke Patients Only)       Balance                                            Cognition Arousal/Alertness: Awake/alert                                     General Comments: AxO x 3 very motivated and progressing well with his mobility. 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        Exercises      General Comments        Pertinent Vitals/Pain Pain Assessment: Faces Faces Pain Scale: Hurts a little bit Pain Location: abd "I have an infection" Pain Descriptors / Indicators: Aching Pain Intervention(s): Monitored during session    Home Living                          Prior Function            PT Goals (current goals can now be  found in the care plan section) Progress towards PT goals: Progressing toward goals    Frequency    Min 3X/week      PT Plan Current plan remains appropriate    Co-evaluation              AM-PAC PT "6 Clicks" Mobility   Outcome Measure  Help needed turning from your back to your side while in a flat bed without using bedrails?: None Help needed moving from lying on your back to sitting on the side of a flat bed without using bedrails?: None Help needed moving to and from a bed to a chair (including a wheelchair)?: None Help needed standing up from a chair using your arms (e.g., wheelchair or bedside chair)?: A Little Help needed to walk in hospital room?: A Little Help needed climbing 3-5 steps with a railing? : A Little 6 Click Score: 21    End of Session Equipment Utilized During Treatment: Gait belt Activity Tolerance: Patient tolerated treatment well Patient left: in bed;with call bell/phone within reach;with family/visitor  present Nurse Communication: Mobility status PT Visit Diagnosis: Muscle weakness (generalized) (M62.81);Difficulty in walking, not elsewhere classified (R26.2);Unsteadiness on feet (R26.81);Pain     Time: 5072-2575 PT Time Calculation (min) (ACUTE ONLY): 52 min  Charges:  $Gait Training: 23-37 mins $Therapeutic Activity: 8-22 mins                     Rica Koyanagi  PTA Acute  Rehabilitation Services Pager      [phone] Office      [phone]

## 2021-09-08 NOTE — Progress Notes (Signed)
24 Days Post-Op robotic LAR, diverting ostomy  Subjective/Chief Complaint: Feels better after being NPO, ambulating better in hall  Objective: Vital signs in last 24 hours: Temp:  [98.5 F (36.9 C)-98.9 F (37.2 C)] 98.5 F (36.9 C) (11/21 0609) Pulse Rate:  [87-90] 90 (11/21 0609) Resp:  [18] 18 (11/21 0609) BP: (116-120)/(76-78) 120/78 (11/21 0609) SpO2:  [99 %-100 %] 100 % (11/21 0609) Last BM Date: 09/07/21  Intake/Output from previous day: 11/20 0701 - 11/21 0700 In: 3971.9 [I.V.:3730.5; IV Piggyback:241.4] Out: 2480 [Urine:875; Drains:5; QIONG:2952] Intake/Output this shift: Total I/O In: -  Out: 1201 [Urine:650; Drains:1; Stool:550]  General appearance: alert, cooperative, and no distress GI: distended, tender in lower abdomen; ostomy pink with increased liquid stool Incisions OK, JP drain with min clear drainage  Lab Results:  No results for input(s): WBC, HGB, HCT, PLT in the last 72 hours.  BMET Recent Labs    09/07/21 0435 09/08/21 0413  NA 137 136  K 3.7 3.9  CL 103 102  CO2 28 26  GLUCOSE 119* 112*  BUN 19 18  CREATININE 0.83 0.79  CALCIUM 8.9 8.9    PT/INR No results for input(s): LABPROT, INR in the last 72 hours. ABG No results for input(s): PHART, HCO3 in the last 72 hours.  Invalid input(s): PCO2, PO2  Studies/Results: CT ABDOMEN PELVIS W CONTRAST  Result Date: 09/06/2021 CLINICAL DATA:  Abdominal abscess/infection suspected. Rectal cancer ?abscess lower abdomen pain Recent surgery to remove cancer 08/14/2021. History of urethral strictures. EXAM: CT ABDOMEN AND PELVIS WITH CONTRAST TECHNIQUE: Multidetector CT imaging of the abdomen and pelvis was performed using the standard protocol following bolus administration of intravenous contrast. CONTRAST:  21mL OMNIPAQUE IOHEXOL 350 MG/ML SOLN, PO contrast administered with patient only tolerating 1 bottle of oral contrast for exam. COMPARISON:  CT abdomen pelvis 08/22/2021 FINDINGS: Lines and  tubes: Right abdominal approach surgical drain with tip terminating within the pelvis in the expected region of the prostate. Foley catheter with tip and balloon terminating within the urinary bladder. Lower chest: Bilateral lower lobe linear atelectasis versus scarring. Left anterior descending coronary artery calcification. Hepatobiliary: No focal liver abnormality. Layering hyperdensity within the gallbladder lumen may represent tiny gallstones versus sludge plated no gallbladder wall thickening or pericholecystic fluid. No biliary dilatation. Pancreas: No focal lesion. Normal pancreatic contour. No surrounding inflammatory changes. No main pancreatic ductal dilatation. Spleen: Normal in size without focal abnormality. Adrenals/Urinary Tract: No adrenal nodule bilaterally. Bilateral kidneys enhance symmetrically. Interval development of mild bilateral, left greater than right, hydroureteronephrosis. Circumferential marked urinary bladder wall thickening. Stomach/Bowel: Surgical changes related to a lower anterior resection and right abdominal diverting ileostomy formation. Stomach is within normal limits. Interval worsening of proximal to mid small bowel dilatation with gas and fluid. The small bowel measures up to at least 4.6 cm on axial images. Associated air-fluid levels noted. Transition point noted within the mid pelvis (5:70 3-86). Redemonstration of small bowel thickening noted at the transition point (5:86). Vascular/Lymphatic: No abdominal aorta or iliac aneurysm. Mild atherosclerotic plaque of the aorta and its branches. No abdominal, pelvic, or inguinal lymphadenopathy. Reproductive: Prostate is not visualized. Other: No intraperitoneal free fluid. No intraperitoneal free gas. Interval increase in size of a gas and fluid collection within the LAR resection bed/presacral region measuring approximately 5.3 x 5.2 x 5.9 cm (from 5.1 x 5.3 x 3.3 cm) with markedly limited evaluation in the setting of no PO  contrast within the pelvis. Musculoskeletal: Subcutaneus soft tissue emphysema along the  right anterior abdominal wall likely postsurgical. Similar findings on the left. No suspicious lytic or blastic osseous lesions. No acute displaced fracture. Multilevel degenerative changes of the spine. IMPRESSION: 1. Small-bowel obstruction with transition point within the mid pelvis in a patient status post lower anterior resection and right abdominal diverting ileostomy formation. Redemonstration of small bowel thickening at the transition point. 2. Interval increase in size of a 5.3 x 5.2 x 5.9 cm gas and fluid abscess formation within the LAR resection bed/presacral region with markedly limited evaluation. Presence of an anastomotic leak could be further interrogated by fluoroscopy or water soluble rectal contrast if desired. 3. Circumferential urinary bladder wall thickening. Foley catheter in appropriate position. Correlate with urinalysis for infection. 4. Interval development of mild bilateral, left greater than right, hydroureteronephrosis. No nephroureterolithiasis. 5. Cholelithiasis versus gallbladder sludge. 6. Aortic Atherosclerosis (ICD10-I70.0). Electronically Signed   By: Iven Finn M.D.   On: 09/06/2021 20:41    Anti-infectives: Anti-infectives (From admission, onward)    Start     Dose/Rate Route Frequency Ordered Stop   09/08/21 1400  cefTRIAXone (ROCEPHIN) 2 g in sodium chloride 0.9 % 100 mL IVPB       Note to Pharmacy: Pharmacy may adjust dosing strength / duration / interval for maximal efficacy   2 g 200 mL/hr over 30 Minutes Intravenous Every 24 hours 09/08/21 1307 09/15/21 1359   08/26/21 1000  metroNIDAZOLE (FLAGYL) IVPB 500 mg  Status:  Discontinued        500 mg 100 mL/hr over 60 Minutes Intravenous Every 12 hours 08/26/21 0830 08/26/21 0910   08/26/21 1000  piperacillin-tazobactam (ZOSYN) IVPB 3.375 g  Status:  Discontinued        3.375 g 12.5 mL/hr over 240 Minutes Intravenous  Every 8 hours 08/26/21 0910 09/01/21 0819   08/25/21 1030  metroNIDAZOLE (FLAGYL) tablet 500 mg  Status:  Discontinued        500 mg Per Tube Every 12 hours 08/25/21 0935 08/26/21 0830   08/25/21 1000  cefTRIAXone (ROCEPHIN) 2 g in sodium chloride 0.9 % 100 mL IVPB  Status:  Discontinued       Note to Pharmacy: Pharmacy may adjust dosing strength / duration / interval for maximal efficacy   2 g 200 mL/hr over 30 Minutes Intravenous Every 24 hours 08/25/21 0823 08/26/21 0910   08/25/21 1000  metroNIDAZOLE (FLAGYL) tablet 500 mg  Status:  Discontinued        500 mg Oral Every 12 hours 08/25/21 0837 08/25/21 0935   07/27/2021 2000  cefoTEtan (CEFOTAN) 2 g in sodium chloride 0.9 % 100 mL IVPB        2 g 200 mL/hr over 30 Minutes Intravenous Every 12 hours 08/07/2021 1647 08/13/2021 2046   07/24/2021 0600  cefoTEtan (CEFOTAN) 2 g in sodium chloride 0.9 % 100 mL IVPB        2 g 200 mL/hr over 30 Minutes Intravenous On call to O.R. 08/09/2021 0513 08/09/2021 0806       Assessment/Plan: s/p Procedure(s): XI ROBOTIC ASSISTED LOWER ANTERIOR RESECTION, WITH BILATERAL TAP BLOCK AND INTRAOPERATIVE ASSESSMENT USING ICG (N/A) DIVERTING ILEOSTOMY (N/A) CYSTOSCOPY with FIREFLY INJECTION AND URETHERAL DILATION (N/A) Expected post op Ileus: resolving, advance to soft diet, cont to monitor ostomy output, NG out 11/7, AXR with continued ileus on 11/17.  NPO, NGT PRN, will repeat CT once Cr better to evaluate further Severe malnutrition, prolonged ileus: pt not eating well off TPN, Restarted TPN 11/17 High output ileostomy: will cont  fiber pills but hold imodium due to vomiting. Anemia: most likely related to chronic illness and malnutrition.  Iron supplements ordered but pt not tolerating this.  Iron levels low. Will try IV iron  possible anastomotic dehisscence vs abscess from previous perforated rectal cancer: JP drainage catheter appears to be in appropriate position, d/c Zosyn 11/14.  wbc normal.  - Rpt CT shows  increased abscess size with drain in correct position.  Will ask RN to restart drain flushes.  No sign of systemic infection at this time.  Discussed with IR on 11/21.  No further targets available for additional drainage.  Will repeat a 7 day course of abx.   Ambulate in hall TID, appreciate PT/OT involvement  Urinary retention: foley replaced on Mon 11/1.  foley out again 11/7, failed voiding trial 11/9 with elevated PVR.   Foley replaced, will need urology f/u as outpatient Dispo: pt has a temporary housing plan in place at discharge in [place] with his Aunt.  TOC team consulted and working with housing authority to assist with long term housing.      LOS: 24 days    Rosario Adie 85/11/7739

## 2021-09-08 NOTE — Progress Notes (Signed)
24 Days Post-Op robotic LAR, diverting ostomy  Subjective/Chief Complaint: Feels better after being NPO, ambulating better in hall  Objective: Vital signs in last 24 hours: Temp:  [98.5 F (36.9 C)-98.9 F (37.2 C)] 98.5 F (36.9 C) (11/21 0609) Pulse Rate:  [85-90] 90 (11/21 0609) Resp:  [15-18] 18 (11/21 0609) BP: (116-125)/(76-78) 120/78 (11/21 0609) SpO2:  [99 %-100 %] 100 % (11/21 0609) Last BM Date: 09/07/21  Intake/Output from previous day: 11/20 0701 - 11/21 0700 In: 3971.9 [I.V.:3730.5; IV Piggyback:241.4] Out: 2480 [Urine:875; Drains:5; GLOVF:6433] Intake/Output this shift: No intake/output data recorded.  General appearance: alert, cooperative, and no distress GI: distended, tender in lower abdomen; ostomy pink with increased liquid stool Incisions OK, JP drain with min clear drainage  Lab Results:  No results for input(s): WBC, HGB, HCT, PLT in the last 72 hours.  BMET Recent Labs    09/07/21 0435 09/08/21 0413  NA 137 136  K 3.7 3.9  CL 103 102  CO2 28 26  GLUCOSE 119* 112*  BUN 19 18  CREATININE 0.83 0.79  CALCIUM 8.9 8.9    PT/INR No results for input(s): LABPROT, INR in the last 72 hours. ABG No results for input(s): PHART, HCO3 in the last 72 hours.  Invalid input(s): PCO2, PO2  Studies/Results: CT ABDOMEN PELVIS W CONTRAST  Result Date: 09/06/2021 CLINICAL DATA:  Abdominal abscess/infection suspected. Rectal cancer ?abscess lower abdomen pain Recent surgery to remove cancer 08/14/2021. History of urethral strictures. EXAM: CT ABDOMEN AND PELVIS WITH CONTRAST TECHNIQUE: Multidetector CT imaging of the abdomen and pelvis was performed using the standard protocol following bolus administration of intravenous contrast. CONTRAST:  60mL OMNIPAQUE IOHEXOL 350 MG/ML SOLN, PO contrast administered with patient only tolerating 1 bottle of oral contrast for exam. COMPARISON:  CT abdomen pelvis 08/22/2021 FINDINGS: Lines and tubes: Right abdominal approach  surgical drain with tip terminating within the pelvis in the expected region of the prostate. Foley catheter with tip and balloon terminating within the urinary bladder. Lower chest: Bilateral lower lobe linear atelectasis versus scarring. Left anterior descending coronary artery calcification. Hepatobiliary: No focal liver abnormality. Layering hyperdensity within the gallbladder lumen may represent tiny gallstones versus sludge plated no gallbladder wall thickening or pericholecystic fluid. No biliary dilatation. Pancreas: No focal lesion. Normal pancreatic contour. No surrounding inflammatory changes. No main pancreatic ductal dilatation. Spleen: Normal in size without focal abnormality. Adrenals/Urinary Tract: No adrenal nodule bilaterally. Bilateral kidneys enhance symmetrically. Interval development of mild bilateral, left greater than right, hydroureteronephrosis. Circumferential marked urinary bladder wall thickening. Stomach/Bowel: Surgical changes related to a lower anterior resection and right abdominal diverting ileostomy formation. Stomach is within normal limits. Interval worsening of proximal to mid small bowel dilatation with gas and fluid. The small bowel measures up to at least 4.6 cm on axial images. Associated air-fluid levels noted. Transition point noted within the mid pelvis (5:70 3-86). Redemonstration of small bowel thickening noted at the transition point (5:86). Vascular/Lymphatic: No abdominal aorta or iliac aneurysm. Mild atherosclerotic plaque of the aorta and its branches. No abdominal, pelvic, or inguinal lymphadenopathy. Reproductive: Prostate is not visualized. Other: No intraperitoneal free fluid. No intraperitoneal free gas. Interval increase in size of a gas and fluid collection within the LAR resection bed/presacral region measuring approximately 5.3 x 5.2 x 5.9 cm (from 5.1 x 5.3 x 3.3 cm) with markedly limited evaluation in the setting of no PO contrast within the pelvis.  Musculoskeletal: Subcutaneus soft tissue emphysema along the right anterior abdominal wall likely postsurgical.  Similar findings on the left. No suspicious lytic or blastic osseous lesions. No acute displaced fracture. Multilevel degenerative changes of the spine. IMPRESSION: 1. Small-bowel obstruction with transition point within the mid pelvis in a patient status post lower anterior resection and right abdominal diverting ileostomy formation. Redemonstration of small bowel thickening at the transition point. 2. Interval increase in size of a 5.3 x 5.2 x 5.9 cm gas and fluid abscess formation within the LAR resection bed/presacral region with markedly limited evaluation. Presence of an anastomotic leak could be further interrogated by fluoroscopy or water soluble rectal contrast if desired. 3. Circumferential urinary bladder wall thickening. Foley catheter in appropriate position. Correlate with urinalysis for infection. 4. Interval development of mild bilateral, left greater than right, hydroureteronephrosis. No nephroureterolithiasis. 5. Cholelithiasis versus gallbladder sludge. 6. Aortic Atherosclerosis (ICD10-I70.0). Electronically Signed   By: Iven Finn M.D.   On: 09/06/2021 20:41    Anti-infectives: Anti-infectives (From admission, onward)    Start     Dose/Rate Route Frequency Ordered Stop   08/26/21 1000  metroNIDAZOLE (FLAGYL) IVPB 500 mg  Status:  Discontinued        500 mg 100 mL/hr over 60 Minutes Intravenous Every 12 hours 08/26/21 0830 08/26/21 0910   08/26/21 1000  piperacillin-tazobactam (ZOSYN) IVPB 3.375 g  Status:  Discontinued        3.375 g 12.5 mL/hr over 240 Minutes Intravenous Every 8 hours 08/26/21 0910 09/01/21 0819   08/25/21 1030  metroNIDAZOLE (FLAGYL) tablet 500 mg  Status:  Discontinued        500 mg Per Tube Every 12 hours 08/25/21 0935 08/26/21 0830   08/25/21 1000  cefTRIAXone (ROCEPHIN) 2 g in sodium chloride 0.9 % 100 mL IVPB  Status:  Discontinued       Note  to Pharmacy: Pharmacy may adjust dosing strength / duration / interval for maximal efficacy   2 g 200 mL/hr over 30 Minutes Intravenous Every 24 hours 08/25/21 0823 08/26/21 0910   08/25/21 1000  metroNIDAZOLE (FLAGYL) tablet 500 mg  Status:  Discontinued        500 mg Oral Every 12 hours 08/25/21 0837 08/25/21 0935   07/29/2021 2000  cefoTEtan (CEFOTAN) 2 g in sodium chloride 0.9 % 100 mL IVPB        2 g 200 mL/hr over 30 Minutes Intravenous Every 12 hours 08/07/2021 1647 07/25/2021 2046   08/08/2021 0600  cefoTEtan (CEFOTAN) 2 g in sodium chloride 0.9 % 100 mL IVPB        2 g 200 mL/hr over 30 Minutes Intravenous On call to O.R. 07/31/2021 0513 08/02/2021 0806       Assessment/Plan: s/p Procedure(s): XI ROBOTIC ASSISTED LOWER ANTERIOR RESECTION, WITH BILATERAL TAP BLOCK AND INTRAOPERATIVE ASSESSMENT USING ICG (N/A) DIVERTING ILEOSTOMY (N/A) CYSTOSCOPY with FIREFLY INJECTION AND URETHERAL DILATION (N/A) Expected post op Ileus: resolving, advance to soft diet, cont to monitor ostomy output, NG out 11/7, AXR with continued ileus on 11/17.  NPO, NGT PRN, will repeat CT once Cr better to evaluate further Severe malnutrition, prolonged ileus: pt not eating well off TPN, Restarted TPN 11/17 High output ileostomy: will cont fiber pills but hold imodium due to vomiting. Anemia: most likely related to chronic illness and malnutrition.  Iron supplements ordered but pt not tolerating this.  Iron levels low. Will try IV iron  Elevated WBC, purulence in JP: CT concerning for possible anastomotic dehisscence vs abscess from previous perforated rectal cancer, JP drainage catheter appears to be in appropriate  position, d/c Zosyn 11/14.  wbc normal.  Rpt CT shows increased abscess size with drain in correct position.  Will ask RN to restart drain flushes.  No sign of systemic infection at this time Ambulate in hall TID, appreciate PT/OT involvement  Urinary retention: foley replaced on Mon 11/1.  foley out again 11/7,  failed voiding trial 11/9 with elevated PVR.   Foley replaced, will need urology f/u as outpatient Dispo: pt has a temporary housing plan in place at discharge in [place] with his Aunt.  TOC team consulted and working with housing authority to assist with long term housing.      LOS: 24 days    Rosario Adie [phone]/25/[phone]

## 2021-09-08 NOTE — Progress Notes (Signed)
PHARMACY - TOTAL PARENTERAL NUTRITION CONSULT NOTE   Indication: intolerance to enteral feeding  Patient Measurements: Height: 5\' 6"  (167.6 cm) Weight: 57.2 kg (126 lb 1.7 oz) IBW/kg (Calculated) : 63.8 TPN AdjBW (KG): 59.7 Body mass index is 20.35 kg/m.  Assessment:  65 yo M presents on 10/28 for eval of rectal cancer. S/P LAR and diverting ileostomy. Has been NPO for 7 days and NG tube in place with high output. Pharmacy consulted to start TPN. Stopped TPN on 11/15 but had to restart on 11/17 as patient vomited as is unable to tolerate PO.  Glucose / Insulin: No Hx DM. CBGs/SSI stopped on 11/11.  AM glucos controlled. Electrolytes: WNL, Na down to 136, CorrCa up to 10.18 Renal: SCr and BUN wnl  Hepatic: Albumin remains low; LFTs WNL, TG 58 Intake / Output; MIVF:  NS 75 ml/hr per CCS No NGT,  stool up to 1600 / 24 hrs. UOP down to 875 ml/h24 hr. Drain has minimal output GI Imaging: 11/4 CT abdomen shows fluid collection, SBO, bladder thickening  11/17 KUB: Dilated small bowel loops are noted concerning for distal small bowel obstruction 11/19 CT:  Small-bowel obstruction, increase in size of gas and fluid abscess formation  GI Surgeries / Procedures:  10/28: Diverting ileostomy   Central access: Implanted Port TPN start date: 11/5 >> 11/15. Resumed 11/17>>  Nutritional Goals:  Goal TPN rate is 85 mL/hr (provides 102 g of protein and 1958 kcals per day)  RD Assessment: Estimated Needs Total Energy Estimated Needs: 1950-2150 Total Protein Estimated Needs: 100-110g Total Fluid Estimated Needs: 2L/day  Current Nutrition:  NPO TPN  Plan:  Continue TPN at 64mL/hr at 1800 Electrolytes in TPN: Na 100 mEq/L, K 15mEq/L, Ca 21mEq/L, Mg 35mEq/L, and Phos 79mmol/L. Cl:Ac 1:2 Add standard MVI and trace elements to TPN Continue NS at 23ml/hr per CCS TPN labs on Mon/Thurs; Bmet tomorrow  Gretta Arab PharmD, BCPS Clinical Pharmacist [facility] main pharmacy [phone] 09/08/2021 8:17  AM

## 2021-09-09 LAB — BASIC METABOLIC PANEL
Anion gap: 6 (ref 5–15)
BUN: 16 mg/dL (ref 8–23)
CO2: 26 mmol/L (ref 22–32)
Calcium: 8.8 mg/dL — ABNORMAL LOW (ref 8.9–10.3)
Chloride: 106 mmol/L (ref 98–111)
Creatinine, Ser: 0.68 mg/dL (ref 0.61–1.24)
GFR, Estimated: >60 mL/min (ref >60)
Glucose, Bld: 119 mg/dL — ABNORMAL HIGH (ref 70–99)
Potassium: 4 mmol/L (ref 3.5–5.1)
Sodium: 138 mmol/L (ref 135–145)

## 2021-09-09 MED ORDER — TRAVASOL 10 % IV SOLN
INTRAVENOUS | Status: AC
  Filled 2021-09-09: qty 1020

## 2021-09-09 MED ORDER — SODIUM CHLORIDE 0.9 % IV SOLN
25.0000 mg | Freq: Once | INTRAVENOUS | Status: AC
  Administered 2021-09-09: 25 mg via INTRAVENOUS
  Filled 2021-09-09: qty 0.5

## 2021-09-09 MED ORDER — SODIUM CHLORIDE 0.9 % IV SOLN
500.0000 mg | Freq: Once | INTRAVENOUS | Status: AC
  Administered 2021-09-09: 500 mg via INTRAVENOUS
  Filled 2021-09-09: qty 10

## 2021-09-09 NOTE — Progress Notes (Signed)
Occupational Therapy Treatment Patient Details Name: Phillip Brooks MRN: [id number] DOB: 1955-11-18 Today's Date: 09/09/2021   History of present illness Patient is 65 y.o. male s/p robotic LAR, diverting ostomy on 08/14/2021 for advanced mid rectal cancer.   OT comments  Patient reported increased discomfort in abdomen with noted distension on this date. Patient was MI for supine to sit on edge of bed with reminder to roll to reduce pressure on belly. Patient was min guard for standing with RW in front for brushing teeth at bedside. Patient's discharge plan remains appropriate at this time. OT will continue to follow acutely.     Recommendations for follow up therapy are one component of a multi-disciplinary discharge planning process, led by the attending physician.  Recommendations may be updated based on patient status, additional functional criteria and insurance authorization.    Follow Up Recommendations  Home health OT    Assistance Recommended at Discharge Intermittent Supervision/Assistance  Equipment Recommendations  None recommended by OT    Recommendations for Other Services      Precautions / Restrictions Precautions Precautions: Fall Precaution Comments: ileostomy, R JP Drain, catheter Restrictions Weight Bearing Restrictions: No       Mobility Bed Mobility Overal bed mobility: Modified Independent                  Transfers Overall transfer level: Needs assistance Equipment used: Rolling walker (2 wheels) Transfers: Sit to/from Stand Sit to Stand: Modified independent (Device/Increase time)           General transfer comment: good safety cognition and use of hands to steady self     Balance                                           ADL either performed or assessed with clinical judgement   ADL Overall ADL's : Needs assistance/impaired     Grooming: Standing;Set up;Oral care Grooming Details (indicate cue type and  reason): patient declined to use toothpaste stating that it made him vomit last time. patient was able to complete task while in standing for 2 mins with RW at bedside. patient reported increased discomfort in abdomen with noted increased size on this date. nursing is aware.                                    Extremity/Trunk Assessment              Vision Patient Visual Report: No change from baseline     Perception     Praxis      Cognition Arousal/Alertness: Awake/alert Behavior During Therapy: WFL for tasks assessed/performed Overall Cognitive Status: Within Functional Limits for tasks assessed                                            Exercises     Shoulder Instructions       General Comments      Pertinent Vitals/ Pain       Pain Assessment: Faces Faces Pain Scale: Hurts a little bit Pain Location: stomach Pain Descriptors / Indicators: Aching Pain Intervention(s): Monitored during session  Home Living  Prior Functioning/Environment              Frequency  Min 1X/week        Progress Toward Goals  OT Goals(current goals can now be found in the care plan section)  Progress towards OT goals: Progressing toward goals  Acute Rehab OT Goals OT Goal Formulation: With patient Time For Goal Achievement: 09/18/21 Potential to Achieve Goals: Good ADL Goals Pt Will Perform Grooming: with set-up;standing Pt Will Perform Lower Body Dressing: with min guard assist;sitting/lateral leans  Plan Discharge plan remains appropriate    Co-evaluation                 AM-PAC OT "6 Clicks" Daily Activity     Outcome Measure   Help from another person eating meals?: None Help from another person taking care of personal grooming?: None Help from another person toileting, which includes using toliet, bedpan, or urinal?: Total (catheter and iliostomy. can talk you  through steps of management) Help from another person bathing (including washing, rinsing, drying)?: A Little Help from another person to put on and taking off regular upper body clothing?: None Help from another person to put on and taking off regular lower body clothing?: A Lot 6 Click Score: 18    End of Session Equipment Utilized During Treatment: Rolling walker (2 wheels)  OT Visit Diagnosis: Pain;Muscle weakness (generalized) (M62.81)   Activity Tolerance Patient tolerated treatment well   Patient Left in bed;with call bell/phone within reach;with nursing/sitter in room   Nurse Communication Mobility status        Time: 4431-5400 OT Time Calculation (min): 25 min  Charges: OT General Charges $OT Visit: 1 Visit OT Treatments $Self Care/Home Management : 23-37 mins  Jackelyn Poling OTR/L, MS Acute Rehabilitation Department Office# [phone] Pager# [phone]   Marcellina Millin 09/09/2021, 11:21 AM

## 2021-09-09 NOTE — Progress Notes (Signed)
Physical Therapy Treatment Patient Details Name: Phillip Brooks MRN: [id number] DOB: 1956-02-17 Today's Date: 09/09/2021   History of Present Illness Patient is 65 y.o. male s/p robotic LAR, diverting ostomy on 08/13/2021 for advanced mid rectal cancer.    PT Comments    POD # 25 Assisted OOB to amb.  General Gait Details: decreased amb distance this session due to increased c/o ABD pain/bloating "not gonna get very far today" stated pt.   Recommendations for follow up therapy are one component of a multi-disciplinary discharge planning process, led by the attending physician.  Recommendations may be updated based on patient status, additional functional criteria and insurance authorization.  Follow Up Recommendations  Home health PT     Assistance Recommended at Discharge Intermittent Supervision/Assistance  Equipment Recommendations  Rolling walker (2 wheels)    Recommendations for Other Services       Precautions / Restrictions Precautions Precautions: Fall Precaution Comments: ileostomy, R JP Drain, catheter     Mobility  Bed Mobility               General bed mobility comments: Mod I for bed mobility with use of rail and increased time.    Transfers Overall transfer level: Needs assistance Equipment used: Rolling walker (2 wheels)   Sit to Stand: Modified independent (Device/Increase time)           General transfer comment: good safety cognition and use of hands to steady self    Ambulation/Gait Ambulation/Gait assistance: Supervision Gait Distance (Feet): 45 Feet Assistive device: Rolling walker (2 wheels) Gait Pattern/deviations: Step-through pattern;Decreased stride length;Trunk flexed Gait velocity: decreased     General Gait Details: decreased amb distance this session due to increased c/o ABD pain/bloating "not gonna get very far today" stated pt.   Stairs             Wheelchair Mobility    Modified Rankin (Stroke Patients  Only)       Balance                                            Cognition Arousal/Alertness: Awake/alert Behavior During Therapy: WFL for tasks assessed/performed Overall Cognitive Status: Within Functional Limits for tasks assessed                                 General Comments: AxO x 3 very motivated and progressing well with his mobility. 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        Exercises      General Comments        Pertinent Vitals/Pain Pain Assessment: Faces Faces Pain Scale: Hurts little more Pain Location: ABD increased pain and bloating today Pain Descriptors / Indicators: Aching    Home Living                          Prior Function            PT Goals (current goals can now be found in the care plan section) Progress towards PT goals: Progressing toward goals    Frequency    Min 3X/week      PT Plan Current plan remains appropriate    Co-evaluation  AM-PAC PT "6 Clicks" Mobility   Outcome Measure  Help needed turning from your back to your side while in a flat bed without using bedrails?: None Help needed moving from lying on your back to sitting on the side of a flat bed without using bedrails?: None Help needed moving to and from a bed to a chair (including a wheelchair)?: A Little Help needed standing up from a chair using your arms (e.g., wheelchair or bedside chair)?: A Little Help needed to walk in hospital room?: A Little Help needed climbing 3-5 steps with a railing? : A Little 6 Click Score: 20    End of Session Equipment Utilized During Treatment: Gait belt Activity Tolerance: No increased pain Patient left: in bed;with call bell/phone within reach;with family/visitor present Nurse Communication: Mobility status (nausea meds) PT Visit Diagnosis: Muscle weakness (generalized) (M62.81);Difficulty in walking, not elsewhere  classified (R26.2);Unsteadiness on feet (R26.81);Pain     Time: 1550-1616 PT Time Calculation (min) (ACUTE ONLY): 26 min  Charges:  $Gait Training: 8-22 mins $Therapeutic Activity: 8-22 mins                     Rica Koyanagi  PTA Acute  Rehabilitation Services Pager      [phone] Office      [phone]

## 2021-09-09 NOTE — Progress Notes (Signed)
PHARMACY - TOTAL PARENTERAL NUTRITION CONSULT NOTE   Indication: intolerance to enteral feeding  Patient Measurements: Height: 5\' 6"  (167.6 cm) Weight: 57.2 kg (126 lb 1.7 oz) IBW/kg (Calculated) : 63.8 TPN AdjBW (KG): 59.7 Body mass index is 20.35 kg/m.  Assessment:  65 yo M presents on 10/28 for eval of rectal cancer. S/P LAR and diverting ileostomy. Has been NPO for 7 days and NG tube in place with high output. Pharmacy consulted to start TPN. Stopped TPN on 11/15 but had to restart on 11/17 as patient vomited as is unable to tolerate PO.  Glucose / Insulin: No Hx DM. CBGs/SSI stopped on 11/11.  AM glucos controlled. Electrolytes: WNL, CorrCa 10.08 Renal: SCr and BUN wnl  Hepatic: Albumin remains low; LFTs WNL, TG 58 Intake / Output; MIVF:  NS 75 ml/hr per CCS No NGT,  stool 1300 / 24 hrs. UOP up 2165 ml/h24 hr. Drain has minimal output GI Imaging: 11/4 CT abdomen shows fluid collection, SBO, bladder thickening  11/17 KUB: Dilated small bowel loops are noted concerning for distal small bowel obstruction 11/19 CT:  Small-bowel obstruction, increase in size of gas and fluid abscess formation  GI Surgeries / Procedures:  10/28: Diverting ileostomy   Central access: Implanted Port TPN start date: 11/5 >> 11/15. Resumed 11/17>>  Nutritional Goals:  Goal TPN rate is 85 mL/hr (provides 102 g of protein and 1958 kcals per day)  RD Assessment: Estimated Needs Total Energy Estimated Needs: 1950-2150 Total Protein Estimated Needs: 100-110g Total Fluid Estimated Needs: 2L/day  Current Nutrition:  NPO TPN  Plan:  Continue TPN at 59mL/hr at 1800 Electrolytes in TPN: Na 100 mEq/L, K 60mEq/L, Ca 82mEq/L, Mg 50mEq/L, and Phos 1mmol/L. Cl:Ac 1:2 Add standard MVI and trace elements to TPN Continue NS at 18ml/hr per CCS TPN labs on Mon/Thurs  Gretta Arab PharmD, BCPS Clinical Pharmacist [facility] main pharmacy [phone] 09/09/2021 7:40 AM

## 2021-09-09 NOTE — Progress Notes (Signed)
25 Days Post-Op robotic LAR, diverting ostomy  Subjective/Chief Complaint:  ambulating better in hall, having some ileostomy output  Objective: Vital signs in last 24 hours: Temp:  [97.8 F (36.6 C)-99.1 F (37.3 C)] 97.8 F (36.6 C) (11/22 0441) Pulse Rate:  [86-93] 93 (11/22 0441) Resp:  [15-18] 18 (11/22 0441) BP: (115-122)/(68-74) 122/74 (11/22 0441) SpO2:  [98 %-100 %] 100 % (11/22 0441) Last BM Date: 09/08/21  Intake/Output from previous day: 11/21 0701 - 11/22 0700 In: 3770.8 [I.V.:3660.8; IV Piggyback:100.1] Out: 6213 [Urine:2165; Drains:6; YQMVH:8469] Intake/Output this shift: No intake/output data recorded.  General appearance: alert, cooperative, and no distress GI: distended, tender in lower abdomen; ostomy pink with liquid stool Incisions OK, JP drain with min cloudy drainage  Lab Results:  No results for input(s): WBC, HGB, HCT, PLT in the last 72 hours.  BMET Recent Labs    09/08/21 0413 09/09/21 0408  NA 136 138  K 3.9 4.0  CL 102 106  CO2 26 26  GLUCOSE 112* 119*  BUN 18 16  CREATININE 0.79 0.68  CALCIUM 8.9 8.8*    PT/INR No results for input(s): LABPROT, INR in the last 72 hours. ABG No results for input(s): PHART, HCO3 in the last 72 hours.  Invalid input(s): PCO2, PO2  Studies/Results: No results found.  Anti-infectives: Anti-infectives (From admission, onward)    Start     Dose/Rate Route Frequency Ordered Stop   09/08/21 1400  cefTRIAXone (ROCEPHIN) 2 g in sodium chloride 0.9 % 100 mL IVPB       Note to Pharmacy: Pharmacy may adjust dosing strength / duration / interval for maximal efficacy   2 g 200 mL/hr over 30 Minutes Intravenous Every 24 hours 09/08/21 1307 09/15/21 1359   08/26/21 1000  metroNIDAZOLE (FLAGYL) IVPB 500 mg  Status:  Discontinued        500 mg 100 mL/hr over 60 Minutes Intravenous Every 12 hours 08/26/21 0830 08/26/21 0910   08/26/21 1000  piperacillin-tazobactam (ZOSYN) IVPB 3.375 g  Status:  Discontinued         3.375 g 12.5 mL/hr over 240 Minutes Intravenous Every 8 hours 08/26/21 0910 09/01/21 0819   08/25/21 1030  metroNIDAZOLE (FLAGYL) tablet 500 mg  Status:  Discontinued        500 mg Per Tube Every 12 hours 08/25/21 0935 08/26/21 0830   08/25/21 1000  cefTRIAXone (ROCEPHIN) 2 g in sodium chloride 0.9 % 100 mL IVPB  Status:  Discontinued       Note to Pharmacy: Pharmacy may adjust dosing strength / duration / interval for maximal efficacy   2 g 200 mL/hr over 30 Minutes Intravenous Every 24 hours 08/25/21 0823 08/26/21 0910   08/25/21 1000  metroNIDAZOLE (FLAGYL) tablet 500 mg  Status:  Discontinued        500 mg Oral Every 12 hours 08/25/21 0837 08/25/21 0935   08/01/2021 2000  cefoTEtan (CEFOTAN) 2 g in sodium chloride 0.9 % 100 mL IVPB        2 g 200 mL/hr over 30 Minutes Intravenous Every 12 hours 07/29/2021 1647 07/26/2021 2046   08/14/2021 0600  cefoTEtan (CEFOTAN) 2 g in sodium chloride 0.9 % 100 mL IVPB        2 g 200 mL/hr over 30 Minutes Intravenous On call to O.R. 08/03/2021 0513 08/04/2021 0806       Assessment/Plan: s/p Procedure(s): XI ROBOTIC ASSISTED LOWER ANTERIOR RESECTION, WITH BILATERAL TAP BLOCK AND INTRAOPERATIVE ASSESSMENT USING ICG (N/A) DIVERTING ILEOSTOMY (N/A)  CYSTOSCOPY with FIREFLY INJECTION AND URETHERAL DILATION (N/A) Expected post op Ileus: resolving, advance to soft diet, cont to monitor ostomy output, NG out 11/7, AXR with continued ileus on 11/17.  NPO, NGT PRN, will repeat CT once Cr better to evaluate further Severe malnutrition, prolonged ileus: pt not eating well off TPN, Restarted TPN 11/17 High output ileostomy: will cont fiber pills but hold imodium due to ileus and vomiting. Anemia: most likely related to chronic illness and malnutrition.  Iron supplements ordered but pt did not tolerate this.  Iron levels low. Will give IV iron today possible anastomotic dehisscence vs abscess from previous perforated rectal cancer: JP drainage catheter appears to be in  appropriate position, d/c Zosyn 11/14.  wbc normal.  - Rpt CT 11/20 shows increased abscess size with drain in correct position.  Will ask RN to restart drain flushes.  No sign of systemic infection at this time.  Discussed with IR on 11/21.  No further targets available for additional drainage.  Will repeat a 7 day course of abx.   Ambulate in hall TID, appreciate PT/OT involvement  Urinary retention: foley replaced on Mon 11/1.  foley out again 11/7, failed voiding trial 11/9 with elevated PVR.   Foley replaced, will need urology f/u as outpatient Dispo: pt has a temporary housing plan in place at discharge in [place] with his Aunt.  TOC team consulted and working with housing authority to assist with long term housing.      LOS: 25 days    Phillip Brooks 60/01/5996

## 2021-09-09 NOTE — TOC Progression Note (Signed)
Transition of Care [facility]) - Progression Note    Patient Details  Name: Phillip Brooks MRN: [id number] Date of Birth: [birth year]  Transition of [facility] CM/SW Contact  Ross Ludwig, [place] Phone Number: 09/09/2021, 4:35 PM  Clinical Narrative:     CSW updated Levada Dy at [facility] that patient is still not medically ready per MD.  Per Jackquline [facility], they can still accept patient once he is medically ready for discharge.   Expected Discharge Plan: [place] Barriers to Discharge: Continued Medical Work up  Expected Discharge Plan and Services Expected Discharge Plan: [place] In-house Referral: Clinical Social Work     Living arrangements for the past 2 months: Single Family Home                 DME Arranged: N/A DME Agency: NA                   Social Determinants of Health (SDOH) Interventions    Readmission Risk Interventions No flowsheet data found.

## 2021-09-09 NOTE — Consult Note (Signed)
[place] Nurse ostomy follow up Patient receiving care in [facility] Stoma type/location: RUQ ileostomy Stomal assessment/size: pale pink, moist, 1 1/8 inches round Peristomal assessment: intact Treatment options for stomal/peristomal skin: barrier ring Output: thin green effluent Ostomy pouching: 2pc. 2 1/4 inch system, flat. Pouch Kellie Simmering #[phone]; skin barrier Kellie Simmering [phone]; barrier ring, Kellie Simmering [phone] Education provided: Patient told me what to do to change his pouching system, and how to empty the existing pouch. At this time his abdomen is distended and he cannot see the stoma.  But, he knows exactly what to do and how to do it. Enrolled patient in [place] Discharge program: Yes, previously.  Because the patient is knowledgeable on ostomy self care activities, I am reducing the number of contacts with the patient by the [employer] team to weekly.  Val Riles, RN, MSN, CWOCN, CNS-BC, pager [phone]

## 2021-09-09 NOTE — Progress Notes (Signed)
Pt tolerated Test Dose of Iron w/out reaction of any kind.

## 2021-09-10 ENCOUNTER — Encounter: Payer: Self-pay | Admitting: Oncology

## 2021-09-10 MED ORDER — TRAVASOL 10 % IV SOLN
INTRAVENOUS | Status: AC
  Filled 2021-09-10: qty 1020

## 2021-09-10 NOTE — Progress Notes (Signed)
PHARMACY - TOTAL PARENTERAL NUTRITION CONSULT NOTE   Indication: intolerance to enteral feeding  Patient Measurements: Height: 5\' 6"  (167.6 cm) Weight: 57.2 kg (126 lb 1.7 oz) IBW/kg (Calculated) : 63.8 TPN AdjBW (KG): 59.7 Body mass index is 20.35 kg/m.  Assessment:  65 yo M presents on 10/28 for eval of rectal cancer. S/P LAR and diverting ileostomy. Has been NPO for 7 days and NG tube in place with high output. Pharmacy consulted to start TPN. Stopped TPN on 11/15 but had to restart on 11/17 as patient vomited as is unable to tolerate PO.  Glucose / Insulin: No Hx DM. CBGs/SSI stopped on 11/11.  AM glucos controlled. Electrolytes: WNL, CorrCa 10.08 Renal: SCr and BUN wnl  Hepatic: Albumin remains low; LFTs WNL, TG 58 Intake / Output; MIVF:  NS 75 ml/hr per CCS No NGT,  stool 1300 / 24 hrs. UOP up 2.3L/h24 hr. Drain has minimal output GI Imaging: 11/4 CT abdomen shows fluid collection, SBO, bladder thickening  11/17 KUB: Dilated small bowel loops are noted concerning for distal small bowel obstruction 11/19 CT:  Small-bowel obstruction, increase in size of gas and fluid abscess formation  GI Surgeries / Procedures:  10/28: Diverting ileostomy   Central access: Implanted Port TPN start date: 11/5 >> 11/15. Resumed 11/17>>  Nutritional Goals:  Goal TPN rate is 85 mL/hr (provides 102 g of protein and 1958 kcals per day)  RD Assessment: Estimated Needs Total Energy Estimated Needs: 1950-2150 Total Protein Estimated Needs: 100-110g Total Fluid Estimated Needs: 2L/day  Current Nutrition:  NPO TPN  Plan:  Continue TPN at 31mL/hr at 1800 Electrolytes in TPN: Na 100 mEq/L, K 76mEq/L, Ca 80mEq/L, Mg 26mEq/L, and Phos 50mmol/L. Cl:Ac 1:2 Add standard MVI and trace elements to TPN Continue NS at 75ml/hr per CCS TPN labs on Mon/Thurs  Minda Ditto PharmD [facility] [phone] 09/10/2021, 7:13 AM

## 2021-09-10 NOTE — Progress Notes (Signed)
Nutrition Follow-up  DOCUMENTATION CODES:  Severe malnutrition in context of chronic illness  INTERVENTION:  Recommend continuing TPN per Pharmacy until pt consistently consumes >50% of a soft diet.  Advance diet as tolerated and as medically able.  NUTRITION DIAGNOSIS:  Severe Malnutrition related to chronic illness, cancer and cancer related treatments as evidenced by severe fat depletion, severe muscle depletion. - ongoing  GOAL:  Patient will meet greater than or equal to 90% of their needs. - met with TPN  MONITOR:  Diet advancement, Labs, Weight trends, Skin, I & O's, Other (Comment) (TPN)  REASON FOR ASSESSMENT:  Consult New TPN/TNA, Diet education  ASSESSMENT:  65 year old male who presented on 10/28 for surgery. PMH of stage III rectal cancer s/p chemotherapy and radiation. 10/28 - s/p cystoscopy with balloon dilation of bulbar urethral stricture and bilateral ureteral catheterization, lower anterior colon resection with diverting ileostomy; clear liquids 10/29 - NPO 10/30 - full liquids 10/31 - NPO 11/2 - NGT placed with significant output 11/4 - CT abdomen showing fluid collection, SBO, bladder thickening 11/5 - TPN started 11/7 - NGT removed 11/9 - CLD 11/10 - FLD 11/11 - Soft diet  11/15 - TPN stopped 11/17 - emesis x2, TPN restarted  Pt continues to be on TPN while NPO.  Per Pharmacy note, TPN at 43m/hr at 1800 Electrolytes in TPN: Na 100 mEq/L, K 431m/L, Ca 46m51mL, Mg 46mE846m, and Phos 146mm61m. Cl:Ac 1:2 Add standard MVI and trace elements to TPN   Admit wt: 59.7 kg Current wt: 57.2 kg  Continue TPN until patient is tolerating >50% of soft diet consistently.  [facility] per MD.  Per [facility] can still accept patient once he is medically ready for discharge.  Medications: reviewed; NaCl @ 75 ml/hr, Zofran per IV PRN (given once today)  Labs: reviewed; Glucose 119 (H)  Diet Order:    Diet Order             Diet NPO time specified Except for: Sips with Meds  Diet effective now                  EDUCATION NEEDS:  Education needs have been addressed  Skin:  Skin Assessment: Skin Integrity Issues: Skin Integrity Issues:: Incisions Incisions: abdomen  Last BM:  09/10/21 - ileostomy  Height:  Ht Readings from Last 1 Encounters:  07/26/2021 '5\' 6"'  (1.676 m)   Weight:  Wt Readings from Last 1 Encounters:  09/07/21 57.2 kg   BMI:  Body mass index is 20.35 kg/m.  Estimated Nutritional Needs:  Kcal:  1950-2150 Protein:  100-110g Fluid:  2L/day  MeganDerrel Nip LDN (she/her/hers) Clinical Inpatient Dietitian RD Pager/After-Hours/Weekend Pager # [place]

## 2021-09-10 NOTE — Progress Notes (Signed)
26 Days Post-Op robotic LAR, diverting ostomy  Subjective/Chief Complaint:  ambulating some in hall, having some ileostomy output, having lower abd pain  Objective: Vital signs in last 24 hours: Temp:  [98.2 F (36.8 C)-98.9 F (37.2 C)] 98.9 F (37.2 C) (11/23 0507) Pulse Rate:  [87-88] 88 (11/23 0507) Resp:  [14-18] 18 (11/23 0507) BP: (101-114)/(69-70) 114/69 (11/23 0507) SpO2:  [97 %-99 %] 97 % (11/23 0507) Last BM Date: 09/10/21  Intake/Output from previous day: 11/22 0701 - 11/23 0700 In: 3885.3 [P.O.:180; I.V.:3345.3; IV Piggyback:360] Out: 3200 [Urine:2275; Stool:925] Intake/Output this shift: Total I/O In: -  Out: 500 [Urine:250; Stool:250]  General appearance: alert, cooperative, and no distress GI: distended, tender in lower abdomen; ostomy pink with liquid stool Incisions OK, JP drain with min purulent drainage  Lab Results:  No results for input(s): WBC, HGB, HCT, PLT in the last 72 hours.  BMET Recent Labs    09/08/21 0413 09/09/21 0408  NA 136 138  K 3.9 4.0  CL 102 106  CO2 26 26  GLUCOSE 112* 119*  BUN 18 16  CREATININE 0.79 0.68  CALCIUM 8.9 8.8*    PT/INR No results for input(s): LABPROT, INR in the last 72 hours. ABG No results for input(s): PHART, HCO3 in the last 72 hours.  Invalid input(s): PCO2, PO2  Studies/Results: No results found.  Anti-infectives: Anti-infectives (From admission, onward)    Start     Dose/Rate Route Frequency Ordered Stop   09/08/21 1400  cefTRIAXone (ROCEPHIN) 2 g in sodium chloride 0.9 % 100 mL IVPB       Note to Pharmacy: Pharmacy may adjust dosing strength / duration / interval for maximal efficacy   2 g 200 mL/hr over 30 Minutes Intravenous Every 24 hours 09/08/21 1307 09/15/21 1359   08/26/21 1000  metroNIDAZOLE (FLAGYL) IVPB 500 mg  Status:  Discontinued        500 mg 100 mL/hr over 60 Minutes Intravenous Every 12 hours 08/26/21 0830 08/26/21 0910   08/26/21 1000  piperacillin-tazobactam (ZOSYN)  IVPB 3.375 g  Status:  Discontinued        3.375 g 12.5 mL/hr over 240 Minutes Intravenous Every 8 hours 08/26/21 0910 09/01/21 0819   08/25/21 1030  metroNIDAZOLE (FLAGYL) tablet 500 mg  Status:  Discontinued        500 mg Per Tube Every 12 hours 08/25/21 0935 08/26/21 0830   08/25/21 1000  cefTRIAXone (ROCEPHIN) 2 g in sodium chloride 0.9 % 100 mL IVPB  Status:  Discontinued       Note to Pharmacy: Pharmacy may adjust dosing strength / duration / interval for maximal efficacy   2 g 200 mL/hr over 30 Minutes Intravenous Every 24 hours 08/25/21 0823 08/26/21 0910   08/25/21 1000  metroNIDAZOLE (FLAGYL) tablet 500 mg  Status:  Discontinued        500 mg Oral Every 12 hours 08/25/21 0837 08/25/21 0935   07/31/2021 2000  cefoTEtan (CEFOTAN) 2 g in sodium chloride 0.9 % 100 mL IVPB        2 g 200 mL/hr over 30 Minutes Intravenous Every 12 hours 07/30/2021 1647 08/14/2021 2046   07/26/2021 0600  cefoTEtan (CEFOTAN) 2 g in sodium chloride 0.9 % 100 mL IVPB        2 g 200 mL/hr over 30 Minutes Intravenous On call to O.R. 07/27/2021 0513 07/25/2021 0806       Assessment/Plan: s/p Procedure(s): XI ROBOTIC ASSISTED LOWER ANTERIOR RESECTION, WITH BILATERAL TAP BLOCK  AND INTRAOPERATIVE ASSESSMENT USING ICG (N/A) DIVERTING ILEOSTOMY (N/A) CYSTOSCOPY with FIREFLY INJECTION AND URETHERAL DILATION (N/A) post op Ileus due to pelvic abscess: NG out 11/7, AXR with continued ileus on 11/17.  NPO, NGT PRN- pt currently refusing replacement Severe malnutrition, prolonged ileus: pt not eating well off TPN, Restarted TPN 11/17 High output ileostomy: will cont fiber pills but hold imodium due to ileus and vomiting. Anemia: most likely related to chronic illness and malnutrition.  Iron supplements ordered but pt did not tolerate this.  Iron levels low. IV iron given 11/22 possible anastomotic dehisscence vs abscess from previous perforated rectal cancer: JP drainage catheter appears to be in appropriate position, Zosyn  11/7-11/14.  wbc normal.  - Rpt CT 11/20 shows increased abscess size with JP drain in correct position.  restarted drain flushes.  No sign of systemic infection at this time.  Discussed with IR on 11/21.  No further targets available for additional drainage.  Will repeat a 7 day course of abx.   Ambulate in hall TID, appreciate PT/OT involvement  Urinary retention: foley replaced on Mon 11/1.  foley out again 11/7, failed voiding trial 11/9 with elevated PVR.   Foley replaced, will need urology f/u as outpatient Dispo: Homeless.  pt has a temporary housing plan in place at discharge in [place] with his Aunt.  TOC team consulted and working with housing authority to assist with long term housing.      LOS: 26 days    Phillip Brooks [id number]

## 2021-09-10 NOTE — Progress Notes (Signed)
   09/10/21 1400  Mobility  Activity Refused mobility   Pt refused secondary to nausea and stomach pain.   [place] Specialist Acute Rehab Services Office: [phone]

## 2021-09-11 LAB — COMPREHENSIVE METABOLIC PANEL
ALT: 10 U/L (ref 0–44)
AST: 11 U/L — ABNORMAL LOW (ref 15–41)
Albumin: 2.3 g/dL — ABNORMAL LOW (ref 3.5–5.0)
Alkaline Phosphatase: 44 U/L (ref 38–126)
Anion gap: 6 (ref 5–15)
BUN: 15 mg/dL (ref 8–23)
CO2: 26 mmol/L (ref 22–32)
Calcium: 8.8 mg/dL — ABNORMAL LOW (ref 8.9–10.3)
Chloride: 106 mmol/L (ref 98–111)
Creatinine, Ser: 0.75 mg/dL (ref 0.61–1.24)
GFR, Estimated: >60 mL/min (ref >60)
Glucose, Bld: 118 mg/dL — ABNORMAL HIGH (ref 70–99)
Potassium: 3.7 mmol/L (ref 3.5–5.1)
Sodium: 138 mmol/L (ref 135–145)
Total Bilirubin: 0.2 mg/dL — ABNORMAL LOW (ref 0.3–1.2)
Total Protein: 6.8 g/dL (ref 6.5–8.1)

## 2021-09-11 LAB — PHOSPHORUS: Phosphorus: 4.2 mg/dL (ref 2.5–4.6)

## 2021-09-11 LAB — MAGNESIUM: Magnesium: 2.1 mg/dL (ref 1.7–2.4)

## 2021-09-11 MED ORDER — POTASSIUM CHLORIDE 10 MEQ/100ML IV SOLN
10.0000 meq | INTRAVENOUS | Status: AC
  Administered 2021-09-11 (×3): 10 meq via INTRAVENOUS
  Filled 2021-09-11 (×3): qty 100

## 2021-09-11 MED ORDER — TRAVASOL 10 % IV SOLN
INTRAVENOUS | Status: AC
  Filled 2021-09-11: qty 1020

## 2021-09-11 NOTE — Progress Notes (Signed)
PHARMACY - TOTAL PARENTERAL NUTRITION CONSULT NOTE   Indication: intolerance to enteral feeding  Patient Measurements: Height: 5\' 6"  (167.6 cm) Weight: 57.2 kg (126 lb 1.7 oz) IBW/kg (Calculated) : 63.8 TPN AdjBW (KG): 59.7 Body mass index is 20.35 kg/m.  Assessment:  65 yo M presents on 10/28 for eval of rectal cancer. S/P LAR and diverting ileostomy. Has been NPO for 7 days and NG tube in place with high output. Pharmacy consulted to start TPN. Stopped TPN on 11/15 but had to restart on 11/17 as patient vomited as is unable to tolerate PO.  Glucose / Insulin: No Hx DM. CBGs/SSI stopped on 11/11.  AM glucose controlled. Electrolytes: K down to 3.7 (keep at least 4). Phos 4.2, trending up. Others WNL.  Renal: SCr and BUN WNL Hepatic: Albumin remains low; LFTs WNL, Tbili low, TG 58 (11/21) Intake / Output; MIVF:  NS 75 ml/hr per CCS No NGT, Stool 1975 mL/24 hrs. UOP up 2450 mL/24 hrs. Drain has minimal output GI Imaging: 11/4 CT abdomen shows fluid collection, SBO, bladder thickening  11/17 KUB: Dilated small bowel loops are noted concerning for distal small bowel obstruction 11/19 CT:  Small-bowel obstruction, increase in size of gas and fluid abscess formation  GI Surgeries / Procedures:  10/28: Diverting ileostomy   Central access: Implanted Port TPN start date: 11/5 >> 11/15. Resumed 11/17>>  Nutritional Goals:  Goal TPN rate is 85 mL/hr (provides 102 g of protein and 1958 kcals per day)  RD Assessment: Estimated Needs Total Energy Estimated Needs: 1950-2150 Total Protein Estimated Needs: 100-110g Total Fluid Estimated Needs: 2L/day  Current Nutrition:  NPO TPN  Plan:  KCl 44mEq IV x 3 runs now  Continue TPN at 86mL/hr at 1800 Electrolytes in TPN: Na 100 mEq/L, K 69mEq/L, Ca 62mEq/L, Mg 61mEq/L, and Phos 26mmol/L. Cl:Ac 1:2 Add standard MVI and trace elements to TPN Continue NS at 7ml/hr per CCS TPN labs on Mon/Thurs CMET, Mag, Phos in AM   Lindell Spar,  PharmD, BCPS [facility] [phone] 09/11/2021, 11:15 AM

## 2021-09-11 NOTE — Progress Notes (Signed)
Assessment & Plan: POD#27 - status post XI ROBOTIC ASSISTED LOWER ANTERIOR RESECTION, DIVERTING ILEOSTOMY  post op Ileus due to pelvic abscess NPO, TNA Severe malnutrition, restarted TPN 11/17 High output ileostomy: will cont fiber pills but hold imodium due to ileus and vomiting. Anemia: most likely related to chronic illness and malnutrition.  IV iron given 11/22 possible anastomotic dehisscence vs abscess from previous perforated rectal cancer: JP drainage catheter appears to be in appropriate position, Zosyn 11/7-11/14.  wbc normal.  CT 11/20 shows increased abscess size with JP drain in correct position; restarted drain flushes. Discussed with IR on 11/21.  No further targets available for additional drainage.  Will repeat a 7 day course of abx.   Ambulate in hall TID, appreciate PT/OT involvement  Urinary retention: foley replaced on Mon 11/1.  foley out again 11/7, failed voiding trial 11/9 with elevated PVR.   Foley replaced, will need urology f/u as outpatient Dispo: Homeless. Temporary housing plan in place at discharge in [place] with his Aunt.  TOC team consulted and working with housing authority to assist with long term housing.           [name], MD       [facility]       Office: [phone]   Chief Complaint: Rectal cancer, SBO  Subjective: Patient in bed, nurse at bedside.  Pleasant, no complaints.  Limited ambulation in room.  Objective: Vital signs in last 24 hours: Temp:  [97.6 F (36.4 C)-98.7 F (37.1 C)] 98.7 F (37.1 C) (11/24 0538) Pulse Rate:  [88-99] 88 (11/24 0538) Resp:  [15-18] 18 (11/24 0538) BP: (106-121)/(67-77) 116/72 (11/24 0538) SpO2:  [98 %-100 %] 99 % (11/24 0538) Last BM Date: 09/10/21  Intake/Output from previous day: 11/23 0701 - 11/24 0700 In: 3051.5 [I.V.:2951.5; IV Piggyback:100] Out: 8295 [Urine:2450; Drains:5; AOZHY:8657] Intake/Output this shift: No intake/output data recorded.  Physical  Exam: HEENT - sclerae clear, mucous membranes moist Neck - soft Abdomen - protuberant; JP with small output; ostomy viable with moderate thin liquid output Neuro - alert & oriented, no focal deficits  Lab Results:  No results for input(s): WBC, HGB, HCT, PLT in the last 72 hours. BMET Recent Labs    09/09/21 0408 09/11/21 0424  NA 138 138  K 4.0 3.7  CL 106 106  CO2 26 26  GLUCOSE 119* 118*  BUN 16 15  CREATININE 0.68 0.75  CALCIUM 8.8* 8.8*   PT/INR No results for input(s): LABPROT, INR in the last 72 hours. Comprehensive Metabolic Panel:    Component Value Date/Time   NA 138 09/11/2021 0424   NA 138 09/09/2021 0408   K 3.7 09/11/2021 0424   K 4.0 09/09/2021 0408   CL 106 09/11/2021 0424   CL 106 09/09/2021 0408   CO2 26 09/11/2021 0424   CO2 26 09/09/2021 0408   BUN 15 09/11/2021 0424   BUN 16 09/09/2021 0408   CREATININE 0.75 09/11/2021 0424   CREATININE 0.68 09/09/2021 0408   CREATININE 0.79 06/09/2021 1020   CREATININE 0.86 05/07/2021 0826   GLUCOSE 118 (H) 09/11/2021 0424   GLUCOSE 119 (H) 09/09/2021 0408   CALCIUM 8.8 (L) 09/11/2021 0424   CALCIUM 8.8 (L) 09/09/2021 0408   AST 11 (L) 09/11/2021 0424   AST 13 (L) 09/08/2021 0413   AST 11 (L) 06/09/2021 1020   AST 16 05/07/2021 0826   ALT 10 09/11/2021 0424   ALT 12 09/08/2021 0413   ALT 5  06/09/2021 1020   ALT 7 05/07/2021 0826   ALKPHOS 44 09/11/2021 0424   ALKPHOS 49 09/08/2021 0413   BILITOT 0.2 (L) 09/11/2021 0424   BILITOT 0.3 09/08/2021 0413   BILITOT 0.3 06/09/2021 1020   BILITOT 0.3 05/07/2021 0826   PROT 6.8 09/11/2021 0424   PROT 6.9 09/08/2021 0413   ALBUMIN 2.3 (L) 09/11/2021 0424   ALBUMIN 2.4 (L) 09/08/2021 0413    Studies/Results: No results found.    [name] 09/11/2021   Patient ID: Phillip Brooks, male   DOB: [birth year], 65 y.o.   MRN: [id number]

## 2021-09-12 ENCOUNTER — Other Ambulatory Visit (HOSPITAL_COMMUNITY): Payer: Self-pay

## 2021-09-12 LAB — PHOSPHORUS: Phosphorus: 4.1 mg/dL (ref 2.5–4.6)

## 2021-09-12 LAB — COMPREHENSIVE METABOLIC PANEL
ALT: 11 U/L (ref 0–44)
AST: 15 U/L (ref 15–41)
Albumin: 2.3 g/dL — ABNORMAL LOW (ref 3.5–5.0)
Alkaline Phosphatase: 44 U/L (ref 38–126)
Anion gap: 8 (ref 5–15)
BUN: 15 mg/dL (ref 8–23)
CO2: 25 mmol/L (ref 22–32)
Calcium: 9 mg/dL (ref 8.9–10.3)
Chloride: 106 mmol/L (ref 98–111)
Creatinine, Ser: 0.67 mg/dL (ref 0.61–1.24)
GFR, Estimated: >60 mL/min (ref >60)
Glucose, Bld: 114 mg/dL — ABNORMAL HIGH (ref 70–99)
Potassium: 4.2 mmol/L (ref 3.5–5.1)
Sodium: 139 mmol/L (ref 135–145)
Total Bilirubin: 0.3 mg/dL (ref 0.3–1.2)
Total Protein: 7 g/dL (ref 6.5–8.1)

## 2021-09-12 LAB — MAGNESIUM: Magnesium: 2 mg/dL (ref 1.7–2.4)

## 2021-09-12 MED ORDER — TRAVASOL 10 % IV SOLN
INTRAVENOUS | Status: AC
  Filled 2021-09-12: qty 1020

## 2021-09-12 NOTE — Progress Notes (Signed)
PHARMACY - TOTAL PARENTERAL NUTRITION CONSULT NOTE   Indication: intolerance to enteral feeding  Patient Measurements: Height: 5\' 6"  (167.6 cm) Weight: 59.2 kg (130 lb 8.2 oz) IBW/kg (Calculated) : 63.8 TPN AdjBW (KG): 59.7 Body mass index is 21.07 kg/m.  Assessment:  65 yo M presents on 10/28 for eval of rectal cancer. S/P LAR and diverting ileostomy. Has been NPO for 7 days and NG tube in place with high output. Pharmacy consulted to start TPN. Stopped TPN on 11/15 but had to restart on 11/17 as patient vomited as is unable to tolerate PO.  Glucose / Insulin: No Hx DM. CBGs/SSI stopped on 11/11.  AM glucose controlled. Electrolytes: K up to 4.2 after repletion. Corrected Calcium trending up, 10.36. Others WNL.   Renal: SCr and BUN WNL Hepatic: Albumin remains low; LFTs and Tbili WNL, TG 58 (11/21) Intake / Output; MIVF:  NS 75 ml/hr per CCS No NGT, Stool 1350 mL/24 hrs. UOP up 2900 mL/24 hrs. Drain has minimal output GI Imaging: 11/4 CT abdomen shows fluid collection, SBO, bladder thickening  11/17 KUB: Dilated small bowel loops are noted concerning for distal small bowel obstruction 11/19 CT:  Small-bowel obstruction, increase in size of gas and fluid abscess formation  GI Surgeries / Procedures:  10/28: Diverting ileostomy   Central access: Implanted Port TPN start date: 11/5 >> 11/15. Resumed 11/17>>  Nutritional Goals:  Goal TPN rate is 85 mL/hr (provides 102 g of protein and 1958 kcals per day)  RD Assessment: Estimated Needs Total Energy Estimated Needs: 1950-2150 Total Protein Estimated Needs: 100-110g Total Fluid Estimated Needs: 2L/day  Current Nutrition:  NPO > clear liquid diet today  TPN  Plan:  Continue TPN at 59mL/hr at 1800 Electrolytes in TPN: Na 100 mEq/L, K 42mEq/L, Ca 30mEq/L, Mg 83mEq/L, and Phos 15mmol/L. Cl:Ac 1:2 Add standard MVI and trace elements to TPN Continue NS at 42ml/hr per CCS TPN labs on Mon/Thurs CMET, Mag, Phos in AM   Lindell Spar, PharmD, BCPS [facility] [phone] 09/12/2021, 7:20 AM

## 2021-09-12 NOTE — Progress Notes (Signed)
Assessment & Plan: POD#29 - status post XI ROBOTIC ASSISTED LOWER ANTERIOR RESECTION, DIVERTING ILEOSTOMY  post op Ileus, pelvic abscess Continue TNA Increased ileostomy output - will allow limited clear liquid diet today Anemia: most likely related to chronic illness and malnutrition.  IV iron given 11/22 Possible anastomotic dehisscence vs abscess from previous perforated rectal cancer: JP drainage catheter appears to be in appropriate position CT 11/20 shows increased abscess size with JP drain in correct position; restarted drain flushes. Discussed with IR on 11/21.  No further targets available for additional drainage.  Will repeat a 7 day course of abx  Ambulate in hall TID, appreciate PT/OT involvement  Urinary retention: Foley replaced, will need urology f/u as outpatient Dispo: Homeless. Temporary housing plan in place at discharge in [place] with his Aunt.  TOC team consulted and working with housing authority to assist with long term housing.            [name], MD       [facility]       Office: [phone]   Chief Complaint: Rectal carcinoma  Subjective: Patient in bed, comfortable.  Notes increase ileostomy output.  Denies nausea, emesis.  Objective: Vital signs in last 24 hours: Temp:  [98.2 F (36.8 C)-98.6 F (37 C)] 98.6 F (37 C) (11/25 0630) Pulse Rate:  [81-88] 83 (11/25 0630) Resp:  [14-16] 16 (11/25 0630) BP: (100-112)/(66-71) 108/71 (11/25 0630) SpO2:  [100 %] 100 % (11/25 0630) Weight:  [59.2 kg] 59.2 kg (11/25 0630) Last BM Date: 09/11/21  Intake/Output from previous day: 11/24 0701 - 11/25 0700 In: 3953.9 [I.V.:3609.4; IV Piggyback:344.5] Out: 4255 [Urine:2900; Drains:5; IZTIW:5809] Intake/Output this shift: Total I/O In: 0  Out: 150 [Stool:150]  Physical Exam: HEENT - sclerae clear, mucous membranes moist Neck - soft Abdomen - protuberant, tense; BS present; thin bilious in ostomy bag, ostomy viable; cloudy  fluid in JP with particulate matter Neuro - alert & oriented, no focal deficits  Lab Results:  No results for input(s): WBC, HGB, HCT, PLT in the last 72 hours. BMET Recent Labs    09/11/21 0424 09/12/21 0359  NA 138 139  K 3.7 4.2  CL 106 106  CO2 26 25  GLUCOSE 118* 114*  BUN 15 15  CREATININE 0.75 0.67  CALCIUM 8.8* 9.0   PT/INR No results for input(s): LABPROT, INR in the last 72 hours. Comprehensive Metabolic Panel:    Component Value Date/Time   NA 139 09/12/2021 0359   NA 138 09/11/2021 0424   K 4.2 09/12/2021 0359   K 3.7 09/11/2021 0424   CL 106 09/12/2021 0359   CL 106 09/11/2021 0424   CO2 25 09/12/2021 0359   CO2 26 09/11/2021 0424   BUN 15 09/12/2021 0359   BUN 15 09/11/2021 0424   CREATININE 0.67 09/12/2021 0359   CREATININE 0.75 09/11/2021 0424   CREATININE 0.79 06/09/2021 1020   CREATININE 0.86 05/07/2021 0826   GLUCOSE 114 (H) 09/12/2021 0359   GLUCOSE 118 (H) 09/11/2021 0424   CALCIUM 9.0 09/12/2021 0359   CALCIUM 8.8 (L) 09/11/2021 0424   AST 15 09/12/2021 0359   AST 11 (L) 09/11/2021 0424   AST 11 (L) 06/09/2021 1020   AST 16 05/07/2021 0826   ALT 11 09/12/2021 0359   ALT 10 09/11/2021 0424   ALT 5 06/09/2021 1020   ALT 7 05/07/2021 0826   ALKPHOS 44 09/12/2021 0359   ALKPHOS 44 09/11/2021 0424   BILITOT 0.3  09/12/2021 0359   BILITOT 0.2 (L) 09/11/2021 0424   BILITOT 0.3 06/09/2021 1020   BILITOT 0.3 05/07/2021 0826   PROT 7.0 09/12/2021 0359   PROT 6.8 09/11/2021 0424   ALBUMIN 2.3 (L) 09/12/2021 0359   ALBUMIN 2.3 (L) 09/11/2021 0424    Studies/Results: No results found.    [name] 09/12/2021   Patient ID: Phillip Brooks, male   DOB: [birth year], 65 y.o.   MRN: [id number]

## 2021-09-13 LAB — COMPREHENSIVE METABOLIC PANEL
ALT: 10 U/L (ref 0–44)
AST: 13 U/L — ABNORMAL LOW (ref 15–41)
Albumin: 2.4 g/dL — ABNORMAL LOW (ref 3.5–5.0)
Alkaline Phosphatase: 46 U/L (ref 38–126)
Anion gap: 8 (ref 5–15)
BUN: 18 mg/dL (ref 8–23)
CO2: 27 mmol/L (ref 22–32)
Calcium: 9.1 mg/dL (ref 8.9–10.3)
Chloride: 100 mmol/L (ref 98–111)
Creatinine, Ser: 0.72 mg/dL (ref 0.61–1.24)
GFR, Estimated: >60 mL/min (ref >60)
Glucose, Bld: 105 mg/dL — ABNORMAL HIGH (ref 70–99)
Potassium: 4.1 mmol/L (ref 3.5–5.1)
Sodium: 135 mmol/L (ref 135–145)
Total Bilirubin: 0.2 mg/dL — ABNORMAL LOW (ref 0.3–1.2)
Total Protein: 7.4 g/dL (ref 6.5–8.1)

## 2021-09-13 LAB — PHOSPHORUS: Phosphorus: 4.2 mg/dL (ref 2.5–4.6)

## 2021-09-13 LAB — MAGNESIUM: Magnesium: 2.1 mg/dL (ref 1.7–2.4)

## 2021-09-13 MED ORDER — TRAVASOL 10 % IV SOLN
INTRAVENOUS | Status: AC
  Filled 2021-09-13: qty 1020

## 2021-09-13 NOTE — Progress Notes (Addendum)
Pt very resistant to all care today. Pt cont to call for IV pain meds (Dilaudid) every 3 hrs, states "it helps me relax." Instructed that he should not use IV Dilaudid for relaxation, offered to reach out to MD for an appropriate med and pt declined. Pt very upset about getting up to chair, offers many excuses as to why he should stay in the bed. Had to repeatedly push/encourage pt that it is MD orders that he get OOB, be up in the chair and walk the halls. Pt argumentative w this nurse, insisting that he is "too weak" to be walking the halls. Pt finally did walk halls X 2 w maximum encouragement. Pt also requesting this nurse empty his ostomy bag, encouraged pt to be as independent as he possibly can, supplies given to pt and he emptied his own bag w good technique. Will cont to monitor and encourage pt.

## 2021-09-13 NOTE — Progress Notes (Signed)
Physical Therapy Treatment Patient Details Name: Phillip Brooks MRN: [id number] DOB: 10-06-1956 Today's Date: 09/13/2021   History of Present Illness Patient is 65 y.o. male s/p robotic LAR, diverting ostomy on 08/05/2021 for advanced mid rectal cancer.    PT Comments    General Comments: AxO x 3 very pleasant but not fully aware of implications of excessive use of pain/nausea meds along with immobility has on his recovery.  Pt mentally ""dependent" on his IV meds and nutrition.  Any attempt to educate and/or instruct pt on "moving more" and "meds less" results in a confrontation. Time spent in room was 50 min. General bed mobility comments: can self perform but today it took him 18 min to complete due to his "mental state" and increased c/o nausea.  Assisted with emptying his ostomy while seated EOB.  Pt required MAX encouragement/conversation to get OOB to recliner.General Gait Details: pt did not amb this session due to increased c/o nausea and pain as well as increased frusration.  "No one know mtybody and how I feel".  "I just need to get mentally prepared" .  "I can't just jump up". Pt did not amb and only transferred to recliner.  Pt not pleased he "had to" sit in recliner.    Recommendations for follow up therapy are one component of a multi-disciplinary discharge planning process, led by the attending physician.  Recommendations may be updated based on patient status, additional functional criteria and insurance authorization.  Follow Up Recommendations  Home health PT     Assistance Recommended at Discharge Intermittent Supervision/Assistance  Equipment Recommendations  Rolling walker (2 wheels)    Recommendations for Other Services       Precautions / Restrictions Precautions Precaution Comments: ileostomy, R JP Drain, catheter     Mobility  Bed Mobility Overal bed mobility: Modified Independent             General bed mobility comments: can self perform but today it  took him 18 min to complete due to his "mental state" and increased c/o nausea.  Assisted with emptying his ostomy while seated EOB.  Pt required MAX encouragement/conversation to get OOB to recliner.    Transfers Overall transfer level: Needs assistance Equipment used: None Transfers: Bed to chair/wheelchair/BSC Sit to Stand: Supervision           General transfer comment: good safety cognition and use of hands to steady self.  Therapist assist with lines/foley.    Ambulation/Gait         Gait velocity: decreased     General Gait Details: pt did not amb this session due to increased c/o nausea and pain as well as increased frusration.  "No one know mt body and how I feel".  "I just need to get mentally prepared" .  "I can't just jump up".   Stairs             Wheelchair Mobility    Modified Rankin (Stroke Patients Only)       Balance                                            Cognition Arousal/Alertness: Awake/alert Behavior During Therapy: WFL for tasks assessed/performed Overall Cognitive Status: Within Functional Limits for tasks assessed  General Comments: AxO x 3 very pleasant but not fully aware of implications of excessive use of pain/nausea meds along with immobility has on his recovery.  Pt mentally ""dependent" on his IV meds and nutrition.  Any attempt to educate and/or instruct pt on "moving more" and "meds less" results in a confrontation.        Exercises      General Comments        Pertinent Vitals/Pain Pain Score: 8  Pain Location: ABD increased pain and nausea with activity today Pain Descriptors / Indicators: Grimacing;Tender Pain Intervention(s): Monitored during session;Repositioned;Patient requesting pain meds-RN notified    Home Living                          Prior Function            PT Goals (current goals can now be found in the care plan  section)      Frequency    Min 3X/week      PT Plan Current plan remains appropriate    Co-evaluation              AM-PAC PT "6 Clicks" Mobility   Outcome Measure  Help needed turning from your back to your side while in a flat bed without using bedrails?: None Help needed moving from lying on your back to sitting on the side of a flat bed without using bedrails?: None Help needed moving to and from a bed to a chair (including a wheelchair)?: A Little Help needed standing up from a chair using your arms (e.g., wheelchair or bedside chair)?: A Little Help needed to walk in hospital room?: A Little Help needed climbing 3-5 steps with a railing? : A Little 6 Click Score: 20    End of Session Equipment Utilized During Treatment: Gait belt Activity Tolerance: Other (comment) Patient left: in chair;with call bell/phone within reach;with chair alarm set Nurse Communication: Mobility status PT Visit Diagnosis: Muscle weakness (generalized) (M62.81);Difficulty in walking, not elsewhere classified (R26.2);Unsteadiness on feet (R26.81);Pain     Time: 1122-1212 PT Time Calculation (min) (ACUTE ONLY): 50 min  Charges:  $Therapeutic Activity: 38-52 mins                     {Dilan Novosad  PTA Acute  Rehabilitation Services Pager      [phone] Office      [phone]

## 2021-09-13 NOTE — Progress Notes (Signed)
Assessment & Plan: POD#30 - status post XI ROBOTIC ASSISTED LOWER ANTERIOR RESECTION, DIVERTING ILEOSTOMY  post op Ileus, pelvic abscess Continue TNA Ileostomy output stable - continue limited clear liquid diet today Anemia, IV iron given 11/22 Possible anastomotic dehisscence vs abscess from previous perforated rectal cancer: JP drainage catheter appears to be in appropriate position CT 11/20 shows increased abscess size with JP drain in correct position; restarted drain flushes. Discussed with IR on 11/21.  No further targets available for additional drainage.  Will repeat a 7 day course of abx  Ambulate in hall TID, appreciate PT/OT involvement  Urinary retention: Foley replaced, will need urology f/u as outpatient Dispo: Homeless. Temporary housing plan in place at discharge in [place] with his Aunt.  TOC team consulted and working with housing authority to assist with long term housing.          [name], MD       [facility]       Office: [phone]   Chief Complaint: Rectal carcinoma  Subjective: Patient in bed, nurse at bedside.  Mild nausea this AM - Rx with Zofran.  Taking some po clear liquids.  No emesis.  Objective: Vital signs in last 24 hours: Temp:  [97.5 F (36.4 C)-98.6 F (37 C)] 98.6 F (37 C) (11/26 0622) Pulse Rate:  [93-94] 93 (11/26 0622) Resp:  [17-18] 18 (11/26 0622) BP: (113-129)/(67-81) 129/81 (11/26 0622) SpO2:  [100 %] 100 % (11/26 0622) Weight:  [63.9 kg] 63.9 kg (11/26 0500) Last BM Date: 09/12/21  Intake/Output from previous day: 11/25 0701 - 11/26 0700 In: 3799.7 [P.O.:840; I.V.:2859.7; IV Piggyback:100] Out: 83 [Urine:2800; Drains:50; Stool:2350] Intake/Output this shift: Total I/O In: 120 [P.O.:120] Out: 900 [Urine:400; Stool:500]  Physical Exam: HEENT - sclerae clear, mucous membranes moist Neck - soft Abdomen - softer, still distended; JP with creamy output; ostomy viable with bilious liquid in  bag Neuro - alert & oriented, no focal deficits  Lab Results:  No results for input(s): WBC, HGB, HCT, PLT in the last 72 hours. BMET Recent Labs    09/12/21 0359 09/13/21 0420  NA 139 135  K 4.2 4.1  CL 106 100  CO2 25 27  GLUCOSE 114* 105*  BUN 15 18  CREATININE 0.67 0.72  CALCIUM 9.0 9.1   PT/INR No results for input(s): LABPROT, INR in the last 72 hours. Comprehensive Metabolic Panel:    Component Value Date/Time   NA 135 09/13/2021 0420   NA 139 09/12/2021 0359   K 4.1 09/13/2021 0420   K 4.2 09/12/2021 0359   CL 100 09/13/2021 0420   CL 106 09/12/2021 0359   CO2 27 09/13/2021 0420   CO2 25 09/12/2021 0359   BUN 18 09/13/2021 0420   BUN 15 09/12/2021 0359   CREATININE 0.72 09/13/2021 0420   CREATININE 0.67 09/12/2021 0359   CREATININE 0.79 06/09/2021 1020   CREATININE 0.86 05/07/2021 0826   GLUCOSE 105 (H) 09/13/2021 0420   GLUCOSE 114 (H) 09/12/2021 0359   CALCIUM 9.1 09/13/2021 0420   CALCIUM 9.0 09/12/2021 0359   AST 13 (L) 09/13/2021 0420   AST 15 09/12/2021 0359   AST 11 (L) 06/09/2021 1020   AST 16 05/07/2021 0826   ALT 10 09/13/2021 0420   ALT 11 09/12/2021 0359   ALT 5 06/09/2021 1020   ALT 7 05/07/2021 0826   ALKPHOS 46 09/13/2021 0420   ALKPHOS 44 09/12/2021 0359   BILITOT 0.2 (L) 09/13/2021 0420  BILITOT 0.3 09/12/2021 0359   BILITOT 0.3 06/09/2021 1020   BILITOT 0.3 05/07/2021 0826   PROT 7.4 09/13/2021 0420   PROT 7.0 09/12/2021 0359   ALBUMIN 2.4 (L) 09/13/2021 0420   ALBUMIN 2.3 (L) 09/12/2021 0359    Studies/Results: No results found.    [name] 09/13/2021   Patient ID: Phillip Brooks, male   DOB: March 31, 1956, 65 y.o.   MRN: [id number]

## 2021-09-13 NOTE — Progress Notes (Signed)
PHARMACY - TOTAL PARENTERAL NUTRITION CONSULT NOTE   Indication: intolerance to enteral feeding  Patient Measurements: Height: 5\' 6"  (167.6 cm) Weight: 63.9 kg (140 lb 14 oz) IBW/kg (Calculated) : 63.8 TPN AdjBW (KG): 59.7 Body mass index is 22.74 kg/m.  Assessment:  65 yo M presents on 10/28 for eval of rectal cancer. S/P LAR and diverting ileostomy. Has been NPO for 7 days and NG tube in place with high output. Pharmacy consulted to start TPN. Stopped TPN on 11/15 but had to restart on 11/17 as patient vomited as is unable to tolerate PO.  Glucose / Insulin: No Hx DM. CBGs/SSI stopped on 11/11.  AM glucose controlled. Electrolytes: Corrected Calcium trended up slightly (decreased in TPN yesterday), 10.38. Others WNL.   Renal: SCr and BUN WNL Hepatic: Albumin remains low; LFTs and Tbili WNL, TG 58 (11/21) Intake / Output; MIVF:  NS 75 ml/hr per CCS No NGT, Stool 2350 mL/24 hrs. UOP 2800 mL/24 hrs, drain 50 mL/24 hrs. GI Imaging: 11/4 CT abdomen shows fluid collection, SBO, bladder thickening  11/17 KUB: Dilated small bowel loops are noted concerning for distal small bowel obstruction 11/19 CT:  Small-bowel obstruction, increase in size of gas and fluid abscess formation  GI Surgeries / Procedures:  10/28: Diverting ileostomy   Central access: Implanted Port TPN start date: 11/5 >> 11/15. Resumed 11/17>>  Nutritional Goals:  Goal TPN rate is 85 mL/hr (provides 102 g of protein and 1958 kcals per day)  RD Assessment: Estimated Needs Total Energy Estimated Needs: 1950-2150 Total Protein Estimated Needs: 100-110g Total Fluid Estimated Needs: 2L/day  Current Nutrition:  NPO > clear liquid diet (11/25) TPN  Plan:  Continue TPN at 27mL/hr at 1800 Electrolytes in TPN: Na 100 mEq/L, K 63mEq/L, Ca 62mEq/L, Mg 70mEq/L, and Phos 79mmol/L. Cl:Ac 1:2 Add standard MVI and trace elements to TPN Continue NS at 42ml/hr per CCS TPN labs on Mon/Thurs CMET, Mag, Phos in AM   Lindell Spar, PharmD, [place] [phone] 09/13/2021, 10:07 AM

## 2021-09-14 LAB — COMPREHENSIVE METABOLIC PANEL
ALT: 12 U/L (ref 0–44)
AST: 14 U/L — ABNORMAL LOW (ref 15–41)
Albumin: 2.5 g/dL — ABNORMAL LOW (ref 3.5–5.0)
Alkaline Phosphatase: 45 U/L (ref 38–126)
Anion gap: 7 (ref 5–15)
BUN: 20 mg/dL (ref 8–23)
CO2: 26 mmol/L (ref 22–32)
Calcium: 8.8 mg/dL — ABNORMAL LOW (ref 8.9–10.3)
Chloride: 105 mmol/L (ref 98–111)
Creatinine, Ser: 0.82 mg/dL (ref 0.61–1.24)
GFR, Estimated: >60 mL/min (ref >60)
Glucose, Bld: 108 mg/dL — ABNORMAL HIGH (ref 70–99)
Potassium: 4 mmol/L (ref 3.5–5.1)
Sodium: 138 mmol/L (ref 135–145)
Total Bilirubin: 0.3 mg/dL (ref 0.3–1.2)
Total Protein: 7.3 g/dL (ref 6.5–8.1)

## 2021-09-14 LAB — PHOSPHORUS: Phosphorus: 4 mg/dL (ref 2.5–4.6)

## 2021-09-14 LAB — MAGNESIUM: Magnesium: 2.1 mg/dL (ref 1.7–2.4)

## 2021-09-14 MED ORDER — TRAVASOL 10 % IV SOLN
INTRAVENOUS | Status: AC
  Filled 2021-09-14: qty 1020

## 2021-09-14 NOTE — Progress Notes (Signed)
PHARMACY - TOTAL PARENTERAL NUTRITION CONSULT NOTE   Indication: intolerance to enteral feeding  Patient Measurements: Height: 5\' 6"  (167.6 cm) Weight: 64.5 kg (142 lb 3.2 oz) IBW/kg (Calculated) : 63.8 TPN AdjBW (KG): 59.7 Body mass index is 22.95 kg/m.  Assessment:  65 yo M presents on 10/28 for eval of rectal cancer. S/P LAR and diverting ileostomy. Has been NPO for 7 days and NG tube in place with high output. Pharmacy consulted to start TPN. Stopped TPN on 11/15 but had to restart on 11/17 as patient vomited as is unable to tolerate PO.  Glucose / Insulin: No Hx DM. CBGs/SSI stopped on 11/11.  AM glucose controlled. Electrolytes: All, including Corrected Calcium, WNL.    Renal: SCr and BUN WNL Hepatic: Albumin remains low; LFTs and Tbili WNL, TG 58 (11/21) Intake / Output; MIVF:  NS 75 ml/hr per CCS No NGT, Stool 3650 mL/24 hrs. UOP 2100 mL/24 hrs, drain 5 mL/24 hrs. GI Imaging: 11/4 CT abdomen shows fluid collection, SBO, bladder thickening  11/17 KUB: Dilated small bowel loops are noted concerning for distal small bowel obstruction 11/19 CT:  Small-bowel obstruction, increase in size of gas and fluid abscess formation  GI Surgeries / Procedures:  10/28: Diverting ileostomy   Central access: Implanted Port TPN start date: 11/5 >> 11/15. Resumed 11/17>>  Nutritional Goals:  Goal TPN rate is 85 mL/hr (provides 102 g of protein and 1958 kcals per day)  RD Assessment: Estimated Needs Total Energy Estimated Needs: 1950-2150 Total Protein Estimated Needs: 100-110g Total Fluid Estimated Needs: 2L/day  Current Nutrition:  NPO > clear liquid diet (11/25) TPN  Plan:  Continue TPN at 51mL/hr at 1800 Electrolytes in TPN: Na 100 mEq/L, K 33mEq/L, Ca 15mEq/L, Mg 31mEq/L, and Phos 81mmol/L. Cl:Ac 1:2 Add standard MVI and trace elements to TPN Continue NS at 67ml/hr per CCS TPN labs on Mon/Thurs Follow for advancement of diet, ability to wean TPN   Lindell Spar, PharmD,  [place] [phone] 09/14/2021, 9:01 AM

## 2021-09-14 NOTE — Progress Notes (Signed)
   09/14/21 1300  Mobility  Activity Ambulated in hall  Level of Assistance Standby assist, set-up cues, supervision of patient - no hands on  Assistive Device Front wheel walker  Distance Ambulated (ft) 300 ft  Mobility Ambulated with assistance in hallway  Mobility Response Tolerated well  Mobility performed by Mobility specialist  $Mobility charge 1 Mobility   Pt reluctant to mobilize at first, but eventually agreed to session. Explained to pt that mobilizing will help with his recovery. He ambulated about 322ft in hall with RW, tolerated well. Returned pt EOB so he could empty his colostomy bag. He asked for assistance with small things. Aided pt until her was ready to return to lying position. Pt requested some ice. Retrieved ice for him and left him in bed with call bell at side. RN notified of session.  [facility] Office: [phone]

## 2021-09-14 NOTE — Progress Notes (Signed)
Assessment & Plan: POD#31 - status post XI ROBOTIC ASSISTED LOWER ANTERIOR RESECTION, DIVERTING ILEOSTOMY  Continue TNA, sips clear liquids Ileostomy output stable - continue limited clear liquid diet today Anemia, IV iron given 11/22 Possible anastomotic dehisscence vs abscess from previous perforated rectal cancer: JP drainage catheter appears to be in appropriate position with small continued output Ceftriaxone - 7 day course of abx to complete 11/28 Ambulate in hall TID, appreciate PT/OT involvement  Urinary retention: Foley replaced, will need urology f/u as outpatient Dispo: Homeless. Temporary housing plan in place at discharge in [place] with his Aunt.  TOC team consulted and working with housing authority to assist with long term housing.          [name], MD       [facility]       Office: [phone]   Chief Complaint: Rectal carcinoma  Subjective: Patient in bed, comfortable.  Ambulated yesterday.  Taking limited po liquids.  Objective: Vital signs in last 24 hours: Temp:  [98.5 F (36.9 C)-98.7 F (37.1 C)] 98.6 F (37 C) (11/27 0624) Pulse Rate:  [88-99] 88 (11/27 0624) Resp:  [14-16] 16 (11/27 0624) BP: (98-114)/(65-80) 112/65 (11/27 0624) SpO2:  [97 %-100 %] 100 % (11/27 0624) Weight:  [64.5 kg] 64.5 kg (11/27 0626) Last BM Date: 09/13/21  Intake/Output from previous day: 11/26 0701 - 11/27 0700 In: 5163.8 [P.O.:1320; I.V.:3743.8; IV Piggyback:100] Out: 3244 [Urine:2100; Drains:5; WNUUV:2536] Intake/Output this shift: No intake/output data recorded.  Physical Exam: HEENT - sclerae clear, mucous membranes moist Neck - soft Abdomen - softer, minimal tenderness; ostomy viable, large bilious liquid in bag; JP with small tan output Neuro - alert & oriented, no focal deficits  Lab Results:  No results for input(s): WBC, HGB, HCT, PLT in the last 72 hours. BMET Recent Labs    09/13/21 0420 09/14/21 0432  NA 135 138  K  4.1 4.0  CL 100 105  CO2 27 26  GLUCOSE 105* 108*  BUN 18 20  CREATININE 0.72 0.82  CALCIUM 9.1 8.8*   PT/INR No results for input(s): LABPROT, INR in the last 72 hours. Comprehensive Metabolic Panel:    Component Value Date/Time   NA 138 09/14/2021 0432   NA 135 09/13/2021 0420   K 4.0 09/14/2021 0432   K 4.1 09/13/2021 0420   CL 105 09/14/2021 0432   CL 100 09/13/2021 0420   CO2 26 09/14/2021 0432   CO2 27 09/13/2021 0420   BUN 20 09/14/2021 0432   BUN 18 09/13/2021 0420   CREATININE 0.82 09/14/2021 0432   CREATININE 0.72 09/13/2021 0420   CREATININE 0.79 06/09/2021 1020   CREATININE 0.86 05/07/2021 0826   GLUCOSE 108 (H) 09/14/2021 0432   GLUCOSE 105 (H) 09/13/2021 0420   CALCIUM 8.8 (L) 09/14/2021 0432   CALCIUM 9.1 09/13/2021 0420   AST 14 (L) 09/14/2021 0432   AST 13 (L) 09/13/2021 0420   AST 11 (L) 06/09/2021 1020   AST 16 05/07/2021 0826   ALT 12 09/14/2021 0432   ALT 10 09/13/2021 0420   ALT 5 06/09/2021 1020   ALT 7 05/07/2021 0826   ALKPHOS 45 09/14/2021 0432   ALKPHOS 46 09/13/2021 0420   BILITOT 0.3 09/14/2021 0432   BILITOT 0.2 (L) 09/13/2021 0420   BILITOT 0.3 06/09/2021 1020   BILITOT 0.3 05/07/2021 0826   PROT 7.3 09/14/2021 0432   PROT 7.4 09/13/2021 0420   ALBUMIN 2.5 (L) 09/14/2021 0432   ALBUMIN  2.4 (L) 09/13/2021 0420    Studies/Results: No results found.    [name] 09/14/2021   Patient ID: Valetta Mole, male   DOB: 11/26/1955, 65 y.o.   MRN: [id number]

## 2021-09-15 ENCOUNTER — Inpatient Hospital Stay (HOSPITAL_COMMUNITY): Payer: Medicare HMO

## 2021-09-15 ENCOUNTER — Other Ambulatory Visit (HOSPITAL_COMMUNITY): Payer: Self-pay

## 2021-09-15 DIAGNOSIS — N1330 Unspecified hydronephrosis: Secondary | ICD-10-CM | POA: Diagnosis not present

## 2021-09-15 DIAGNOSIS — K6389 Other specified diseases of intestine: Secondary | ICD-10-CM | POA: Diagnosis not present

## 2021-09-15 DIAGNOSIS — K651 Peritoneal abscess: Secondary | ICD-10-CM | POA: Diagnosis not present

## 2021-09-15 DIAGNOSIS — K3189 Other diseases of stomach and duodenum: Secondary | ICD-10-CM | POA: Diagnosis not present

## 2021-09-15 LAB — COMPREHENSIVE METABOLIC PANEL
ALT: 12 U/L (ref 0–44)
AST: 13 U/L — ABNORMAL LOW (ref 15–41)
Albumin: 2.5 g/dL — ABNORMAL LOW (ref 3.5–5.0)
Alkaline Phosphatase: 41 U/L (ref 38–126)
Anion gap: 7 (ref 5–15)
BUN: 18 mg/dL (ref 8–23)
CO2: 26 mmol/L (ref 22–32)
Calcium: 8.6 mg/dL — ABNORMAL LOW (ref 8.9–10.3)
Chloride: 105 mmol/L (ref 98–111)
Creatinine, Ser: 0.71 mg/dL (ref 0.61–1.24)
GFR, Estimated: >60 mL/min (ref >60)
Glucose, Bld: 112 mg/dL — ABNORMAL HIGH (ref 70–99)
Potassium: 3.8 mmol/L (ref 3.5–5.1)
Sodium: 138 mmol/L (ref 135–145)
Total Bilirubin: 0.1 mg/dL — ABNORMAL LOW (ref 0.3–1.2)
Total Protein: 7.3 g/dL (ref 6.5–8.1)

## 2021-09-15 LAB — PHOSPHORUS: Phosphorus: 3.8 mg/dL (ref 2.5–4.6)

## 2021-09-15 LAB — TRIGLYCERIDES: Triglycerides: 44 mg/dL (ref <150)

## 2021-09-15 LAB — MAGNESIUM: Magnesium: 2.1 mg/dL (ref 1.7–2.4)

## 2021-09-15 MED ORDER — DIATRIZOATE MEGLUMINE & SODIUM 66-10 % PO SOLN
15.0000 mL | ORAL | Status: AC
  Administered 2021-09-15: 09:00:00 15 mL via ORAL
  Filled 2021-09-15: qty 30

## 2021-09-15 MED ORDER — IOHEXOL 350 MG/ML SOLN
80.0000 mL | Freq: Once | INTRAVENOUS | Status: AC | PRN
  Administered 2021-09-15: 16:00:00 80 mL via INTRAVENOUS

## 2021-09-15 MED ORDER — DIATRIZOATE MEGLUMINE & SODIUM 66-10 % PO SOLN
ORAL | Status: AC
  Filled 2021-09-15: qty 30

## 2021-09-15 MED ORDER — TRAVASOL 10 % IV SOLN
INTRAVENOUS | Status: AC
  Filled 2021-09-15: qty 1020

## 2021-09-15 NOTE — Progress Notes (Signed)
31 Days Post-Op robotic LAR, diverting ostomy  Subjective/Chief Complaint:  ambulating in hall, having better ileostomy output  Objective: Vital signs in last 24 hours: Temp:  [98 F (36.7 C)-98.6 F (37 C)] 98 F (36.7 C) (11/28 0459) Pulse Rate:  [90-98] 90 (11/28 0459) Resp:  [16-18] 18 (11/28 0459) BP: (116-120)/(75-91) 120/91 (11/28 0459) SpO2:  [99 %-100 %] 100 % (11/28 0459) Weight:  [59.9 kg] 59.9 kg (11/28 0459) Last BM Date: 09/14/21  Intake/Output from previous day: 11/27 0701 - 11/28 0700 In: 4605.5 [P.O.:720; I.V.:3785.5; IV Piggyback:100] Out: 2229 [Urine:1425; NLGXQ:1194] Intake/Output this shift: Total I/O In: -  Out: 450 [Stool:450]  General appearance: alert, cooperative, and no distress GI: distended, tender in lower abdomen; ostomy pink with liquid stool Incisions OK, JP drain with purulent drainage  Lab Results:  No results for input(s): WBC, HGB, HCT, PLT in the last 72 hours.  BMET Recent Labs    09/14/21 0432 09/15/21 0431  NA 138 138  K 4.0 3.8  CL 105 105  CO2 26 26  GLUCOSE 108* 112*  BUN 20 18  CREATININE 0.82 0.71  CALCIUM 8.8* 8.6*    PT/INR No results for input(s): LABPROT, INR in the last 72 hours. ABG No results for input(s): PHART, HCO3 in the last 72 hours.  Invalid input(s): PCO2, PO2  Studies/Results: No results found.  Anti-infectives: Anti-infectives (From admission, onward)    Start     Dose/Rate Route Frequency Ordered Stop   09/08/21 1400  cefTRIAXone (ROCEPHIN) 2 g in sodium chloride 0.9 % 100 mL IVPB       Note to Pharmacy: Pharmacy may adjust dosing strength / duration / interval for maximal efficacy   2 g 200 mL/hr over 30 Minutes Intravenous Every 24 hours 09/08/21 1307 09/14/21 1431   08/26/21 1000  metroNIDAZOLE (FLAGYL) IVPB 500 mg  Status:  Discontinued        500 mg 100 mL/hr over 60 Minutes Intravenous Every 12 hours 08/26/21 0830 08/26/21 0910   08/26/21 1000  piperacillin-tazobactam (ZOSYN)  IVPB 3.375 g  Status:  Discontinued        3.375 g 12.5 mL/hr over 240 Minutes Intravenous Every 8 hours 08/26/21 0910 09/01/21 0819   08/25/21 1030  metroNIDAZOLE (FLAGYL) tablet 500 mg  Status:  Discontinued        500 mg Per Tube Every 12 hours 08/25/21 0935 08/26/21 0830   08/25/21 1000  cefTRIAXone (ROCEPHIN) 2 g in sodium chloride 0.9 % 100 mL IVPB  Status:  Discontinued       Note to Pharmacy: Pharmacy may adjust dosing strength / duration / interval for maximal efficacy   2 g 200 mL/hr over 30 Minutes Intravenous Every 24 hours 08/25/21 0823 08/26/21 0910   08/25/21 1000  metroNIDAZOLE (FLAGYL) tablet 500 mg  Status:  Discontinued        500 mg Oral Every 12 hours 08/25/21 0837 08/25/21 0935   07/26/2021 2000  cefoTEtan (CEFOTAN) 2 g in sodium chloride 0.9 % 100 mL IVPB        2 g 200 mL/hr over 30 Minutes Intravenous Every 12 hours 08/13/2021 1647 08/17/2021 2046   08/10/2021 0600  cefoTEtan (CEFOTAN) 2 g in sodium chloride 0.9 % 100 mL IVPB        2 g 200 mL/hr over 30 Minutes Intravenous On call to O.R. 07/20/2021 0513 07/22/2021 0806       Assessment/Plan: s/p Procedure(s): XI ROBOTIC ASSISTED LOWER ANTERIOR RESECTION, WITH BILATERAL TAP  BLOCK AND INTRAOPERATIVE ASSESSMENT USING ICG (N/A) DIVERTING ILEOSTOMY (N/A) CYSTOSCOPY with FIREFLY INJECTION AND URETHERAL DILATION (N/A) post op Ileus: most likely related to pelvic infection, NG out 11/7, AXR with continued ileus on 11/17.  NPO, NGT PRN  Severe malnutrition, prolonged ileus: pt not eating well off TPN, Restarted TPN 11/17, pt tolerating min clears  High output ileostomy: will cont fiber pills but hold imodium due to ileus and vomiting.  Anemia: most likely related to chronic illness and malnutrition.  Iron supplements ordered but pt did not tolerate this.  Iron levels low. Will give IV iron today   possible anastomotic dehisscence vs abscess from previous perforated rectal cancer: JP drainage catheter appears to be in  appropriate position, d/c Zosyn 11/14.  wbc normal.  - Rpt CT 11/20 shows increased abscess size with drain in correct position.  Will ask RN to restart drain flushes.  No sign of systemic infection at this time.  Discussed with IR on 11/21.  No further targets available for additional drainage.   7 day course of abx repeated.   Ambulate in hall TID, appreciate PT/OT involvement  Urinary retention: foley replaced on Mon 11/1.  foley out again 11/7, failed voiding trial 11/9 with elevated PVR.   Foley replaced, will need urology f/u as outpatient  Dispo: pt has a temporary housing plan in place at discharge in [place] with his Aunt.  TOC team consulted and working with housing authority to assist with long term housing.      LOS: 31 days    Rosario Adie [id number]

## 2021-09-15 NOTE — Progress Notes (Signed)
PHYSICAL THERAPY  Attempted x 2 to see for amb Downstairs in Radiology "Too tired" after  Rica Koyanagi  PTA [place] Pager      [phone] Office      [phone]

## 2021-09-15 NOTE — Progress Notes (Signed)
PHARMACY - TOTAL PARENTERAL NUTRITION CONSULT NOTE   Indication: intolerance to enteral feeding  Patient Measurements: Height: 5\' 6"  (167.6 cm) Weight: 59.9 kg (132 lb 0.9 oz) IBW/kg (Calculated) : 63.8 TPN AdjBW (KG): 59.7 Body mass index is 21.31 kg/m.  Assessment:  65 yo M presents on 10/28 for eval of rectal cancer. S/P LAR and diverting ileostomy. Has been NPO for 7 days and NG tube in place with high output. Pharmacy consulted to start TPN. Stopped TPN on 11/15 but had to restart on 11/17 as patient vomited as is unable to tolerate PO.  Glucose / Insulin: No Hx DM. CBGs/SSI stopped on 11/11.  AM glucose controlled. Electrolytes: All, including Corrected Calcium, WNL.    Renal: SCr and BUN WNL Hepatic: Albumin remains low; LFTs and Tbili WNL, TG 44(11/28) Intake / Output; MIVF:  NS 75 ml/hr per CCS No NGT, Stool 2600  mL/24 hrs. UOP 1425 mL/24 hrs, drain 0 mL/24 hrs. GI Imaging: 11/4 CT abdomen shows fluid collection, SBO, bladder thickening  11/17 KUB: Dilated small bowel loops are noted concerning for distal small bowel obstruction 11/19 CT:  Small-bowel obstruction, increase in size of gas and fluid abscess formation  GI Surgeries / Procedures:  10/28: LAR/Diverting ileostomy   Central access: Implanted Port TPN start date: 11/5 >> 11/15. Resumed 11/17>>  Nutritional Goals:  Goal TPN rate is 85 mL/hr (provides 102 g of protein and 1958 kcals per day)  RD Assessment: Estimated Needs Total Energy Estimated Needs: 1950-2150 Total Protein Estimated Needs: 100-110g Total Fluid Estimated Needs: 2L/day  Current Nutrition:  NPO > clear liquid diet (11/25) TPN  Plan:  Continue TPN at 70mL/hr at 1800 Electrolytes in TPN: Na 100 mEq/L, K 59mEq/L, Ca 72mEq/L, Mg 28mEq/L, and Phos 67mmol/L. Cl:Ac 1:2 Add standard MVI and trace elements to TPN Continue NS at 76ml/hr per CCS TPN labs on Mon/Thurs Follow for advancement of diet, ability to wean TPN Discussed possibility of  cycling TPN- surgery will consider and let us know tomorrow 11/29  09/15/2021, 7:46 AM

## 2021-09-16 LAB — GLUCOSE, CAPILLARY
Glucose-Capillary: 101 mg/dL — ABNORMAL HIGH (ref 70–99)
Glucose-Capillary: 117 mg/dL — ABNORMAL HIGH (ref 70–99)

## 2021-09-16 MED ORDER — TRAVASOL 10 % IV SOLN
INTRAVENOUS | Status: AC
  Filled 2021-09-16: qty 1020

## 2021-09-16 MED ORDER — ENOXAPARIN SODIUM 40 MG/0.4ML IJ SOSY
40.0000 mg | PREFILLED_SYRINGE | INTRAMUSCULAR | Status: DC
  Administered 2021-09-18 – 2021-10-11 (×23): 40 mg via SUBCUTANEOUS
  Filled 2021-09-16 (×23): qty 0.4

## 2021-09-16 MED ORDER — INSULIN ASPART 100 UNIT/ML IJ SOLN: 0.0000 [IU] | INTRAMUSCULAR | Status: DC

## 2021-09-16 NOTE — Progress Notes (Signed)
32 Days Post-Op robotic LAR, diverting ostomy  Subjective/Chief Complaint:  ambulating in hall, having ileostomy output, denies nausea  Objective: Vital signs in last 24 hours: Temp:  [97.2 F (36.2 C)-98.1 F (36.7 C)] 98.1 F (36.7 C) (11/29 0515) Pulse Rate:  [85-91] 90 (11/29 0515) Resp:  [18] 18 (11/29 0515) BP: (100-135)/(77-89) 100/77 (11/29 0515) SpO2:  [99 %-100 %] 99 % (11/29 0515) Weight:  [59.6 kg] 59.6 kg (11/29 0515) Last BM Date: 09/16/21  Intake/Output from previous day: 11/28 0701 - 11/29 0700 In: 4877.4 [P.O.:1080; I.V.:3797.4] Out: 3860 [Urine:1150; Drains:10; QVZDG:3875] Intake/Output this shift: No intake/output data recorded.  General appearance: alert, cooperative, and no distress GI: distended, tender in lower abdomen; ostomy pink with liquid stool Incisions OK, JP drain with purulent drainage  Lab Results:  No results for input(s): WBC, HGB, HCT, PLT in the last 72 hours.  BMET Recent Labs    09/14/21 0432 09/15/21 0431  NA 138 138  K 4.0 3.8  CL 105 105  CO2 26 26  GLUCOSE 108* 112*  BUN 20 18  CREATININE 0.82 0.71  CALCIUM 8.8* 8.6*    PT/INR No results for input(s): LABPROT, INR in the last 72 hours. ABG No results for input(s): PHART, HCO3 in the last 72 hours.  Invalid input(s): PCO2, PO2  Studies/Results: CT ABDOMEN PELVIS W CONTRAST  Result Date: 09/15/2021 CLINICAL DATA:  Follow-up pelvic abscess. Status post rectal cancer resection on 07/25/2021. EXAM: CT ABDOMEN AND PELVIS WITH CONTRAST TECHNIQUE: Multidetector CT imaging of the abdomen and pelvis was performed using the standard protocol following bolus administration of intravenous contrast. CONTRAST:  50mL OMNIPAQUE IOHEXOL 350 MG/ML SOLN COMPARISON:  09/06/2021 FINDINGS: Lower chest: No significant change in bilateral lower lobe atelectasis/scarring. Hepatobiliary: No focal liver abnormality is seen. No gallstones, gallbladder wall thickening, or biliary dilatation.  Pancreas: Unremarkable. No pancreatic ductal dilatation or surrounding inflammatory changes. Spleen: Normal in size without focal abnormality. Adrenals/Urinary Tract: Urine and air in the urinary bladder with a Foley catheter in place. No significant change in mild bilateral hydronephrosis and proximal hydroureter. Unremarkable adrenal glands. Stomach/Bowel: Again demonstrated are multiple markedly dilated fluid and gas-filled small bowel loops with a transition point in the mid pelvis. Stomach remains mildly dilated and filled with fluid and gas. Normal caliber right colon with an anterior colostomy again demonstrated on the right. Vascular/Lymphatic: No significant vascular findings are present. No enlarged abdominal or pelvic lymph nodes. Reproductive: Mildly to moderately enlarged prostate gland. Other: Again demonstrated is a drainage tube in the presacral space with an a presacral gas and fluid collection. This appears similar to the previous examination, measuring 5.3 x 5.0 cm on image number 69/2 and 4.2 cm in length on sagittal image number 61/5. This measures 5.9 x 5.3 x 5.2 cm on 09/06/2021. Musculoskeletal: Lumbar and lower thoracic spine degenerative changes. IMPRESSION: 1. Mild decrease in size of the gas and fluid collection in the presacral space, currently measuring 5.3 x 5.0 x 4.2 cm. 2. No significant change in small-bowel obstruction with the transition point in the mid pelvis. 3. Stable mild bilateral hydronephrosis. Electronically Signed   By: Claudie Revering M.D.   On: 09/15/2021 16:17    Anti-infectives: Anti-infectives (From admission, onward)    Start     Dose/Rate Route Frequency Ordered Stop   09/08/21 1400  cefTRIAXone (ROCEPHIN) 2 g in sodium chloride 0.9 % 100 mL IVPB       Note to Pharmacy: Pharmacy may adjust dosing strength / duration /  interval for maximal efficacy   2 g 200 mL/hr over 30 Minutes Intravenous Every 24 hours 09/08/21 1307 09/14/21 1431   08/26/21 1000   metroNIDAZOLE (FLAGYL) IVPB 500 mg  Status:  Discontinued        500 mg 100 mL/hr over 60 Minutes Intravenous Every 12 hours 08/26/21 0830 08/26/21 0910   08/26/21 1000  piperacillin-tazobactam (ZOSYN) IVPB 3.375 g  Status:  Discontinued        3.375 g 12.5 mL/hr over 240 Minutes Intravenous Every 8 hours 08/26/21 0910 09/01/21 0819   08/25/21 1030  metroNIDAZOLE (FLAGYL) tablet 500 mg  Status:  Discontinued        500 mg Per Tube Every 12 hours 08/25/21 0935 08/26/21 0830   08/25/21 1000  cefTRIAXone (ROCEPHIN) 2 g in sodium chloride 0.9 % 100 mL IVPB  Status:  Discontinued       Note to Pharmacy: Pharmacy may adjust dosing strength / duration / interval for maximal efficacy   2 g 200 mL/hr over 30 Minutes Intravenous Every 24 hours 08/25/21 0823 08/26/21 0910   08/25/21 1000  metroNIDAZOLE (FLAGYL) tablet 500 mg  Status:  Discontinued        500 mg Oral Every 12 hours 08/25/21 0837 08/25/21 0935   08/09/2021 2000  cefoTEtan (CEFOTAN) 2 g in sodium chloride 0.9 % 100 mL IVPB        2 g 200 mL/hr over 30 Minutes Intravenous Every 12 hours 08/02/2021 1647 08/04/2021 2046   08/11/2021 0600  cefoTEtan (CEFOTAN) 2 g in sodium chloride 0.9 % 100 mL IVPB        2 g 200 mL/hr over 30 Minutes Intravenous On call to O.R. 08/14/2021 0513 08/06/2021 0806       Assessment/Plan: s/p Procedure(s): XI ROBOTIC ASSISTED LOWER ANTERIOR RESECTION, WITH BILATERAL TAP BLOCK AND INTRAOPERATIVE ASSESSMENT USING ICG (N/A) DIVERTING ILEOSTOMY (N/A) CYSTOSCOPY with FIREFLY INJECTION AND URETHERAL DILATION (N/A) post op Ileus: most likely related to pelvic infection, NG out 11/7, AXR with continued ileus on 11/17.  NPO, NGT PRN  Severe malnutrition, prolonged ileus: pt not eating well off TPN, Restarted TPN 11/17, pt tolerating min clears  High output ileostomy: will cont fiber pills but hold imodium due to ileus and vomiting.  Anemia: most likely related to chronic illness and malnutrition.  Iron supplements ordered  but pt did not tolerate this.  Iron levels low. IV iron given   possible anastomotic dehisscence vs abscess from previous perforated rectal cancer: JP drainage catheter appears to be in appropriate position, d/c Zosyn 11/14.  wbc normal.  - Rpt CT 11/20 shows increased abscess size with drain in correct position.   restarted drain flushes.  No sign of systemic infection.  Discussed with IR on 11/21.  No further targets available for additional drainage.   7 day course of abx repeated.   - CT 11/28 fluid collection more organized but slightly smaller, will ask IR to place a drain  Ambulate in hall TID, appreciate PT/OT involvement   Urinary retention: foley replaced on Mon 11/1.  foley out again 11/7, failed voiding trial 11/9 with elevated PVR.   Foley replaced, will need urology f/u as outpatient.  May be able to retry voiding trial once pelvic abscess resolved  Dispo: pt has a temporary housing plan in place at discharge in [place] with his Aunt.  TOC team consulted and working with housing authority to assist with long term housing.      LOS: 32 days  Rosario Adie [id number]

## 2021-09-16 NOTE — Progress Notes (Signed)
Physical Therapy Treatment Patient Details Name: Phillip Brooks MRN: [id number] DOB: [birth year] Today's Date: 09/16/2021   History of Present Illness 65 y.o. male s/p robotic LAR, diverting ostomy on 08/03/2021 for advanced mid rectal cancer.    PT Comments    General Comments: AxO x3 pleasant but not fully compitant with his current medical progression.  pt is dependent on his IV pain meds.  When attempting to discuss discharge plans pt gets vague and defensive. Assisted OOB to amb.  General bed mobility comments: can self perform but today it took him 18 min to complete due to his "mental state" and increased c/o nausea.  Assisted with emptying his ostomy while seated EOB.General transfer comment: good use of hands to steady self.  General Gait Details: limited amb distance due to increased ABD pain.   "you know I am trying".  pt also c/o leakage from his rectum.  "I just washed up.  I can't go out the hallway leaking".   Assisted back to bed and pt called for pain meds. Pt plans to have another drain placement tomorrow due to a growing abscess.  NPO after midnight.   Recommendations for follow up therapy are one component of a multi-disciplinary discharge planning process, led by the attending physician.  Recommendations may be updated based on patient status, additional functional criteria and insurance authorization.  Follow Up Recommendations  Home health PT     Assistance Recommended at Discharge Intermittent Supervision/Assistance  Equipment Recommendations  Rolling walker (2 wheels)    Recommendations for Other Services       Precautions / Restrictions Precautions Precautions: Fall Precaution Comments: ileostomy, R JP Drain, catheter     Mobility  Bed Mobility Overal bed mobility: Modified Independent             General bed mobility comments: can self perform but today it took him 18 min to complete due to his "mental state" and increased c/o nausea.  Assisted with  emptying his ostomy while seated EOB.    Transfers Overall transfer level: Modified independent Equipment used: Rolling walker (2 wheels)   Sit to Stand: Modified independent (Device/Increase time)           General transfer comment: good use of hands to steady self    Ambulation/Gait Ambulation/Gait assistance: Supervision Gait Distance (Feet): 5 Feet Assistive device: Rolling walker (2 wheels) Gait Pattern/deviations: Step-through pattern;Decreased stride length;Trunk flexed Gait velocity: decreased     General Gait Details: limited amb distance due to increased ABD pain.   "you know I am trying".  pt also c/o leakage from his rectum.  "I just washed up.  I can't go out the hallway leaking".   Stairs             Wheelchair Mobility    Modified Rankin (Stroke Patients Only)       Balance                                            Cognition Arousal/Alertness: Awake/alert Behavior During Therapy: WFL for tasks assessed/performed Overall Cognitive Status: Within Functional Limits for tasks assessed                                 General Comments: AxO x3 pleasant but not fully compitant with his current medical progression.  pt is dependent on his IV pain meds.  When attempting to discuss discharge plans pt gets vague and defensive.        Exercises      General Comments        Pertinent Vitals/Pain Pain Assessment: 0-10 Pain Score: 8  Pain Descriptors / Indicators: Grimacing;Tender Pain Intervention(s): Monitored during session;Repositioned;Patient requesting pain meds-RN notified    Home Living                          Prior Function            PT Goals (current goals can now be found in the care plan section) Progress towards PT goals: Progressing toward goals    Frequency    Min 3X/week      PT Plan Current plan remains appropriate    Co-evaluation              AM-PAC PT "6  Clicks" Mobility   Outcome Measure  Help needed turning from your back to your side while in a flat bed without using bedrails?: None Help needed moving from lying on your back to sitting on the side of a flat bed without using bedrails?: None Help needed moving to and from a bed to a chair (including a wheelchair)?: A Little Help needed standing up from a chair using your arms (e.g., wheelchair or bedside chair)?: A Little Help needed to walk in hospital room?: A Little Help needed climbing 3-5 steps with a railing? : A Little 6 Click Score: 20    End of Session Equipment Utilized During Treatment: Gait belt Activity Tolerance: Other (comment);Patient limited by pain Patient left: in chair;with call bell/phone within reach;with chair alarm set Nurse Communication: Mobility status PT Visit Diagnosis: Muscle weakness (generalized) (M62.81);Difficulty in walking, not elsewhere classified (R26.2);Unsteadiness on feet (R26.81);Pain     Time: 8101-7510 PT Time Calculation (min) (ACUTE ONLY): 26 min  Charges:  $Gait Training: 8-22 mins $Therapeutic Activity: 8-22 mins                     {Khasir Woodrome  PTA Acute  Rehabilitation Services Pager      [phone] Office      [phone]

## 2021-09-16 NOTE — Progress Notes (Signed)
Occupational Therapy Treatment Patient Details Name: Phillip Brooks MRN: [id number] DOB: 24-Feb-1956 Today's Date: 09/16/2021   History of present illness 65 y.o. male s/p robotic LAR, diverting ostomy on 08/05/2021 for advanced mid rectal cancer.   OT comments  Upon arrival, pt supine in bed and agreeable to therapy. Pt managing his colostomy while sitting at EOB independently. Providing education on LB dressing and recommending figure four position; pt verbalized understanding and feel comfortable with this technique. Pt reporting he is currently limited to doing it because of nausea and bloating. Pt performing functional mobility in hallway at Mod I level with RW and increased time. All acute OT needs met and continue to recommend dc to home once medically stable. Recommend follow up acutely by mobility specialist for increased walking in hallway. Will sign off. Thank you.    Recommendations for follow up therapy are one component of a multi-disciplinary discharge planning process, led by the attending physician.  Recommendations may be updated based on patient status, additional functional criteria and insurance authorization.    Follow Up Recommendations  No OT follow up    Assistance Recommended at Discharge Intermittent Supervision/Assistance  Equipment Recommendations  None recommended by OT    Recommendations for Other Services      Precautions / Restrictions Precautions Precautions: Fall Precaution Comments: ileostomy, R JP Drain, catheter Restrictions Weight Bearing Restrictions: No       Mobility Bed Mobility Overal bed mobility: Modified Independent                  Transfers Overall transfer level: Modified independent Equipment used: Rolling walker (2 wheels)               General transfer comment: increased time     Balance Overall balance assessment: Mild deficits observed, not formally tested                                          ADL either performed or assessed with clinical judgement   ADL Overall ADL's : Needs assistance/impaired                       Lower Body Dressing Details (indicate cue type and reason): Educating pt on figure four position for LB dressing. Pt abel to bring his legs up high enough but reporting he cannot bring ankles to knees at this moment. Toilet Transfer: Rolling walker (2 wheels);Modified Independent Toilet Transfer Details (indicate cue type and reason): simulated in room   Toileting - Clothing Manipulation Details (indicate cue type and reason): Pt managing colostomy independently while sitting at EOB     Functional mobility during ADLs: Modified independent;Supervision/safety;Rolling walker (2 wheels) General ADL Comments: Providing education on LB dressing and walk in the hallway    Extremity/Trunk Assessment Upper Extremity Assessment Upper Extremity Assessment: Overall WFL for tasks assessed   Lower Extremity Assessment Lower Extremity Assessment: Defer to PT evaluation        Vision       Perception     Praxis      Cognition Arousal/Alertness: Awake/alert Behavior During Therapy: WFL for tasks assessed/performed Overall Cognitive Status: Within Functional Limits for tasks assessed  Exercises     Shoulder Instructions       General Comments      Pertinent Vitals/ Pain       Pain Assessment: 0-10 Pain Score: 9  Faces Pain Scale: Hurts little more Pain Descriptors / Indicators: Grimacing;Tender Pain Intervention(s): Monitored during session;Limited activity within patient's tolerance;Repositioned  Home Living                                          Prior Functioning/Environment              Frequency  Min 1X/week        Progress Toward Goals  OT Goals(current goals can now be found in the care plan section)  Progress towards OT goals:  Progressing toward goals  Acute Rehab OT Goals Patient Stated Goal: to get out of here OT Goal Formulation: With patient Time For Goal Achievement: 09/18/21 Potential to Achieve Goals: Good ADL Goals Pt Will Perform Grooming: with set-up;standing Pt Will Perform Lower Body Dressing: with min guard assist;sitting/lateral leans Pt Will Transfer to Toilet: with modified independence;regular height toilet Pt Will Perform Toileting - Clothing Manipulation and hygiene: with modified independence;sit to/from stand  Plan Discharge plan remains appropriate;All goals met and education completed, patient discharged from OT services    Co-evaluation                 AM-PAC OT "6 Clicks" Daily Activity     Outcome Measure   Help from another person eating meals?: None Help from another person taking care of personal grooming?: None Help from another person toileting, which includes using toliet, bedpan, or urinal?: None Help from another person bathing (including washing, rinsing, drying)?: A Little Help from another person to put on and taking off regular upper body clothing?: None Help from another person to put on and taking off regular lower body clothing?: A Little 6 Click Score: 22    End of Session Equipment Utilized During Treatment: Rolling walker (2 wheels)  OT Visit Diagnosis: Pain;Muscle weakness (generalized) (M62.81) Pain - part of body:  (abdomen)   Activity Tolerance Patient tolerated treatment well   Patient Left in bed;with call bell/phone within reach;with nursing/sitter in room   Nurse Communication Mobility status        Time: 4627-0350 OT Time Calculation (min): 40 min  Charges: OT General Charges $OT Visit: 1 Visit OT Treatments $Self Care/Home Management : 23-37 mins $Therapeutic Activity: 8-22 mins  Laverle Pillard MSOT, OTR/L Acute Rehab Pager: [phone] Office: [place] 09/16/2021, 9:52 AM

## 2021-09-16 NOTE — Progress Notes (Signed)
PHARMACY - TOTAL PARENTERAL NUTRITION CONSULT NOTE   Indication: intolerance to enteral feeding  Patient Measurements: Height: 5\' 6"  (167.6 cm) Weight: 59.6 kg (131 lb 6.3 oz) IBW/kg (Calculated) : 63.8 TPN AdjBW (KG): 59.7 Body mass index is 21.21 kg/m.  Assessment:  65 yo M presents on 10/28 for eval of rectal cancer. S/P LAR and diverting ileostomy. Has been NPO for 7 days and NG tube in place with high output. Pharmacy consulted to start TPN. Stopped TPN on 11/15 but had to restart on 11/17 as patient vomited as is unable to tolerate PO.  Glucose / Insulin: No Hx DM. CBGs/SSI stopped on 11/11.  AM glucose controlled. Electrolytes: All, including Corrected Calcium, WNL.  (11/28)  Renal: SCr and BUN WNL (11/28) Hepatic: Albumin remains low; LFTs and Tbili WNL, TG 44(11/28) Intake / Output; MIVF:  NS 75 ml/hr per CCS No NGT, Stool 2700  mL/24 hrs. UOP 1150 mL/24 hrs, drain 0 mL/24 hrs. GI Imaging: 11/4 CT abdomen shows fluid collection, SBO, bladder thickening  11/17 KUB: Dilated small bowel loops are noted concerning for distal small bowel obstruction 11/19 CT:  Small-bowel obstruction, increase in size of gas and fluid abscess formation  GI Surgeries / Procedures:  10/28: LAR/Diverting ileostomy   Central access: Implanted Port TPN start date: 11/5 >> 11/15. Resumed 11/17>>  Nutritional Goals:  Goal TPN rate is 85 mL/hr (provides 102 g of protein and 1958 kcals per day)  RD Assessment: Estimated Needs Total Energy Estimated Needs: 1950-2150 Total Protein Estimated Needs: 100-110g Total Fluid Estimated Needs: 2L/day  Current Nutrition:  NPO > clear liquid diet (11/25) TPN  Plan:  After discussion with surgery, patient still tolerating minimal clears so will Initiate cyclic TPN today to cycle over 18 hours with 1 hour ramp up/ramp down providing same calories/protein as above Electrolytes in TPN: Na 100 mEq/L, K 30mEq/L, Ca 50mEq/L, Mg 31mEq/L, and Phos 73mmol/L. Cl:Ac  1:2 Add standard MVI and trace elements to TPN Continue NS at 13ml/hr per CCS (max fluid rate with cyclic TPN is 726 ml/hr) GIR max 5.3 mg/kg/min Will check CBGs with very sensitive sliding scale [id number] TPN labs on Mon/Thurs BMET, Mg, and Phos in AM  09/16/2021, 8:19 AM

## 2021-09-16 NOTE — Consult Note (Signed)
Chief Complaint: Patient was seen in consultation today for pelvic abscess at the request of Leighton Ruff, MD   Supervising Physician: Arne Cleveland  Patient Status: [facility] - [facility]  History of Present Illness: Phillip Brooks is a 65 y.o. male is 32 days post op robotic LAR, diverting ostomy, now with post op pelvic abscess. IR consulted for drain placement in presacral collection measuring 5.3 x 5.0 x 4.2 cm.    Pt discusses concerns of pain during and after procedure.     Past Medical History:  Diagnosis Date   Anemia    Rectal cancer ([place])    Stage III   Seasonal allergies     Past Surgical History:  Procedure Laterality Date   COLONOSCOPY     DIVERTING ILEOSTOMY N/A 08/04/2021   Procedure: DIVERTING ILEOSTOMY;  Surgeon: Leighton Ruff, MD;  Location: [facility];  Service: General;  Laterality: N/A;   PORTACATH PLACEMENT Right 01/03/2021   Procedure: INSERTION PORT-A-CATH ULTRASOUND GUIDED THROUGH THE RIGHT IJ.;  Surgeon: Leighton Ruff, MD;  Location: [facility];  Service: General;  Laterality: Right;  ROOM 5 STARTING  AT 12:00PM FOR 60 MIN   TONSILLECTOMY     WRIST SURGERY Right    XI ROBOTIC ASSISTED LOWER ANTERIOR RESECTION N/A 07/24/2021   Procedure: XI ROBOTIC ASSISTED LOWER ANTERIOR RESECTION, WITH BILATERAL TAP BLOCK AND INTRAOPERATIVE ASSESSMENT USING ICG;  Surgeon: Leighton Ruff, MD;  Location: [facility];  Service: General;  Laterality: N/A;    Allergies: Patient has no known allergies.  Medications: Prior to Admission medications   Medication Sig Start Date End Date Taking? Authorizing Provider  acetaminophen (TYLENOL) 650 MG CR tablet Take 650 mg by mouth every 8 (eight) hours as needed for pain.   Yes [provider]  BISACODYL 5 MG EC tablet Take 20 mg by mouth once as needed for constipation. 07/21/21  Yes [provider]  oxyCODONE (OXY IR/ROXICODONE) 5 MG immediate release tablet Take 1 tablet (5 mg total) by mouth every 6 (six) hours as  needed for severe pain. 08/11/21  Yes Owens Shark, NP  polyethylene glycol powder (GLYCOLAX/MIRALAX) 17 GM/SCOOP powder Take 238 g by mouth once as needed. Colonoscopy prep 07/21/21  Yes [provider]  capecitabine (XELODA) 500 MG tablet Take 3 tablets (1,500 mg total) by mouth 2 (two) times daily after a meal. Take Monday-Friday. Take only on days of radiation. Patient not taking: No sig reported 04/18/21   Ladell Pier, MD  metroNIDAZOLE (FLAGYL) 500 MG tablet Take 1,000 mg by mouth 3 (three) times daily. 2 day supply 07/21/21   [provider]  neomycin (MYCIFRADIN) 500 MG tablet Take 1,000 mg by mouth 3 (three) times daily. 2 day supply 07/21/21   [provider]  traMADol (ULTRAM) 50 MG tablet Take 50 mg by mouth every 12 (twelve) hours as needed for moderate pain. 06/25/21   [provider]     Family History  Problem Relation Age of Onset   Hypertension Mother    Heart attack Father    Cancer Sister    Stomach cancer Neg Hx    Colon cancer Neg Hx    Esophageal cancer Neg Hx    Pancreatic cancer Neg Hx    Rectal cancer Neg Hx     Social History   Socioeconomic History   Marital status: Divorced    Spouse name: Not on file   Number of children: Not on file   Years of education: Not on  file   Highest education level: Not on file  Occupational History   Not on file  Tobacco Use   Smoking status: Never   Smokeless tobacco: Current    Types: Snuff, Chew  Vaping Use   Vaping Use: Never used  Substance and Sexual Activity   Alcohol use: Yes    Comment: daily beer    Drug use: No   Sexual activity: Yes    Birth control/protection: Condom  Other Topics Concern   Not on file  Social History Narrative   Not on file   Social Determinants of Health   Financial Resource Strain: Not on file  Food Insecurity: Not on file  Transportation Needs: Not on file  Physical Activity: Not on file  Stress: Not on file  Social Connections: Not on  file    Review of Systems: A 12 point ROS discussed and pertinent positives are indicated in the HPI above.  All other systems are negative.  Review of Systems  Constitutional:  Positive for activity change and fatigue.  HENT: Negative.    Eyes: Negative.   Respiratory: Negative.    Cardiovascular: Negative.   Gastrointestinal:  Positive for abdominal pain.  Endocrine: Negative.    Vital Signs: BP 100/77 (BP Location: Right Arm)   Pulse 90   Temp 98.1 F (36.7 C) (Oral)   Resp 18   Ht 5\' 6"  (1.676 m)   Wt 131 lb 6.3 oz (59.6 kg)   SpO2 99%   BMI 21.21 kg/m   Physical Exam Constitutional:      General: He is not in acute distress. HENT:     Head: Normocephalic and atraumatic.     Nose: Nose normal.     Mouth/Throat:     Mouth: Mucous membranes are dry.     Pharynx: Oropharynx is clear.  Eyes:     Extraocular Movements: Extraocular movements intact.  Cardiovascular:     Rate and Rhythm: Normal rate and regular rhythm.     Pulses: Normal pulses.  Pulmonary:     Effort: Pulmonary effort is normal.  Abdominal:     General: Abdomen is flat. The ostomy site is clean.  Skin:    General: Skin is warm and dry.  Neurological:     General: No focal deficit present.     Mental Status: He is alert.  Psychiatric:        Thought Content: Thought content normal.    Imaging: DG Abd 1 View  Result Date: 08/20/2021 CLINICAL DATA:  Nasogastric tube placement. EXAM: ABDOMEN - 1 VIEW COMPARISON:  None. FINDINGS: NG tube tip is at the level of the mid body of the stomach. There is some prominent air-filled loops of small bowel in the upper abdomen. Lung bases are clear. No acute fractures. Central venous catheter tip projects over the mid SVC. IMPRESSION: 1. Nasogastric tube tip in the mid stomach. Electronically Signed   By: Ronney Asters M.D.   On: 08/20/2021 15:39   CT ABDOMEN PELVIS W CONTRAST  Result Date: 09/15/2021 CLINICAL DATA:  Follow-up pelvic abscess. Status post  rectal cancer resection on 08/11/2021. EXAM: CT ABDOMEN AND PELVIS WITH CONTRAST TECHNIQUE: Multidetector CT imaging of the abdomen and pelvis was performed using the standard protocol following bolus administration of intravenous contrast. CONTRAST:  43mL OMNIPAQUE IOHEXOL 350 MG/ML SOLN COMPARISON:  09/06/2021 FINDINGS: Lower chest: No significant change in bilateral lower lobe atelectasis/scarring. Hepatobiliary: No focal liver abnormality is seen. No gallstones, gallbladder wall thickening, or biliary dilatation.  Pancreas: Unremarkable. No pancreatic ductal dilatation or surrounding inflammatory changes. Spleen: Normal in size without focal abnormality. Adrenals/Urinary Tract: Urine and air in the urinary bladder with a Foley catheter in place. No significant change in mild bilateral hydronephrosis and proximal hydroureter. Unremarkable adrenal glands. Stomach/Bowel: Again demonstrated are multiple markedly dilated fluid and gas-filled small bowel loops with a transition point in the mid pelvis. Stomach remains mildly dilated and filled with fluid and gas. Normal caliber right colon with an anterior colostomy again demonstrated on the right. Vascular/Lymphatic: No significant vascular findings are present. No enlarged abdominal or pelvic lymph nodes. Reproductive: Mildly to moderately enlarged prostate gland. Other: Again demonstrated is a drainage tube in the presacral space with an a presacral gas and fluid collection. This appears similar to the previous examination, measuring 5.3 x 5.0 cm on image number 69/2 and 4.2 cm in length on sagittal image number 61/5. This measures 5.9 x 5.3 x 5.2 cm on 09/06/2021. Musculoskeletal: Lumbar and lower thoracic spine degenerative changes. IMPRESSION: 1. Mild decrease in size of the gas and fluid collection in the presacral space, currently measuring 5.3 x 5.0 x 4.2 cm. 2. No significant change in small-bowel obstruction with the transition point in the mid pelvis. 3.  Stable mild bilateral hydronephrosis. Electronically Signed   By: Claudie Revering M.D.   On: 09/15/2021 16:17   CT ABDOMEN PELVIS W CONTRAST  Result Date: 09/06/2021 CLINICAL DATA:  Abdominal abscess/infection suspected. Rectal cancer ?abscess lower abdomen pain Recent surgery to remove cancer 07/20/2021. History of urethral strictures. EXAM: CT ABDOMEN AND PELVIS WITH CONTRAST TECHNIQUE: Multidetector CT imaging of the abdomen and pelvis was performed using the standard protocol following bolus administration of intravenous contrast. CONTRAST:  85mL OMNIPAQUE IOHEXOL 350 MG/ML SOLN, PO contrast administered with patient only tolerating 1 bottle of oral contrast for exam. COMPARISON:  CT abdomen pelvis 08/22/2021 FINDINGS: Lines and tubes: Right abdominal approach surgical drain with tip terminating within the pelvis in the expected region of the prostate. Foley catheter with tip and balloon terminating within the urinary bladder. Lower chest: Bilateral lower lobe linear atelectasis versus scarring. Left anterior descending coronary artery calcification. Hepatobiliary: No focal liver abnormality. Layering hyperdensity within the gallbladder lumen may represent tiny gallstones versus sludge plated no gallbladder wall thickening or pericholecystic fluid. No biliary dilatation. Pancreas: No focal lesion. Normal pancreatic contour. No surrounding inflammatory changes. No main pancreatic ductal dilatation. Spleen: Normal in size without focal abnormality. Adrenals/Urinary Tract: No adrenal nodule bilaterally. Bilateral kidneys enhance symmetrically. Interval development of mild bilateral, left greater than right, hydroureteronephrosis. Circumferential marked urinary bladder wall thickening. Stomach/Bowel: Surgical changes related to a lower anterior resection and right abdominal diverting ileostomy formation. Stomach is within normal limits. Interval worsening of proximal to mid small bowel dilatation with gas and fluid.  The small bowel measures up to at least 4.6 cm on axial images. Associated air-fluid levels noted. Transition point noted within the mid pelvis (5:70 3-86). Redemonstration of small bowel thickening noted at the transition point (5:86). Vascular/Lymphatic: No abdominal aorta or iliac aneurysm. Mild atherosclerotic plaque of the aorta and its branches. No abdominal, pelvic, or inguinal lymphadenopathy. Reproductive: Prostate is not visualized. Other: No intraperitoneal free fluid. No intraperitoneal free gas. Interval increase in size of a gas and fluid collection within the LAR resection bed/presacral region measuring approximately 5.3 x 5.2 x 5.9 cm (from 5.1 x 5.3 x 3.3 cm) with markedly limited evaluation in the setting of no PO contrast within the pelvis. Musculoskeletal: Subcutaneus  soft tissue emphysema along the right anterior abdominal wall likely postsurgical. Similar findings on the left. No suspicious lytic or blastic osseous lesions. No acute displaced fracture. Multilevel degenerative changes of the spine. IMPRESSION: 1. Small-bowel obstruction with transition point within the mid pelvis in a patient status post lower anterior resection and right abdominal diverting ileostomy formation. Redemonstration of small bowel thickening at the transition point. 2. Interval increase in size of a 5.3 x 5.2 x 5.9 cm gas and fluid abscess formation within the LAR resection bed/presacral region with markedly limited evaluation. Presence of an anastomotic leak could be further interrogated by fluoroscopy or water soluble rectal contrast if desired. 3. Circumferential urinary bladder wall thickening. Foley catheter in appropriate position. Correlate with urinalysis for infection. 4. Interval development of mild bilateral, left greater than right, hydroureteronephrosis. No nephroureterolithiasis. 5. Cholelithiasis versus gallbladder sludge. 6. Aortic Atherosclerosis (ICD10-I70.0). Electronically Signed   By: Iven Finn M.D.   On: 09/06/2021 20:41   CT ABDOMEN PELVIS W CONTRAST  Result Date: 08/22/2021 CLINICAL DATA:  Colon cancer. Status post resection. Follow-up intra-abdominal abscess. EXAM: CT ABDOMEN AND PELVIS WITH CONTRAST TECHNIQUE: Multidetector CT imaging of the abdomen and pelvis was performed using the standard protocol following bolus administration of intravenous contrast. CONTRAST:  58mL OMNIPAQUE IOHEXOL 350 MG/ML SOLN COMPARISON:  CT chest abdomen and pelvis 12/10/2020. FINDINGS: Lower chest: There are bands of atelectasis in the bilateral lower lobes. Hepatobiliary: Gallbladder sludge versus excreted contrast in the gallbladder. There is no bile duct dilatation. The liver is within normal limits. Pancreas: Unremarkable. No pancreatic ductal dilatation or surrounding inflammatory changes. Spleen: Normal in size without focal abnormality. Adrenals/Urinary Tract: A the adrenal glands and kidneys are within normal limits bilaterally. There is broader wall thickening diffusely. Air is seen in the bladder likely related to for catheter placement. Stomach/Bowel: Patient is status post sigmoid colon resection with new right-sided ileostomy. On the sagittal view there is an area concerning for wall defect along the posterior margin of the surgical anastomosis image 9/55. There is an air-fluid collection in the presacral region abutting the surgical anastomosis measuring 5.1 x 5.5 x 3.3 cm. Percutaneous drainage catheter is present in this region along the right lateral margin. There remaining colon is nondilated. The appendix is not seen. There are small bowel loops in the right lower quadrant demonstrating wall thickening and mesenteric edema. Small bowel loops in the left abdomen are fluid-filled and dilated measuring up to 4 cm. Stomach is moderately distended with contrast. Nasogastric tube tip is in the body of the stomach. Vascular/Lymphatic: No significant vascular findings are present. No enlarged  abdominal or pelvic lymph nodes. Reproductive: Prostate gland is prominent in size, unchanged. Other: There is free air in the intramuscular and subcutaneous anterior abdominal wall compatible with recent surgery. There is diffuse body wall edema. There are no fluid collections in the abdominal wall. Musculoskeletal: Degenerative changes affect the spine. IMPRESSION: 1. Status post sigmoid colon resection. There findings worrisome for discontinuity of the posterior bowel wall the level of the anastomosis. There is an air-fluid collection at this level measuring 5.1 x 5.3 x 3.3 cm which may represent abscess or sequelae from perforation. Surgical drain is seen at this level. 2. Findings compatible with small-bowel obstruction. Definitive transition point not visualize, but likely in the mid small bowel. 3. Right-sided ileostomy appears unremarkable. 4. Bladder wall thickening concerning for cystitis. Foley catheter in place. 5. Wall thickening of small bowel loops in the pelvis which may be secondary  to reactive enteritis. This does not appear to be the transition for small bowel obstruction. 6. Nasogastric tube tip in the mid stomach. 7. Body wall edema. 8. These results were called by telephone at the time of interpretation on 08/22/2021 at 5:33 pm to provider Dr. [name], Who verbally acknowledged these results. Electronically Signed   By: Ronney Asters M.D.   On: 08/22/2021 17:34   DG Abd Portable 1V  Result Date: 08/18/2021 CLINICAL DATA:  Vomiting, abdominal distention EXAM: PORTABLE ABDOMEN - 1 VIEW COMPARISON:  CT 12/10/2020 FINDINGS: Dilated small bowel loops are noted in the abdomen concerning for small bowel obstruction. Surgical drain in the pelvis. No free air or organomegaly. IMPRESSION: Dilated small bowel loops concerning for small bowel obstruction. Electronically Signed   By: Rolm Baptise M.D.   On: 08/18/2021 21:23   DG Abd Portable 2V  Result Date: 09/04/2021 CLINICAL DATA:  Nausea.  EXAM: PORTABLE ABDOMEN - 2 VIEW COMPARISON:  August 20, 2021. FINDINGS: Dilated small bowel loops are noted concerning for distal small bowel obstruction. Surgical drain is noted in the pelvis. There is no evidence of free air. No radio-opaque calculi or other significant radiographic abnormality is seen. IMPRESSION: Dilated small bowel loops are noted concerning for distal small bowel obstruction. Electronically Signed   By: Marijo Conception M.D.   On: 09/04/2021 08:15   DG Abd Portable 2V  Result Date: 08/20/2021 CLINICAL DATA:  Abdominal distention EXAM: PORTABLE ABDOMEN - 2 VIEW COMPARISON:  08/18/2021 FINDINGS: There is abnormal dilation of small bowel loops. Dilated small bowel loops measure up to 5.1 cm in diameter. There are multiple air-fluid levels in the small bowel loops. There is no definite demonstrable pneumoperitoneum along the margin of the liver in the decubitus view. Surgical drain is seen in right pelvic cavity. Surgical staples are seen in the pelvis in the region of rectum. There is air in the soft tissues of anterior abdominal wall from recent surgery. IMPRESSION: There is no significant interval change in abnormal dilation of small-bowel loops with air-fluid levels suggesting high-grade partial obstruction. Electronically Signed   By: Elmer Picker M.D.   On: 08/20/2021 10:33    Labs:  CBC: Recent Labs    08/26/21 0416 08/28/21 0430 09/01/21 0417 09/05/21 0745  WBC 18.1* 11.8* 9.9 7.2  HGB 7.5* 7.3* 7.1* 8.0*  HCT 24.1* 23.9* 23.1* 25.0*  PLT 666* 635* 707* 718*    COAGS: Recent Labs    12/06/20 1706  INR 1.1*    BMP: Recent Labs    09/12/21 0359 09/13/21 0420 09/14/21 0432 09/15/21 0431  NA 139 135 138 138  K 4.2 4.1 4.0 3.8  CL 106 100 105 105  CO2 25 27 26 26   GLUCOSE 114* 105* 108* 112*  BUN 15 18 20 18   CALCIUM 9.0 9.1 8.8* 8.6*  CREATININE 0.67 0.72 0.82 0.71  GFRNONAA >60 >60 >60 >60    LIVER FUNCTION TESTS: Recent Labs     09/12/21 0359 09/13/21 0420 09/14/21 0432 09/15/21 0431  BILITOT 0.3 0.2* 0.3 0.1*  AST 15 13* 14* 13*  ALT 11 10 12 12   ALKPHOS 44 46 45 41  PROT 7.0 7.4 7.3 7.3  ALBUMIN 2.3* 2.4* 2.5* 2.5*    TUMOR MARKERS: Recent Labs    12/06/20 1706 05/07/21 0826 07/28/21 1120  CEA 3.8* 5.64* 1.76    Assessment and Plan:  Post operative pelvic abscess --OK to have drain --Will make NPO at midnight  Risks and  benefits discussed with the patient including bleeding, infection, damage to adjacent structures, bowel perforation/fistula connection, and sepsis.  All of the patient's questions were answered, patient is agreeable to proceed. Consent signed and in chart.   Thank you for this interesting consult.  I greatly enjoyed meeting Phillip Brooks and look forward to participating in their care.  A copy of this report was sent to the requesting provider on this date.  Electronically Signed: Pasty Spillers, PA 09/16/2021, 10:08 AM   I spent a total of 40 Minutes in face to face in clinical consultation, greater than 50% of which was counseling/coordinating care for pelvic abscess.

## 2021-09-16 NOTE — Consult Note (Signed)
[place] Nurse ostomy follow up Stoma type/location: RLQ, ileostomy  Stomal assessment/size: 1 1/8" pink, prolapsed, moist Peristomal assessment: intact  Treatment options for stomal/peristomal skin: 2" skin barrier Output liquid green Ostomy pouching: 2pc. 2 1/4" with 2" skin barrier  Education provided:  Patient assisted with emptying WOC  changed pouch; patient able to close lock and roll closure.  Discussed steps in pouch change  Enrolled patient in [place] Start Discharge program: Yes  [place] Nurse will follow along with you for continued support with ostomy teaching and care [place] MSN, [place], [place], [place], [place]

## 2021-09-17 ENCOUNTER — Inpatient Hospital Stay (HOSPITAL_COMMUNITY): Payer: Medicare HMO

## 2021-09-17 DIAGNOSIS — T8143XA Infection following a procedure, organ and space surgical site, initial encounter: Secondary | ICD-10-CM | POA: Diagnosis not present

## 2021-09-17 LAB — BASIC METABOLIC PANEL
Anion gap: 6 (ref 5–15)
BUN: 22 mg/dL (ref 8–23)
CO2: 27 mmol/L (ref 22–32)
Calcium: 8.5 mg/dL — ABNORMAL LOW (ref 8.9–10.3)
Chloride: 103 mmol/L (ref 98–111)
Creatinine, Ser: 0.77 mg/dL (ref 0.61–1.24)
GFR, Estimated: >60 mL/min (ref >60)
Glucose, Bld: 116 mg/dL — ABNORMAL HIGH (ref 70–99)
Potassium: 4.1 mmol/L (ref 3.5–5.1)
Sodium: 136 mmol/L (ref 135–145)

## 2021-09-17 LAB — GLUCOSE, CAPILLARY
Glucose-Capillary: 116 mg/dL — ABNORMAL HIGH (ref 70–99)
Glucose-Capillary: 112 mg/dL — ABNORMAL HIGH (ref 70–99)
Glucose-Capillary: 98 mg/dL (ref 70–99)
Glucose-Capillary: 118 mg/dL — ABNORMAL HIGH (ref 70–99)

## 2021-09-17 LAB — MAGNESIUM: Magnesium: 2.2 mg/dL (ref 1.7–2.4)

## 2021-09-17 LAB — PHOSPHORUS: Phosphorus: 4 mg/dL (ref 2.5–4.6)

## 2021-09-17 MED ORDER — LIDOCAINE HCL (PF) 1 % IJ SOLN
INTRAMUSCULAR | Status: AC | PRN
  Administered 2021-09-17: 10 mL via INTRADERMAL

## 2021-09-17 MED ORDER — SODIUM CHLORIDE 0.9% FLUSH
5.0000 mL | Freq: Three times a day (TID) | INTRAVENOUS | Status: DC
  Administered 2021-09-17 – 2021-10-01 (×38): 5 mL

## 2021-09-17 MED ORDER — INSULIN ASPART 100 UNIT/ML IJ SOLN: 0.0000 [IU] | INTRAMUSCULAR | Status: DC

## 2021-09-17 MED ORDER — HEPARIN SOD (PORK) LOCK FLUSH 100 UNIT/ML IV SOLN
INTRAVENOUS | Status: DC | PRN
  Administered 2021-09-17: 500 [IU] via INTRAVENOUS

## 2021-09-17 MED ORDER — PIPERACILLIN-TAZOBACTAM 3.375 G IVPB
3.3750 g | Freq: Three times a day (TID) | INTRAVENOUS | Status: AC
  Administered 2021-09-17 – 2021-09-22 (×15): 3.375 g via INTRAVENOUS
  Filled 2021-09-17 (×15): qty 50

## 2021-09-17 MED ORDER — SODIUM CHLORIDE 0.9 % IV SOLN
INTRAVENOUS | Status: AC
  Filled 2021-09-17: qty 250

## 2021-09-17 MED ORDER — MIDAZOLAM HCL 2 MG/2ML IJ SOLN
INTRAMUSCULAR | Status: AC | PRN
  Administered 2021-09-17: .5 mg via INTRAVENOUS

## 2021-09-17 MED ORDER — FENTANYL CITRATE (PF) 100 MCG/2ML IJ SOLN
INTRAMUSCULAR | Status: AC | PRN
  Administered 2021-09-17: 50 ug via INTRAVENOUS

## 2021-09-17 MED ORDER — MIDAZOLAM HCL 2 MG/2ML IJ SOLN
INTRAMUSCULAR | Status: AC
  Filled 2021-09-17: qty 4

## 2021-09-17 MED ORDER — FENTANYL CITRATE (PF) 100 MCG/2ML IJ SOLN
INTRAMUSCULAR | Status: AC
  Filled 2021-09-17: qty 4

## 2021-09-17 MED ORDER — TRAVASOL 10 % IV SOLN
INTRAVENOUS | Status: AC
  Filled 2021-09-17: qty 1020

## 2021-09-17 MED ORDER — MIDAZOLAM HCL 2 MG/2ML IJ SOLN
INTRAMUSCULAR | Status: AC | PRN
  Administered 2021-09-17: 1 mg via INTRAVENOUS

## 2021-09-17 MED ORDER — HEPARIN SOD (PORK) LOCK FLUSH 100 UNIT/ML IV SOLN
INTRAVENOUS | Status: AC
  Filled 2021-09-17: qty 5

## 2021-09-17 MED ORDER — HYDROMORPHONE HCL 1 MG/ML IJ SOLN
0.5000 mg | INTRAMUSCULAR | Status: DC | PRN
Start: 2021-09-17 — End: 2021-09-19
  Administered 2021-09-17 – 2021-09-19 (×11): 1 mg via INTRAVENOUS
  Administered 2021-09-19: 0.5 mg via INTRAVENOUS
  Administered 2021-09-19: 1 mg via INTRAVENOUS
  Filled 2021-09-17 (×14): qty 1

## 2021-09-17 NOTE — Progress Notes (Signed)
Nutrition Follow-up  DOCUMENTATION CODES:   Severe malnutrition in context of chronic illness  INTERVENTION:   -TPN cycles management per Pharmacy  -Will monitor for diet advancement  NUTRITION DIAGNOSIS:   Severe Malnutrition related to chronic illness, cancer and cancer related treatments as evidenced by severe fat depletion, severe muscle depletion.  Ongoing.  GOAL:   Patient will meet greater than or equal to 90% of their needs  Meeting with TPN.  MONITOR:   Diet advancement, Labs, Weight trends, Skin, I & O's, Other (Comment) (TPN)  ASSESSMENT:   65 year old male who presented on 10/28 for surgery. PMH of stage III rectal cancer s/p chemotherapy and radiation.  10/28 - s/p cystoscopy with balloon dilation of bulbar urethral stricture and bilateral ureteral catheterization, lower anterior colon resection with diverting ileostomy; clear liquids 10/29 - NPO 10/30 - full liquids 10/31 - NPO 11/2 - NGT placed with significant output 11/4 - CT abdomen showing fluid collection, SBO, bladder thickening 11/5 - TPN started 11/7 - NGT removed 11/9 - CLD 11/10 - FLD 11/11 - Soft diet  11/15 - TPN stopped 11/17 - emesis x2, TPN restarted  Patient currently NPO.  Cyclic TPN over 18 hours continues providing 1958 kcals and 102g protein. Per chart review, IR to place drain today. Has been on clears mainly since 11/25.  Admission weight: 131 lbs. Current weight: 135 lbs  Medications: IV Zofran Labs reviewed:  101-117  Diet Order:   Diet Order             Diet NPO time specified Except for: Sips with Meds  Diet effective midnight                   EDUCATION NEEDS:   Education needs have been addressed  Skin:  Skin Assessment: Skin Integrity Issues: Skin Integrity Issues:: Incisions Incisions: abdomen  Last BM:  11/30 -ileostomy  Height:   Ht Readings from Last 1 Encounters:  07/23/2021 5\' 6"  (1.676 m)    Weight:   Wt Readings from Last 1  Encounters:  09/17/21 61.6 kg    BMI:  Body mass index is 21.92 kg/m.  Estimated Nutritional Needs:   Kcal:  1950-2150  Protein:  100-110g  Fluid:  2L/day  Clayton Bibles, MS, RD, LDN Inpatient Clinical Dietitian Contact information available via Amion

## 2021-09-17 NOTE — Progress Notes (Signed)
PHARMACY - TOTAL PARENTERAL NUTRITION CONSULT NOTE   Indication: intolerance to enteral feeding  Patient Measurements: Height: 5\' 6"  (167.6 cm) Weight: 61.6 kg (135 lb 12.9 oz) IBW/kg (Calculated) : 63.8 TPN AdjBW (KG): 59.7 Body mass index is 21.92 kg/m.  Assessment:  65 yo M presents on 10/28 for eval of rectal cancer. S/P LAR and diverting ileostomy. Has been NPO for 7 days and NG tube in place with high output. Pharmacy consulted to start TPN. Stopped TPN on 11/15 but had to restart on 11/17 as patient vomited as is unable to tolerate PO.  Glucose / Insulin: No Hx DM. CBGs/SSI stopped on 11/11.  AM glucose controlled. CBGs stable upon cycling TPN to 18 hours Electrolytes: All, including Corrected Calcium, WNL.  (11/28)  Renal: SCr and BUN WNL (11/28) Hepatic: Albumin remains low; LFTs and Tbili WNL, TG 44(11/28) Intake / Output; MIVF:  NS 75 ml/hr per CCS No NGT, Stool 1975 mL/24 hrs. UOP 1600 mL/24 hrs, drain 0 mL/24 hrs. GI Imaging: 11/4 CT abdomen shows fluid collection, SBO, bladder thickening  11/17 KUB: Dilated small bowel loops are noted concerning for distal small bowel obstruction 11/19 CT:  Small-bowel obstruction, increase in size of gas and fluid abscess formation  11/28 CTAP: mild decrease in gas/gjuid in presacral space, no change in small bowel obstruction GI Surgeries / Procedures:  10/28: LAR/Diverting ileostomy   Central access: Implanted Port TPN start date: 11/5 >> 11/15. Resumed 11/17>>  Nutritional Goals:  Goal TPN rate is 85 mL/hr (provides 102 g of protein and 1958 kcals per day)  RD Assessment: Estimated Needs Total Energy Estimated Needs: 1950-2150 Total Protein Estimated Needs: 100-110g Total Fluid Estimated Needs: 2L/day  Current Nutrition:  NPO > clear liquid diet (11/25) TPN tolerating 18 h cyclic  Plan:  Change to 12 hour cyclic TPN hours with 1 hour ramp up/ramp down providing same calories/protein as above Electrolytes in TPN: Na 100  mEq/L, K 59mEq/L, Ca 74mEq/L, Mg 28mEq/L, and Phos 59mmol/L. Cl:Ac 1:2 Add standard MVI and trace elements to TPN OK to d/c MIVF (NS) per surgery GIR max 7.51 mg/kg/min will need to follow LFTs and CBGs closely for a few days vs longer cyclic tpn run time vs changing TPN formula Will check CBGs with very sensitive sliding scale [id number] TPN labs on Mon/Thurs  09/17/2021, 7:43 AM

## 2021-09-17 NOTE — Procedures (Signed)
Interventional Radiology Procedure Note  Procedure: CT LEFT TRANSGLUTEAL PRESACRAL ABSCESS DRAIN    Complications: None  Estimated Blood Loss:  MIN  Findings: FECAL EXUDATIVE FLD ASPIRATED CX SENT FULL REPORT IN PACS     Tamera Punt, MD

## 2021-09-17 NOTE — Progress Notes (Signed)
33 Days Post-Op robotic LAR, diverting ostomy  Subjective/Chief Complaint:  ambulating in hall, having ileostomy output, some nausea present  Objective: Vital signs in last 24 hours: Temp:  [97.8 F (36.6 C)-98.4 F (36.9 C)] 97.8 F (36.6 C) (11/30 0455) Pulse Rate:  [87-90] 90 (11/30 0455) Resp:  [17-18] 18 (11/30 0455) BP: (111-120)/(71-79) 112/79 (11/30 0455) SpO2:  [99 %-100 %] 99 % (11/30 0455) Weight:  [61.6 kg] 61.6 kg (11/30 0455) Last BM Date: 09/17/21  Intake/Output from previous day: 11/29 0701 - 11/30 0700 In: 4145.8 [P.O.:720; I.V.:3425.8] Out: 7893 [Urine:1600; Drains:10; YBOFB:5102] Intake/Output this shift: Total I/O In: -  Out: 550 [Urine:275; Stool:275]  General appearance: alert, cooperative, and no distress GI: distended, tender in lower abdomen; ostomy pink with liquid stool Incisions OK, JP drain with purulent drainage  Lab Results:  No results for input(s): WBC, HGB, HCT, PLT in the last 72 hours.  BMET Recent Labs    09/15/21 0431 09/17/21 0432  NA 138 136  K 3.8 4.1  CL 105 103  CO2 26 27  GLUCOSE 112* 116*  BUN 18 22  CREATININE 0.71 0.77  CALCIUM 8.6* 8.5*    PT/INR No results for input(s): LABPROT, INR in the last 72 hours. ABG No results for input(s): PHART, HCO3 in the last 72 hours.  Invalid input(s): PCO2, PO2  Studies/Results: CT ABDOMEN PELVIS W CONTRAST  Result Date: 09/15/2021 CLINICAL DATA:  Follow-up pelvic abscess. Status post rectal cancer resection on 08/14/2021. EXAM: CT ABDOMEN AND PELVIS WITH CONTRAST TECHNIQUE: Multidetector CT imaging of the abdomen and pelvis was performed using the standard protocol following bolus administration of intravenous contrast. CONTRAST:  23mL OMNIPAQUE IOHEXOL 350 MG/ML SOLN COMPARISON:  09/06/2021 FINDINGS: Lower chest: No significant change in bilateral lower lobe atelectasis/scarring. Hepatobiliary: No focal liver abnormality is seen. No gallstones, gallbladder wall thickening,  or biliary dilatation. Pancreas: Unremarkable. No pancreatic ductal dilatation or surrounding inflammatory changes. Spleen: Normal in size without focal abnormality. Adrenals/Urinary Tract: Urine and air in the urinary bladder with a Foley catheter in place. No significant change in mild bilateral hydronephrosis and proximal hydroureter. Unremarkable adrenal glands. Stomach/Bowel: Again demonstrated are multiple markedly dilated fluid and gas-filled small bowel loops with a transition point in the mid pelvis. Stomach remains mildly dilated and filled with fluid and gas. Normal caliber right colon with an anterior colostomy again demonstrated on the right. Vascular/Lymphatic: No significant vascular findings are present. No enlarged abdominal or pelvic lymph nodes. Reproductive: Mildly to moderately enlarged prostate gland. Other: Again demonstrated is a drainage tube in the presacral space with an a presacral gas and fluid collection. This appears similar to the previous examination, measuring 5.3 x 5.0 cm on image number 69/2 and 4.2 cm in length on sagittal image number 61/5. This measures 5.9 x 5.3 x 5.2 cm on 09/06/2021. Musculoskeletal: Lumbar and lower thoracic spine degenerative changes. IMPRESSION: 1. Mild decrease in size of the gas and fluid collection in the presacral space, currently measuring 5.3 x 5.0 x 4.2 cm. 2. No significant change in small-bowel obstruction with the transition point in the mid pelvis. 3. Stable mild bilateral hydronephrosis. Electronically Signed   By: Claudie Revering M.D.   On: 09/15/2021 16:17    Anti-infectives: Anti-infectives (From admission, onward)    Start     Dose/Rate Route Frequency Ordered Stop   09/08/21 1400  cefTRIAXone (ROCEPHIN) 2 g in sodium chloride 0.9 % 100 mL IVPB       Note to Pharmacy: Pharmacy  may adjust dosing strength / duration / interval for maximal efficacy   2 g 200 mL/hr over 30 Minutes Intravenous Every 24 hours 09/08/21 1307 09/14/21 1431    08/26/21 1000  metroNIDAZOLE (FLAGYL) IVPB 500 mg  Status:  Discontinued        500 mg 100 mL/hr over 60 Minutes Intravenous Every 12 hours 08/26/21 0830 08/26/21 0910   08/26/21 1000  piperacillin-tazobactam (ZOSYN) IVPB 3.375 g  Status:  Discontinued        3.375 g 12.5 mL/hr over 240 Minutes Intravenous Every 8 hours 08/26/21 0910 09/01/21 0819   08/25/21 1030  metroNIDAZOLE (FLAGYL) tablet 500 mg  Status:  Discontinued        500 mg Per Tube Every 12 hours 08/25/21 0935 08/26/21 0830   08/25/21 1000  cefTRIAXone (ROCEPHIN) 2 g in sodium chloride 0.9 % 100 mL IVPB  Status:  Discontinued       Note to Pharmacy: Pharmacy may adjust dosing strength / duration / interval for maximal efficacy   2 g 200 mL/hr over 30 Minutes Intravenous Every 24 hours 08/25/21 0823 08/26/21 0910   08/25/21 1000  metroNIDAZOLE (FLAGYL) tablet 500 mg  Status:  Discontinued        500 mg Oral Every 12 hours 08/25/21 0837 08/25/21 0935   07/25/2021 2000  cefoTEtan (CEFOTAN) 2 g in sodium chloride 0.9 % 100 mL IVPB        2 g 200 mL/hr over 30 Minutes Intravenous Every 12 hours 08/14/2021 1647 08/14/2021 2046   07/29/2021 0600  cefoTEtan (CEFOTAN) 2 g in sodium chloride 0.9 % 100 mL IVPB        2 g 200 mL/hr over 30 Minutes Intravenous On call to O.R. 08/13/2021 0513 08/03/2021 0806       Assessment/Plan: s/p Procedure(s): XI ROBOTIC ASSISTED LOWER ANTERIOR RESECTION, WITH BILATERAL TAP BLOCK AND INTRAOPERATIVE ASSESSMENT USING ICG (N/A) DIVERTING ILEOSTOMY (N/A) CYSTOSCOPY with FIREFLY INJECTION AND URETHERAL DILATION (N/A) post op Ileus: most likely related to pelvic infection, NG out 11/7, AXR with continued ileus on 11/17.  NPO, NGT PRN  Severe malnutrition, prolonged ileus: pt not eating well off TPN, Restarted TPN 11/17, pt tolerating min clears  High output ileostomy: will cont fiber pills but hold imodium due to ileus and vomiting.  Anemia: most likely related to chronic illness and malnutrition.  Iron  supplements ordered but pt did not tolerate this.  Iron levels low. IV iron given   possible anastomotic dehisscence vs abscess from previous perforated rectal cancer: JP drainage catheter appears to be in appropriate position, d/c Zosyn 11/14.  wbc normal.  - Rpt CT 11/20 shows increased abscess size with drain in correct position.   restarted drain flushes.  No sign of systemic infection.  Discussed with IR on 11/21.  No further targets available for additional drainage.   7 day course of abx repeated.   - CT 11/28 fluid collection more organized but slightly smaller, IR to place a drain today  Ambulate in hall TID, appreciate PT/OT involvement   Urinary retention: foley replaced on Mon 11/1.  foley out again 11/7, failed voiding trial 11/9 with elevated PVR.   Foley replaced, will need urology f/u as outpatient.  May be able to retry voiding trial once pelvic abscess resolved  Dispo: pt has a temporary housing plan in place at discharge in [place] with his Aunt.  TOC team consulted and working with housing authority to assist with long term housing.  LOS: 33 days    Phillip Brooks [id number]/28/3151

## 2021-09-17 NOTE — Progress Notes (Signed)
   09/17/21 1234  Mobility  Activity Refused mobility   Refused mobility. Pt stated he is having a procedure soon and does not want to get up at that moment. He stated that he was experiencing some abdominal pain as well. Hold mobility for today.   [place] Specialist Acute Rehab Services Office: [phone]

## 2021-09-18 ENCOUNTER — Other Ambulatory Visit (HOSPITAL_COMMUNITY): Payer: Self-pay

## 2021-09-18 LAB — COMPREHENSIVE METABOLIC PANEL
ALT: 12 U/L (ref 0–44)
AST: 9 U/L — ABNORMAL LOW (ref 15–41)
Albumin: 2.6 g/dL — ABNORMAL LOW (ref 3.5–5.0)
Alkaline Phosphatase: 42 U/L (ref 38–126)
Anion gap: 8 (ref 5–15)
BUN: 27 mg/dL — ABNORMAL HIGH (ref 8–23)
CO2: 26 mmol/L (ref 22–32)
Calcium: 8.6 mg/dL — ABNORMAL LOW (ref 8.9–10.3)
Chloride: 104 mmol/L (ref 98–111)
Creatinine, Ser: 0.87 mg/dL (ref 0.61–1.24)
GFR, Estimated: >60 mL/min (ref >60)
Glucose, Bld: 131 mg/dL — ABNORMAL HIGH (ref 70–99)
Potassium: 4.1 mmol/L (ref 3.5–5.1)
Sodium: 138 mmol/L (ref 135–145)
Total Bilirubin: 0.2 mg/dL — ABNORMAL LOW (ref 0.3–1.2)
Total Protein: 7.3 g/dL (ref 6.5–8.1)

## 2021-09-18 LAB — GLUCOSE, CAPILLARY
Glucose-Capillary: 103 mg/dL — ABNORMAL HIGH (ref 70–99)
Glucose-Capillary: 105 mg/dL — ABNORMAL HIGH (ref 70–99)
Glucose-Capillary: 109 mg/dL — ABNORMAL HIGH (ref 70–99)
Glucose-Capillary: 116 mg/dL — ABNORMAL HIGH (ref 70–99)
Glucose-Capillary: 131 mg/dL — ABNORMAL HIGH (ref 70–99)
Glucose-Capillary: 136 mg/dL — ABNORMAL HIGH (ref 70–99)
Glucose-Capillary: 92 mg/dL (ref 70–99)

## 2021-09-18 LAB — MAGNESIUM: Magnesium: 2.3 mg/dL (ref 1.7–2.4)

## 2021-09-18 LAB — PHOSPHORUS: Phosphorus: 4.2 mg/dL (ref 2.5–4.6)

## 2021-09-18 MED ORDER — TRAVASOL 10 % IV SOLN
INTRAVENOUS | Status: AC
  Filled 2021-09-18: qty 1020

## 2021-09-18 MED ORDER — INSULIN ASPART 100 UNIT/ML IJ SOLN
0.0000 [IU] | INTRAMUSCULAR | Status: DC
  Administered 2021-09-19: 1 [IU] via SUBCUTANEOUS

## 2021-09-18 MED ORDER — SODIUM CHLORIDE 0.9 % IV SOLN: INTRAVENOUS | Status: DC

## 2021-09-18 NOTE — Progress Notes (Signed)
Mobility Specialist - Progress Note    09/18/21 1518  Mobility  Activity Ambulated in hall  Level of Assistance Standby assist, set-up cues, supervision of patient - no hands on  Assistive Device Front wheel walker  Distance Ambulated (ft) 450 ft  Mobility Ambulated with assistance in hallway  Mobility Response Tolerated well  Mobility performed by Mobility specialist  $Mobility charge 1 Mobility   Upon entry pt and RN in room, with some encouragement from RN pt agreed to mobilize. Pt used RW to ambulate 450 ft in hallway and had no complaints during session. Returning to room, pt sat EOB to empty ileostomy bag. Once finished, pt returned to bed and was left with call bell at side and awaiting pain medication from RN.   [place] Specialist Acute Rehabilitation Services Phone: [phone] 09/18/21, 3:21 PM

## 2021-09-18 NOTE — Progress Notes (Signed)
34 Days Post-Op robotic LAR, diverting ostomy  Subjective/Chief Complaint:  ambulating in hall, having ileostomy output, TG drain placed yesterday Objective: Vital signs in last 24 hours: Temp:  [97.6 F (36.4 C)-98.7 F (37.1 C)] 98.4 F (36.9 C) (12/01 0514) Pulse Rate:  [86-107] 105 (12/01 0514) Resp:  [14-20] 14 (12/01 0514) BP: (96-118)/(68-85) 118/75 (12/01 0514) SpO2:  [98 %-100 %] 98 % (12/01 0514) Last BM Date: 09/18/21  Intake/Output from previous day: 11/30 0701 - 12/01 0700 In: 3267.3 [P.O.:120; I.V.:3036.1; IV Piggyback:106.2] Out: 3480 [Urine:2000; Drains:55; JJOAC:1660] Intake/Output this shift: No intake/output data recorded.  General appearance: alert, cooperative, and no distress GI: distended, tender in lower abdomen; ostomy pink with liquid stool Incisions OK, both JP drains with purulent drainage  Lab Results:  No results for input(s): WBC, HGB, HCT, PLT in the last 72 hours.  BMET Recent Labs    09/17/21 0432 09/18/21 0403  NA 136 138  K 4.1 4.1  CL 103 104  CO2 27 26  GLUCOSE 116* 131*  BUN 22 27*  CREATININE 0.77 0.87  CALCIUM 8.5* 8.6*    PT/INR No results for input(s): LABPROT, INR in the last 72 hours. ABG No results for input(s): PHART, HCO3 in the last 72 hours.  Invalid input(s): PCO2, PO2  Studies/Results: CT IMAGE GUIDED DRAINAGE BY PERCUTANEOUS CATHETER  Result Date: 09/17/2021 INDICATION: Presacral pelvic abscess postoperatively. EXAM: CT LEFT TRANSGLUTEAL PRESACRAL ABSCESS DRAIN PLACEMENT. MEDICATIONS: The patient is currently admitted to the hospital and receiving intravenous antibiotics. The antibiotics were administered within an appropriate time frame prior to the initiation of the procedure. ANESTHESIA/SEDATION: Moderate (conscious) sedation was employed during this procedure. A total of Versed 3.0 mg and Fentanyl 100 mcg was administered intravenously by the radiology nurse. Total intra-service moderate Sedation Time: 17  minutes. The patient's level of consciousness and vital signs were monitored continuously by radiology nursing throughout the procedure under my direct supervision. COMPLICATIONS: None immediate. PROCEDURE: Informed written consent was obtained from the patient after a thorough discussion of the procedural risks, benefits and alternatives. All questions were addressed. Maximal Sterile Barrier Technique was utilized including caps, mask, sterile gowns, sterile gloves, sterile drape, hand hygiene and skin antiseptic. A timeout was performed prior to the initiation of the procedure. previous imaging reviewed. patient positioned right side down decubitus. noncontrast localization ct performed. the complex air-fluid collection in the presacral area of the lower pelvis was localized and marked for a left trans gluteal approach. under sterile conditions and local anesthesia, an 18 gauge 10 cm access needle was advanced into the presacral abscess. needle position confirmed with ct. guidewire inserted followed by tract dilatation to insert a 10 french drain. drain catheter retention loop formed in the presacral abscess. position confirmed with ct. syringe aspiration yielded 10 cc mildly fecal contaminated exudative fluid. sample sent for culture. catheter secured with prolene suture and connected to external suction bulb. sterile dressing applied. no immediate complication. patient tolerated the procedure well. IMPRESSION: Successful CT-guided presacral pelvic abscess drain placement. Electronically Signed   By: Jerilynn Mages.  Shick M.D.   On: 09/17/2021 15:26    Anti-infectives: Anti-infectives (From admission, onward)    Start     Dose/Rate Route Frequency Ordered Stop   09/17/21 1730  piperacillin-tazobactam (ZOSYN) IVPB 3.375 g        3.375 g 12.5 mL/hr over 240 Minutes Intravenous Every 8 hours 09/17/21 1631 09/22/21 1359   09/08/21 1400  cefTRIAXone (ROCEPHIN) 2 g in sodium chloride 0.9 % 100 mL  IVPB       Note to  Pharmacy: Pharmacy may adjust dosing strength / duration / interval for maximal efficacy   2 g 200 mL/hr over 30 Minutes Intravenous Every 24 hours 09/08/21 1307 09/14/21 1431   08/26/21 1000  metroNIDAZOLE (FLAGYL) IVPB 500 mg  Status:  Discontinued        500 mg 100 mL/hr over 60 Minutes Intravenous Every 12 hours 08/26/21 0830 08/26/21 0910   08/26/21 1000  piperacillin-tazobactam (ZOSYN) IVPB 3.375 g  Status:  Discontinued        3.375 g 12.5 mL/hr over 240 Minutes Intravenous Every 8 hours 08/26/21 0910 09/01/21 0819   08/25/21 1030  metroNIDAZOLE (FLAGYL) tablet 500 mg  Status:  Discontinued        500 mg Per Tube Every 12 hours 08/25/21 0935 08/26/21 0830   08/25/21 1000  cefTRIAXone (ROCEPHIN) 2 g in sodium chloride 0.9 % 100 mL IVPB  Status:  Discontinued       Note to Pharmacy: Pharmacy may adjust dosing strength / duration / interval for maximal efficacy   2 g 200 mL/hr over 30 Minutes Intravenous Every 24 hours 08/25/21 0823 08/26/21 0910   08/25/21 1000  metroNIDAZOLE (FLAGYL) tablet 500 mg  Status:  Discontinued        500 mg Oral Every 12 hours 08/25/21 0837 08/25/21 0935   08/07/2021 2000  cefoTEtan (CEFOTAN) 2 g in sodium chloride 0.9 % 100 mL IVPB        2 g 200 mL/hr over 30 Minutes Intravenous Every 12 hours 08/06/2021 1647 07/23/2021 2046   08/18/2021 0600  cefoTEtan (CEFOTAN) 2 g in sodium chloride 0.9 % 100 mL IVPB        2 g 200 mL/hr over 30 Minutes Intravenous On call to O.R. 08/04/2021 0513 08/07/2021 0806       Assessment/Plan: s/p Procedure(s): XI ROBOTIC ASSISTED LOWER ANTERIOR RESECTION, WITH BILATERAL TAP BLOCK AND INTRAOPERATIVE ASSESSMENT USING ICG (N/A) DIVERTING ILEOSTOMY (N/A) CYSTOSCOPY with FIREFLY INJECTION AND URETHERAL DILATION (N/A) post op Ileus: most likely related to pelvic infection, NG out 11/7, AXR with continued ileus on 11/17.  NPO, NGT PRN  Severe malnutrition, prolonged ileus: pt not eating well off TPN, Restarted TPN 11/17, pt tolerating min  clears  High output ileostomy: will cont fiber pills but hold imodium due to ileus and vomiting.  Anemia: most likely related to chronic illness and malnutrition.  Iron supplements ordered but pt did not tolerate this.  Iron levels low. IV iron given   possible anastomotic dehisscence vs abscess from previous perforated rectal cancer:   - JP drainage catheter appears to be in appropriate position, d/c Zosyn 11/14.  wbc normal.  - Rpt CT 11/20 shows increased abscess size with drain in correct position.   restarted drain flushes.  No sign of systemic infection.  Discussed with IR on 11/21.  No further targets available for additional drainage.   7 day course of abx repeated.   - CT 11/28 fluid collection more organized but slightly smaller, IR placed TG drain 11/30, Zosyn x5 days  Ambulate in hall TID, appreciate PT/OT involvement   Urinary retention: foley replaced on Mon 11/1.  foley out again 11/7, failed voiding trial 11/9 with elevated PVR.   Foley replaced, will need urology f/u as outpatient.  May be able to retry voiding trial once pelvic abscess resolved  Dispo: pt has a temporary housing plan in place at discharge in [place] with his Aunt.  TOC  team consulted and working with housing authority to assist with long term housing.      LOS: 34 days    Rosario Adie 55/11/800

## 2021-09-18 NOTE — Progress Notes (Signed)
PHARMACY - TOTAL PARENTERAL NUTRITION CONSULT NOTE   Indication: intolerance to enteral feeding  Patient Measurements: Height: 5\' 6"  (167.6 cm) Weight: 61.6 kg (135 lb 12.9 oz) IBW/kg (Calculated) : 63.8 TPN AdjBW (KG): 59.7 Body mass index is 21.92 kg/m.  Assessment:  65 yo M presents on 10/28 for eval of rectal cancer. S/P LAR and diverting ileostomy. Has been NPO for 7 days and NG tube in place with high output. Pharmacy consulted to start TPN. Stopped TPN on 11/15 but had to restart on 11/17 as patient vomited as is unable to tolerate PO.  Glucose / Insulin: No Hx DM. CBGs 10-258 on 12 hour cyclic TPN. No SSI required Electrolytes: All, including Corrected Calcium, WNL.   Renal: SCr WNL but up some, BUN now elevated at 27 Hepatic: Albumin remains low; LFTs and Tbili WNL, TG 44(11/28) Intake / Output; MIVF:  MIVF stopped upon transitioning to 12 hour cyclic TPN to avoid excessive fluid rates No NGT, Stool 1425 mL/24 hrs. UOP 2000 mL/24 hrs, drain 0 mL/24 hrs. GI Imaging: 11/4 CT abdomen shows fluid collection, SBO, bladder thickening  11/17 KUB: Dilated small bowel loops are noted concerning for distal small bowel obstruction 11/19 CT:  Small-bowel obstruction, increase in size of gas and fluid abscess formation  11/28 CTAP: mild decrease in gas/gjuid in presacral space, no change in small bowel obstruction GI Surgeries / Procedures:  10/28: LAR/Diverting ileostomy  11/30: Successful CT-guided presacral pelvic abscess drain placement Central access: Implanted Port TPN start date: 11/5 >> 11/15. Resumed 11/17>>  Nutritional Goals:  Goal TPN rate is 85 mL/hr (provides 102 g of protein and 1958 kcals per day)  RD Assessment: Estimated Needs Total Energy Estimated Needs: 1950-2150 Total Protein Estimated Needs: 100-110g Total Fluid Estimated Needs: 2L/day  Current Nutrition:  NPO > clear liquid diet (11/25) TPN transitioned to 12 hour cyclic   AssessmentPlan:  In an  attempt to provide the patient more fluid in response to increasing SCr and BUN, will decrease to 14 hour cyclic TPN hours with 1 hour ramp up/ramp down (78-157 ml/hr) providing same calories/protein as above and resume NS @ 40 ml/hr Electrolytes in TPN: Na 100 mEq/L, K 17mEq/L, Ca 31mEq/L, Mg 60mEq/L, and Phos 41mmol/L. Cl:Ac 1:2 Add standard MVI and trace elements to TPN NS @ 40 ml/hr  GIR max 6.37 mg/kg/min will need to follow LFTs and CBGs closely for a few days Will check CBGs with very sensitive sliding scale 0300-0900-1500-2000 TPN labs on Mon/Thurs CMET, Mg, and Phos in AM  Ulice Dash, PharmD  09/18/2021, 7:07 AM

## 2021-09-19 LAB — COMPREHENSIVE METABOLIC PANEL
ALT: 9 U/L (ref 0–44)
AST: 11 U/L — ABNORMAL LOW (ref 15–41)
Albumin: 2.6 g/dL — ABNORMAL LOW (ref 3.5–5.0)
Alkaline Phosphatase: 44 U/L (ref 38–126)
Anion gap: 7 (ref 5–15)
BUN: 27 mg/dL — ABNORMAL HIGH (ref 8–23)
CO2: 28 mmol/L (ref 22–32)
Calcium: 8.7 mg/dL — ABNORMAL LOW (ref 8.9–10.3)
Chloride: 100 mmol/L (ref 98–111)
Creatinine, Ser: 0.95 mg/dL (ref 0.61–1.24)
GFR, Estimated: >60 mL/min (ref >60)
Glucose, Bld: 146 mg/dL — ABNORMAL HIGH (ref 70–99)
Potassium: 4.1 mmol/L (ref 3.5–5.1)
Sodium: 135 mmol/L (ref 135–145)
Total Bilirubin: 0.4 mg/dL (ref 0.3–1.2)
Total Protein: 7.6 g/dL (ref 6.5–8.1)

## 2021-09-19 LAB — GLUCOSE, CAPILLARY
Glucose-Capillary: 137 mg/dL — ABNORMAL HIGH (ref 70–99)
Glucose-Capillary: 146 mg/dL — ABNORMAL HIGH (ref 70–99)
Glucose-Capillary: 118 mg/dL — ABNORMAL HIGH (ref 70–99)
Glucose-Capillary: 112 mg/dL — ABNORMAL HIGH (ref 70–99)
Glucose-Capillary: 163 mg/dL — ABNORMAL HIGH (ref 70–99)
Glucose-Capillary: 129 mg/dL — ABNORMAL HIGH (ref 70–99)

## 2021-09-19 LAB — PHOSPHORUS: Phosphorus: 3.7 mg/dL (ref 2.5–4.6)

## 2021-09-19 LAB — MAGNESIUM: Magnesium: 2.3 mg/dL (ref 1.7–2.4)

## 2021-09-19 MED ORDER — PROCHLORPERAZINE EDISYLATE 10 MG/2ML IJ SOLN
10.0000 mg | Freq: Four times a day (QID) | INTRAMUSCULAR | Status: DC | PRN
  Administered 2021-09-19 – 2021-10-09 (×6): 10 mg via INTRAVENOUS
  Filled 2021-09-19 (×6): qty 2

## 2021-09-19 MED ORDER — TRAVASOL 10 % IV SOLN
INTRAVENOUS | Status: AC
  Filled 2021-09-19: qty 1020

## 2021-09-19 MED ORDER — HYDROMORPHONE HCL 1 MG/ML IJ SOLN
0.5000 mg | INTRAMUSCULAR | Status: DC | PRN
  Administered 2021-09-19 – 2021-09-24 (×30): 0.5 mg via INTRAVENOUS
  Filled 2021-09-19 (×31): qty 0.5

## 2021-09-19 NOTE — Progress Notes (Signed)
Pt vomited 175 cc's amber liquid earlier this afternoon. Prn med given w relief. Pt calling out now, has vomited another 100 ccs green secretions. MD notified and a different prn med give, will cont to monitor pt.

## 2021-09-19 NOTE — Progress Notes (Signed)
PHARMACY - TOTAL PARENTERAL NUTRITION CONSULT NOTE   Indication: intolerance to enteral feeding  Patient Measurements: Height: 5\' 6"  (167.6 cm) Weight: 61.6 kg (135 lb 12.9 oz) IBW/kg (Calculated) : 63.8 TPN AdjBW (KG): 59.7 Body mass index is 21.92 kg/m.  Assessment:  65 yo M presents on 10/28 for eval of rectal cancer. S/P LAR and diverting ileostomy. Has been NPO for 7 days and NG tube in place with high output. Pharmacy consulted to start TPN. Stopped TPN on 11/15 but had to restart on 11/17 as patient vomited as is unable to tolerate PO.  Glucose / Insulin: No Hx DM. CBGs 657-846 on 14 hour cyclic TPN. No SSI required Electrolytes: All, including Corrected Calcium, WNL.   Renal: SCr WNL but continues to creep up, BUN now elevated but stable at 27 Hepatic: Albumin remains low; LFTs and Tbili WNL, TG 44(11/28) Intake / Output; MIVF:  NS @ 40 ml/hr No NGT, Stool 1750 mL/24 hrs. UOP 2200 mL/24 hrs, drain 35 mL/24 hrs. GI Imaging: 11/4 CT abdomen shows fluid collection, SBO, bladder thickening  11/17 KUB: Dilated small bowel loops are noted concerning for distal small bowel obstruction 11/19 CT:  Small-bowel obstruction, increase in size of gas and fluid abscess formation  11/28 CTAP: mild decrease in gas/gjuid in presacral space, no change in small bowel obstruction GI Surgeries / Procedures:  10/28: LAR/Diverting ileostomy  11/30: Successful CT-guided presacral pelvic abscess drain placement Central access: Implanted Port TPN start date: 11/5 >> 11/15. Resumed 11/17>>  Nutritional Goals:  Goal TPN rate is 85 mL/hr (provides 102 g of protein and 1958 kcals per day)  RD Assessment: Estimated Needs Total Energy Estimated Needs: 1950-2150 Total Protein Estimated Needs: 100-110g Total Fluid Estimated Needs: 2L/day  Current Nutrition:  NPO > clear liquid diet (11/25) TPN transitioned to 14 hour cyclic   AssessmentPlan:  Continue 14 hour cyclic TPN hours with 1 hour ramp  up/ramp down (78-157 ml/hr) providing same calories/protein as above and continue NS @ 40 ml/hr as SCr bumped after stopping MIVF Electrolytes in TPN: Na 100 mEq/L, K 26mEq/L, Ca 28mEq/L, Mg 62mEq/L, and Phos 26mmol/L. Cl:Ac 1:2 Add standard MVI and trace elements to TPN NS @ 40 ml/hr as above GIR max 6.37 mg/kg/min will need to follow LFTs and CBGs closely for a few days Will check CBGs with very sensitive sliding scale 0300-0900-1500-2000 TPN labs on Mon/Thurs CMET, Mg, and Phos in AM  Ulice Dash, PharmD  09/19/2021, 8:55 AM

## 2021-09-19 NOTE — Progress Notes (Signed)
Physical Therapy Treatment Patient Details Name: Phillip Brooks MRN: [id number] DOB: 01-Dec-1955 Today's Date: 09/19/2021   History of Present Illness 65 y.o. male s/p robotic LAR, diverting ostomy on 08/10/2021 for advanced mid rectal cancer.    PT Comments    POD # 35 Upon entering room pt c/o max nausea.  As soon as pt sat EOB he effortlessly vomited 175 cc of what looks like tea.  Pt did not amb with me this session due to nausea/vomiting.  Pt sat EOB > 15 min to see if his symptoms would ease.  Assisted back to bed.   Recommendations for follow up therapy are one component of a multi-disciplinary discharge planning process, led by the attending physician.  Recommendations may be updated based on patient status, additional functional criteria and insurance authorization.  Follow Up Recommendations  Home health PT     Assistance Recommended at Discharge Intermittent Supervision/Assistance  Equipment Recommendations  Rolling walker (2 wheels)    Recommendations for Other Services       Precautions / Restrictions Precautions Precautions: Fall Precaution Comments: ileostomy, B JP Drain, catheter     Mobility  Bed Mobility Overal bed mobility: Modified Independent Bed Mobility: Supine to Sit;Sit to Supine           General bed mobility comments: pt physicall self able only requires assist for blankets, cath line, IV line.    Transfers                        Ambulation/Gait                   Stairs             Wheelchair Mobility    Modified Rankin (Stroke Patients Only)       Balance                                            Cognition Arousal/Alertness: Awake/alert Behavior During Therapy: WFL for tasks assessed/performed                                   General Comments: AxO x3 pleasant but not fully compitant with his current medical progression.  pt is dependent on his IV pain meds.  When  attempting to discuss discharge plans pt gets vague and defensive.        Exercises      General Comments        Pertinent Vitals/Pain Pain Assessment: Faces Faces Pain Scale: Hurts little more Pain Location: ABD increased pain and nausea with vomiting 175 cc Pain Descriptors / Indicators: Grimacing;Tender Pain Intervention(s): Monitored during session;Repositioned    Home Living                          Prior Function            PT Goals (current goals can now be found in the care plan section) Progress towards PT goals: Progressing toward goals    Frequency    Min 3X/week      PT Plan Current plan remains appropriate    Co-evaluation              AM-PAC PT "6 Clicks" Mobility   Outcome Measure  Help needed turning from your back to your side while in a flat bed without using bedrails?: None Help needed moving from lying on your back to sitting on the side of a flat bed without using bedrails?: None Help needed moving to and from a bed to a chair (including a wheelchair)?: A Little Help needed standing up from a chair using your arms (e.g., wheelchair or bedside chair)?: A Little Help needed to walk in hospital room?: A Little Help needed climbing 3-5 steps with a railing? : A Little 6 Click Score: 20    End of Session Equipment Utilized During Treatment: Gait belt Activity Tolerance: Other (comment) (nausea with vomiting) Patient left: in bed;with call bell/phone within reach;with nursing/sitter in room Nurse Communication: Mobility status (pt vomited 175 cc) PT Visit Diagnosis: Muscle weakness (generalized) (M62.81);Difficulty in walking, not elsewhere classified (R26.2);Unsteadiness on feet (R26.81);Pain     Time: 3419-6222 PT Time Calculation (min) (ACUTE ONLY): 27 min  Charges:  $Therapeutic Activity: 23-37 mins                     Rica Koyanagi  PTA Acute  Rehabilitation Services Pager      [phone] Office       [phone]

## 2021-09-19 NOTE — Progress Notes (Signed)
PHARMACY - TOTAL PARENTERAL NUTRITION CONSULT NOTE   Indication: intolerance to enteral feeding  Patient Measurements: Height: 5\' 6"  (167.6 cm) Weight: 61.6 kg (135 lb 12.9 oz) IBW/kg (Calculated) : 63.8 TPN AdjBW (KG): 59.7 Body mass index is 21.92 kg/m.  Assessment:  65 yo M presents on 10/28 for eval of rectal cancer. S/P LAR and diverting ileostomy. Has been NPO for 7 days and NG tube in place with high output. Pharmacy consulted to start TPN. Stopped TPN on 11/15 but had to restart on 11/17 as patient vomited as is unable to tolerate PO.  Glucose / Insulin: No Hx DM. CBGs 119-417 on 14 hour cyclic TPN. No SSI required Electrolytes: All, including Corrected Calcium, WNL.   Renal: SCr WNL but continues to creep up, BUN now elevated but stable at 27 Hepatic: Albumin remains low; LFTs and Tbili WNL, TG 44(11/28) Intake / Output; MIVF:  NS @ 40 ml/hr No NGT, Stool 1750 mL/24 hrs. UOP 2200 mL/24 hrs, drain 35 mL/24 hrs. GI Imaging: 11/4 CT abdomen shows fluid collection, SBO, bladder thickening  11/17 KUB: Dilated small bowel loops are noted concerning for distal small bowel obstruction 11/19 CT:  Small-bowel obstruction, increase in size of gas and fluid abscess formation  11/28 CTAP: mild decrease in gas/gjuid in presacral space, no change in small bowel obstruction GI Surgeries / Procedures:  10/28: LAR/Diverting ileostomy  11/30: Successful CT-guided presacral pelvic abscess drain placement Central access: Implanted Port TPN start date: 11/5 >> 11/15. Resumed 11/17>>  Nutritional Goals:  Goal TPN rate is 85 mL/hr (provides 102 g of protein and 1958 kcals per day)  RD Assessment: Estimated Needs Total Energy Estimated Needs: 1950-2150 Total Protein Estimated Needs: 100-110g Total Fluid Estimated Needs: 2L/day  Current Nutrition:  NPO > clear liquid diet (11/25) TPN transitioned to 12 hour cyclic   AssessmentPlan:  Continue 14 hour cyclic TPN hours with 1 hour ramp  up/ramp down (78-157 ml/hr) providing same calories/protein as above and continue NS @ 40 ml/hr as SCr bumped after stopping MIVF Electrolytes in TPN: Na 100 mEq/L, K 3mEq/L, Ca 44mEq/L, Mg 4mEq/L, and Phos 53mmol/L. Cl:Ac 1:2 Add standard MVI and trace elements to TPN NS @ 40 ml/hr as above GIR max 6.37 mg/kg/min will need to follow LFTs and CBGs closely for a few days Will check CBGs with very sensitive sliding scale 0300-0900-1500-2000 TPN labs on Mon/Thurs CMET, Mg, and Phos in AM  Ulice Dash, PharmD  09/19/2021, 7:10 AM

## 2021-09-19 NOTE — TOC Progression Note (Deleted)
Transition of Care [facility]) - Progression Note    Patient Details  Name: Phillip Brooks MRN: [id number] Date of Birth: 05-29-1956  Transition of Care [facility]) CM/SW [place], [place] Phone Number: 09/19/2021, 8:54 AM  Clinical Narrative:   Got confirmation that wound vac will be delivered to hospital tomorrow. Will send patient to [place] when we have insurance authorization. Facility is working on Tour manager. TOC will continue to follow during the course of hospitalization.     Expected Discharge Plan: Skilled Nursing Facility Barriers to Discharge: Other (must enter comment) (SNF pending insurance auth-[employer])  Expected Discharge Plan and Services Expected Discharge Plan: [place] In-house Referral: Clinical Social Work     Living arrangements for the past 2 months: Single Family Home                 DME Arranged: N/A DME Agency: NA                   Social Determinants of Health (SDOH) Interventions    Readmission Risk Interventions No flowsheet data found.

## 2021-09-19 NOTE — Progress Notes (Signed)
35 Days Post-Op robotic LAR, diverting ostomy  Subjective/Chief Complaint:  ambulating in hall, having ileostomy output  Vital signs in last 24 hours: Temp:  [98 F (36.7 C)-98.1 F (36.7 C)] 98 F (36.7 C) (12/02 0554) Pulse Rate:  [88-103] 88 (12/02 0554) Resp:  [18] 18 (12/02 0554) BP: (116-120)/(77-83) 120/79 (12/02 0554) SpO2:  [100 %] 100 % (12/01 1232) Last BM Date: 09/18/21  Intake/Output from previous day: 12/01 0701 - 12/02 0700 In: 2093.1 [P.O.:360; I.V.:1586; IV Piggyback:147.1] Out: 4005 [Urine:2200; Drains:55; KWIOX:7353] Intake/Output this shift: No intake/output data recorded.  General appearance: alert, cooperative, and no distress GI: distended, tender in lower abdomen; ostomy pink with liquid stool Rectal exam: anastomosis grossly patent Incisions OK, both JP drains with purulent drainage  Lab Results:  No results for input(s): WBC, HGB, HCT, PLT in the last 72 hours.  BMET Recent Labs    09/18/21 0403 09/19/21 0459  NA 138 135  K 4.1 4.1  CL 104 100  CO2 26 28  GLUCOSE 131* 146*  BUN 27* 27*  CREATININE 0.87 0.95  CALCIUM 8.6* 8.7*    PT/INR No results for input(s): LABPROT, INR in the last 72 hours. ABG No results for input(s): PHART, HCO3 in the last 72 hours.  Invalid input(s): PCO2, PO2  Studies/Results: CT IMAGE GUIDED DRAINAGE BY PERCUTANEOUS CATHETER  Result Date: 09/17/2021 INDICATION: Presacral pelvic abscess postoperatively. EXAM: CT LEFT TRANSGLUTEAL PRESACRAL ABSCESS DRAIN PLACEMENT. MEDICATIONS: The patient is currently admitted to the hospital and receiving intravenous antibiotics. The antibiotics were administered within an appropriate time frame prior to the initiation of the procedure. ANESTHESIA/SEDATION: Moderate (conscious) sedation was employed during this procedure. A total of Versed 3.0 mg and Fentanyl 100 mcg was administered intravenously by the radiology nurse. Total intra-service moderate Sedation Time: 17  minutes. The patient's level of consciousness and vital signs were monitored continuously by radiology nursing throughout the procedure under my direct supervision. COMPLICATIONS: None immediate. PROCEDURE: Informed written consent was obtained from the patient after a thorough discussion of the procedural risks, benefits and alternatives. All questions were addressed. Maximal Sterile Barrier Technique was utilized including caps, mask, sterile gowns, sterile gloves, sterile drape, hand hygiene and skin antiseptic. A timeout was performed prior to the initiation of the procedure. previous imaging reviewed. patient positioned right side down decubitus. noncontrast localization ct performed. the complex air-fluid collection in the presacral area of the lower pelvis was localized and marked for a left trans gluteal approach. under sterile conditions and local anesthesia, an 18 gauge 10 cm access needle was advanced into the presacral abscess. needle position confirmed with ct. guidewire inserted followed by tract dilatation to insert a 10 french drain. drain catheter retention loop formed in the presacral abscess. position confirmed with ct. syringe aspiration yielded 10 cc mildly fecal contaminated exudative fluid. sample sent for culture. catheter secured with prolene suture and connected to external suction bulb. sterile dressing applied. no immediate complication. patient tolerated the procedure well. IMPRESSION: Successful CT-guided presacral pelvic abscess drain placement. Electronically Signed   By: Jerilynn Mages.  Shick M.D.   On: 09/17/2021 15:26    Anti-infectives: Anti-infectives (From admission, onward)    Start     Dose/Rate Route Frequency Ordered Stop   09/17/21 1730  piperacillin-tazobactam (ZOSYN) IVPB 3.375 g        3.375 g 12.5 mL/hr over 240 Minutes Intravenous Every 8 hours 09/17/21 1631 09/22/21 1359   09/08/21 1400  cefTRIAXone (ROCEPHIN) 2 g in sodium chloride 0.9 % 100 mL  IVPB       Note to  Pharmacy: Pharmacy may adjust dosing strength / duration / interval for maximal efficacy   2 g 200 mL/hr over 30 Minutes Intravenous Every 24 hours 09/08/21 1307 09/14/21 1431   08/26/21 1000  metroNIDAZOLE (FLAGYL) IVPB 500 mg  Status:  Discontinued        500 mg 100 mL/hr over 60 Minutes Intravenous Every 12 hours 08/26/21 0830 08/26/21 0910   08/26/21 1000  piperacillin-tazobactam (ZOSYN) IVPB 3.375 g  Status:  Discontinued        3.375 g 12.5 mL/hr over 240 Minutes Intravenous Every 8 hours 08/26/21 0910 09/01/21 0819   08/25/21 1030  metroNIDAZOLE (FLAGYL) tablet 500 mg  Status:  Discontinued        500 mg Per Tube Every 12 hours 08/25/21 0935 08/26/21 0830   08/25/21 1000  cefTRIAXone (ROCEPHIN) 2 g in sodium chloride 0.9 % 100 mL IVPB  Status:  Discontinued       Note to Pharmacy: Pharmacy may adjust dosing strength / duration / interval for maximal efficacy   2 g 200 mL/hr over 30 Minutes Intravenous Every 24 hours 08/25/21 0823 08/26/21 0910   08/25/21 1000  metroNIDAZOLE (FLAGYL) tablet 500 mg  Status:  Discontinued        500 mg Oral Every 12 hours 08/25/21 0837 08/25/21 0935   07/28/2021 2000  cefoTEtan (CEFOTAN) 2 g in sodium chloride 0.9 % 100 mL IVPB        2 g 200 mL/hr over 30 Minutes Intravenous Every 12 hours 08/18/2021 1647 08/14/2021 2046   08/13/2021 0600  cefoTEtan (CEFOTAN) 2 g in sodium chloride 0.9 % 100 mL IVPB        2 g 200 mL/hr over 30 Minutes Intravenous On call to O.R. 08/12/2021 0513 08/14/2021 0806       Assessment/Plan: s/p Procedure(s): XI ROBOTIC ASSISTED LOWER ANTERIOR RESECTION, WITH BILATERAL TAP BLOCK AND INTRAOPERATIVE ASSESSMENT USING ICG (N/A) DIVERTING ILEOSTOMY (N/A) CYSTOSCOPY with FIREFLY INJECTION AND URETHERAL DILATION (N/A) post op Ileus: most likely related to pelvic infection, NG out 11/7, AXR with continued ileus on 11/17.  NPO, NGT PRN  Severe malnutrition, prolonged ileus: pt not eating well off TPN, Restarted TPN 11/17, pt tolerating min  clears  High output ileostomy: will cont fiber pills but hold imodium due to ileus and vomiting.  Anemia: most likely related to chronic illness and malnutrition.  Iron supplements ordered but pt did not tolerate this.  Iron levels low. IV iron given   possible anastomotic dehisscence vs abscess from previous perforated rectal cancer:   - JP drainage catheter appears to be in appropriate position, d/c Zosyn 11/14.  wbc normal.  - Rpt CT 11/20 shows increased abscess size with drain in correct position.   restarted drain flushes.  No sign of systemic infection.  Discussed with IR on 11/21.  No further targets available for additional drainage.   7 day course of abx repeated.   - CT 11/28 fluid collection more organized but slightly smaller, IR placed TG drain 11/30, Zosyn x5 days  Ambulate in hall TID, appreciate PT/OT involvement   Urinary retention: foley replaced on Mon 11/1.  foley out again 11/7, failed voiding trial 11/9 with elevated PVR.   Foley replaced, will need urology f/u as outpatient.  May be able to retry voiding trial once pelvic abscess resolved  Dispo: pt has a temporary housing plan in place at discharge in [place] with his Aunt.  TOC  team consulted and working with housing authority to assist with long term housing.      LOS: 35 days    Rosario Adie 99/05/11

## 2021-09-20 LAB — COMPREHENSIVE METABOLIC PANEL
ALT: 11 U/L (ref 0–44)
AST: 12 U/L — ABNORMAL LOW (ref 15–41)
Albumin: 2.7 g/dL — ABNORMAL LOW (ref 3.5–5.0)
Alkaline Phosphatase: 42 U/L (ref 38–126)
Anion gap: 3 — ABNORMAL LOW (ref 5–15)
BUN: 25 mg/dL — ABNORMAL HIGH (ref 8–23)
CO2: 30 mmol/L (ref 22–32)
Calcium: 8.5 mg/dL — ABNORMAL LOW (ref 8.9–10.3)
Chloride: 103 mmol/L (ref 98–111)
Creatinine, Ser: 0.87 mg/dL (ref 0.61–1.24)
GFR, Estimated: >60 mL/min (ref >60)
Glucose, Bld: 141 mg/dL — ABNORMAL HIGH (ref 70–99)
Potassium: 3.8 mmol/L (ref 3.5–5.1)
Sodium: 136 mmol/L (ref 135–145)
Total Bilirubin: 0.5 mg/dL (ref 0.3–1.2)
Total Protein: 7.7 g/dL (ref 6.5–8.1)

## 2021-09-20 LAB — PHOSPHORUS: Phosphorus: 3.1 mg/dL (ref 2.5–4.6)

## 2021-09-20 LAB — MAGNESIUM: Magnesium: 2.2 mg/dL (ref 1.7–2.4)

## 2021-09-20 LAB — GLUCOSE, CAPILLARY
Glucose-Capillary: 142 mg/dL — ABNORMAL HIGH (ref 70–99)
Glucose-Capillary: 123 mg/dL — ABNORMAL HIGH (ref 70–99)
Glucose-Capillary: 113 mg/dL — ABNORMAL HIGH (ref 70–99)
Glucose-Capillary: 102 mg/dL — ABNORMAL HIGH (ref 70–99)
Glucose-Capillary: 143 mg/dL — ABNORMAL HIGH (ref 70–99)

## 2021-09-20 MED ORDER — TRAVASOL 10 % IV SOLN
INTRAVENOUS | Status: AC
  Filled 2021-09-20: qty 1020

## 2021-09-20 NOTE — Progress Notes (Signed)
36 Days Post-Op robotic LAR, diverting ostomy  Subjective/Chief Complaint:  ambulating in hall, having ileostomy output, vomited last night, still refusing NGT  Vital signs in last 24 hours: Temp:  [98 F (36.7 C)-98.3 F (36.8 C)] 98 F (36.7 C) (12/03 0558) Pulse Rate:  [95-96] 95 (12/03 0558) Resp:  [18] 18 (12/03 0558) BP: (116-125)/(77-80) 125/80 (12/03 0558) SpO2:  [98 %-99 %] 99 % (12/02 2135) Last BM Date: 09/19/21  Intake/Output from previous day: 12/02 0701 - 12/03 0700 In: 3488.5 [P.O.:1080; I.V.:2251.7; IV Piggyback:146.8] Out: 4680 [Urine:2900; Emesis/NG output:100; Drains:80; BSWHQ:7591] Intake/Output this shift: No intake/output data recorded.  General appearance: alert, cooperative, and no distress GI: distended, less tender in lower abdomen; ostomy pink with liquid stool  Incisions OK, both JP drains with purulent drainage  Lab Results:  No results for input(s): WBC, HGB, HCT, PLT in the last 72 hours.  BMET Recent Labs    09/19/21 0459 09/20/21 0521  NA 135 136  K 4.1 3.8  CL 100 103  CO2 28 30  GLUCOSE 146* 141*  BUN 27* 25*  CREATININE 0.95 0.87  CALCIUM 8.7* 8.5*    PT/INR No results for input(s): LABPROT, INR in the last 72 hours. ABG No results for input(s): PHART, HCO3 in the last 72 hours.  Invalid input(s): PCO2, PO2  Studies/Results: No results found.  Anti-infectives: Anti-infectives (From admission, onward)    Start     Dose/Rate Route Frequency Ordered Stop   09/17/21 1730  piperacillin-tazobactam (ZOSYN) IVPB 3.375 g        3.375 g 12.5 mL/hr over 240 Minutes Intravenous Every 8 hours 09/17/21 1631 09/22/21 1359   09/08/21 1400  cefTRIAXone (ROCEPHIN) 2 g in sodium chloride 0.9 % 100 mL IVPB       Note to Pharmacy: Pharmacy may adjust dosing strength / duration / interval for maximal efficacy   2 g 200 mL/hr over 30 Minutes Intravenous Every 24 hours 09/08/21 1307 09/14/21 1431   08/26/21 1000  metroNIDAZOLE (FLAGYL)  IVPB 500 mg  Status:  Discontinued        500 mg 100 mL/hr over 60 Minutes Intravenous Every 12 hours 08/26/21 0830 08/26/21 0910   08/26/21 1000  piperacillin-tazobactam (ZOSYN) IVPB 3.375 g  Status:  Discontinued        3.375 g 12.5 mL/hr over 240 Minutes Intravenous Every 8 hours 08/26/21 0910 09/01/21 0819   08/25/21 1030  metroNIDAZOLE (FLAGYL) tablet 500 mg  Status:  Discontinued        500 mg Per Tube Every 12 hours 08/25/21 0935 08/26/21 0830   08/25/21 1000  cefTRIAXone (ROCEPHIN) 2 g in sodium chloride 0.9 % 100 mL IVPB  Status:  Discontinued       Note to Pharmacy: Pharmacy may adjust dosing strength / duration / interval for maximal efficacy   2 g 200 mL/hr over 30 Minutes Intravenous Every 24 hours 08/25/21 0823 08/26/21 0910   08/25/21 1000  metroNIDAZOLE (FLAGYL) tablet 500 mg  Status:  Discontinued        500 mg Oral Every 12 hours 08/25/21 0837 08/25/21 0935   07/25/2021 2000  cefoTEtan (CEFOTAN) 2 g in sodium chloride 0.9 % 100 mL IVPB        2 g 200 mL/hr over 30 Minutes Intravenous Every 12 hours 08/10/2021 1647 08/18/2021 2046   08/04/2021 0600  cefoTEtan (CEFOTAN) 2 g in sodium chloride 0.9 % 100 mL IVPB        2 g 200 mL/hr  over 30 Minutes Intravenous On call to O.R. 08/01/2021 0513 07/24/2021 0806       Assessment/Plan: s/p Procedure(s): XI ROBOTIC ASSISTED LOWER ANTERIOR RESECTION, WITH BILATERAL TAP BLOCK AND INTRAOPERATIVE ASSESSMENT USING ICG (N/A) DIVERTING ILEOSTOMY (N/A) CYSTOSCOPY with FIREFLY INJECTION AND URETHERAL DILATION (N/A) post op Ileus: most likely related to pelvic infection, NG out 11/7, AXR with continued ileus on 11/17.  NPO, NGT PRN  Severe malnutrition, prolonged ileus: pt not eating well off TPN, Restarted TPN 11/17, pt tolerating min clears  High output ileostomy: will cont fiber pills but hold imodium due to ileus and vomiting.  Anemia: most likely related to chronic illness and malnutrition.  Iron supplements ordered but pt did not tolerate  this.  Iron levels low. IV iron given   possible anastomotic dehisscence vs abscess from previous perforated rectal cancer:   - JP drainage catheter appears to be in appropriate position, d/c Zosyn 11/14.  wbc normal.  - Rpt CT 11/20 shows increased abscess size with drain in correct position.   restarted drain flushes.  No sign of systemic infection.  Discussed with IR on 11/21.  No further targets available for additional drainage.   7 day course of abx repeated.   - CT 11/28 fluid collection more organized but slightly smaller, IR placed TG drain 11/30, Zosyn x5 days  Ambulate in hall TID, appreciate PT/OT involvement   Urinary retention: foley replaced on Mon 11/1.  foley out again 11/7, failed voiding trial 11/9 with elevated PVR.   Foley replaced, will need urology f/u as outpatient.  May be able to retry voiding trial once pelvic abscess resolved  Dispo: pt has a temporary housing plan in place at discharge in [place] with his Aunt.  TOC team consulted and working with housing authority to assist with long term housing.      LOS: 36 days    Phillip Brooks [id number]/11/7033

## 2021-09-20 NOTE — Progress Notes (Signed)
PHARMACY - TOTAL PARENTERAL NUTRITION CONSULT NOTE   Indication: intolerance to enteral feeding  Patient Measurements: Height: 5\' 6"  (167.6 cm) Weight: 61.6 kg (135 lb 12.9 oz) IBW/kg (Calculated) : 63.8 TPN AdjBW (KG): 59.7 Body mass index is 21.92 kg/m.  Assessment:  65 yo M presents on 10/28 for eval of rectal cancer. S/P LAR and diverting ileostomy. Has been NPO for 7 days and NG tube in place with high output. Pharmacy consulted to start TPN. Stopped TPN on 11/15 but had to restart on 11/17 as patient vomited as is unable to tolerate PO.  Glucose / Insulin: No Hx DM. CBGs 112-163 (goal < 161) on 14 hour cyclic TPN. No SSI required Electrolytes: All, including Corrected Calcium, WNL.   Renal: SCr WNL but continues to creep up, BUN now elevated but stable at 27 Hepatic: Albumin remains low; LFTs and Tbili WNL, TG 44(11/28) Intake / Output; MIVF:  NS @ 40 ml/hr No NGT (pt refuses), Stool 1600 mL/24 hrs. UOP 2900 mL/24 hrs, drain 80 mL/24 hrs. GI Imaging: 11/4 CT abdomen shows fluid collection, SBO, bladder thickening  11/17 KUB: Dilated small bowel loops are noted concerning for distal small bowel obstruction 11/19 CT:  Small-bowel obstruction, increase in size of gas and fluid abscess formation  11/28 CTAP: mild decrease in gas/gjuid in presacral space, no change in small bowel obstruction GI Surgeries / Procedures:  10/28: LAR/Diverting ileostomy  11/30: Successful CT-guided presacral pelvic abscess drain placement Central access: Implanted Port TPN start date: 11/5 >> 11/15. Resumed 11/17>>  Nutritional Goals:  Goal TPN rate is 85 mL/hr (provides 102 g of protein and 1958 kcals per day)  RD Assessment: Estimated Needs Total Energy Estimated Needs: 1950-2150 Total Protein Estimated Needs: 100-110g Total Fluid Estimated Needs: 2L/day  Current Nutrition:  NPO > clear liquid diet (11/25) - tolerating minimal clears TPN transitioned to 14 hour cyclic   AssessmentPlan:   Continue 14 hour cyclic TPN hours with 1 hour ramp up/ramp down (78-157 ml/hr) providing same calories/protein as above and continue NS @ 40 ml/hr as SCr bumped after stopping MIVF Electrolytes in TPN: Na 100 mEq/L, K 47mEq/L, Ca 89mEq/L, Mg 78mEq/L, and Phos 48mmol/L. Cl:Ac 1:2 Add standard MVI and trace elements to TPN NS @ 40 ml/hr as above GIR max 6.37 mg/kg/min will need to follow LFTs and CBGs closely for a few days Will check CBGs with very sensitive sliding scale 0300-0900-1500-2000 TPN labs on Mon/Thurs CMET in AM   Adrian Saran, PharmD, BCPS Secure Chat if ?s 09/20/2021 8:17 AM

## 2021-09-21 ENCOUNTER — Inpatient Hospital Stay (HOSPITAL_COMMUNITY): Payer: Medicare HMO

## 2021-09-21 LAB — COMPREHENSIVE METABOLIC PANEL
ALT: 10 U/L (ref 0–44)
AST: 11 U/L — ABNORMAL LOW (ref 15–41)
Albumin: 2.7 g/dL — ABNORMAL LOW (ref 3.5–5.0)
Alkaline Phosphatase: 40 U/L (ref 38–126)
Anion gap: 8 (ref 5–15)
BUN: 26 mg/dL — ABNORMAL HIGH (ref 8–23)
CO2: 28 mmol/L (ref 22–32)
Calcium: 8.8 mg/dL — ABNORMAL LOW (ref 8.9–10.3)
Chloride: 101 mmol/L (ref 98–111)
Creatinine, Ser: 0.92 mg/dL (ref 0.61–1.24)
GFR, Estimated: >60 mL/min (ref >60)
Glucose, Bld: 143 mg/dL — ABNORMAL HIGH (ref 70–99)
Potassium: 3.7 mmol/L (ref 3.5–5.1)
Sodium: 137 mmol/L (ref 135–145)
Total Bilirubin: 0.2 mg/dL — ABNORMAL LOW (ref 0.3–1.2)
Total Protein: 7.5 g/dL (ref 6.5–8.1)

## 2021-09-21 LAB — AEROBIC/ANAEROBIC CULTURE W GRAM STAIN (SURGICAL/DEEP WOUND)
Gram Stain: NONE SEEN
Special Requests: NORMAL

## 2021-09-21 LAB — GLUCOSE, CAPILLARY
Glucose-Capillary: 128 mg/dL — ABNORMAL HIGH (ref 70–99)
Glucose-Capillary: 124 mg/dL — ABNORMAL HIGH (ref 70–99)
Glucose-Capillary: 135 mg/dL — ABNORMAL HIGH (ref 70–99)
Glucose-Capillary: 106 mg/dL — ABNORMAL HIGH (ref 70–99)
Glucose-Capillary: 140 mg/dL — ABNORMAL HIGH (ref 70–99)

## 2021-09-21 MED ORDER — LIDOCAINE HCL URETHRAL/MUCOSAL 2 % EX GEL
1.0000 "application " | Freq: Once | CUTANEOUS | Status: AC
  Administered 2021-09-21: 1
  Filled 2021-09-21: qty 5

## 2021-09-21 MED ORDER — TRAVASOL 10 % IV SOLN
INTRAVENOUS | Status: AC
  Filled 2021-09-21: qty 1020

## 2021-09-21 NOTE — Progress Notes (Signed)
37 Days Post-Op robotic LAR, diverting ostomy  Subjective/Chief Complaint:  ambulating in hall, having ileostomy output  Vital signs in last 24 hours: Temp:  [97.7 F (36.5 C)-98.9 F (37.2 C)] 98.9 F (37.2 C) (12/04 0541) Pulse Rate:  [94-106] 105 (12/04 0541) Resp:  [14-18] 18 (12/04 0541) BP: (114-125)/(73-88) 120/88 (12/04 0541) SpO2:  [99 %-100 %] 99 % (12/04 0541) Last BM Date: 09/20/21  Intake/Output from previous day: 12/03 0701 - 12/04 0700 In: 3224.2 [P.O.:770; I.V.:2288.3; IV Piggyback:150.9] Out: 3790 [Urine:2300; Drains:65; IWPYK:9983] Intake/Output this shift: No intake/output data recorded.  General appearance: alert, cooperative, and no distress GI: distended, non tender in lower abdomen; ostomy pink with liquid stool  Incisions OK, both JP drains with purulent drainage  Lab Results:  No results for input(s): WBC, HGB, HCT, PLT in the last 72 hours.  BMET Recent Labs    09/20/21 0521 09/21/21 0424  NA 136 137  K 3.8 3.7  CL 103 101  CO2 30 28  GLUCOSE 141* 143*  BUN 25* 26*  CREATININE 0.87 0.92  CALCIUM 8.5* 8.8*    PT/INR No results for input(s): LABPROT, INR in the last 72 hours. ABG No results for input(s): PHART, HCO3 in the last 72 hours.  Invalid input(s): PCO2, PO2  Studies/Results: No results found.  Anti-infectives: Anti-infectives (From admission, onward)    Start     Dose/Rate Route Frequency Ordered Stop   09/17/21 1730  piperacillin-tazobactam (ZOSYN) IVPB 3.375 g        3.375 g 12.5 mL/hr over 240 Minutes Intravenous Every 8 hours 09/17/21 1631 09/22/21 1359   09/08/21 1400  cefTRIAXone (ROCEPHIN) 2 g in sodium chloride 0.9 % 100 mL IVPB       Note to Pharmacy: Pharmacy may adjust dosing strength / duration / interval for maximal efficacy   2 g 200 mL/hr over 30 Minutes Intravenous Every 24 hours 09/08/21 1307 09/14/21 1431   08/26/21 1000  metroNIDAZOLE (FLAGYL) IVPB 500 mg  Status:  Discontinued        500 mg 100  mL/hr over 60 Minutes Intravenous Every 12 hours 08/26/21 0830 08/26/21 0910   08/26/21 1000  piperacillin-tazobactam (ZOSYN) IVPB 3.375 g  Status:  Discontinued        3.375 g 12.5 mL/hr over 240 Minutes Intravenous Every 8 hours 08/26/21 0910 09/01/21 0819   08/25/21 1030  metroNIDAZOLE (FLAGYL) tablet 500 mg  Status:  Discontinued        500 mg Per Tube Every 12 hours 08/25/21 0935 08/26/21 0830   08/25/21 1000  cefTRIAXone (ROCEPHIN) 2 g in sodium chloride 0.9 % 100 mL IVPB  Status:  Discontinued       Note to Pharmacy: Pharmacy may adjust dosing strength / duration / interval for maximal efficacy   2 g 200 mL/hr over 30 Minutes Intravenous Every 24 hours 08/25/21 0823 08/26/21 0910   08/25/21 1000  metroNIDAZOLE (FLAGYL) tablet 500 mg  Status:  Discontinued        500 mg Oral Every 12 hours 08/25/21 0837 08/25/21 0935   07/21/2021 2000  cefoTEtan (CEFOTAN) 2 g in sodium chloride 0.9 % 100 mL IVPB        2 g 200 mL/hr over 30 Minutes Intravenous Every 12 hours 08/03/2021 1647 08/06/2021 2046   08/04/2021 0600  cefoTEtan (CEFOTAN) 2 g in sodium chloride 0.9 % 100 mL IVPB        2 g 200 mL/hr over 30 Minutes Intravenous On call to O.R.  08/14/2021 0513 07/22/2021 0806       Assessment/Plan: s/p Procedure(s): XI ROBOTIC ASSISTED LOWER ANTERIOR RESECTION, WITH BILATERAL TAP BLOCK AND INTRAOPERATIVE ASSESSMENT USING ICG (N/A) DIVERTING ILEOSTOMY (N/A) CYSTOSCOPY with FIREFLY INJECTION AND URETHERAL DILATION (N/A) post op Ileus: most likely related to pelvic infection, NG out 11/7, AXR with continued ileus on 11/17.  NPO, NGT PRN  Severe malnutrition, prolonged ileus: pt not eating well off TPN, Restarted TPN 11/17, pt tolerating min clears  High output ileostomy: will cont fiber pills but hold imodium due to ileus and vomiting.  pSBO: due to pelvic adhesions from pelvic abscess.  Pt in agreement to try NG placement today.  If unable to place, then will most likely need PEG tube  insertion  Anemia: most likely related to chronic illness and malnutrition.  Iron supplements ordered but pt did not tolerate this.  Iron levels low. IV iron given  possible anastomotic dehisscence vs abscess from previous perforated rectal cancer:   - JP drainage catheter appears to be in appropriate position, d/c Zosyn 11/14.  wbc normal.  - Rpt CT 11/20 shows increased abscess size with drain in correct position.   restarted drain flushes.  No sign of systemic infection.  Discussed with IR on 11/21.  No further targets available for additional drainage.   7 day course of abx repeated.   - CT 11/28 fluid collection more organized but slightly smaller, IR placed TG drain 11/30, Zosyn x5 days  Ambulate in hall TID, appreciate PT/OT involvement   Urinary retention: foley replaced on Mon 11/1.  foley out again 11/7, failed voiding trial 11/9 with elevated PVR.   Foley replaced, will need urology f/u as outpatient.  May be able to retry voiding trial once pelvic abscess resolved  Dispo: pt has a temporary housing plan in place at discharge in [place] with his Aunt.  TOC team consulted and working with housing authority to assist with long term housing.      LOS: 37 days    Rosario Adie 72/03/2034

## 2021-09-21 NOTE — Progress Notes (Signed)
PHARMACY - TOTAL PARENTERAL NUTRITION CONSULT NOTE   Indication: intolerance to enteral feeding  Patient Measurements: Height: 5\' 6"  (167.6 cm) Weight: 61.6 kg (135 lb 12.9 oz) IBW/kg (Calculated) : 63.8 TPN AdjBW (KG): 59.7 Body mass index is 21.92 kg/m.  Assessment:  65 yo M presents on 10/28 for eval of rectal cancer. S/P LAR and diverting ileostomy. Has been NPO for 7 days and NG tube in place with high output. Pharmacy consulted to start TPN. Stopped TPN on 11/15 but had to restart on 11/17 as patient vomited as is unable to tolerate PO.  Glucose / Insulin: No Hx DM. CBGs <664 on 14 hour cyclic TPN. No SSI required Electrolytes: All, including Corrected Calcium, WNL.   Renal: SCr WNL but continues to creep up, BUN now elevated but stable at 27 Hepatic: Albumin remains low; LFTs and Tbili WNL, TG 44(11/28) Intake / Output; MIVF:  NS @ 40 ml/hr No NGT (pt refuses), Stool 1425 mL/24 hrs. UOP 2300 mL/24 hrs, drain 65 mL/24 hrs. GI Imaging: 11/4 CT abdomen shows fluid collection, SBO, bladder thickening  11/17 KUB: Dilated small bowel loops are noted concerning for distal small bowel obstruction 11/19 CT:  Small-bowel obstruction, increase in size of gas and fluid abscess formation  11/28 CTAP: mild decrease in gas/gjuid in presacral space, no change in small bowel obstruction GI Surgeries / Procedures:  10/28: LAR/Diverting ileostomy  11/30: Successful CT-guided presacral pelvic abscess drain placement Central access: Implanted Port TPN start date: 11/5 >> 11/15. Resumed 11/17>>  Nutritional Goals:  Goal TPN rate is 85 mL/hr (provides 102 g of protein and 1958 kcals per day)  RD Assessment: Estimated Needs Total Energy Estimated Needs: 1950-2150 Total Protein Estimated Needs: 100-110g Total Fluid Estimated Needs: 2L/day  Current Nutrition:  NPO > clear liquid diet (11/25) - tolerating minimal clears TPN transitioned to 14 hour cyclic   AssessmentPlan:  Continue 14 hour  cyclic TPN hours with 1 hour ramp up/ramp down (78-157 ml/hr) providing same calories/protein as above and continue NS @ 40 ml/hr as SCr bumped after stopping MIVF Electrolytes in TPN: Na 100 mEq/L, K 30mEq/L, Ca 100mEq/L, Mg 108mEq/L, and Phos 65mmol/L. Cl:Ac 1:2 Add standard MVI and trace elements to TPN NS @ 40 ml/hr as above GIR max 6.37 mg/kg/min will need to follow LFTs and CBGs closely for a few days Will check CBGs with very sensitive sliding scale [id number] TPN labs on Mon/Thurs    Adrian Saran, PharmD, BCPS Secure Chat if ?s 09/21/2021 8:23 AM

## 2021-09-22 ENCOUNTER — Inpatient Hospital Stay (HOSPITAL_COMMUNITY): Payer: Medicare HMO

## 2021-09-22 ENCOUNTER — Other Ambulatory Visit (HOSPITAL_COMMUNITY): Payer: Self-pay

## 2021-09-22 DIAGNOSIS — K3189 Other diseases of stomach and duodenum: Secondary | ICD-10-CM | POA: Diagnosis not present

## 2021-09-22 DIAGNOSIS — K6389 Other specified diseases of intestine: Secondary | ICD-10-CM | POA: Diagnosis not present

## 2021-09-22 DIAGNOSIS — K5939 Other megacolon: Secondary | ICD-10-CM | POA: Diagnosis not present

## 2021-09-22 LAB — COMPREHENSIVE METABOLIC PANEL
ALT: 9 U/L (ref 0–44)
AST: 12 U/L — ABNORMAL LOW (ref 15–41)
Albumin: 2.9 g/dL — ABNORMAL LOW (ref 3.5–5.0)
Alkaline Phosphatase: 41 U/L (ref 38–126)
Anion gap: 8 (ref 5–15)
BUN: 26 mg/dL — ABNORMAL HIGH (ref 8–23)
CO2: 29 mmol/L (ref 22–32)
Calcium: 8.9 mg/dL (ref 8.9–10.3)
Chloride: 102 mmol/L (ref 98–111)
Creatinine, Ser: 0.96 mg/dL (ref 0.61–1.24)
GFR, Estimated: >60 mL/min (ref >60)
Glucose, Bld: 136 mg/dL — ABNORMAL HIGH (ref 70–99)
Potassium: 3.6 mmol/L (ref 3.5–5.1)
Sodium: 139 mmol/L (ref 135–145)
Total Bilirubin: 0.2 mg/dL — ABNORMAL LOW (ref 0.3–1.2)
Total Protein: 8.2 g/dL — ABNORMAL HIGH (ref 6.5–8.1)

## 2021-09-22 LAB — GLUCOSE, CAPILLARY
Glucose-Capillary: 134 mg/dL — ABNORMAL HIGH (ref 70–99)
Glucose-Capillary: 120 mg/dL — ABNORMAL HIGH (ref 70–99)
Glucose-Capillary: 117 mg/dL — ABNORMAL HIGH (ref 70–99)
Glucose-Capillary: 105 mg/dL — ABNORMAL HIGH (ref 70–99)
Glucose-Capillary: 141 mg/dL — ABNORMAL HIGH (ref 70–99)

## 2021-09-22 LAB — TRIGLYCERIDES: Triglycerides: 78 mg/dL (ref <150)

## 2021-09-22 LAB — PHOSPHORUS: Phosphorus: 3.2 mg/dL (ref 2.5–4.6)

## 2021-09-22 LAB — MAGNESIUM: Magnesium: 2.3 mg/dL (ref 1.7–2.4)

## 2021-09-22 MED ORDER — TRAVASOL 10 % IV SOLN
INTRAVENOUS | Status: AC
  Filled 2021-09-22: qty 1020

## 2021-09-22 MED ORDER — POTASSIUM CHLORIDE 10 MEQ/50ML IV SOLN
10.0000 meq | INTRAVENOUS | Status: AC
  Administered 2021-09-22 (×2): 10 meq via INTRAVENOUS
  Filled 2021-09-22 (×2): qty 50

## 2021-09-22 MED ORDER — IOHEXOL 9 MG/ML PO SOLN: 500.0000 mL | ORAL | Status: DC

## 2021-09-22 MED ORDER — IOHEXOL 9 MG/ML PO SOLN
ORAL | Status: AC
  Filled 2021-09-22: qty 1000

## 2021-09-22 NOTE — Progress Notes (Signed)
PHARMACY - TOTAL PARENTERAL NUTRITION CONSULT NOTE   Indication: intolerance to enteral feeding  Patient Measurements: Height: 5\' 6"  (167.6 cm) Weight: 61.6 kg (135 lb 12.9 oz) IBW/kg (Calculated) : 63.8 TPN AdjBW (KG): 59.7 Body mass index is 21.92 kg/m.  Assessment:  65 yo M presents on 10/28 for eval of rectal cancer. S/P LAR and diverting ileostomy. Has been NPO for 7 days and NG tube in place with high output. Pharmacy consulted to start TPN. Stopped TPN on 11/15 but had to restart on 11/17 as patient vomited as is unable to tolerate PO.  Glucose / Insulin: No Hx DM. CBGs <878 on 14 hour cyclic TPN. No SSI required Electrolytes: All, including Corrected Calcium, WNL.  K 3.6, trending down Renal: SCr WNL, BUN slightly elevated but stable at 26 Hepatic: Albumin remains low; LFTs and Tbili WNL, TG 78(12.5) Intake / Output; MIVF:  NS @ 40 ml/hr No NGT - per [facility] notes pt agreed to NGT 12/4, none recorded in Epic yet-? Unsuccessful  Stool 1136mL/24 hrs. UOP 1300 mL/24 hrs, drain 85 mL/24 hrs. GI Imaging: 11/4 CT abdomen shows fluid collection, SBO, bladder thickening  11/17 KUB: Dilated small bowel loops are noted concerning for distal small bowel obstruction 11/19 CT:  Small-bowel obstruction, increase in size of gas and fluid abscess formation  11/28 CTAP: mild decrease in gas/gjuid in presacral space, no change in small bowel obstruction GI Surgeries / Procedures:  10/28: LAR/Diverting ileostomy  11/30: Successful CT-guided presacral pelvic abscess drain placement Central access: Implanted Port TPN start date: 11/5 >> 11/15. Resumed 11/17>>  Nutritional Goals:  Goal TPN rate is 85 mL/hr (provides 102 g of protein and 1958 kcals per day)  RD Assessment: Estimated Needs Total Energy Estimated Needs: 1950-2150 Total Protein Estimated Needs: 100-110g Total Fluid Estimated Needs: 2L/day  Current Nutrition:  NPO > clear liquid diet (11/25) - tolerating minimal clears TPN  transitioned to 14 hour cyclic   AssessmentPlan:  Now: 2 runs K  Continue 14 hour cyclic TPN hours with 1 hour ramp up/ramp down (78-157 ml/hr) providing same calories/protein as above and continue NS @ 40 ml/hr as SCr bumped after stopping MIVF Electrolytes in TPN: Na 100 mEq/L, increase K 48mEq/L, Ca 32mEq/L, Mg 49mEq/L, and Phos 15mmol/L. Cl:Ac 1:2 Add standard MVI and trace elements to TPN NS @ 40 ml/hr as above GIR max 6.37 mg/kg/min will need to follow LFTs and CBGs closely for a few days Will check CBGs with very sensitive sliding scale 0300-0900-1500-2000 TPN labs on Mon/Thurs F/u ability to place NGT or PEG for enteral nutrition per [facility] plans   Eudelia Bunch, Pharm.D 09/22/2021 7:36 AM

## 2021-09-22 NOTE — Progress Notes (Signed)
PT Cancellation Note  Patient Details Name: Phillip Brooks MRN: [id number] DOB: Mar 12, 1956   Cancelled Treatment:    Reason Eval/Treat Not Completed: Patient at procedure or test/unavailable Pt reports he is awaiting transport for xray for pending procedure and politely declines mobility at this time.   Myrtis Hopping [place] 09/22/2021, 2:37 PM Jannette Spanner PT, DPT Acute Rehabilitation Services Pager: [phone] Office: [phone]

## 2021-09-22 NOTE — Consult Note (Signed)
[facility] Nurse ostomy follow up Stoma type/location: RLQ ileostomy Stomal assessment/size: 1 and 1/8 inch oval with long telescoping center measuring 7/8 inch Peristomal assessment: intact, clear Treatment options for stomal/peristomal skin: Cavillon skin barrier film and skin barrier ring Output: thin green/brown effluent Ostomy pouching: 2pc. 2 and 1/4 inch ostomy pouch ing system with skin barrier ring. Education provided: None today. Patient reports that he changed pouch with Nursing staff providing standby assist on Friday. The pouching system has a date of 12/2 on it. Enrolled patient in [facility] Discharge program: Yes, previously.  I assist with pouch change today with patient articulating all steps. He requests the Cavillon liquid barrier film today so that is why it is used. He understands from previous instructions that this step is optional and there is no skin irritation of breakdown in the peristomal area.  Supplies in room for next 2 changes, also educational booklet.  [facility] nursing team will follow, seeing once weekly while in house and will remain available to this patient, the nursing and medical teams.   Thanks, Maudie Flakes, MSN, RN, [place], Arther Abbott  Pager# [phone]

## 2021-09-22 NOTE — Progress Notes (Signed)
IR received request by Dr. Marcello Moores for venting g-tube. Dr. Anselm Pancoast reviewed recent CT scan and states that the small bowel is too distended - no window for g-tube placement. The patient requires decompression with NGT and will need repeat CT imaging for further evaluation.   Unable to reach Dr. Marcello Moores today. I have paged the on-call number for the surgery provider at [facility]. I notified the bedside RN of this information.   No IR procedure planned. Please contact IR for repeat evaluation if patient is able to tolerate NG tube decompression and repeat CT imaging has been obtained. I will keep the IR g-tube order in place for now.  Soyla Dryer, [place] [phone] 09/22/2021, 12:52 PM

## 2021-09-22 NOTE — Progress Notes (Signed)
Patient had CT abdomen today.  There continues to be severe dilatation of small bowel loops in the abdomen with small bowel situated between the anterior abdominal wall and the stomach.  There is not a window for a percutaneous gastrostomy tube.  Recommend decompression with NGT if patient can tolerate and then repeat imaging.

## 2021-09-22 NOTE — Progress Notes (Signed)
38 Days Post-Op robotic LAR, diverting ostomy  Subjective/Chief Complaint:  ambulating in hall, having ileostomy output, attempt NG again but unable to tolerate this  Vital signs in last 24 hours: Temp:  [97.5 F (36.4 C)-98.5 F (36.9 C)] 98 F (36.7 C) (12/04 2156) Pulse Rate:  [96-106] 104 (12/05 0558) Resp:  [16-18] 18 (12/05 0558) BP: (116-132)/(79-91) 116/82 (12/05 0558) SpO2:  [98 %-100 %] 99 % (12/05 0558) Last BM Date: 09/20/21  Intake/Output from previous day: 12/04 0701 - 12/05 0700 In: 2029.2 [P.O.:120; I.V.:1760; IV Piggyback:149.1] Out: 2485 [Urine:1300; Drains:85; JXBJY:7829] Intake/Output this shift: No intake/output data recorded.  General appearance: alert, cooperative, and no distress GI: distended, non tender in lower abdomen; ostomy pink with liquid stool  Incisions OK, both JP drains with purulent drainage  Lab Results:  No results for input(s): WBC, HGB, HCT, PLT in the last 72 hours.  BMET Recent Labs    09/21/21 0424 09/22/21 0427  NA 137 139  K 3.7 3.6  CL 101 102  CO2 28 29  GLUCOSE 143* 136*  BUN 26* 26*  CREATININE 0.92 0.96  CALCIUM 8.8* 8.9    PT/INR No results for input(s): LABPROT, INR in the last 72 hours. ABG No results for input(s): PHART, HCO3 in the last 72 hours.  Invalid input(s): PCO2, PO2  Studies/Results: No results found.  Anti-infectives: Anti-infectives (From admission, onward)    Start     Dose/Rate Route Frequency Ordered Stop   09/17/21 1730  piperacillin-tazobactam (ZOSYN) IVPB 3.375 g        3.375 g 12.5 mL/hr over 240 Minutes Intravenous Every 8 hours 09/17/21 1631 09/22/21 1359   09/08/21 1400  cefTRIAXone (ROCEPHIN) 2 g in sodium chloride 0.9 % 100 mL IVPB       Note to Pharmacy: Pharmacy may adjust dosing strength / duration / interval for maximal efficacy   2 g 200 mL/hr over 30 Minutes Intravenous Every 24 hours 09/08/21 1307 09/14/21 1431   08/26/21 1000  metroNIDAZOLE (FLAGYL) IVPB 500 mg   Status:  Discontinued        500 mg 100 mL/hr over 60 Minutes Intravenous Every 12 hours 08/26/21 0830 08/26/21 0910   08/26/21 1000  piperacillin-tazobactam (ZOSYN) IVPB 3.375 g  Status:  Discontinued        3.375 g 12.5 mL/hr over 240 Minutes Intravenous Every 8 hours 08/26/21 0910 09/01/21 0819   08/25/21 1030  metroNIDAZOLE (FLAGYL) tablet 500 mg  Status:  Discontinued        500 mg Per Tube Every 12 hours 08/25/21 0935 08/26/21 0830   08/25/21 1000  cefTRIAXone (ROCEPHIN) 2 g in sodium chloride 0.9 % 100 mL IVPB  Status:  Discontinued       Note to Pharmacy: Pharmacy may adjust dosing strength / duration / interval for maximal efficacy   2 g 200 mL/hr over 30 Minutes Intravenous Every 24 hours 08/25/21 0823 08/26/21 0910   08/25/21 1000  metroNIDAZOLE (FLAGYL) tablet 500 mg  Status:  Discontinued        500 mg Oral Every 12 hours 08/25/21 0837 08/25/21 0935   08/16/2021 2000  cefoTEtan (CEFOTAN) 2 g in sodium chloride 0.9 % 100 mL IVPB        2 g 200 mL/hr over 30 Minutes Intravenous Every 12 hours 08/18/2021 1647 07/19/2021 2046   07/30/2021 0600  cefoTEtan (CEFOTAN) 2 g in sodium chloride 0.9 % 100 mL IVPB        2 g 200 mL/hr  over 30 Minutes Intravenous On call to O.R. 08/12/2021 0513 07/29/2021 0806       Assessment/Plan: s/p Procedure(s): XI ROBOTIC ASSISTED LOWER ANTERIOR RESECTION, WITH BILATERAL TAP BLOCK AND INTRAOPERATIVE ASSESSMENT USING ICG (N/A) DIVERTING ILEOSTOMY (N/A) CYSTOSCOPY with FIREFLY INJECTION AND URETHERAL DILATION (N/A) post op Ileus: most likely related to pelvic infection, NG out 11/7, AXR with continued ileus on 11/17.  NPO, NGT PRN  Severe malnutrition, prolonged ileus: pt not eating well off TPN, Restarted TPN 11/17, pt tolerating min clears  High output ileostomy: will cont fiber pills but hold imodium due to ileus and vomiting.  pSBO: due to pelvic adhesions from pelvic abscess.  Pt unable to tolerate NG, will most likely need PEG tube insertion  Anemia:  most likely related to chronic illness and malnutrition.  Iron supplements ordered but pt did not tolerate this.  Iron levels low. IV iron given  possible anastomotic dehisscence vs abscess from previous perforated rectal cancer:   - JP drainage catheter appears to be in appropriate position, d/c Zosyn 11/14.  wbc normal.  - Rpt CT 11/20 shows increased abscess size with drain in correct position.   restarted drain flushes.  No sign of systemic infection.  Discussed with IR on 11/21.  No further targets available for additional drainage.   7 day course of abx repeated.   - CT 11/28 fluid collection more organized but slightly smaller, IR placed TG drain 11/30, Zosyn x5 days  Ambulate in hall TID, appreciate PT/OT involvement   Urinary retention: foley replaced on Mon 11/1.  foley out again 11/7, failed voiding trial 11/9 with elevated PVR.   Foley replaced, will need urology f/u as outpatient.  May be able to retry voiding trial once pelvic abscess resolved  Dispo: pt has a temporary housing plan in place at discharge in [place] with his Aunt.  TOC team consulted and working with housing authority to assist with long term housing.      LOS: 38 days    Rosario Adie 41/03/6062

## 2021-09-23 LAB — GLUCOSE, CAPILLARY
Glucose-Capillary: 135 mg/dL — ABNORMAL HIGH (ref 70–99)
Glucose-Capillary: 121 mg/dL — ABNORMAL HIGH (ref 70–99)
Glucose-Capillary: 111 mg/dL — ABNORMAL HIGH (ref 70–99)

## 2021-09-23 MED ORDER — TRAVASOL 10 % IV SOLN
INTRAVENOUS | Status: AC
  Filled 2021-09-23: qty 1020

## 2021-09-23 MED ORDER — METOCLOPRAMIDE HCL 5 MG/ML IJ SOLN
5.0000 mg | Freq: Four times a day (QID) | INTRAMUSCULAR | Status: DC
  Administered 2021-09-23 – 2021-10-02 (×32): 5 mg via INTRAVENOUS
  Filled 2021-09-23 (×32): qty 2

## 2021-09-23 NOTE — Progress Notes (Signed)
PHARMACY - TOTAL PARENTERAL NUTRITION CONSULT NOTE   Indication: intolerance to enteral feeding  Patient Measurements: Height: 5\' 6"  (167.6 cm) Weight: 61.6 kg (135 lb 12.9 oz) IBW/kg (Calculated) : 63.8 TPN AdjBW (KG): 59.7 Body mass index is 21.92 kg/m.  Assessment:  65 yo M presents on 10/28 for eval of rectal cancer. S/P LAR and diverting ileostomy. Has been NPO for 7 days and NG tube in place with high output. Pharmacy consulted to start TPN. Stopped TPN on 11/15 but had to restart on 11/17 as patient vomited as is unable to tolerate PO.  Glucose / Insulin: No Hx DM. CBGs <841 on 14 hour cyclic TPN. No SSI required, DC SSI/CBGs 12/6 Electrolytes: no labs today Renal: SCr WNL, BUN slightly elevated but stable at 26 (12/5)  Hepatic: Albumin remains low; LFTs and Tbili WNL, TG 78(12.5) Intake / Output; MIVF:  NS @ 40 ml/hr No NGT > Unsuccessful 12/4; IR unable to place 12/5  Stool 1156mL/24 hrs. UOP 1300 mL/24 hrs, drain 85 mL/24 hrs. GI Imaging: 11/4 CT abdomen shows fluid collection, SBO, bladder thickening  11/17 KUB: Dilated small bowel loops are noted concerning for distal small bowel obstruction 11/19 CT:  Small-bowel obstruction, increase in size of gas and fluid abscess formation  11/28 CTAP: mild decrease in gas/gjuid in presacral space, no change in small bowel obstruction 12/5 CTAP:  severe dilatation of small bowel loops in the abdomen concerning for ileus/obstruction GI Surgeries / Procedures:  10/28: LAR/Diverting ileostomy  11/30: Successful CT-guided presacral pelvic abscess drain placement Central access: Implanted Port TPN start date: 11/5 >> 11/15. Resumed 11/17>>  Nutritional Goals:  Goal TPN rate is 85 mL/hr (provides 102 g of protein and 1958 kcals per day)  RD Assessment: Estimated Needs Total Energy Estimated Needs: 1950-2150 Total Protein Estimated Needs: 100-110g Total Fluid Estimated Needs: 2L/day  Current Nutrition:  NPO > clear liquid diet  (11/25)  TPN transitioned to 14 hour cyclic  Plan:  Continue 14 hour cyclic TPN with 1 hour ramp up/ramp down (78-157 ml/hr) and continue NS @ 40 ml/hr  Electrolytes in TPN: Na 100 mEq/L, K 48mEq/L, Ca 71mEq/L, Mg 94mEq/L, and Phos 49mmol/L. Cl:Ac 1:2 Add standard MVI and trace elements to TPN NS @ 40 ml/hr as above DC SSI/CBGs  TPN labs on Mon/Thurs, BMET in AM F/u ability to place NGT or PEG for enteral nutrition per [facility] plans   Eudelia Bunch, Pharm.D 09/23/2021 8:03 AM

## 2021-09-23 NOTE — TOC Progression Note (Signed)
Transition of Care [facility]) - Progression Note    Patient Details  Name: Phillip Brooks MRN: [id number] Date of Birth: 02/04/1956  Transition of [facility] CM/SW Contact  Lennart Pall, LCSW Phone Number: 09/23/2021, 11:46 AM  Clinical Narrative:    TOC continues to follow along with pt.  He admits frustration with lengthy hospitalization but grateful for hospital staff.  DC plan continues to be home with aunt when medically ready.     Expected Discharge Plan: [place] Barriers to Discharge: Continued Medical Work up  Expected Discharge Plan and Services Expected Discharge Plan: [place] In-house Referral: Clinical Social Work     Living arrangements for the past 2 months: Single Family Home                 DME Arranged: N/A DME Agency: NA                   Social Determinants of Health (SDOH) Interventions    Readmission Risk Interventions No flowsheet data found.

## 2021-09-23 NOTE — Progress Notes (Signed)
64 Days Post-Op robotic LAR, diverting ostomy  Subjective/Chief Complaint:  ambulating in hall, having ileostomy output, IR unable to place decompressive peg  Vital signs in last 24 hours: Temp:  [97.9 F (36.6 C)-99.1 F (37.3 C)] 99.1 F (37.3 C) (12/06 0541) Pulse Rate:  [96-105] 105 (12/06 0541) Resp:  [18] 18 (12/06 0541) BP: (119-133)/(78-91) 119/81 (12/06 0541) SpO2:  [100 %] 100 % (12/06 0541) Last BM Date: 09/20/21  Intake/Output from previous day: 12/05 0701 - 12/06 0700 In: 2916.9 [P.O.:780; I.V.:2136.9] Out: 4470 [Urine:2850; Drains:270; Stool:1100; Blood:250] Intake/Output this shift: Total I/O In: 10 [I.V.:10] Out: -   General appearance: alert, cooperative, and no distress GI: distended, non tender in lower abdomen; ostomy pink with liquid stool  Incisions OK, both JP drains with purulent drainage  Lab Results:  No results for input(s): WBC, HGB, HCT, PLT in the last 72 hours.  BMET Recent Labs    09/21/21 0424 09/22/21 0427  NA 137 139  K 3.7 3.6  CL 101 102  CO2 28 29  GLUCOSE 143* 136*  BUN 26* 26*  CREATININE 0.92 0.96  CALCIUM 8.8* 8.9    PT/INR No results for input(s): LABPROT, INR in the last 72 hours. ABG No results for input(s): PHART, HCO3 in the last 72 hours.  Invalid input(s): PCO2, PO2  Studies/Results: CT ABDOMEN WO CONTRAST  Result Date: 09/22/2021 CLINICAL DATA:  Bowel obstruction suspected. EXAM: CT ABDOMEN WITHOUT CONTRAST TECHNIQUE: Multidetector CT imaging of the abdomen was performed following the standard protocol without IV contrast. COMPARISON:  None. FINDINGS: Lower chest: Bibasilar atelectasis, right greater than the left. Hepatobiliary: No focal liver abnormality is seen. No gallstones, gallbladder wall thickening, or biliary dilatation. Pancreas: Unremarkable. No pancreatic ductal dilatation or surrounding inflammatory changes. Spleen: Normal in size without focal abnormality. Adrenals/Urinary Tract: Adrenal glands  are unremarkable. Kidneys are normal, without renal calculi, focal lesion, or hydronephrosis. Stomach/Bowel: The stomach is distended with ingested food. There are multiple dilated small bowel loops with air-fluid levels. Ostomy in the right anterior abdominal wall is unchanged. Vascular/Lymphatic: No significant vascular findings are present. No enlarged abdominal or pelvic lymph nodes. Other: No abdominal wall hernia or abnormality. Partially imaged right lateral abdominal wall access drain is noted. Musculoskeletal: No fracture is seen. IMPRESSION: 1. Multiple dilated small bowel loops concerning for ileus/obstruction, unchanged. 2.  Right anterior abdominal wall ostomy is unchanged. 3.  Bibasilar subsegmental atelectasis, right greater than the left. Electronically Signed   By: Keane Police D.O.   On: 09/22/2021 16:38    Anti-infectives: Anti-infectives (From admission, onward)    Start     Dose/Rate Route Frequency Ordered Stop   09/17/21 1730  piperacillin-tazobactam (ZOSYN) IVPB 3.375 g        3.375 g 12.5 mL/hr over 240 Minutes Intravenous Every 8 hours 09/17/21 1631 09/22/21 1001   09/08/21 1400  cefTRIAXone (ROCEPHIN) 2 g in sodium chloride 0.9 % 100 mL IVPB       Note to Pharmacy: Pharmacy may adjust dosing strength / duration / interval for maximal efficacy   2 g 200 mL/hr over 30 Minutes Intravenous Every 24 hours 09/08/21 1307 09/14/21 1431   08/26/21 1000  metroNIDAZOLE (FLAGYL) IVPB 500 mg  Status:  Discontinued        500 mg 100 mL/hr over 60 Minutes Intravenous Every 12 hours 08/26/21 0830 08/26/21 0910   08/26/21 1000  piperacillin-tazobactam (ZOSYN) IVPB 3.375 g  Status:  Discontinued        3.375 g  12.5 mL/hr over 240 Minutes Intravenous Every 8 hours 08/26/21 0910 09/01/21 0819   08/25/21 1030  metroNIDAZOLE (FLAGYL) tablet 500 mg  Status:  Discontinued        500 mg Per Tube Every 12 hours 08/25/21 0935 08/26/21 0830   08/25/21 1000  cefTRIAXone (ROCEPHIN) 2 g in sodium  chloride 0.9 % 100 mL IVPB  Status:  Discontinued       Note to Pharmacy: Pharmacy may adjust dosing strength / duration / interval for maximal efficacy   2 g 200 mL/hr over 30 Minutes Intravenous Every 24 hours 08/25/21 0823 08/26/21 0910   08/25/21 1000  metroNIDAZOLE (FLAGYL) tablet 500 mg  Status:  Discontinued        500 mg Oral Every 12 hours 08/25/21 0837 08/25/21 0935   07/22/2021 2000  cefoTEtan (CEFOTAN) 2 g in sodium chloride 0.9 % 100 mL IVPB        2 g 200 mL/hr over 30 Minutes Intravenous Every 12 hours 07/27/2021 1647 08/11/2021 2046   07/28/2021 0600  cefoTEtan (CEFOTAN) 2 g in sodium chloride 0.9 % 100 mL IVPB        2 g 200 mL/hr over 30 Minutes Intravenous On call to O.R. 08/02/2021 0513 08/02/2021 0806       Assessment/Plan: s/p Procedure(s): XI ROBOTIC ASSISTED LOWER ANTERIOR RESECTION, WITH BILATERAL TAP BLOCK AND INTRAOPERATIVE ASSESSMENT USING ICG (N/A) DIVERTING ILEOSTOMY (N/A) CYSTOSCOPY with FIREFLY INJECTION AND URETHERAL DILATION (N/A) post op Ileus: most likely related to pelvic infection, NG out 11/7, AXR with continued ileus on 11/17.  NPO, NGT PRN  Severe malnutrition, prolonged ileus: pt not eating well off TPN, Restarted TPN 11/17, pt tolerating min clears  High output ileostomy: will cont fiber pills but hold imodium due to ileus and vomiting.  pSBO: due to pelvic adhesions from pelvic abscess.  Pt unable to tolerate NG, not able to place PEG tube.  Will see what PO intake pt can achieve with reglan q6h  Anemia: most likely related to chronic illness and malnutrition.  Iron supplements ordered but pt did not tolerate this.  Iron levels low. IV iron given  possible anastomotic dehisscence vs abscess from previous perforated rectal cancer:   - JP drainage catheter appears to be in appropriate position, d/c Zosyn 11/14.  wbc normal.  - Rpt CT 11/20 shows increased abscess size with drain in correct position.   restarted drain flushes.  No sign of systemic  infection.  Discussed with IR on 11/21.  No further targets available for additional drainage.   7 day course of abx repeated.   - CT 11/28 fluid collection more organized but slightly smaller, IR placed TG drain 11/30, Zosyn x5 days  Ambulate in hall TID, appreciate PT/OT involvement   Urinary retention: foley replaced on Mon 11/1.  foley out again 11/7, failed voiding trial 11/9 with elevated PVR.   Foley replaced, will need urology f/u as outpatient.  May be able to retry voiding trial once pelvic abscess resolved  Dispo: pt has a temporary housing plan in place at discharge in [place] with his Aunt.  TOC team consulted and working with housing authority to assist with long term housing.      LOS: 39 days    Phillip Brooks [id number]

## 2021-09-24 ENCOUNTER — Inpatient Hospital Stay (HOSPITAL_COMMUNITY): Payer: Medicare HMO

## 2021-09-24 ENCOUNTER — Other Ambulatory Visit (HOSPITAL_COMMUNITY): Payer: Self-pay

## 2021-09-24 LAB — GLUCOSE, CAPILLARY
Glucose-Capillary: 116 mg/dL — ABNORMAL HIGH (ref 70–99)
Glucose-Capillary: 123 mg/dL — ABNORMAL HIGH (ref 70–99)
Glucose-Capillary: 108 mg/dL — ABNORMAL HIGH (ref 70–99)
Glucose-Capillary: 111 mg/dL — ABNORMAL HIGH (ref 70–99)

## 2021-09-24 LAB — BASIC METABOLIC PANEL
Anion gap: 8 (ref 5–15)
BUN: 25 mg/dL — ABNORMAL HIGH (ref 8–23)
CO2: 27 mmol/L (ref 22–32)
Calcium: 9.1 mg/dL (ref 8.9–10.3)
Chloride: 108 mmol/L (ref 98–111)
Creatinine, Ser: 0.77 mg/dL (ref 0.61–1.24)
GFR, Estimated: >60 mL/min (ref >60)
Glucose, Bld: 130 mg/dL — ABNORMAL HIGH (ref 70–99)
Potassium: 3.8 mmol/L (ref 3.5–5.1)
Sodium: 143 mmol/L (ref 135–145)

## 2021-09-24 MED ORDER — SODIUM CHLORIDE 0.9 % IV SOLN: INTRAVENOUS | Status: DC

## 2021-09-24 MED ORDER — HYDROMORPHONE HCL 1 MG/ML IJ SOLN
0.5000 mg | Freq: Three times a day (TID) | INTRAMUSCULAR | Status: DC | PRN
  Administered 2021-09-24 – 2021-10-01 (×20): 0.5 mg via INTRAVENOUS
  Filled 2021-09-24 (×20): qty 0.5

## 2021-09-24 MED ORDER — TRAVASOL 10 % IV SOLN
INTRAVENOUS | Status: AC
  Filled 2021-09-24: qty 1020

## 2021-09-24 NOTE — Progress Notes (Signed)
PHARMACY - TOTAL PARENTERAL NUTRITION CONSULT NOTE   Indication: intolerance to enteral feeding  Patient Measurements: Height: 5\' 6"  (167.6 cm) Weight: 61.6 kg (135 lb 12.9 oz) IBW/kg (Calculated) : 63.8 TPN AdjBW (KG): 59.7 Body mass index is 21.92 kg/m.  Assessment:  65 yo M presents on 10/28 for eval of rectal cancer. S/P LAR and diverting ileostomy. Has been NPO for 7 days and NG tube in place with high output. Pharmacy consulted to start TPN. Stopped TPN on 11/15 but had to restart on 11/17 as patient vomited as is unable to tolerate PO.  Glucose / Insulin: No Hx DM. CBGs <383 on 14 hour cyclic TPN. No SSI required, DC SSI/CBGs 12/6 Electrolytes: lytes WNL Renal: SCr WNL, BUN slightly elevated but stable at 25  Hepatic: Albumin remains low; LFTs and Tbili WNL, TG 78(12.5) Intake / Output; MIVF:  NS @ 40 ml/hr No NGT > Unsuccessful 12/4; IR unable to place 12/5  Stool 1253mL/24 hrs. UOP 2650 mL/24 hrs, drain 152 mL/24 hrs. Emesis: 300 cc GI Imaging: 11/4 CT abdomen shows fluid collection, SBO, bladder thickening  11/17 KUB: Dilated small bowel loops are noted concerning for distal small bowel obstruction 11/19 CT:  Small-bowel obstruction, increase in size of gas and fluid abscess formation  11/28 CTAP: mild decrease in gas/gjuid in presacral space, no change in small bowel obstruction 12/5 CTAP:  severe dilatation of small bowel loops in the abdomen concerning for ileus/obstruction GI Surgeries / Procedures:  10/28: LAR/Diverting ileostomy  11/30: Successful CT-guided presacral pelvic abscess drain placement Central access: Implanted Port TPN start date: 11/5 >> 11/15. Resumed 11/17>>  Nutritional Goals:  Goal TPN rate is 85 mL/hr (provides 102 g of protein and 1958 kcals per day)  RD Assessment: Estimated Needs Total Energy Estimated Needs: 1950-2150 Total Protein Estimated Needs: 100-110g Total Fluid Estimated Needs: 2L/day  Current Nutrition:  NPO > clear liquid  diet (11/25)  TPN transitioned to 14 hour cyclic   Plan:  Continue 14 hour cyclic TPN with 1 hour ramp up/ramp down (78-157 ml/hr) and continue NS @ 40 ml/hr  Electrolytes in TPN: Na 100 mEq/L, K 25mEq/L, Ca 76mEq/L, Mg 64mEq/L, and Phos 62mmol/L. Cl:Ac 1:2 Add standard MVI and trace elements to TPN NS @ 40 ml/hr as above TPN labs on Mon/Thurs F/u ability to place NGT or PEG for enteral nutrition per [facility] plans   Eudelia Bunch, Pharm.D 09/24/2021 8:52 AM

## 2021-09-24 NOTE — Progress Notes (Signed)
40 Days Post-Op robotic LAR, diverting ostomy  Subjective/Chief Complaint:  ambulating in hall, having ileostomy output, IR unable to place decompressive peg, continues to have emesis, complains of bilious drainage per rectum  Vital signs in last 24 hours: Temp:  [97.7 F (36.5 C)-99.1 F (37.3 C)] 97.7 F (36.5 C) (12/07 0608) Pulse Rate:  [101-110] 102 (12/07 0608) Resp:  [16-18] 18 (12/07 0608) BP: (115-134)/(84-96) 115/96 (12/07 0608) SpO2:  [97 %-99 %] 97 % (12/07 0608) Last BM Date: 09/20/21  Intake/Output from previous day: 12/06 0701 - 12/07 0700 In: 2725.5 [P.O.:60; I.V.:2665.5] Out: 9379 [Urine:2650; Emesis/NG output:300; Drains:152; Stool:1250] Intake/Output this shift: Total I/O In: 184.1 [I.V.:184.1] Out: 250 [Stool:250]  General appearance: alert, cooperative, and no distress GI: distended, non tender in lower abdomen; ostomy pink with liquid stool  Incisions OK, both JP drains with purulent drainage  Lab Results:  No results for input(s): WBC, HGB, HCT, PLT in the last 72 hours.  BMET Recent Labs    09/22/21 0427 09/24/21 0419  NA 139 143  K 3.6 3.8  CL 102 108  CO2 29 27  GLUCOSE 136* 130*  BUN 26* 25*  CREATININE 0.96 0.77  CALCIUM 8.9 9.1    PT/INR No results for input(s): LABPROT, INR in the last 72 hours. ABG No results for input(s): PHART, HCO3 in the last 72 hours.  Invalid input(s): PCO2, PO2  Studies/Results: CT ABDOMEN WO CONTRAST  Result Date: 09/22/2021 CLINICAL DATA:  Bowel obstruction suspected. EXAM: CT ABDOMEN WITHOUT CONTRAST TECHNIQUE: Multidetector CT imaging of the abdomen was performed following the standard protocol without IV contrast. COMPARISON:  None. FINDINGS: Lower chest: Bibasilar atelectasis, right greater than the left. Hepatobiliary: No focal liver abnormality is seen. No gallstones, gallbladder wall thickening, or biliary dilatation. Pancreas: Unremarkable. No pancreatic ductal dilatation or surrounding  inflammatory changes. Spleen: Normal in size without focal abnormality. Adrenals/Urinary Tract: Adrenal glands are unremarkable. Kidneys are normal, without renal calculi, focal lesion, or hydronephrosis. Stomach/Bowel: The stomach is distended with ingested food. There are multiple dilated small bowel loops with air-fluid levels. Ostomy in the right anterior abdominal wall is unchanged. Vascular/Lymphatic: No significant vascular findings are present. No enlarged abdominal or pelvic lymph nodes. Other: No abdominal wall hernia or abnormality. Partially imaged right lateral abdominal wall access drain is noted. Musculoskeletal: No fracture is seen. IMPRESSION: 1. Multiple dilated small bowel loops concerning for ileus/obstruction, unchanged. 2.  Right anterior abdominal wall ostomy is unchanged. 3.  Bibasilar subsegmental atelectasis, right greater than the left. Electronically Signed   By: Keane Police D.O.   On: 09/22/2021 16:38    Anti-infectives: Anti-infectives (From admission, onward)    Start     Dose/Rate Route Frequency Ordered Stop   09/17/21 1730  piperacillin-tazobactam (ZOSYN) IVPB 3.375 g        3.375 g 12.5 mL/hr over 240 Minutes Intravenous Every 8 hours 09/17/21 1631 09/22/21 1001   09/08/21 1400  cefTRIAXone (ROCEPHIN) 2 g in sodium chloride 0.9 % 100 mL IVPB       Note to Pharmacy: Pharmacy may adjust dosing strength / duration / interval for maximal efficacy   2 g 200 mL/hr over 30 Minutes Intravenous Every 24 hours 09/08/21 1307 09/14/21 1431   08/26/21 1000  metroNIDAZOLE (FLAGYL) IVPB 500 mg  Status:  Discontinued        500 mg 100 mL/hr over 60 Minutes Intravenous Every 12 hours 08/26/21 0830 08/26/21 0910   08/26/21 1000  piperacillin-tazobactam (ZOSYN) IVPB 3.375 g  Status:  Discontinued        3.375 g 12.5 mL/hr over 240 Minutes Intravenous Every 8 hours 08/26/21 0910 09/01/21 0819   08/25/21 1030  metroNIDAZOLE (FLAGYL) tablet 500 mg  Status:  Discontinued        500  mg Per Tube Every 12 hours 08/25/21 0935 08/26/21 0830   08/25/21 1000  cefTRIAXone (ROCEPHIN) 2 g in sodium chloride 0.9 % 100 mL IVPB  Status:  Discontinued       Note to Pharmacy: Pharmacy may adjust dosing strength / duration / interval for maximal efficacy   2 g 200 mL/hr over 30 Minutes Intravenous Every 24 hours 08/25/21 0823 08/26/21 0910   08/25/21 1000  metroNIDAZOLE (FLAGYL) tablet 500 mg  Status:  Discontinued        500 mg Oral Every 12 hours 08/25/21 0837 08/25/21 0935   07/20/2021 2000  cefoTEtan (CEFOTAN) 2 g in sodium chloride 0.9 % 100 mL IVPB        2 g 200 mL/hr over 30 Minutes Intravenous Every 12 hours 08/08/2021 1647 08/18/2021 2046   07/31/2021 0600  cefoTEtan (CEFOTAN) 2 g in sodium chloride 0.9 % 100 mL IVPB        2 g 200 mL/hr over 30 Minutes Intravenous On call to O.R. 08/04/2021 0513 08/05/2021 0806       Assessment/Plan: s/p Procedure(s): XI ROBOTIC ASSISTED LOWER ANTERIOR RESECTION, WITH BILATERAL TAP BLOCK AND INTRAOPERATIVE ASSESSMENT USING ICG (N/A) DIVERTING ILEOSTOMY (N/A) CYSTOSCOPY with FIREFLY INJECTION AND URETHERAL DILATION (N/A) post op Ileus: most likely related to pelvic infection, NG out 11/7, AXR with continued ileus on 11/17.  NPO, NGT PRN  Severe malnutrition, prolonged ileus: pt not eating well off TPN, Restarted TPN 11/17, pt tolerating min clears  Dehydration: tachycardic with net neg output- will add back MIV fluids  High output ileostomy: will cont fiber pills but hold imodium due to ileus and vomiting.  pSBO: due to pelvic adhesions from pelvic abscess.  Pt unable to tolerate NG, not able to place PEG tube.  Will see what PO intake pt can achieve with reglan q6h  Anemia: most likely related to chronic illness and malnutrition.  Iron supplements ordered but pt did not tolerate this.  Iron levels low. IV iron given  possible anastomotic dehisscence vs abscess from previous perforated rectal cancer:   - JP drainage catheter appears to be in  appropriate position, d/c Zosyn 11/14.  wbc normal.  - Rpt CT 11/20 shows increased abscess size with drain in correct position.   restarted drain flushes.  No sign of systemic infection.  Discussed with IR on 11/21.  No further targets available for additional drainage.   7 day course of abx repeated.   - CT 11/28 fluid collection more organized but slightly smaller, IR placed TG drain 11/30, Zosyn x5 days -check GGF enema to eval for leak or fistula  Ambulate in hall TID, appreciate PT/OT involvement   Urinary retention: foley replaced on Mon 11/1.  foley out again 11/7, failed voiding trial 11/9 with elevated PVR.   Foley replaced, will need urology f/u as outpatient.  May be able to retry voiding trial once pelvic abscess resolved  Dispo: pt has a temporary housing plan in place at discharge in [place] with his Aunt.  TOC team consulted and working with housing authority to assist with long term housing.      LOS: 40 days    Rosario Adie [id number]/04/3418

## 2021-09-24 NOTE — Progress Notes (Signed)
Nutrition Follow-up  DOCUMENTATION CODES:   Severe malnutrition in context of chronic illness  INTERVENTION:   -TPN cycles management per Pharmacy  NUTRITION DIAGNOSIS:   Severe Malnutrition related to chronic illness, cancer and cancer related treatments as evidenced by severe fat depletion, severe muscle depletion.  Ongoing.  GOAL:   Patient will meet greater than or equal to 90% of their needs  Meeting with TPN.  MONITOR:   Diet advancement, Labs, Weight trends, Skin, I & O's, Other (Comment) (TPN)   ASSESSMENT:   65 year old male who presented on 10/28 for surgery. PMH of stage III rectal cancer s/p chemotherapy and radiation.  10/28 - s/p cystoscopy with balloon dilation of bulbar urethral stricture and bilateral ureteral catheterization, lower anterior colon resection with diverting ileostomy; clear liquids 10/29 - NPO 10/30 - full liquids 10/31 - NPO 11/2 - NGT placed with significant output 11/4 - CT abdomen showing fluid collection, SBO, bladder thickening 11/5 - TPN started 11/7 - NGT removed 11/9 - CLD 11/10 - FLD 11/11 - Soft diet  11/15 - TPN stopped 11/17 - emesis x2, TPN restarted 11/30 -IR placed TG drain  Patient on regular diet, not tolerating. Pt not able to have venting PEG placed per IR.  Cyclic TPN over 14 hours continues providing 1958 kcals and 102g protein.  Admission weight: 131 lbs. Last recorded weight 11/30: 135 lbs  Medications: Reglan, IV Zofran, IV Compazine  Labs reviewed: CBGs: 111-135  Diet Order:   Diet Order             Diet regular Room service appropriate? Yes; Fluid consistency: Thin  Diet effective now                   EDUCATION NEEDS:   Education needs have been addressed  Skin:  Skin Assessment: Skin Integrity Issues: Skin Integrity Issues:: Incisions Incisions: abdomen  Last BM:  12/7 -ileostomy  Height:   Ht Readings from Last 1 Encounters:  08/16/2021 5\' 6"  (1.676 m)    Weight:   Wt  Readings from Last 1 Encounters:  09/17/21 61.6 kg    BMI:  Body mass index is 21.92 kg/m.  Estimated Nutritional Needs:   Kcal:  1950-2150  Protein:  100-110g  Fluid:  2L/day   Clayton Bibles, MS, RD, LDN Inpatient Clinical Dietitian Contact information available via Amion

## 2021-09-24 NOTE — Progress Notes (Signed)
Pt has implanted port that was verified on Xray and is verified for CT also has Powerloc tubing. Verified blood return and flushed this shift.

## 2021-09-25 ENCOUNTER — Inpatient Hospital Stay (HOSPITAL_COMMUNITY): Payer: Medicare HMO

## 2021-09-25 LAB — COMPREHENSIVE METABOLIC PANEL
ALT: 10 U/L (ref 0–44)
AST: 14 U/L — ABNORMAL LOW (ref 15–41)
Albumin: 2.9 g/dL — ABNORMAL LOW (ref 3.5–5.0)
Alkaline Phosphatase: 40 U/L (ref 38–126)
Anion gap: 8 (ref 5–15)
BUN: 26 mg/dL — ABNORMAL HIGH (ref 8–23)
CO2: 26 mmol/L (ref 22–32)
Calcium: 9 mg/dL (ref 8.9–10.3)
Chloride: 109 mmol/L (ref 98–111)
Creatinine, Ser: 0.77 mg/dL (ref 0.61–1.24)
GFR, Estimated: >60 mL/min (ref >60)
Glucose, Bld: 143 mg/dL — ABNORMAL HIGH (ref 70–99)
Potassium: 3.8 mmol/L (ref 3.5–5.1)
Sodium: 143 mmol/L (ref 135–145)
Total Bilirubin: 0.3 mg/dL (ref 0.3–1.2)
Total Protein: 8.2 g/dL — ABNORMAL HIGH (ref 6.5–8.1)

## 2021-09-25 LAB — PHOSPHORUS: Phosphorus: 2.9 mg/dL (ref 2.5–4.6)

## 2021-09-25 LAB — MAGNESIUM: Magnesium: 2.4 mg/dL (ref 1.7–2.4)

## 2021-09-25 MED ORDER — TRAVASOL 10 % IV SOLN
INTRAVENOUS | Status: AC
  Filled 2021-09-25: qty 1020

## 2021-09-25 MED ORDER — KETOROLAC TROMETHAMINE 30 MG/ML IJ SOLN
15.0000 mg | Freq: Four times a day (QID) | INTRAMUSCULAR | Status: AC | PRN
  Administered 2021-09-25 – 2021-09-30 (×12): 15 mg via INTRAVENOUS
  Filled 2021-09-25 (×12): qty 1

## 2021-09-25 NOTE — Progress Notes (Signed)
PT Cancellation Note  Patient Details Name: Phillip Brooks MRN: [id number] DOB: 18-Jan-1956   Cancelled Treatment:    Reason Eval/Treat Not Completed: Other (comment) Pt declined PT stated he just finished walking with the nursing staff. Will attempt again at a later date.    Lelon Mast 09/25/2021, 11:51 AM

## 2021-09-25 NOTE — Progress Notes (Signed)
41 Days Post-Op robotic LAR, diverting ostomy  Subjective/Chief Complaint:  ambulating in hall, having ileostomy output, displaying signs of clinical narcotic addiction   Vital signs in last 24 hours: Temp:  [97.9 F (36.6 C)-98.9 F (37.2 C)] 97.9 F (36.6 C) (12/08 1257) Pulse Rate:  [99-108] 107 (12/08 1257) Resp:  [18] 18 (12/07 2036) BP: (124-134)/(81-88) 131/84 (12/08 1257) SpO2:  [97 %-100 %] 100 % (12/08 1257) Weight:  [63.3 kg] 63.3 kg (12/08 0500) Last BM Date: 09/20/21  Intake/Output from previous day: 12/07 0701 - 12/08 0700 In: 3210.8 [P.O.:240; I.V.:2960.8] Out: 4575 [MWNUU:7253; Drains:125; GUYQI:3474] Intake/Output this shift: Total I/O In: -  Out: 120 [Drains:70; Stool:50]  General appearance: alert, cooperative, and no distress GI: distended, non tender in lower abdomen; ostomy pink with liquid stool  Incisions OK, both JP drains with purulent drainage  Lab Results:  No results for input(s): WBC, HGB, HCT, PLT in the last 72 hours.  BMET Recent Labs    09/24/21 0419 09/25/21 0441  NA 143 143  K 3.8 3.8  CL 108 109  CO2 27 26  GLUCOSE 130* 143*  BUN 25* 26*  CREATININE 0.77 0.77  CALCIUM 9.1 9.0    PT/INR No results for input(s): LABPROT, INR in the last 72 hours. ABG No results for input(s): PHART, HCO3 in the last 72 hours.  Invalid input(s): PCO2, PO2  Studies/Results: DG BE (COLON)W SINGLE CM (SOL OR THIN BA)  Result Date: 09/25/2021 CLINICAL DATA:  Evaluate for anastomotic leak.  History of APR. EXAM: WATER SOLUBLE CONTRAST ENEMA TECHNIQUE: Contrast enema was performed using water-soluble contrast material per rectum. FLUOROSCOPY TIME:  Fluoroscopy Time:  2 minutes and 12 seconds Radiation Exposure Index (if provided by the fluoroscopic device): 42.9 mGy Number of Acquired Spot Images: 0 COMPARISON:  CT scan 09/15/2021 FINDINGS: Extravasation of contrast noted along the right-side of the anastomosis in the region of the right-sided  drainage 2. Possible second area of extravasation noted on the left side although this could communicate with the other area. The rest of the colon is unremarkable. IMPRESSION: Extravasation of contrast suggesting an anastomotic leak. Electronically Signed   By: Marijo Sanes M.D.   On: 09/25/2021 10:05    Anti-infectives: Anti-infectives (From admission, onward)    Start     Dose/Rate Route Frequency Ordered Stop   09/17/21 1730  piperacillin-tazobactam (ZOSYN) IVPB 3.375 g        3.375 g 12.5 mL/hr over 240 Minutes Intravenous Every 8 hours 09/17/21 1631 09/22/21 1001   09/08/21 1400  cefTRIAXone (ROCEPHIN) 2 g in sodium chloride 0.9 % 100 mL IVPB       Note to Pharmacy: Pharmacy may adjust dosing strength / duration / interval for maximal efficacy   2 g 200 mL/hr over 30 Minutes Intravenous Every 24 hours 09/08/21 1307 09/14/21 1431   08/26/21 1000  metroNIDAZOLE (FLAGYL) IVPB 500 mg  Status:  Discontinued        500 mg 100 mL/hr over 60 Minutes Intravenous Every 12 hours 08/26/21 0830 08/26/21 0910   08/26/21 1000  piperacillin-tazobactam (ZOSYN) IVPB 3.375 g  Status:  Discontinued        3.375 g 12.5 mL/hr over 240 Minutes Intravenous Every 8 hours 08/26/21 0910 09/01/21 0819   08/25/21 1030  metroNIDAZOLE (FLAGYL) tablet 500 mg  Status:  Discontinued        500 mg Per Tube Every 12 hours 08/25/21 0935 08/26/21 0830   08/25/21 1000  cefTRIAXone (ROCEPHIN) 2 g  in sodium chloride 0.9 % 100 mL IVPB  Status:  Discontinued       Note to Pharmacy: Pharmacy may adjust dosing strength / duration / interval for maximal efficacy   2 g 200 mL/hr over 30 Minutes Intravenous Every 24 hours 08/25/21 0823 08/26/21 0910   08/25/21 1000  metroNIDAZOLE (FLAGYL) tablet 500 mg  Status:  Discontinued        500 mg Oral Every 12 hours 08/25/21 0837 08/25/21 0935   07/26/2021 2000  cefoTEtan (CEFOTAN) 2 g in sodium chloride 0.9 % 100 mL IVPB        2 g 200 mL/hr over 30 Minutes Intravenous Every 12 hours  08/01/2021 1647 08/18/2021 2046   07/24/2021 0600  cefoTEtan (CEFOTAN) 2 g in sodium chloride 0.9 % 100 mL IVPB        2 g 200 mL/hr over 30 Minutes Intravenous On call to O.R. 08/01/2021 0513 08/13/2021 0806       Assessment/Plan: s/p Procedure(s): XI ROBOTIC ASSISTED LOWER ANTERIOR RESECTION, WITH BILATERAL TAP BLOCK AND INTRAOPERATIVE ASSESSMENT USING ICG (N/A) DIVERTING ILEOSTOMY (N/A) CYSTOSCOPY with FIREFLY INJECTION AND URETHERAL DILATION (N/A) post op Ileus: most likely related to pelvic infection, NG out 11/7, AXR with continued ileus on 11/17.  PO diet as tolerated, NGT PRN  Severe malnutrition, prolonged ileus: pt not eating well enough to stop TPN  Dehydration: cont MIV due to high output ileostomy  High output ileostomy: will cont fiber pills but hold imodium due to ileus and vomiting.  pSBO: due to pelvic adhesions from pelvic abscess.  Pt unable to tolerate NG, IR not able to place decompressive PEG tube.  Will see what PO intake pt can achieve with reglan q6h  Anemia: most likely related to chronic illness and malnutrition.  Iron supplements ordered but pt did not tolerate this.  Iron levels low. IV iron given  anastomotic dehiscence  - JP drainage catheter appears to be in appropriate position, Zosyn d/c'd 11/14.  wbc normal.  - Rpt CT 11/20 shows increased abscess size with drain in correct position.   restarted drain flushes.  No sign of systemic infection.  Discussed with IR on 11/21.  No further targets available for additional drainage.   7 day course of abx repeated.   - CT 11/28 fluid collection more organized but slightly smaller, IR placed TG drain 11/30, Zosyn x5 days -GGF enema shows anastomotic leak, controlled with drain  Physical deconditioning: Ambulate in hall TID, appreciate PT/OT involvement   Narcotic dependence: wean dilaudid slowly  Urinary retention: foley replaced on Mon 11/1.  foley out again 11/7, failed voiding trial 11/9 with elevated PVR.   Foley  replaced, will need urology f/u as outpatient.  May be able to retry voiding trial once pelvic abscess resolved  Dispo: pt has a temporary housing plan in place at discharge in [place] with his Aunt.  TOC team consulted and working with housing authority to assist with long term housing.      LOS: 41 days    Phillip Brooks [birth year]

## 2021-09-25 NOTE — Progress Notes (Signed)
PHARMACY - TOTAL PARENTERAL NUTRITION CONSULT NOTE   Indication: intolerance to enteral feeding  Patient Measurements: Height: 5\' 6"  (167.6 cm) Weight: 63.3 kg (139 lb 8.8 oz) IBW/kg (Calculated) : 63.8 TPN AdjBW (KG): 59.7 Body mass index is 22.52 kg/m.  Assessment:  65 yo M presents on 10/28 for eval of rectal cancer. S/P LAR and diverting ileostomy. Has been NPO for 7 days and NG tube in place with high output. Pharmacy consulted to start TPN. Stopped TPN on 11/15 but had to restart on 11/17 as patient vomited as is unable to tolerate PO.  Glucose / Insulin: No Hx DM. CBGs <725 on 14 hour cyclic TPN. No SSI required, DC SSI/CBGs 12/6 Electrolytes: lytes WNL Renal: SCr WNL, BUN slightly elevated but stable at 26 Hepatic: Albumin remains low; LFTs and Tbili WNL, TG 78(12.5) Intake / Output; MIVF:  NS increased from 40 to 50 ml/hr 12 17 by CCS No NGT > Unsuccessful 12/4; IR unable to place 12/5  Stool 2500/24 hrs. UOP 1950 mL/24 hrs, drain 125 mL/24 hrs.  GI Imaging: 11/4 CT abdomen shows fluid collection, SBO, bladder thickening  11/17 KUB: Dilated small bowel loops are noted concerning for distal small bowel obstruction 11/19 CT:  Small-bowel obstruction, increase in size of gas and fluid abscess formation  11/28 CTAP: mild decrease in gas/gjuid in presacral space, no change in small bowel obstruction 12/5 CTAP:  severe dilatation of small bowel loops in the abdomen concerning for ileus/obstruction GI Surgeries / Procedures:  10/28: LAR/Diverting ileostomy  11/30: Successful CT-guided presacral pelvic abscess drain placement Central access: Implanted Port TPN start date: 11/5 >> 11/15. Resumed 11/17>>  Nutritional Goals:  Goal TPN rate is 85 mL/hr (provides 102 g of protein and 1958 kcals per day)  RD Assessment: Estimated Needs Total Energy Estimated Needs: 1950-2150 Total Protein Estimated Needs: 100-110g Total Fluid Estimated Needs: 2L/day  Current Nutrition:  NPO >  clear liquid diet (11/25)  TPN transitioned to 14 hour cyclic   Plan:  Continue 14 hour cyclic TPN with 1 hour ramp up/ramp down (78-157 ml/hr)  NS increased to 50 ml/hr 12/7 by CCS Electrolytes in TPN: Na 100 mEq/L, K 72mEq/L, Ca 24mEq/L, Mg 32mEq/L, and Phos 53mmol/L. Cl:Ac 1:2 Add standard MVI and trace elements to TPN NS @ 40 ml/hr as above TPN labs on Mon/Thurs F/u ability to place NGT or PEG for enteral nutrition per [facility] plans.  Eudelia Bunch, Pharm.D 09/25/2021 8:53 AM

## 2021-09-25 NOTE — Progress Notes (Signed)
Requested surgical orders with Dr. Marcello Moores office.

## 2021-09-25 NOTE — Progress Notes (Signed)
Physical Therapy Discharge Patient Details Name: Phillip Brooks MRN: [id number] DOB: [birth year] Today's Date: 09/25/2021 Time:  -     Patient discharged from PT services secondary to goals met and no further PT needs identified, ambulating with mobility services and nursing staff.  Please see latest therapy progress note for current level of functioning and progress toward goals.      GP     Claretha Cooper 09/25/2021, 4:52 PM  [place] Pager [phone] Office [phone]

## 2021-09-26 MED ORDER — TRAVASOL 10 % IV SOLN
INTRAVENOUS | Status: AC
  Filled 2021-09-26: qty 1020

## 2021-09-26 NOTE — Progress Notes (Signed)
Referring Physician(s): Dr. Joyice Faster  Supervising Physician: Markus Daft  Patient Status:  [facility] - In-pt  Chief Complaint:  Post op presacral pelvic abscess s/p left transgluteal approach abscess drain placed on 11.30.22 by Dr. Annamaria Boots  Subjective:  Patient states that he is feeling better but does not have his appetite back to "where it should be" Patient states that he does not want to go home until his appetite is back to baseline.  Allergies: Patient has no known allergies.  Medications: Prior to Admission medications   Medication Sig Start Date End Date Taking? Authorizing Provider  acetaminophen (TYLENOL) 650 MG CR tablet Take 650 mg by mouth every 8 (eight) hours as needed for pain.   Yes [provider]  BISACODYL 5 MG EC tablet Take 20 mg by mouth once as needed for constipation. 07/21/21  Yes [provider]  oxyCODONE (OXY IR/ROXICODONE) 5 MG immediate release tablet Take 1 tablet (5 mg total) by mouth every 6 (six) hours as needed for severe pain. 08/11/21  Yes Owens Shark, NP  polyethylene glycol powder (GLYCOLAX/MIRALAX) 17 GM/SCOOP powder Take 238 g by mouth once as needed. Colonoscopy prep 07/21/21  Yes [provider]  capecitabine (XELODA) 500 MG tablet Take 3 tablets (1,500 mg total) by mouth 2 (two) times daily after a meal. Take Monday-Friday. Take only on days of radiation. Patient not taking: No sig reported 04/18/21   Ladell Pier, MD  metroNIDAZOLE (FLAGYL) 500 MG tablet Take 1,000 mg by mouth 3 (three) times daily. 2 day supply 07/21/21   [provider]  neomycin (MYCIFRADIN) 500 MG tablet Take 1,000 mg by mouth 3 (three) times daily. 2 day supply 07/21/21   [provider]  traMADol (ULTRAM) 50 MG tablet Take 50 mg by mouth every 12 (twelve) hours as needed for moderate pain. 06/25/21   [provider]     Vital Signs: BP 130/81 (BP Location: Right Arm)   Pulse 100   Temp 97.9 F (36.6 C) (Oral)    Resp 20   Ht 5\' 6"  (1.676 m)   Wt 139 lb 8.8 oz (63.3 kg)   SpO2 99%   BMI 22.52 kg/m   Physical Exam Vitals and nursing note reviewed.  Constitutional:      Appearance: He is well-developed.  HENT:     Head: Normocephalic.  Pulmonary:     Effort: Pulmonary effort is normal.  Abdominal:     Comments: Positive left transgluteal approach pelvic abscess drain to suction.  Site is unremarkable with no erythema, edema, tenderness, bleeding or drainage noted at exit site. Suture and stat lock in place. Dressing is clean dry and intact. 25 ml of  purulent malodorous colored fluid noted in bulb suction device. Drain is able to be flushed easily.    Musculoskeletal:        General: Normal range of motion.     Cervical back: Normal range of motion.  Skin:    General: Skin is dry.  Neurological:     Mental Status: He is alert and oriented to person, place, and time.    Imaging: CT ABDOMEN WO CONTRAST  Result Date: 09/22/2021 CLINICAL DATA:  Bowel obstruction suspected. EXAM: CT ABDOMEN WITHOUT CONTRAST TECHNIQUE: Multidetector CT imaging of the abdomen was performed following the standard protocol without IV contrast. COMPARISON:  None. FINDINGS: Lower chest: Bibasilar atelectasis, right greater than the left. Hepatobiliary: No focal liver abnormality is seen. No gallstones, gallbladder wall thickening, or biliary  dilatation. Pancreas: Unremarkable. No pancreatic ductal dilatation or surrounding inflammatory changes. Spleen: Normal in size without focal abnormality. Adrenals/Urinary Tract: Adrenal glands are unremarkable. Kidneys are normal, without renal calculi, focal lesion, or hydronephrosis. Stomach/Bowel: The stomach is distended with ingested food. There are multiple dilated small bowel loops with air-fluid levels. Ostomy in the right anterior abdominal wall is unchanged. Vascular/Lymphatic: No significant vascular findings are present. No enlarged abdominal or pelvic lymph nodes. Other:  No abdominal wall hernia or abnormality. Partially imaged right lateral abdominal wall access drain is noted. Musculoskeletal: No fracture is seen. IMPRESSION: 1. Multiple dilated small bowel loops concerning for ileus/obstruction, unchanged. 2.  Right anterior abdominal wall ostomy is unchanged. 3.  Bibasilar subsegmental atelectasis, right greater than the left. Electronically Signed   By: Keane Police D.O.   On: 09/22/2021 16:38   DG BE (COLON)W SINGLE CM (SOL OR THIN BA)  Result Date: 09/25/2021 CLINICAL DATA:  Evaluate for anastomotic leak.  History of APR. EXAM: WATER SOLUBLE CONTRAST ENEMA TECHNIQUE: Contrast enema was performed using water-soluble contrast material per rectum. FLUOROSCOPY TIME:  Fluoroscopy Time:  2 minutes and 12 seconds Radiation Exposure Index (if provided by the fluoroscopic device): 42.9 mGy Number of Acquired Spot Images: 0 COMPARISON:  CT scan 09/15/2021 FINDINGS: Extravasation of contrast noted along the right-side of the anastomosis in the region of the right-sided drainage 2. Possible second area of extravasation noted on the left side although this could communicate with the other area. The rest of the colon is unremarkable. IMPRESSION: Extravasation of contrast suggesting an anastomotic leak. Electronically Signed   By: Marijo Sanes M.D.   On: 09/25/2021 10:05    Labs:  CBC: Recent Labs    08/26/21 0416 08/28/21 0430 09/01/21 0417 09/05/21 0745  WBC 18.1* 11.8* 9.9 7.2  HGB 7.5* 7.3* 7.1* 8.0*  HCT 24.1* 23.9* 23.1* 25.0*  PLT 666* 635* 707* 718*    COAGS: Recent Labs    12/06/20 1706  INR 1.1*    BMP: Recent Labs    09/21/21 0424 09/22/21 0427 09/24/21 0419 09/25/21 0441  NA 137 139 143 143  K 3.7 3.6 3.8 3.8  CL 101 102 108 109  CO2 28 29 27 26   GLUCOSE 143* 136* 130* 143*  BUN 26* 26* 25* 26*  CALCIUM 8.8* 8.9 9.1 9.0  CREATININE 0.92 0.96 0.77 0.77  GFRNONAA >60 >60 >60 >60    LIVER FUNCTION TESTS: Recent Labs    09/20/21 0521  09/21/21 0424 09/22/21 0427 09/25/21 0441  BILITOT 0.5 0.2* 0.2* 0.3  AST 12* 11* 12* 14*  ALT 11 10 9 10   ALKPHOS 42 40 41 40  PROT 7.7 7.5 8.2* 8.2*  ALBUMIN 2.7* 2.7* 2.9* 2.9*    Assessment and Plan:  65 y.o male inpatient. Hisotry of  s/p lower anterior resection with diverting ostomy on 10.28.22. Hospital course complicated by a presacral pelvic abscess. IR placed a left transgluteal approach abscess drain placed on 11.30.22. Cultures shows abundant e coli. Gram stain shows moderate WBC with gram positive and negative rods and gram positive cocci. WBC 7.2. Patient is afebrile. Mr. Pantoja has completed a course of Zosyn.   Drain Location: left transgluteal approach presacral abscess drain Size: Fr size: 10 Fr Date of placement: 11.30.22  Currently to: Drain collection device: suction bulb 24 hour output:  Output by Drain (mL) 09/24/21 0701 - 09/24/21 1900 09/24/21 1901 - 09/25/21 0700 09/25/21 0701 - 09/25/21 1900 09/25/21 1901 - 09/26/21 0700 09/26/21 0701 -  09/26/21 1639  Closed System Drain Right RLQ Bulb (JP) 19 Fr. 0 0 0 120 0  Closed System Drain 1 Left Buttock Bulb (JP) 10 Fr. 55 70 180 10 30  25  ml of purulent malodorous output noted to be in the JP drain.  Interval imaging/drain manipulation:  CT abd on 12.5.22 and a DG BE colon that shows an anastomotic leak  Current examination: Flushes/aspirates easily.  Insertion site unremarkable. Suture and stat lock in place. Dressed appropriately.   Plan: Continue TID flushes with 5 cc NS. Record output Q shift. Dressing changes QD or PRN if soiled.  Call IR APP or on call IR MD if difficulty flushing or sudden change in drain output.  Repeat imaging/possible drain injection once output < 10 mL/QD (excluding flush material.)  Discharge planning: Please contact IR APP or on call IR MD prior to patient d/c to ensure appropriate follow up plans are in place. Typically patient will follow up with IR clinic 10-14 days post  d/c for repeat imaging/possible drain injection. IR scheduler will contact patient with date/time of appointment. Patient will need to flush drain QD with 5 cc NS, record output QD, dressing changes every 2-3 days or earlier if soiled.   IR will continue to follow - please call with questions or concerns.   Electronically Signed: Jacqualine Mau, NP 09/26/2021, 12:00 PM   I spent a total of 15 Minutes at the patient's bedside AND on the patient's hospital floor or unit, greater than 50% of which was counseling/coordinating care for presacral pelvic abscess drain

## 2021-09-26 NOTE — Progress Notes (Signed)
PHARMACY - TOTAL PARENTERAL NUTRITION CONSULT NOTE   Indication: intolerance to enteral feeding  Patient Measurements: Height: 5\' 6"  (167.6 cm) Weight: 63.3 kg (139 lb 8.8 oz) IBW/kg (Calculated) : 63.8 TPN AdjBW (KG): 59.7 Body mass index is 22.52 kg/m.  Assessment:  65 yo M presents on 10/28 for eval of rectal cancer. S/P LAR and diverting ileostomy. Has been NPO for 7 days and NG tube in place with high output. Pharmacy consulted to start TPN. Stopped TPN on 11/15 but had to restart on 11/17 as patient vomited as is unable to tolerate PO.  Glucose / Insulin: No Hx DM. CBGs <275 on 14 hour cyclic TPN. No SSI required, DC SSI/CBGs 12/6 Electrolytes: lytes WNL on 12/8 Renal: SCr WNL, BUN slightly elevated but stable at 26 (12/8)  Hepatic: Albumin remains low; LFTs and Tbili WNL, TG 78(12.5) Intake / Output; MIVF:  NS increased from 40 to 50 ml/hr 12 17 by [facility] No NGT > Unsuccessful 12/4; IR unable to place 12/5  Stool 550/24 hrs. UOP 775 mL/24 hrs, drain 310 mL/24 hrs.  No Po intake recorded in days GI Imaging: 11/4 CT abdomen shows fluid collection, SBO, bladder thickening  11/17 KUB: Dilated small bowel loops are noted concerning for distal small bowel obstruction 11/19 CT:  Small-bowel obstruction, increase in size of gas and fluid abscess formation  11/28 CTAP: mild decrease in gas/gjuid in presacral space, no change in small bowel obstruction 12/5 CTAP:  severe dilatation of small bowel loops in the abdomen concerning for ileus/obstruction 12/7 GGF enema: shows anastomotic leak, controlled with drain GI Surgeries / Procedures:  10/28: LAR/Diverting ileostomy  11/30: Successful CT-guided presacral pelvic abscess drain placement Central access: Implanted Port TPN start date: 11/5 >> 11/15. Resumed 11/17>>  Nutritional Goals:  Goal TPN rate is 85 mL/hr (provides 102 g of protein and 1958 kcals per day)  RD Assessment: Estimated Needs Total Energy Estimated Needs:  1950-2150 Total Protein Estimated Needs: 100-110g Total Fluid Estimated Needs: 2L/day  Current Nutrition:  NPO > clear liquid diet (11/25)  TPN transitioned to 14 hour cyclic   Plan:  Continue 14 hour cyclic TPN with 1 hour ramp up/ramp down (78-157 ml/hr)  NS increased to 50 ml/hr 12/7 by CCS Electrolytes in TPN: Na 100 mEq/L, K 10mEq/L, Ca 28mEq/L, Mg 53mEq/L, and Phos 45mmol/L. Cl:Ac 1:2 Add standard MVI and trace elements to TPN NS @ 40 ml/hr as above TPN labs on Mon/Thurs F/u PO intake per [facility] plans.  Pt unable to tolerate NG, IR not able to place decompressive PEG tube.  Eudelia Bunch, Pharm.D 09/26/2021 7:36 AM

## 2021-09-26 NOTE — Progress Notes (Signed)
42 Days Post-Op robotic LAR, diverting ostomy  Subjective/Chief Complaint:  ambulating in hall, having ileostomy output, displaying signs of clinical narcotic addiction, requesting additional pain meds to sleep and "calm nerves"   Vital signs in last 24 hours: Temp:  [97.9 F (36.6 C)-98 F (36.7 C)] 97.9 F (36.6 C) (12/09 0448) Pulse Rate:  [100-110] 100 (12/09 0448) Resp:  [18-20] 20 (12/09 0448) BP: (130-141)/(81-89) 130/81 (12/09 0448) SpO2:  [99 %-100 %] 99 % (12/09 0448) Last BM Date: 09/26/21  Intake/Output from previous day: 12/08 0701 - 12/09 0700 In: 3205.3 [P.O.:240; I.V.:2955.3] Out: 1635 [Urine:775; Drains:310; Stool:550] Intake/Output this shift: Total I/O In: 120 [P.O.:120] Out: 350 [Urine:200; Stool:150]  General appearance: alert, cooperative, and no distress GI: distended, non tender in lower abdomen; ostomy pink with liquid stool  Incisions OK, both JP drains with less purulent drainage  Lab Results:  No results for input(s): WBC, HGB, HCT, PLT in the last 72 hours.  BMET Recent Labs    09/24/21 0419 09/25/21 0441  NA 143 143  K 3.8 3.8  CL 108 109  CO2 27 26  GLUCOSE 130* 143*  BUN 25* 26*  CREATININE 0.77 0.77  CALCIUM 9.1 9.0    PT/INR No results for input(s): LABPROT, INR in the last 72 hours. ABG No results for input(s): PHART, HCO3 in the last 72 hours.  Invalid input(s): PCO2, PO2  Studies/Results: DG BE (COLON)W SINGLE CM (SOL OR THIN BA)  Result Date: 09/25/2021 CLINICAL DATA:  Evaluate for anastomotic leak.  History of APR. EXAM: WATER SOLUBLE CONTRAST ENEMA TECHNIQUE: Contrast enema was performed using water-soluble contrast material per rectum. FLUOROSCOPY TIME:  Fluoroscopy Time:  2 minutes and 12 seconds Radiation Exposure Index (if provided by the fluoroscopic device): 42.9 mGy Number of Acquired Spot Images: 0 COMPARISON:  CT scan 09/15/2021 FINDINGS: Extravasation of contrast noted along the right-side of the anastomosis  in the region of the right-sided drainage 2. Possible second area of extravasation noted on the left side although this could communicate with the other area. The rest of the colon is unremarkable. IMPRESSION: Extravasation of contrast suggesting an anastomotic leak. Electronically Signed   By: Marijo Sanes M.D.   On: 09/25/2021 10:05    Anti-infectives: Anti-infectives (From admission, onward)    Start     Dose/Rate Route Frequency Ordered Stop   09/17/21 1730  piperacillin-tazobactam (ZOSYN) IVPB 3.375 g        3.375 g 12.5 mL/hr over 240 Minutes Intravenous Every 8 hours 09/17/21 1631 09/22/21 1001   09/08/21 1400  cefTRIAXone (ROCEPHIN) 2 g in sodium chloride 0.9 % 100 mL IVPB       Note to Pharmacy: Pharmacy may adjust dosing strength / duration / interval for maximal efficacy   2 g 200 mL/hr over 30 Minutes Intravenous Every 24 hours 09/08/21 1307 09/14/21 1431   08/26/21 1000  metroNIDAZOLE (FLAGYL) IVPB 500 mg  Status:  Discontinued        500 mg 100 mL/hr over 60 Minutes Intravenous Every 12 hours 08/26/21 0830 08/26/21 0910   08/26/21 1000  piperacillin-tazobactam (ZOSYN) IVPB 3.375 g  Status:  Discontinued        3.375 g 12.5 mL/hr over 240 Minutes Intravenous Every 8 hours 08/26/21 0910 09/01/21 0819   08/25/21 1030  metroNIDAZOLE (FLAGYL) tablet 500 mg  Status:  Discontinued        500 mg Per Tube Every 12 hours 08/25/21 0935 08/26/21 0830   08/25/21 1000  cefTRIAXone (ROCEPHIN)  2 g in sodium chloride 0.9 % 100 mL IVPB  Status:  Discontinued       Note to Pharmacy: Pharmacy may adjust dosing strength / duration / interval for maximal efficacy   2 g 200 mL/hr over 30 Minutes Intravenous Every 24 hours 08/25/21 0823 08/26/21 0910   08/25/21 1000  metroNIDAZOLE (FLAGYL) tablet 500 mg  Status:  Discontinued        500 mg Oral Every 12 hours 08/25/21 0837 08/25/21 0935   08/01/2021 2000  cefoTEtan (CEFOTAN) 2 g in sodium chloride 0.9 % 100 mL IVPB        2 g 200 mL/hr over 30  Minutes Intravenous Every 12 hours 07/27/2021 1647 08/17/2021 2046   07/21/2021 0600  cefoTEtan (CEFOTAN) 2 g in sodium chloride 0.9 % 100 mL IVPB        2 g 200 mL/hr over 30 Minutes Intravenous On call to O.R. 08/13/2021 0513 08/16/2021 0806       Assessment/Plan: s/p Procedure(s): XI ROBOTIC ASSISTED LOWER ANTERIOR RESECTION, WITH BILATERAL TAP BLOCK AND INTRAOPERATIVE ASSESSMENT USING ICG (N/A) DIVERTING ILEOSTOMY (N/A) CYSTOSCOPY with FIREFLY INJECTION AND URETHERAL DILATION (N/A) Ileus: most likely related to pelvic infection and small bowel adhesion in the pelvis, NG out 11/7, AXR with continued ileus on 11/17.  Pt refusing NG replacement.  PO diet as tolerated, NGT PRN  Severe malnutrition, prolonged ileus: pt not eating well enough to stop TPN  Dehydration: cont MIV due to high output ileostomy  High output ileostomy: will cont fiber pills but hold imodium due to ileus and vomiting.  pSBO: due to pelvic adhesions from pelvic abscess.  Pt unable to tolerate NG, IR not able to place, pt unwilling to undergo NG placement in fluoro, IR unable to place decompressive PEG tube.  Will see what PO intake pt can achieve.  Cont reglan q6h  Anemia: most likely related to chronic illness and malnutrition.  Iron supplements ordered but pt did not tolerate this.  Iron levels low.  IV iron given   anastomotic dehiscence  - JP drainage catheter appears to be in appropriate position, Zosyn d/c'd 11/14.  wbc normal.  - Rpt CT 11/20 shows increased abscess size with drain in correct position.   restarted drain flushes.  No sign of systemic infection.  Discussed with IR on 11/21.  No further targets available for additional drainage.   7 day course of abx repeated.   - CT 11/28 fluid collection more organized but slightly smaller, IR placed TG drain 11/30, Zosyn x5 days -12/8: GGF enema shows anastomotic leak, controlled with drain  Physical deconditioning: Ambulate in hall TID, appreciate PT/OT involvement    Narcotic dependence: weaning dilaudid slowly  Urinary retention: foley replaced on Mon 11/1.  foley out again 11/7, failed voiding trial 11/9 with elevated PVR.   Foley replaced, will need urology f/u as outpatient.  May be able to retry voiding trial once pelvic abscess resolved  Pt may need exlap LOA if he cannot tolerate a PO diet.  Planning for this next Wed unless his intake improves  Dispo: pt has a temporary housing plan in place at discharge in [place] with his Aunt.  TOC team consulted and working with housing authority to assist with long term housing.      LOS: 42 days    Rosario Adie [id number]

## 2021-09-26 NOTE — Progress Notes (Signed)
   09/26/21 1300  Mobility  Activity Ambulated in hall  Level of Assistance Standby assist, set-up cues, supervision of patient - no hands on  Assistive Device Front wheel walker  Distance Ambulated (ft) 200 ft  Mobility Ambulated with assistance in hallway  Mobility Response Tolerated well  Mobility performed by [facility] member;Mobility specialist  $Mobility charge 1 Mobility   Pt ambulated about 225ft in hall with RW, family present. Tolerated well. Left pt in bed with call bell at side.   [place] Specialist Acute Rehab Services Office: [phone]

## 2021-09-27 ENCOUNTER — Other Ambulatory Visit (HOSPITAL_COMMUNITY): Payer: Self-pay

## 2021-09-27 MED ORDER — TRAVASOL 10 % IV SOLN
INTRAVENOUS | Status: AC
  Filled 2021-09-27: qty 1020

## 2021-09-27 NOTE — Progress Notes (Signed)
   09/27/21 1443  Mobility  Activity Ambulated in hall  Level of Assistance Standby assist, set-up cues, supervision of patient - no hands on  Assistive Device Front wheel walker  Distance Ambulated (ft) 200 ft  Mobility Ambulated with assistance in hallway  Mobility Response Tolerated well  Mobility performed by Mobility specialist  $Mobility charge 1 Mobility   Pt agreeable to mobilize this afternoon. Ambulated about 28ft in hall with RW, tolerated well. Took 1 standing rest break. Got pt back to bed. Pt emptied ostomy bag. Assisted pt to lying position. Left pt in bed with call bell at side and NT present.   [place] Specialist Acute Rehab Services Office: [phone]

## 2021-09-27 NOTE — Progress Notes (Signed)
43 Days Post-Op robotic LAR, diverting ostomy  Subjective/Chief Complaint:  No acute change   Vital signs in last 24 hours: Temp:  [97.4 F (36.3 C)-98.3 F (36.8 C)] 97.6 F (36.4 C) (12/10 0423) Pulse Rate:  [101-102] 101 (12/10 0423) Resp:  [17-19] 18 (12/10 0423) BP: (125-130)/(84-87) 130/86 (12/10 0423) SpO2:  [99 %-100 %] 99 % (12/10 0423) Weight:  [62.8 kg] 62.8 kg (12/10 0500) Last BM Date: 09/27/21  Intake/Output from previous day: 12/09 0701 - 12/10 0700 In: 5740 [P.O.:2390; I.V.:3350] Out: 2420 [Urine:875; Drains:95; NIOEV:0350] Intake/Output this shift: No intake/output data recorded.  General appearance: alert, cooperative, and no distress GI: distended, non tender in lower abdomen; ostomy pink with liquid stool  Incisions healed; surgical JP with purulent output, transgluteal drain with green-tinged purulent output c/w known leak  Lab Results:  No results for input(s): WBC, HGB, HCT, PLT in the last 72 hours.  BMET Recent Labs    09/25/21 0441  NA 143  K 3.8  CL 109  CO2 26  GLUCOSE 143*  BUN 26*  CREATININE 0.77  CALCIUM 9.0    PT/INR No results for input(s): LABPROT, INR in the last 72 hours. ABG No results for input(s): PHART, HCO3 in the last 72 hours.  Invalid input(s): PCO2, PO2  Studies/Results: DG BE (COLON)W SINGLE CM (SOL OR THIN BA)  Result Date: 09/25/2021 CLINICAL DATA:  Evaluate for anastomotic leak.  History of APR. EXAM: WATER SOLUBLE CONTRAST ENEMA TECHNIQUE: Contrast enema was performed using water-soluble contrast material per rectum. FLUOROSCOPY TIME:  Fluoroscopy Time:  2 minutes and 12 seconds Radiation Exposure Index (if provided by the fluoroscopic device): 42.9 mGy Number of Acquired Spot Images: 0 COMPARISON:  CT scan 09/15/2021 FINDINGS: Extravasation of contrast noted along the right-side of the anastomosis in the region of the right-sided drainage 2. Possible second area of extravasation noted on the left side although  this could communicate with the other area. The rest of the colon is unremarkable. IMPRESSION: Extravasation of contrast suggesting an anastomotic leak. Electronically Signed   By: Marijo Sanes M.D.   On: 09/25/2021 10:05    Anti-infectives: Anti-infectives (From admission, onward)    Start     Dose/Rate Route Frequency Ordered Stop   09/17/21 1730  piperacillin-tazobactam (ZOSYN) IVPB 3.375 g        3.375 g 12.5 mL/hr over 240 Minutes Intravenous Every 8 hours 09/17/21 1631 09/22/21 1001   09/08/21 1400  cefTRIAXone (ROCEPHIN) 2 g in sodium chloride 0.9 % 100 mL IVPB       Note to Pharmacy: Pharmacy may adjust dosing strength / duration / interval for maximal efficacy   2 g 200 mL/hr over 30 Minutes Intravenous Every 24 hours 09/08/21 1307 09/14/21 1431   08/26/21 1000  metroNIDAZOLE (FLAGYL) IVPB 500 mg  Status:  Discontinued        500 mg 100 mL/hr over 60 Minutes Intravenous Every 12 hours 08/26/21 0830 08/26/21 0910   08/26/21 1000  piperacillin-tazobactam (ZOSYN) IVPB 3.375 g  Status:  Discontinued        3.375 g 12.5 mL/hr over 240 Minutes Intravenous Every 8 hours 08/26/21 0910 09/01/21 0819   08/25/21 1030  metroNIDAZOLE (FLAGYL) tablet 500 mg  Status:  Discontinued        500 mg Per Tube Every 12 hours 08/25/21 0935 08/26/21 0830   08/25/21 1000  cefTRIAXone (ROCEPHIN) 2 g in sodium chloride 0.9 % 100 mL IVPB  Status:  Discontinued  Note to Pharmacy: Pharmacy may adjust dosing strength / duration / interval for maximal efficacy   2 g 200 mL/hr over 30 Minutes Intravenous Every 24 hours 08/25/21 0823 08/26/21 0910   08/25/21 1000  metroNIDAZOLE (FLAGYL) tablet 500 mg  Status:  Discontinued        500 mg Oral Every 12 hours 08/25/21 0837 08/25/21 0935   08/14/2021 2000  cefoTEtan (CEFOTAN) 2 g in sodium chloride 0.9 % 100 mL IVPB        2 g 200 mL/hr over 30 Minutes Intravenous Every 12 hours 08/06/2021 1647 07/30/2021 2046   07/31/2021 0600  cefoTEtan (CEFOTAN) 2 g in sodium  chloride 0.9 % 100 mL IVPB        2 g 200 mL/hr over 30 Minutes Intravenous On call to O.R. 08/14/2021 0513 08/12/2021 0806       Assessment/Plan: s/p Procedure(s): XI ROBOTIC ASSISTED LOWER ANTERIOR RESECTION, WITH BILATERAL TAP BLOCK AND INTRAOPERATIVE ASSESSMENT USING ICG (N/A) DIVERTING ILEOSTOMY (N/A) CYSTOSCOPY with FIREFLY INJECTION AND URETHERAL DILATION (N/A) Ileus: most likely related to pelvic infection and small bowel adhesion in the pelvis, NG out 11/7, AXR with continued ileus on 11/17.  Pt refusing NG replacement.  PO diet as tolerated, NGT PRN  Severe malnutrition, prolonged ileus: pt not eating well enough to stop TPN  Dehydration: cont MIV due to high output ileostomy  High output ileostomy: will cont fiber pills but hold imodium due to ileus and vomiting.  pSBO: due to pelvic adhesions from pelvic abscess.  Pt unable to tolerate NG, IR not able to place, pt unwilling to undergo NG placement in fluoro, IR unable to place decompressive PEG tube.  Will see what PO intake pt can achieve.  Cont reglan q6h  Anemia: most likely related to chronic illness and malnutrition.  Iron supplements ordered but pt did not tolerate this.  Iron levels low.  IV iron given   anastomotic dehiscence  - JP drainage catheter appears to be in appropriate position, Zosyn d/c'd 11/14.  wbc normal.  - Rpt CT 11/20 shows increased abscess size with drain in correct position.   restarted drain flushes.  No sign of systemic infection.  Discussed with IR on 11/21.  No further targets available for additional drainage.   7 day course of abx repeated.   - CT 11/28 fluid collection more organized but slightly smaller, IR placed TG drain 11/30, Zosyn x5 days -12/8: GGF enema shows anastomotic leak, controlled with drain  Physical deconditioning: Ambulate in hall TID, appreciate PT/OT involvement   Narcotic dependence: weaning dilaudid slowly  Urinary retention: foley replaced on Mon 11/1.  foley out  again 11/7, failed voiding trial 11/9 with elevated PVR.   Foley replaced, will need urology f/u as outpatient.  May be able to retry voiding trial once pelvic abscess resolved  Pt may need exlap LOA if he cannot tolerate a PO diet.  Planning for this next Wed unless his intake improves  Dispo: pt has a temporary housing plan in place at discharge in [place] with his Aunt.  TOC team consulted and working with housing authority to assist with long term housing.      LOS: 43 days    Phillip Brooks 09/27/2021

## 2021-09-27 NOTE — Progress Notes (Signed)
PHARMACY - TOTAL PARENTERAL NUTRITION CONSULT NOTE   Indication: intolerance to enteral feeding  Patient Measurements: Height: 5\' 6"  (167.6 cm) Weight: 62.8 kg (138 lb 7.2 oz) IBW/kg (Calculated) : 63.8 TPN AdjBW (KG): 59.7 Body mass index is 22.35 kg/m.  Assessment:  65 yo M presents on 10/28 for eval of rectal cancer. S/P LAR and diverting ileostomy. Has been NPO for 7 days and NG tube in place with high output. Pharmacy consulted to start TPN. Stopped TPN on 11/15 but had to restart on 11/17 as patient vomited as is unable to tolerate PO.  Glucose / Insulin: No Hx DM. CBGs <544 on 14 hour cyclic TPN. No SSI required, DC SSI/CBGs 12/6 Electrolytes: lytes WNL on 12/8 Renal: SCr WNL, BUN slightly elevated but stable at 26 (12/8)  Hepatic: Albumin remains low; LFTs and Tbili WNL, TG 78(12.5) Intake / Output;  No NGT > Unsuccessful 12/4; IR unable to place 12/5 Stool 1450/24 hrs. UOP 875 mL/24 hrs, drain 95 mL/24 hrs.  No Po intake recorded in days, poor appetite reported MIVF: NS increased from 40 to 50 ml/hr 12/7 by [facility] GI Imaging: 11/4 CT abdomen shows fluid collection, SBO, bladder thickening  11/17 KUB: Dilated small bowel loops are noted concerning for distal small bowel obstruction 11/19 CT:  Small-bowel obstruction, increase in size of gas and fluid abscess formation  11/28 CTAP: mild decrease in gas/gjuid in presacral space, no change in small bowel obstruction 12/5 CTAP:  severe dilatation of small bowel loops in the abdomen concerning for ileus/obstruction 12/7 GGF enema: shows anastomotic leak, controlled with drain GI Surgeries / Procedures:  10/28: LAR/Diverting ileostomy  11/30: Successful CT-guided presacral pelvic abscess drain placement Central access: Implanted Port TPN start date: 11/5 >> 11/15. Resumed 11/17>>  Nutritional Goals:  Goal TPN rate is 85 mL/hr (provides 102 g of protein and 1958 kcals per day)  RD Assessment: Estimated Needs Total Energy  Estimated Needs: 1950-2150 Total Protein Estimated Needs: 100-110g Total Fluid Estimated Needs: 2L/day  Current Nutrition:  NPO > clear liquid diet (11/25)  TPN transitioned to 14 hour cyclic   Plan:  Continue 14 hour cyclic TPN with 1 hour ramp up/ramp down (78-157 ml/hr)  NS increased to 50 ml/hr 12/7 by [facility] Electrolytes in TPN: Na 100 mEq/L, K 48mEq/L, Ca 46mEq/L, Mg 4mEq/L, and Phos 62mmol/L. Cl:Ac 1:2 Add standard MVI and trace elements to TPN NS @ 50 ml/hr as above TPN labs on Mon/Thurs F/u PO intake per [facility] plans.  Pt unable to tolerate NG, IR not able to place decompressive PEG tube.  Peggyann Juba, PharmD, BCPS Pharmacy: [phone] 09/27/2021 8:18 AM

## 2021-09-28 MED ORDER — TRAVASOL 10 % IV SOLN
INTRAVENOUS | Status: AC
  Filled 2021-09-28: qty 1020

## 2021-09-28 NOTE — Progress Notes (Signed)
PHARMACY - TOTAL PARENTERAL NUTRITION CONSULT NOTE   Indication: intolerance to enteral feeding  Patient Measurements: Height: 5\' 6"  (167.6 cm) Weight: 62.5 kg (137 lb 12.6 oz) IBW/kg (Calculated) : 63.8 TPN AdjBW (KG): 59.7 Body mass index is 22.24 kg/m.  Assessment:  65 yo M presents on 10/28 for eval of rectal cancer. S/P LAR and diverting ileostomy. Has been NPO for 7 days and NG tube in place with high output. Pharmacy consulted to start TPN. Stopped TPN on 11/15 but had to restart on 11/17 as patient vomited as is unable to tolerate PO.  Glucose / Insulin: No Hx DM. CBGs <401 on 14 hour cyclic TPN. No SSI required, DC SSI/CBGs 12/6 Electrolytes: lytes WNL on 12/8 Renal: SCr WNL, BUN slightly elevated but stable at 26 (12/8)  Hepatic: Albumin remains low; LFTs and Tbili WNL, TG 78(12.5) Intake / Output;  No NGT > Unsuccessful 12/4; IR unable to place 12/5 Stool 1450/24 hrs. UOP 875 mL/24 hrs, drain 95 mL/24 hrs.  No Po intake recorded in days, poor appetite reported MIVF: NS increased from 40 to 50 ml/hr 12/7 by [facility] GI Imaging: 11/4 CT abdomen shows fluid collection, SBO, bladder thickening  11/17 KUB: Dilated small bowel loops are noted concerning for distal small bowel obstruction 11/19 CT:  Small-bowel obstruction, increase in size of gas and fluid abscess formation  11/28 CTAP: mild decrease in gas/gjuid in presacral space, no change in small bowel obstruction 12/5 CTAP:  severe dilatation of small bowel loops in the abdomen concerning for ileus/obstruction 12/7 GGF enema: shows anastomotic leak, controlled with drain GI Surgeries / Procedures:  10/28: LAR/Diverting ileostomy  11/30: Successful CT-guided presacral pelvic abscess drain placement Central access: Implanted Port TPN start date: 11/5 >> 11/15. Resumed 11/17>>  Nutritional Goals:  Goal TPN rate is 85 mL/hr (provides 102 g of protein and 1958 kcals per day)  RD Assessment: Estimated Needs Total Energy  Estimated Needs: 1950-2150 Total Protein Estimated Needs: 100-110g Total Fluid Estimated Needs: 2L/day  Current Nutrition:  NPO > clear liquid diet (11/25)  TPN transitioned to 14 hour cyclic   Plan:  Continue 14 hour cyclic TPN with 1 hour ramp up/ramp down (78-157 ml/hr)  NS increased to 50 ml/hr 12/7 by [facility] Electrolytes in TPN: Na 100 mEq/L, K 48mEq/L, Ca 4mEq/L, Mg 29mEq/L, and Phos 22mmol/L. Cl:Ac 1:2 Add standard MVI and trace elements to TPN NS @ 50 ml/hr as above TPN labs on Mon/Thurs F/u PO intake per [facility] plans.  Pt unable to tolerate NG, IR not able to place decompressive PEG tube. Noted [facility] plan that pt may need exlap LOA if he cannot tolerate a PO diet.  Planning for this next Wed unless his intake improves.  Peggyann Juba, PharmD, BCPS Pharmacy: [phone] 09/28/2021 8:01 AM

## 2021-09-28 NOTE — Progress Notes (Signed)
Dr Ninfa Linden aware pt vomited 1000 ml of green fluid. Meds given. Md ordered NPO and attempt NGT if pt doesn't refuse.

## 2021-09-28 NOTE — Progress Notes (Signed)
44 Days Post-Op robotic LAR, diverting ostomy  Subjective/Chief Complaint: Patient ate some oranges with water yesterday and was having a great day, but unfortunately had large volume emesis overnight and notes worsening abdominal distention today.  Continues to have ostomy output.  Vital signs in last 24 hours: Temp:  [97.7 F (36.5 C)-98 F (36.7 C)] 98 F (36.7 C) (12/11 0639) Pulse Rate:  [94-98] 98 (12/11 0639) Resp:  [17-18] 18 (12/11 0639) BP: (115-121)/(73-84) 121/73 (12/11 0639) SpO2:  [99 %-100 %] 99 % (12/11 0639) Weight:  [62.5 kg] 62.5 kg (12/11 0500) Last BM Date: 09/27/21  Intake/Output from previous day: 12/10 0701 - 12/11 0700 In: 2053.2 [P.O.:1440; I.V.:613.2] Out: 3095 [Urine:1250; Drains:220; VEHMC:9470] Intake/Output this shift: No intake/output data recorded.  General appearance: alert, cooperative, and no distress GI: distended more so than yesterday, non tender in lower abdomen; ostomy pink with liquid stool  Incisions healed; surgical JP with purulent output, transgluteal drain with green-tinged purulent output c/w known leak  Lab Results:  No results for input(s): WBC, HGB, HCT, PLT in the last 72 hours.  BMET No results for input(s): NA, K, CL, CO2, GLUCOSE, BUN, CREATININE, CALCIUM in the last 72 hours.  PT/INR No results for input(s): LABPROT, INR in the last 72 hours. ABG No results for input(s): PHART, HCO3 in the last 72 hours.  Invalid input(s): PCO2, PO2  Studies/Results: No results found.  Anti-infectives: Anti-infectives (From admission, onward)    Start     Dose/Rate Route Frequency Ordered Stop   09/17/21 1730  piperacillin-tazobactam (ZOSYN) IVPB 3.375 g        3.375 g 12.5 mL/hr over 240 Minutes Intravenous Every 8 hours 09/17/21 1631 09/22/21 1001   09/08/21 1400  cefTRIAXone (ROCEPHIN) 2 g in sodium chloride 0.9 % 100 mL IVPB       Note to Pharmacy: Pharmacy may adjust dosing strength / duration / interval for maximal  efficacy   2 g 200 mL/hr over 30 Minutes Intravenous Every 24 hours 09/08/21 1307 09/14/21 1431   08/26/21 1000  metroNIDAZOLE (FLAGYL) IVPB 500 mg  Status:  Discontinued        500 mg 100 mL/hr over 60 Minutes Intravenous Every 12 hours 08/26/21 0830 08/26/21 0910   08/26/21 1000  piperacillin-tazobactam (ZOSYN) IVPB 3.375 g  Status:  Discontinued        3.375 g 12.5 mL/hr over 240 Minutes Intravenous Every 8 hours 08/26/21 0910 09/01/21 0819   08/25/21 1030  metroNIDAZOLE (FLAGYL) tablet 500 mg  Status:  Discontinued        500 mg Per Tube Every 12 hours 08/25/21 0935 08/26/21 0830   08/25/21 1000  cefTRIAXone (ROCEPHIN) 2 g in sodium chloride 0.9 % 100 mL IVPB  Status:  Discontinued       Note to Pharmacy: Pharmacy may adjust dosing strength / duration / interval for maximal efficacy   2 g 200 mL/hr over 30 Minutes Intravenous Every 24 hours 08/25/21 0823 08/26/21 0910   08/25/21 1000  metroNIDAZOLE (FLAGYL) tablet 500 mg  Status:  Discontinued        500 mg Oral Every 12 hours 08/25/21 0837 08/25/21 0935   07/21/2021 2000  cefoTEtan (CEFOTAN) 2 g in sodium chloride 0.9 % 100 mL IVPB        2 g 200 mL/hr over 30 Minutes Intravenous Every 12 hours 08/13/2021 1647 08/09/2021 2046   07/19/2021 0600  cefoTEtan (CEFOTAN) 2 g in sodium chloride 0.9 % 100 mL IVPB  2 g 200 mL/hr over 30 Minutes Intravenous On call to O.R. 08/02/2021 0513 07/23/2021 0806       Assessment/Plan: s/p Procedure(s): XI ROBOTIC ASSISTED LOWER ANTERIOR RESECTION, WITH BILATERAL TAP BLOCK AND INTRAOPERATIVE ASSESSMENT USING ICG (N/A) DIVERTING ILEOSTOMY (N/A) CYSTOSCOPY with FIREFLY INJECTION AND URETHERAL DILATION (N/A) Ileus: most likely related to pelvic infection and small bowel adhesion in the pelvis, NG out 11/7, AXR with continued ileus on 11/17.  Pt refusing NG replacement.  PO diet as tolerated, NGT PRN.  Patient advised to stick with limited ice chips today given emesis and worsening distention.  Severe  malnutrition, prolonged ileus: pt not eating well enough to stop TPN  Dehydration: cont MIV due to high output ileostomy  High output ileostomy: will cont fiber pills but hold imodium due to ileus and vomiting.  pSBO: due to pelvic adhesions from pelvic abscess.  Pt unable to tolerate NG, IR not able to place, pt unwilling to undergo NG placement in fluoro, IR unable to place decompressive PEG tube.  Not doing well with p.o. intake..  Cont reglan q6h  Anemia: most likely related to chronic illness and malnutrition.  Iron supplements ordered but pt did not tolerate this.  Iron levels low.  IV iron given   anastomotic dehiscence  - JP drainage catheter appears to be in appropriate position, Zosyn d/c'd 11/14.  wbc normal.  - Rpt CT 11/20 shows increased abscess size with drain in correct position.   restarted drain flushes.  No sign of systemic infection.  Discussed with IR on 11/21.  No further targets available for additional drainage.   7 day course of abx repeated.   - CT 11/28 fluid collection more organized but slightly smaller, IR placed TG drain 11/30, Zosyn x5 days -12/8: GGF enema shows anastomotic leak, controlled with drain  Physical deconditioning: Ambulate in hall TID, appreciate PT/OT involvement   Narcotic dependence: weaning dilaudid slowly  Urinary retention: foley replaced on Mon 11/1.  foley out again 11/7, failed voiding trial 11/9 with elevated PVR.   Foley replaced, will need urology f/u as outpatient.  May be able to retry voiding trial once pelvic abscess resolved  Pt may need exlap LOA if he cannot tolerate a PO diet.  Planning for this next Wed unless his intake improves  Dispo: pt has a temporary housing plan in place at discharge in [place] with his Aunt.  TOC team consulted and working with housing authority to assist with long term housing.      LOS: 44 days    Phillip Brooks 09/28/2021

## 2021-09-29 ENCOUNTER — Other Ambulatory Visit (HOSPITAL_COMMUNITY): Payer: Self-pay

## 2021-09-29 LAB — CBC
HCT: 23.5 % — ABNORMAL LOW (ref 39.0–52.0)
Hemoglobin: 6.6 g/dL — CL (ref 13.0–17.0)
MCH: 24.7 pg — ABNORMAL LOW (ref 26.0–34.0)
MCHC: 28.1 g/dL — ABNORMAL LOW (ref 30.0–36.0)
MCV: 88 fL (ref 80.0–100.0)
Platelets: 510 10*3/uL — ABNORMAL HIGH (ref 150–400)
RBC: 2.67 MIL/uL — ABNORMAL LOW (ref 4.22–5.81)
RDW: 16.7 % — ABNORMAL HIGH (ref 11.5–15.5)
WBC: 6.4 10*3/uL (ref 4.0–10.5)
nRBC: 0 % (ref 0.0–0.2)

## 2021-09-29 LAB — COMPREHENSIVE METABOLIC PANEL
ALT: 16 U/L (ref 0–44)
AST: 14 U/L — ABNORMAL LOW (ref 15–41)
Albumin: 2.9 g/dL — ABNORMAL LOW (ref 3.5–5.0)
Alkaline Phosphatase: 49 U/L (ref 38–126)
Anion gap: 10 (ref 5–15)
BUN: 46 mg/dL — ABNORMAL HIGH (ref 8–23)
CO2: 28 mmol/L (ref 22–32)
Calcium: 8.9 mg/dL (ref 8.9–10.3)
Chloride: 105 mmol/L (ref 98–111)
Creatinine, Ser: 1.14 mg/dL (ref 0.61–1.24)
GFR, Estimated: >60 mL/min (ref >60)
Glucose, Bld: 135 mg/dL — ABNORMAL HIGH (ref 70–99)
Potassium: 4.4 mmol/L (ref 3.5–5.1)
Sodium: 143 mmol/L (ref 135–145)
Total Bilirubin: <0.1 mg/dL — ABNORMAL LOW (ref 0.3–1.2)
Total Protein: 8.1 g/dL (ref 6.5–8.1)

## 2021-09-29 LAB — PHOSPHORUS: Phosphorus: 3.9 mg/dL (ref 2.5–4.6)

## 2021-09-29 LAB — TRIGLYCERIDES: Triglycerides: 83 mg/dL (ref <150)

## 2021-09-29 LAB — PREPARE RBC (CROSSMATCH)

## 2021-09-29 LAB — MAGNESIUM: Magnesium: 2.6 mg/dL — ABNORMAL HIGH (ref 1.7–2.4)

## 2021-09-29 MED ORDER — TRAVASOL 10 % IV SOLN
INTRAVENOUS | Status: AC
  Filled 2021-09-29: qty 1020

## 2021-09-29 MED ORDER — SODIUM CHLORIDE 0.9% IV SOLUTION: Freq: Once | INTRAVENOUS | Status: AC

## 2021-09-29 NOTE — Progress Notes (Signed)
Date and time results received: 09/29/21 0500   Test: Hgb Critical Value: 6.6  Name of Provider Notified: Dr. Ninfa Linden  Orders Received? Or Actions Taken?: No new orders at this time, stated that Dr. Marcello Moores will take over care in the morning.

## 2021-09-29 NOTE — Plan of Care (Signed)
  Problem: Pain Managment: Goal: General experience of comfort will improve Outcome: Progressing   

## 2021-09-29 NOTE — Progress Notes (Signed)
45 Days Post-Op robotic LAR, diverting ostomy  Subjective/Chief Complaint:  ambulating in hall, had quite a bit of vomiting after trying some cereal   Vital signs in last 24 hours: Temp:  [97.7 F (36.5 C)-98.1 F (36.7 C)] 97.8 F (36.6 C) (12/12 0545) Pulse Rate:  [95-104] 104 (12/12 0545) Resp:  [16-20] 20 (12/12 0545) BP: (112-125)/(77-80) 112/77 (12/12 0545) SpO2:  [99 %-100 %] 99 % (12/12 0545) Weight:  [58.6 kg] 58.6 kg (12/12 0545) Last BM Date: 09/29/21  Intake/Output from previous day: 12/11 0701 - 12/12 0700 In: 2751.8 [I.V.:2751.8] Out: 2445 [Urine:1300; Emesis/NG output:1000; Drains:95; Stool:50] Intake/Output this shift: Total I/O In: 0  Out: 150 [Stool:150]  General appearance: alert, cooperative, and no distress GI: distended, non tender in lower abdomen; ostomy pink with liquid stool  Incisions OK, both JP drains with purulent drainage  Lab Results:  Recent Labs    09/29/21 0428  WBC 6.4  HGB 6.6*  HCT 23.5*  PLT 510*    BMET Recent Labs    09/29/21 0428  NA 143  K 4.4  CL 105  CO2 28  GLUCOSE 135*  BUN 46*  CREATININE 1.14  CALCIUM 8.9    PT/INR No results for input(s): LABPROT, INR in the last 72 hours. ABG No results for input(s): PHART, HCO3 in the last 72 hours.  Invalid input(s): PCO2, PO2  Studies/Results: No results found.  Anti-infectives: Anti-infectives (From admission, onward)    Start     Dose/Rate Route Frequency Ordered Stop   09/17/21 1730  piperacillin-tazobactam (ZOSYN) IVPB 3.375 g        3.375 g 12.5 mL/hr over 240 Minutes Intravenous Every 8 hours 09/17/21 1631 09/22/21 1001   09/08/21 1400  cefTRIAXone (ROCEPHIN) 2 g in sodium chloride 0.9 % 100 mL IVPB       Note to Pharmacy: Pharmacy may adjust dosing strength / duration / interval for maximal efficacy   2 g 200 mL/hr over 30 Minutes Intravenous Every 24 hours 09/08/21 1307 09/14/21 1431   08/26/21 1000  metroNIDAZOLE (FLAGYL) IVPB 500 mg  Status:   Discontinued        500 mg 100 mL/hr over 60 Minutes Intravenous Every 12 hours 08/26/21 0830 08/26/21 0910   08/26/21 1000  piperacillin-tazobactam (ZOSYN) IVPB 3.375 g  Status:  Discontinued        3.375 g 12.5 mL/hr over 240 Minutes Intravenous Every 8 hours 08/26/21 0910 09/01/21 0819   08/25/21 1030  metroNIDAZOLE (FLAGYL) tablet 500 mg  Status:  Discontinued        500 mg Per Tube Every 12 hours 08/25/21 0935 08/26/21 0830   08/25/21 1000  cefTRIAXone (ROCEPHIN) 2 g in sodium chloride 0.9 % 100 mL IVPB  Status:  Discontinued       Note to Pharmacy: Pharmacy may adjust dosing strength / duration / interval for maximal efficacy   2 g 200 mL/hr over 30 Minutes Intravenous Every 24 hours 08/25/21 0823 08/26/21 0910   08/25/21 1000  metroNIDAZOLE (FLAGYL) tablet 500 mg  Status:  Discontinued        500 mg Oral Every 12 hours 08/25/21 0837 08/25/21 0935   08/06/2021 2000  cefoTEtan (CEFOTAN) 2 g in sodium chloride 0.9 % 100 mL IVPB        2 g 200 mL/hr over 30 Minutes Intravenous Every 12 hours 07/21/2021 1647 08/10/2021 2046   07/24/2021 0600  cefoTEtan (CEFOTAN) 2 g in sodium chloride 0.9 % 100 mL IVPB  2 g 200 mL/hr over 30 Minutes Intravenous On call to O.R. 08/01/2021 0513 07/27/2021 0806       Assessment/Plan: s/p Procedure(s): XI ROBOTIC ASSISTED LOWER ANTERIOR RESECTION, WITH BILATERAL TAP BLOCK AND INTRAOPERATIVE ASSESSMENT USING ICG (N/A) DIVERTING ILEOSTOMY (N/A) CYSTOSCOPY with FIREFLY INJECTION AND URETHERAL DILATION (N/A) Ileus: most likely related to pelvic infection and small bowel adhesion in the pelvis, NG out 11/7, AXR with continued ileus on 11/17.  Pt refusing NG replacement.  PO diet as tolerated, NGT PRN  Severe malnutrition, prolonged ileus: pt not eating well enough to stop TPN, cant tolerate a diet, cont NPO, plan for OR on Wed  Dehydration: cont MIV due to high output ileostomy  High output ileostomy: will cont fiber pills   pSBO: due to pelvic adhesions from  pelvic abscess.  Pt unable to tolerate NG, IR not able to place, pt unwilling to undergo NG placement in fluoro, IR unable to place decompressive PEG tube.  Pt unable to tolerate a PO diet  Anemia: most likely related to chronic illness and malnutrition.  Iron supplements ordered but pt did not tolerate this.  Iron levels low.  IV iron given   anastomotic dehiscence  - JP drainage catheter appears to be in appropriate position, Zosyn d/c'd 11/14.  wbc normal.  - Rpt CT 11/20 shows increased abscess size with drain in correct position.   restarted drain flushes.  No sign of systemic infection.  Discussed with IR on 11/21.  No further targets available for additional drainage.   7 day course of abx repeated.   - CT 11/28 fluid collection more organized but slightly smaller, IR placed TG drain 11/30, Zosyn x5 days -12/8: GGF enema shows anastomotic leak, controlled with drain  Physical deconditioning: Ambulate in hall TID, appreciate PT/OT involvement   Narcotic dependence: weaning dilaudid slowly  Urinary retention: foley replaced on Mon 11/1.  foley out again 11/7, failed voiding trial 11/9 with elevated PVR.   Foley replaced, will need urology f/u as outpatient.  May be able to retry voiding trial once pelvic abscess resolved  Pt will require LOA, since he cannot tolerate a PO diet.  Planning for this on Wed   Dispo: pt has a temporary housing plan in place at discharge in [place] with his [place].  TOC team consulted and working with housing authority to assist with long term housing.      LOS: 45 days    Phillip Brooks [id number]/83/[id number]

## 2021-09-29 NOTE — Progress Notes (Addendum)
PHARMACY - TOTAL PARENTERAL NUTRITION CONSULT NOTE   Indication: intolerance to enteral feeding  Patient Measurements: Height: 5\' 6"  (167.6 cm) Weight: 58.6 kg (129 lb 3 oz) IBW/kg (Calculated) : 63.8 TPN AdjBW (KG): 59.7 Body mass index is 20.85 kg/m.  Assessment:  65 yo M presents on 10/28 for eval of rectal cancer. S/P LAR and diverting ileostomy. Has been NPO for 7 days and NG tube in place with high output. Pharmacy consulted to start TPN. Stopped TPN on 11/15 but had to restart on 11/17 as patient vomited as is unable to tolerate PO.  Glucose / Insulin: No Hx DM. CBGs <149 on 14 hour cyclic TPN. No SSI required, DC SSI/CBGs 12/6 Electrolytes: lytes WNL except Mag up to 2.6; K up to 4.4, Phos 3.9 Renal: SCr increased (0.77> 1.14), BUN increased to 46 (12/8)  Hepatic: Albumin remains low; LFTs and Tbili WNL, TG 83 (12/12) Intake / Output: No NGT > Unsuccessful 12/4; IR unable to place 12/5 Stool 30mL /24 hrs. UOP 1300 mL/24 hrs, drain 95 mL/24 hrs.  No Po intake recorded in days, poor appetite reported IVF: NS increased from 40 to 50 ml/hr 12/7 by CCS.  No administrations documented on 12/9 or 12/10.  PRBC transfusion 12/12. GI Imaging: 11/4 CT abdomen shows fluid collection, SBO, bladder thickening  11/17 KUB: Dilated small bowel loops are noted concerning for distal SBO 11/19 CT:  SBO, increase in size of gas and fluid abscess formation  11/28 CTAP: mild decrease in gas/gjuid in presacral space, no change in SBO 12/5 CTAP:  severe dilatation of small bowel loops in the abdomen concerning for ileus/obstruction 12/7 GGF enema: shows anastomotic leak, controlled with drain GI Surgeries / Procedures:  10/28: LAR/Diverting ileostomy  11/30: Successful CT-guided presacral pelvic abscess drain placement Central access: Implanted Port TPN start date: 11/5 >> 11/15. Resumed 11/17>>  Nutritional Goals:  Cyclic TPN:  7026 mL provides 102 g of protein and 1958 kcals per day  RD  Assessment: Estimated Needs Total Energy Estimated Needs: 1950-2150 Total Protein Estimated Needs: 100-110g Total Fluid Estimated Needs: 2L/day  Current Nutrition:  NPO > clear liquid diet (11/25)  TPN transitioned to 14 hour cyclic   Plan:  Continue 14 hour cyclic TPN with 1 hour ramp up/ramp down (78-157 ml/hr)  Electrolytes in TPN: Na 100 mEq/L, K 21mEq/L, Ca 1mEq/L, Mg 84mEq/L, and Phos 62mmol/L. Cl:Ac 1:2 Add standard MVI and trace elements to TPN NS increased to 50 ml/hr 12/7 by CCS. TPN labs on Mon/Thurs F/u PO intake per CCS plans.  Pt unable to tolerate NG, IR not able to place decompressive PEG tube. Noted CCS plan for surgery on Wednesday.    Gretta Arab PharmD, BCPS Clinical Pharmacist [facility] main pharmacy [phone] 09/29/2021 8:18 AM

## 2021-09-30 LAB — TYPE AND SCREEN
ABO/RH(D): A NEG
Antibody Screen: NEGATIVE
Unit division: 0
Unit division: 0

## 2021-09-30 LAB — BASIC METABOLIC PANEL
Anion gap: 8 (ref 5–15)
BUN: 46 mg/dL — ABNORMAL HIGH (ref 8–23)
CO2: 30 mmol/L (ref 22–32)
Calcium: 9 mg/dL (ref 8.9–10.3)
Chloride: 108 mmol/L (ref 98–111)
Creatinine, Ser: 1.05 mg/dL (ref 0.61–1.24)
GFR, Estimated: >60 mL/min (ref >60)
Glucose, Bld: 110 mg/dL — ABNORMAL HIGH (ref 70–99)
Potassium: 3.9 mmol/L (ref 3.5–5.1)
Sodium: 146 mmol/L — ABNORMAL HIGH (ref 135–145)

## 2021-09-30 LAB — BPAM RBC
Blood Product Expiration Date: 202212282359
Blood Product Expiration Date: 202212282359
ISSUE DATE / TIME: 202212121310
ISSUE DATE / TIME: 202212121625
Unit Type and Rh: 600
Unit Type and Rh: 600

## 2021-09-30 LAB — PHOSPHORUS: Phosphorus: 3.7 mg/dL (ref 2.5–4.6)

## 2021-09-30 LAB — MAGNESIUM: Magnesium: 2.3 mg/dL (ref 1.7–2.4)

## 2021-09-30 MED ORDER — TRAVASOL 10 % IV SOLN
INTRAVENOUS | Status: AC
  Filled 2021-09-30: qty 1020

## 2021-09-30 NOTE — Progress Notes (Signed)
46 Days Post-Op robotic LAR, diverting ostomy  Subjective/Chief Complaint:  ambulating in hall, still having some vomiting.  Still refusing NG tube  Vital signs in last 24 hours: Temp:  [97.6 F (36.4 C)-98.1 F (36.7 C)] 97.6 F (36.4 C) (12/13 0622) Pulse Rate:  [88-100] 88 (12/13 0622) Resp:  [16-18] 18 (12/13 0622) BP: (113-131)/(74-88) 131/86 (12/13 0622) SpO2:  [99 %-100 %] 100 % (12/13 0622) Weight:  [60.1 kg] 60.1 kg (12/13 0500) Last BM Date: 09/29/21  Intake/Output from previous day: 12/12 0701 - 12/13 0700 In: 4214.3 [P.O.:120; I.V.:3344.3; Blood:730] Out: 1610 [Urine:650; Emesis/NG output:500; Drains:320; RUEAV:4098] Intake/Output this shift: No intake/output data recorded.  General appearance: alert, cooperative, and no distress GI: distended, non tender in lower abdomen; ostomy pink with liquid stool  Incisions OK, both JP drains with purulent bile tinged drainage  Lab Results:  Recent Labs    09/29/21 0428  WBC 6.4  HGB 6.6*  HCT 23.5*  PLT 510*    BMET Recent Labs    09/29/21 0428  NA 143  K 4.4  CL 105  CO2 28  GLUCOSE 135*  BUN 46*  CREATININE 1.14  CALCIUM 8.9    PT/INR No results for input(s): LABPROT, INR in the last 72 hours. ABG No results for input(s): PHART, HCO3 in the last 72 hours.  Invalid input(s): PCO2, PO2  Studies/Results: No results found.  Anti-infectives: Anti-infectives (From admission, onward)    Start     Dose/Rate Route Frequency Ordered Stop   09/17/21 1730  piperacillin-tazobactam (ZOSYN) IVPB 3.375 g        3.375 g 12.5 mL/hr over 240 Minutes Intravenous Every 8 hours 09/17/21 1631 09/22/21 1001   09/08/21 1400  cefTRIAXone (ROCEPHIN) 2 g in sodium chloride 0.9 % 100 mL IVPB       Note to Pharmacy: Pharmacy may adjust dosing strength / duration / interval for maximal efficacy   2 g 200 mL/hr over 30 Minutes Intravenous Every 24 hours 09/08/21 1307 09/14/21 1431   08/26/21 1000  metroNIDAZOLE (FLAGYL)  IVPB 500 mg  Status:  Discontinued        500 mg 100 mL/hr over 60 Minutes Intravenous Every 12 hours 08/26/21 0830 08/26/21 0910   08/26/21 1000  piperacillin-tazobactam (ZOSYN) IVPB 3.375 g  Status:  Discontinued        3.375 g 12.5 mL/hr over 240 Minutes Intravenous Every 8 hours 08/26/21 0910 09/01/21 0819   08/25/21 1030  metroNIDAZOLE (FLAGYL) tablet 500 mg  Status:  Discontinued        500 mg Per Tube Every 12 hours 08/25/21 0935 08/26/21 0830   08/25/21 1000  cefTRIAXone (ROCEPHIN) 2 g in sodium chloride 0.9 % 100 mL IVPB  Status:  Discontinued       Note to Pharmacy: Pharmacy may adjust dosing strength / duration / interval for maximal efficacy   2 g 200 mL/hr over 30 Minutes Intravenous Every 24 hours 08/25/21 0823 08/26/21 0910   08/25/21 1000  metroNIDAZOLE (FLAGYL) tablet 500 mg  Status:  Discontinued        500 mg Oral Every 12 hours 08/25/21 0837 08/25/21 0935   07/22/2021 2000  cefoTEtan (CEFOTAN) 2 g in sodium chloride 0.9 % 100 mL IVPB        2 g 200 mL/hr over 30 Minutes Intravenous Every 12 hours 08/09/2021 1647 07/20/2021 2046   08/07/2021 0600  cefoTEtan (CEFOTAN) 2 g in sodium chloride 0.9 % 100 mL IVPB  2 g 200 mL/hr over 30 Minutes Intravenous On call to O.R. 07/27/2021 0513 07/21/2021 0806       Assessment/Plan: s/p Procedure(s): XI ROBOTIC ASSISTED LOWER ANTERIOR RESECTION, WITH BILATERAL TAP BLOCK AND INTRAOPERATIVE ASSESSMENT USING ICG (N/A) DIVERTING ILEOSTOMY (N/A) CYSTOSCOPY with FIREFLY INJECTION AND URETHERAL DILATION (N/A) Ileus: most likely related to pelvic infection and small bowel adhesion in the pelvis, NG out 11/7, AXR with continued ileus on 11/17.  Pt refusing NG replacement.  PO diet as tolerated, NGT PRN  Severe malnutrition, prolonged ileus: pt not eating well enough to stop TPN, cant tolerate a diet, cont NPO, plan for OR on Wed  Dehydration: cont MIV due to high output ileostomy  High output ileostomy: will cont fiber pills   pSBO: due to  pelvic adhesions from pelvic abscess.  Pt unable to tolerate NG, IR not able to place, pt unwilling to undergo NG placement in fluoro, IR unable to place decompressive PEG tube.  Pt unable to tolerate a PO diet  Anemia: most likely related to chronic illness and malnutrition.  Iron supplements ordered but pt did not tolerate this.  Iron levels low.  IV iron given   anastomotic dehiscence  - JP drainage catheter appears to be in appropriate position, Zosyn d/c'd 11/14.  wbc normal.  - Rpt CT 11/20 shows increased abscess size with drain in correct position.   restarted drain flushes.  No sign of systemic infection.  Discussed with IR on 11/21.  No further targets available for additional drainage.   7 day course of abx repeated.   - CT 11/28 fluid collection more organized but slightly smaller, IR placed TG drain 11/30, Zosyn x5 days -12/8: GGF enema shows anastomotic leak, controlled with drain  Physical deconditioning: Ambulate in hall TID, appreciate PT/OT involvement   Narcotic dependence: weaning dilaudid slowly  Urinary retention: foley replaced on Mon 11/1.  foley out again 11/7, failed voiding trial 11/9 with elevated PVR.   Foley replaced, will need urology f/u as outpatient.  May be able to retry voiding trial once pelvic abscess resolved  Pt will require LOA, since he cannot tolerate a PO diet.  Planning for this on Wed   Dispo: pt has a temporary housing plan in place at discharge in [place] with his [place].  TOC team consulted and working with housing authority to assist with long term housing.      LOS: 46 days    Rosario Adie [id number]/[id number]

## 2021-09-30 NOTE — Progress Notes (Signed)
°   09/30/21 1459  Mobility  Activity Ambulated in hall  Level of Assistance Standby assist, set-up cues, supervision of patient - no hands on  Assistive Device Front wheel walker  Distance Ambulated (ft) 700 ft  Mobility Ambulated with assistance in hallway  Mobility Response Tolerated well  Mobility performed by Mobility specialist  $Mobility charge 1 Mobility   Pt agreeable to mobilize this afternoon. He emptied his ostomy bag prior to session. Ambulated about 777ft in hall with RW, tolerated well. Pt sat EOB after session and emptied ostomy bag again. Left pt in bed with call bell at side. RN notified of session.  [place] Specialist Acute Rehab Services Office: [phone]

## 2021-09-30 NOTE — Progress Notes (Signed)
Referring Physician(s): [place]  Supervising Physician: Markus Daft  Patient Status:  [facility] - In-pt  Chief Complaint: Nausea/vomiting   Subjective: Pt still having some intermittent N/V; appears he is scheduled for LOA tomorrow by [facility]   Allergies: Patient has no known allergies.  Medications: Prior to Admission medications   Medication Sig Start Date End Date Taking? Authorizing Provider  acetaminophen (TYLENOL) 650 MG CR tablet Take 650 mg by mouth every 8 (eight) hours as needed for pain.   Yes [provider]  BISACODYL 5 MG EC tablet Take 20 mg by mouth once as needed for constipation. 07/21/21  Yes [provider]  oxyCODONE (OXY IR/ROXICODONE) 5 MG immediate release tablet Take 1 tablet (5 mg total) by mouth every 6 (six) hours as needed for severe pain. 08/11/21  Yes Owens Shark, NP  polyethylene glycol powder (GLYCOLAX/MIRALAX) 17 GM/SCOOP powder Take 238 g by mouth once as needed. Colonoscopy prep 07/21/21  Yes [provider]  capecitabine (XELODA) 500 MG tablet Take 3 tablets (1,500 mg total) by mouth 2 (two) times daily after a meal. Take Monday-Friday. Take only on days of radiation. Patient not taking: No sig reported 04/18/21   Ladell Pier, MD  metroNIDAZOLE (FLAGYL) 500 MG tablet Take 1,000 mg by mouth 3 (three) times daily. 2 day supply 07/21/21   [provider]  neomycin (MYCIFRADIN) 500 MG tablet Take 1,000 mg by mouth 3 (three) times daily. 2 day supply 07/21/21   [provider]  traMADol (ULTRAM) 50 MG tablet Take 50 mg by mouth every 12 (twelve) hours as needed for moderate pain. 06/25/21   [provider]     Vital Signs: BP 131/86 (BP Location: Left Arm)    Pulse 88    Temp 97.6 F (36.4 C)    Resp 18    Ht 5\' 6"  (1.676 m)    Wt 132 lb 7.9 oz (60.1 kg)    SpO2 100%    BMI 21.39 kg/m   Physical Exam awake/alert; left TG drain intact, insertion site ok, draining bilious fluid(320 cc yesterday, 70  cc so far today)  Imaging: No results found.  Labs:  CBC: Recent Labs    08/28/21 0430 09/01/21 0417 09/05/21 0745 09/29/21 0428  WBC 11.8* 9.9 7.2 6.4  HGB 7.3* 7.1* 8.0* 6.6*  HCT 23.9* 23.1* 25.0* 23.5*  PLT 635* 707* 718* 510*    COAGS: Recent Labs    12/06/20 1706  INR 1.1*    BMP: Recent Labs    09/24/21 0419 09/25/21 0441 09/29/21 0428 09/30/21 0819  NA 143 143 143 146*  K 3.8 3.8 4.4 3.9  CL 108 109 105 108  CO2 27 26 28 30   GLUCOSE 130* 143* 135* 110*  BUN 25* 26* 46* 46*  CALCIUM 9.1 9.0 8.9 9.0  CREATININE 0.77 0.77 1.14 1.05  GFRNONAA >60 >60 >60 >60    LIVER FUNCTION TESTS: Recent Labs    09/21/21 0424 09/22/21 0427 09/25/21 0441 09/29/21 0428  BILITOT 0.2* 0.2* 0.3 <0.1*  AST 11* 12* 14* 14*  ALT 10 9 10 16   ALKPHOS 40 41 40 49  PROT 7.5 8.2* 8.2* 8.1  ALBUMIN 2.7* 2.9* 2.9* 2.9*    Assessment and Plan: 65 y.o male inpatient with hx  lower anterior resection with diverting ostomy on 07/19/2021. Hospital course complicated by a presacral pelvic abscess. IR placed a left transgluteal approach abscess drain on 09/17/21. Cultures revealed e coli and enterococcus; rt sided  anast leak noted on contrast enema 12/8; afebrile; last WBC yesterday nl; creat nl; for LOA tomorrow per [facility]  Drain Location: LEFT TRANSGLUTEAL Size: Fr size: 10 Fr Date of placement: 09/17/21  Currently to: Drain collection device: suction bulb 24 hour output:  Output by Drain (mL) 09/28/21 0701 - 09/28/21 1900 09/28/21 1901 - 09/29/21 0700 09/29/21 0701 - 09/29/21 1900 09/29/21 1901 - 09/30/21 0700 09/30/21 0701 - 09/30/21 1328  Closed System Drain Right RLQ Bulb (JP) 19 Fr. 5 0 0 0 0  Closed System Drain 1 Left Buttock Bulb (JP) 10 Fr. 50 40 50 270 70       Current examination: Flushes/aspirates easily.  Insertion site unremarkable. Suture in place Dressed appropriately.   Plan: Continue TID flushes with 5 cc NS. Record output Q shift. Dressing changes  QD or PRN if soiled.  Call IR APP or on call IR MD if difficulty flushing or sudden change in drain output.  Repeat imaging/possible drain injection once output < 10 mL/QD (excluding flush material.)  Discharge planning: Please contact IR APP or on call IR MD prior to patient d/c to ensure appropriate follow up plans are in place. Typically patient will follow up with IR clinic 10-14 days post d/c for repeat imaging/possible drain injection. IR scheduler will contact patient with date/time of appointment. Patient will need to flush drain QD with 5 cc NS, record output QD, dressing changes every 2-3 days or earlier if soiled.   IR will continue to follow - please call with questions or concerns.      Electronically Signed: D. Rowe Robert, PA-C 09/30/2021, 1:17 PM   I spent a total of 15 minutes at the the patient's bedside AND on the patient's hospital floor or unit, greater than 50% of which was counseling/coordinating care for left pelvic fluid collection drain    Patient ID: Phillip Brooks, male   DOB: [birth year], 65 y.o.   MRN: [id number]

## 2021-09-30 NOTE — Progress Notes (Signed)
PHARMACY - TOTAL PARENTERAL NUTRITION CONSULT NOTE   Indication: intolerance to enteral feeding  Patient Measurements: Height: 5\' 6"  (167.6 cm) Weight: 60.1 kg (132 lb 7.9 oz) IBW/kg (Calculated) : 63.8 TPN AdjBW (KG): 59.7 Body mass index is 21.39 kg/m.  Assessment:  65 yo M presents on 10/28 for eval of rectal cancer. S/P LAR and diverting ileostomy. Has been NPO for 7 days and NG tube in place with high output. Pharmacy consulted to start TPN. Stopped TPN on 11/15 but had to restart on 11/17 as patient vomited as is unable to tolerate PO.  Glucose / Insulin: No Hx DM. CBGs <578 on 14 hour cyclic TPN. No SSI required, DC SSI/CBGs 12/6 Electrolytes: Na up to 146, others WNL  Renal: SCr decreased (0.77> 1.14 > 1.05), BUN remains elevated at 46 (12/8)  Hepatic: Albumin remains low; LFTs and Tbili WNL, TG 83 (12/12) Intake / Output: UOP decreased to 650 ml/ 24 hrs, stool output increased to 2300 mL, drain output inc to 320 mL/ 24 hrs.   No NGT > Unsuccessful 12/4; IR unable to place 12/5 IVF: NS increased from 40 to 50 ml/hr 12/7 by CCS.  No administrations documented on 12/9 or 12/10.  PRBC transfusion 12/12. GI Imaging: 11/4 CT abdomen shows fluid collection, SBO, bladder thickening  11/17 KUB: Dilated small bowel loops are noted concerning for distal SBO 11/19 CT:  SBO, increase in size of gas and fluid abscess formation  11/28 CTAP: mild decrease in gas/gjuid in presacral space, no change in SBO 12/5 CTAP:  severe dilatation of small bowel loops in the abdomen concerning for ileus/obstruction 12/7 GGF enema: shows anastomotic leak, controlled with drain GI Surgeries / Procedures:  10/28: LAR/Diverting ileostomy  11/30: Successful CT-guided presacral pelvic abscess drain placement  Central access: Implanted Port TPN start date: 11/5 >> 11/15. Resumed 11/17>>  Nutritional Goals:  Cyclic TPN:  4696 mL provides 102 g of protein and 1958 kcals per day  RD Assessment: Estimated  Needs Total Energy Estimated Needs: 1950-2150 Total Protein Estimated Needs: 100-110g Total Fluid Estimated Needs: 2L/day  Current Nutrition:  NPO > clear liquid diet (11/25)  TPN transitioned to 14 hour cyclic   Plan:  Continue 14 hour cyclic TPN with 1 hour ramp up/ramp down (78-157 ml/hr)  Electrolytes in TPN: Na 70 mEq/L, K 46mEq/L, Ca 11mEq/L, Mg 58mEq/L, and Phos 42mmol/L. Cl:Ac 1:1 Add standard MVI and trace elements to TPN NS increased to 50 ml/hr 12/7 by C[facility]. TPN labs on Mon/Thurs F/u PO intake per [facility] plans.  Pt unable to tolerate NG, IR not able to place decompressive PEG tube. Noted [facility] plan for surgery on Wednesday.    Gretta Arab PharmD, BCPS Clinical Pharmacist [facility] main pharmacy [phone] 09/30/2021 7:10 AM

## 2021-10-01 ENCOUNTER — Encounter (HOSPITAL_COMMUNITY): Payer: Self-pay | Admitting: General Surgery

## 2021-10-01 ENCOUNTER — Inpatient Hospital Stay (HOSPITAL_COMMUNITY): Payer: Medicare HMO | Admitting: Registered Nurse

## 2021-10-01 ENCOUNTER — Encounter (HOSPITAL_COMMUNITY): Admission: RE | Disposition: E | Payer: Self-pay | Source: Home / Self Care | Attending: General Surgery

## 2021-10-01 HISTORY — PX: BOWEL RESECTION: SHX1257

## 2021-10-01 HISTORY — PX: LYSIS OF ADHESION: SHX5961

## 2021-10-01 LAB — CBC
HCT: 33.6 % — ABNORMAL LOW (ref 39.0–52.0)
Hemoglobin: 9.9 g/dL — ABNORMAL LOW (ref 13.0–17.0)
MCH: 25.9 pg — ABNORMAL LOW (ref 26.0–34.0)
MCHC: 29.5 g/dL — ABNORMAL LOW (ref 30.0–36.0)
MCV: 88 fL (ref 80.0–100.0)
Platelets: 433 10*3/uL — ABNORMAL HIGH (ref 150–400)
RBC: 3.82 MIL/uL — ABNORMAL LOW (ref 4.22–5.81)
RDW: 15.9 % — ABNORMAL HIGH (ref 11.5–15.5)
WBC: 7.5 10*3/uL (ref 4.0–10.5)
nRBC: 0 % (ref 0.0–0.2)

## 2021-10-01 LAB — BASIC METABOLIC PANEL
Anion gap: 8 (ref 5–15)
BUN: 33 mg/dL — ABNORMAL HIGH (ref 8–23)
CO2: 24 mmol/L (ref 22–32)
Calcium: 8.8 mg/dL — ABNORMAL LOW (ref 8.9–10.3)
Chloride: 109 mmol/L (ref 98–111)
Creatinine, Ser: 0.88 mg/dL (ref 0.61–1.24)
GFR, Estimated: >60 mL/min (ref >60)
Glucose, Bld: 124 mg/dL — ABNORMAL HIGH (ref 70–99)
Potassium: 3.9 mmol/L (ref 3.5–5.1)
Sodium: 141 mmol/L (ref 135–145)

## 2021-10-01 LAB — MAGNESIUM: Magnesium: 2.1 mg/dL (ref 1.7–2.4)

## 2021-10-01 LAB — PHOSPHORUS: Phosphorus: 2.7 mg/dL (ref 2.5–4.6)

## 2021-10-01 SURGERY — LAPAROTOMY, FOR LYSIS OF ADHESIONS
Anesthesia: General | Site: Abdomen

## 2021-10-01 MED ORDER — SUCCINYLCHOLINE CHLORIDE 200 MG/10ML IV SOSY
PREFILLED_SYRINGE | INTRAVENOUS | Status: AC
  Filled 2021-10-01: qty 10

## 2021-10-01 MED ORDER — PROPOFOL 10 MG/ML IV BOLUS
INTRAVENOUS | Status: AC
  Filled 2021-10-01: qty 20

## 2021-10-01 MED ORDER — OXYCODONE HCL 5 MG/5ML PO SOLN: 5.0000 mg | Freq: Once | ORAL | Status: DC | PRN

## 2021-10-01 MED ORDER — CHLORHEXIDINE GLUCONATE 0.12 % MT SOLN
15.0000 mL | Freq: Once | OROMUCOSAL | Status: AC
  Administered 2021-10-01: 11:00:00 15 mL via OROMUCOSAL

## 2021-10-01 MED ORDER — PHENYLEPHRINE 40 MCG/ML (10ML) SYRINGE FOR IV PUSH (FOR BLOOD PRESSURE SUPPORT)
PREFILLED_SYRINGE | INTRAVENOUS | Status: DC | PRN
  Administered 2021-10-01 (×2): 80 ug via INTRAVENOUS

## 2021-10-01 MED ORDER — AMISULPRIDE (ANTIEMETIC) 5 MG/2ML IV SOLN: 10.0000 mg | Freq: Once | INTRAVENOUS | Status: DC | PRN

## 2021-10-01 MED ORDER — EPHEDRINE SULFATE-NACL 50-0.9 MG/10ML-% IV SOSY: PREFILLED_SYRINGE | INTRAVENOUS | Status: DC | PRN

## 2021-10-01 MED ORDER — ROCURONIUM BROMIDE 10 MG/ML (PF) SYRINGE
PREFILLED_SYRINGE | INTRAVENOUS | Status: AC
  Filled 2021-10-01: qty 10

## 2021-10-01 MED ORDER — FENTANYL CITRATE (PF) 250 MCG/5ML IJ SOLN
INTRAMUSCULAR | Status: AC
  Filled 2021-10-01: qty 5

## 2021-10-01 MED ORDER — PROPOFOL 10 MG/ML IV BOLUS
INTRAVENOUS | Status: DC | PRN
  Administered 2021-10-01: 200 mg via INTRAVENOUS

## 2021-10-01 MED ORDER — DEXAMETHASONE SODIUM PHOSPHATE 10 MG/ML IJ SOLN
INTRAMUSCULAR | Status: AC
  Filled 2021-10-01: qty 1

## 2021-10-01 MED ORDER — LIDOCAINE HCL 2 % IJ SOLN
INTRAMUSCULAR | Status: AC
  Filled 2021-10-01: qty 20

## 2021-10-01 MED ORDER — SODIUM CHLORIDE 0.9 % IV SOLN
INTRAVENOUS | Status: AC
  Filled 2021-10-01: qty 2

## 2021-10-01 MED ORDER — DEXAMETHASONE SODIUM PHOSPHATE 10 MG/ML IJ SOLN
INTRAMUSCULAR | Status: DC | PRN
  Administered 2021-10-01: 8 mg via INTRAVENOUS

## 2021-10-01 MED ORDER — PHENYLEPHRINE 40 MCG/ML (10ML) SYRINGE FOR IV PUSH (FOR BLOOD PRESSURE SUPPORT)
PREFILLED_SYRINGE | INTRAVENOUS | Status: AC
  Filled 2021-10-01: qty 10

## 2021-10-01 MED ORDER — ROCURONIUM BROMIDE 10 MG/ML (PF) SYRINGE
PREFILLED_SYRINGE | INTRAVENOUS | Status: DC | PRN
  Administered 2021-10-01: 20 mg via INTRAVENOUS
  Administered 2021-10-01 (×2): 10 mg via INTRAVENOUS
  Administered 2021-10-01: 40 mg via INTRAVENOUS

## 2021-10-01 MED ORDER — DEXMEDETOMIDINE (PRECEDEX) IN NS 20 MCG/5ML (4 MCG/ML) IV SYRINGE
PREFILLED_SYRINGE | INTRAVENOUS | Status: DC | PRN
  Administered 2021-10-01: 12 ug via INTRAVENOUS

## 2021-10-01 MED ORDER — FENTANYL CITRATE (PF) 250 MCG/5ML IJ SOLN
INTRAMUSCULAR | Status: DC | PRN
  Administered 2021-10-01: 50 ug via INTRAVENOUS
  Administered 2021-10-01: 25 ug via INTRAVENOUS
  Administered 2021-10-01: 100 ug via INTRAVENOUS
  Administered 2021-10-01: 50 ug via INTRAVENOUS
  Administered 2021-10-01: 25 ug via INTRAVENOUS

## 2021-10-01 MED ORDER — ONDANSETRON HCL 4 MG/2ML IJ SOLN
INTRAMUSCULAR | Status: AC
  Filled 2021-10-01: qty 2

## 2021-10-01 MED ORDER — MIDAZOLAM HCL 5 MG/5ML IJ SOLN
INTRAMUSCULAR | Status: DC | PRN
Start: 2021-10-01 — End: 2021-10-01
  Administered 2021-10-01: 1 mg via INTRAVENOUS

## 2021-10-01 MED ORDER — HYDROMORPHONE HCL 1 MG/ML IJ SOLN
0.5000 mg | INTRAMUSCULAR | Status: DC | PRN
Start: 2021-10-01 — End: 2021-10-02
  Administered 2021-10-01 – 2021-10-02 (×5): 1 mg via INTRAVENOUS
  Filled 2021-10-01 (×5): qty 1

## 2021-10-01 MED ORDER — FENTANYL CITRATE (PF) 100 MCG/2ML IJ SOLN: INTRAMUSCULAR | Status: DC | PRN

## 2021-10-01 MED ORDER — TRAVASOL 10 % IV SOLN
INTRAVENOUS | Status: AC
  Filled 2021-10-01: qty 1020

## 2021-10-01 MED ORDER — SUGAMMADEX SODIUM 200 MG/2ML IV SOLN
INTRAVENOUS | Status: DC | PRN
  Administered 2021-10-01: 140 mg via INTRAVENOUS

## 2021-10-01 MED ORDER — ACETAMINOPHEN 10 MG/ML IV SOLN: 1000.0000 mg | Freq: Once | INTRAVENOUS | Status: DC

## 2021-10-01 MED ORDER — LIDOCAINE HCL (PF) 2 % IJ SOLN
INTRAMUSCULAR | Status: AC
  Filled 2021-10-01: qty 5

## 2021-10-01 MED ORDER — PHENYLEPHRINE HCL-NACL 20-0.9 MG/250ML-% IV SOLN
INTRAVENOUS | Status: DC | PRN
  Administered 2021-10-01: 15 ug/min via INTRAVENOUS

## 2021-10-01 MED ORDER — SODIUM CHLORIDE 0.9 % IV SOLN
1.0000 g | Freq: Two times a day (BID) | INTRAVENOUS | Status: DC
  Filled 2021-10-01: qty 1

## 2021-10-01 MED ORDER — LIDOCAINE 2% (20 MG/ML) 5 ML SYRINGE
INTRAMUSCULAR | Status: DC | PRN
Start: 2021-10-01 — End: 2021-10-01
  Administered 2021-10-01: 80 mg via INTRAVENOUS
  Administered 2021-10-01: 1.5 mg/kg/h via INTRAVENOUS

## 2021-10-01 MED ORDER — SODIUM CHLORIDE 0.9 % IV SOLN
2.0000 g | Freq: Once | INTRAVENOUS | Status: AC
  Administered 2021-10-01: 22:00:00 2 g via INTRAVENOUS
  Filled 2021-10-01: qty 2

## 2021-10-01 MED ORDER — SUCCINYLCHOLINE CHLORIDE 200 MG/10ML IV SOSY
PREFILLED_SYRINGE | INTRAVENOUS | Status: DC | PRN
  Administered 2021-10-01: 80 mg via INTRAVENOUS

## 2021-10-01 MED ORDER — MIDAZOLAM HCL 2 MG/2ML IJ SOLN
INTRAMUSCULAR | Status: AC
  Filled 2021-10-01: qty 2

## 2021-10-01 MED ORDER — LACTATED RINGERS IV SOLN: INTRAVENOUS | Status: DC

## 2021-10-01 MED ORDER — FENTANYL CITRATE PF 50 MCG/ML IJ SOSY
25.0000 ug | PREFILLED_SYRINGE | INTRAMUSCULAR | Status: DC | PRN
  Administered 2021-10-01 (×3): 50 ug via INTRAVENOUS

## 2021-10-01 MED ORDER — ACETAMINOPHEN 10 MG/ML IV SOLN
INTRAVENOUS | Status: AC
  Administered 2021-10-01: 15:00:00 1000 mg
  Filled 2021-10-01: qty 100

## 2021-10-01 MED ORDER — ONDANSETRON HCL 4 MG/2ML IJ SOLN: 4.0000 mg | Freq: Once | INTRAMUSCULAR | Status: DC | PRN

## 2021-10-01 MED ORDER — ACETAMINOPHEN 10 MG/ML IV SOLN
INTRAVENOUS | Status: AC
  Filled 2021-10-01: qty 100

## 2021-10-01 MED ORDER — OXYCODONE HCL 5 MG PO TABS: 5.0000 mg | ORAL_TABLET | Freq: Once | ORAL | Status: DC | PRN

## 2021-10-01 MED ORDER — FENTANYL CITRATE PF 50 MCG/ML IJ SOSY
PREFILLED_SYRINGE | INTRAMUSCULAR | Status: AC
  Filled 2021-10-01: qty 3

## 2021-10-01 SURGICAL SUPPLY — 59 items
APL PRP STRL LF DISP 70% ISPRP (MISCELLANEOUS) ×1
BAG COUNTER SPONGE SURGICOUNT (BAG) IMPLANT
BAG SPNG CNTER NS LX DISP (BAG)
BLADE EXTENDED COATED 6.5IN (ELECTRODE) IMPLANT
BRR ADH 5X3 SEPRAFILM 6 SHT (MISCELLANEOUS) ×1
CELLS DAT CNTRL 66122 CELL SVR (MISCELLANEOUS) ×1 IMPLANT
CHLORAPREP W/TINT 26 (MISCELLANEOUS) ×2 IMPLANT
DRAIN CHANNEL 19F RND (DRAIN) ×1 IMPLANT
DRAPE LAPAROSCOPIC ABDOMINAL (DRAPES) ×2 IMPLANT
DRAPE SHEET LG 3/4 BI-LAMINATE (DRAPES) IMPLANT
DRSG OPSITE POSTOP 4X10 (GAUZE/BANDAGES/DRESSINGS) IMPLANT
DRSG OPSITE POSTOP 4X6 (GAUZE/BANDAGES/DRESSINGS) IMPLANT
DRSG OPSITE POSTOP 4X8 (GAUZE/BANDAGES/DRESSINGS) IMPLANT
ELECT REM PT RETURN 15FT ADLT (MISCELLANEOUS) ×2 IMPLANT
EVACUATOR SILICONE 100CC (DRAIN) ×1 IMPLANT
GAUZE 4X4 16PLY ~~LOC~~+RFID DBL (SPONGE) ×1 IMPLANT
GAUZE SPONGE 4X4 12PLY STRL (GAUZE/BANDAGES/DRESSINGS) ×1 IMPLANT
GLOVE SURG ENC MOIS LTX SZ6.5 (GLOVE) ×4 IMPLANT
GLOVE SURG UNDER LTX SZ6.5 (GLOVE) ×2 IMPLANT
GLOVE SURG UNDER POLY LF SZ7 (GLOVE) ×4 IMPLANT
GOWN STRL REUS W/TWL XL LVL3 (GOWN DISPOSABLE) ×6 IMPLANT
HANDLE SUCTION POOLE (INSTRUMENTS) IMPLANT
KIT TURNOVER KIT A (KITS) ×1 IMPLANT
LEGGING LITHOTOMY PAIR STRL (DRAPES) IMPLANT
LIGASURE IMPACT 36 18CM CVD LR (INSTRUMENTS) ×1 IMPLANT
PACK COLON (CUSTOM PROCEDURE TRAY) ×2 IMPLANT
PENCIL SMOKE EVACUATOR (MISCELLANEOUS) ×1 IMPLANT
RELOAD PROXIMATE 75MM BLUE (ENDOMECHANICALS) ×10 IMPLANT
RELOAD PROXIMATE TA60MM BLUE (ENDOMECHANICALS) ×2 IMPLANT
RELOAD STAPLE 60 BLU REG PROX (ENDOMECHANICALS) IMPLANT
RELOAD STAPLE 75 3.8 BLU REG (ENDOMECHANICALS) IMPLANT
RETRACTOR WND ALEXIS 18 MED (MISCELLANEOUS) IMPLANT
RETRACTOR WND ALEXIS 25 LRG (MISCELLANEOUS) IMPLANT
RTRCTR WOUND ALEXIS 18CM MED (MISCELLANEOUS) ×2
RTRCTR WOUND ALEXIS 25CM LRG (MISCELLANEOUS)
SEPRAFILM PROCEDURAL PACK 3X5 (MISCELLANEOUS) ×1 IMPLANT
STAPLER GUN LINEAR PROX 60 (STAPLE) ×1 IMPLANT
STAPLER PROXIMATE 75MM BLUE (STAPLE) ×1 IMPLANT
STAPLER VISISTAT 35W (STAPLE) ×1 IMPLANT
SUCTION POOLE HANDLE (INSTRUMENTS) ×2
SUT ETHILON 2 0 PS N (SUTURE) ×1 IMPLANT
SUT ETHILON 3 0 PS 1 (SUTURE) IMPLANT
SUT NOVA 1 T20/GS 25DT (SUTURE) ×2 IMPLANT
SUT NOVA NAB GS-21 1 T12 (SUTURE) ×2 IMPLANT
SUT PDS AB 1 CTX 36 (SUTURE) IMPLANT
SUT SILK 2 0 (SUTURE) ×2
SUT SILK 2 0 SH CR/8 (SUTURE) ×2 IMPLANT
SUT SILK 2-0 18XBRD TIE 12 (SUTURE) ×1 IMPLANT
SUT SILK 3 0 (SUTURE) ×2
SUT SILK 3 0 SH CR/8 (SUTURE) ×4 IMPLANT
SUT SILK 3-0 18XBRD TIE 12 (SUTURE) ×1 IMPLANT
SUT VIC AB 2-0 SH 18 (SUTURE) ×2 IMPLANT
SUT VIC AB 3-0 SH 18 (SUTURE) ×1 IMPLANT
SUT VICRYL 0 UR6 27IN ABS (SUTURE) ×1 IMPLANT
TAPE CLOTH SURG 4X10 WHT LF (GAUZE/BANDAGES/DRESSINGS) ×1 IMPLANT
TOWEL OR 17X26 10 PK STRL BLUE (TOWEL DISPOSABLE) ×1 IMPLANT
TOWEL OR NON WOVEN STRL DISP B (DISPOSABLE) ×2 IMPLANT
TRAY FOLEY MTR SLVR 16FR STAT (SET/KITS/TRAYS/PACK) ×2 IMPLANT
TUBING CONNECTING 10 (TUBING) ×3 IMPLANT

## 2021-10-01 NOTE — Anesthesia Preprocedure Evaluation (Signed)
Anesthesia Evaluation  Patient identified by MRN, date of birth, ID band Patient awake    Reviewed: Allergy & Precautions, NPO status , Patient's Chart, lab work & pertinent test results  History of Anesthesia Complications Negative for: history of anesthetic complications  Airway Mallampati: II  TM Distance: >3 FB Neck ROM: Full    Dental  (+) Dental Advisory Given, Teeth Intact   Pulmonary neg pulmonary ROS,    Pulmonary exam normal        Cardiovascular negative cardio ROS Normal cardiovascular exam     Neuro/Psych negative neurological ROS     GI/Hepatic Neg liver ROS, Rectal cancer s/p LAR with anastomotic leak; prolonged ileus, TPN dependent, chronically distended small bowel  CT scan 09/22/21 notes stomach distended with ingested food and multiple dilated small bowel loops   Endo/Other  negative endocrine ROS  Renal/GU negative Renal ROS  negative genitourinary   Musculoskeletal negative musculoskeletal ROS (+)   Abdominal   Peds  Hematology  (+) anemia , Hgb 6.6 on 12/12, s/p 2U PRBC, now 9.9   Anesthesia Other Findings   Reproductive/Obstetrics                            Anesthesia Physical Anesthesia Plan  ASA: 3  Anesthesia Plan: General   Post-op Pain Management: Ofirmev IV (intra-op) and Lidocaine infusion   Induction: Intravenous, Rapid sequence and Cricoid pressure planned  PONV Risk Score and Plan: 3 and Ondansetron, Dexamethasone, Treatment may vary due to age or medical condition and Midazolam  Airway Management Planned: Oral ETT  Additional Equipment: None  Intra-op Plan:   Post-operative Plan: Extubation in OR  Informed Consent: I have reviewed the patients History and Physical, chart, labs and discussed the procedure including the risks, benefits and alternatives for the proposed anesthesia with the patient or authorized representative who has indicated  his/her understanding and acceptance.     Dental advisory given  Plan Discussed with:   Anesthesia Plan Comments: ( Pt has very dilated stomach and small bowel, has been vomiting frequently, but has repeatedly refused NG tube placement. I discussed in detail with him his very high aspiration risk, and the possibility of requiring postop intubation if he were to aspirate. He continues to refuse NG tube placement preop. Will take all possible precautions to prevent aspiration on induction including RSI/cricoid pressure.)       Anesthesia Quick Evaluation

## 2021-10-01 NOTE — Transfer of Care (Signed)
Immediate Anesthesia Transfer of Care Note  Patient: Phillip Brooks  Procedure(s) Performed: LYSIS OF ADHESION (Abdomen) SMALL BOWEL RESECTION X 2 WITH END COLOSTOMY (Abdomen)  Patient Location: PACU  Anesthesia Type:General  Level of Consciousness: awake, alert , oriented and patient cooperative  Airway & Oxygen Therapy: Patient Spontanous Breathing and Patient connected to face mask oxygen  Post-op Assessment: Report given to RN, Post -op Vital signs reviewed and stable and Patient moving all extremities  Post vital signs: Reviewed and stable  Last Vitals:  Vitals Value Taken Time  BP 96/62 10/02/2021 1437  Temp    Pulse 80 09/29/2021 1441  Resp 17 09/25/2021 1441  SpO2 100 % 10/15/2021 1441  Vitals shown include unvalidated device data.  Last Pain:  Vitals:   09/19/2021 1042  TempSrc:   PainSc: 9       Patients Stated Pain Goal: 8 (98/47/30 8569)  Complications: No notable events documented.

## 2021-10-01 NOTE — Progress Notes (Signed)
Spoke to Christy,RN,received report for surgery today.

## 2021-10-01 NOTE — Op Note (Signed)
10/18/2021  2:26 PM  PATIENT:  Phillip Brooks  65 y.o. male  Patient Care Team: System, Provider Not In as PCP - General Jonnie Finner, RN (Inactive) as Oncology Nurse Navigator Ladell Pier, MD as Consulting Physician (Oncology) Gwyndolyn Kaufman, RN (Inactive) as Registered Nurse  PRE-OPERATIVE DIAGNOSIS:  SBO  POST-OPERATIVE DIAGNOSIS:  anastomotic dehiscence with dense pelvic adhesions  PROCEDURE:   LYSIS OF ADHESION SMALL BOWEL RESECTION X 2 WITH END COLOSTOMY    Surgeon(s): Leighton Ruff, MD  ASSISTANT: Dr Dema Severin   ANESTHESIA:   general  EBL:  Total I/O In: 2000 [I.V.:2000] Out: 240 [Other:140; Stool:50; Blood:50]  DRAINS: (16F) Jackson-Pratt drain(s) with closed bulb suction in the pelvis    SPECIMEN:  Source of Specimen:  small bowel  DISPOSITION OF SPECIMEN:  PATHOLOGY  COUNTS:  YES  PLAN OF CARE:  Pt already admitted  PATIENT DISPOSITION:  PACU - hemodynamically stable.  INDICATION: 65 year old male status post low anterior resection who has been in the hospital for the last 6 to 7 weeks with small bowel obstruction and inability to tolerate p.o. intake.  He has failed numerous trials to tolerate this and therefore we have decided to proceed with exploratory laparotomy.   OR FINDINGS: Anastomotic dehiscence causing dense pelvic adhesions with small bowel obstruction.  DESCRIPTION: the patient was identified in the preoperative holding area and taken to the OR where they were laid supine on the operating room table.  General anesthesia was induced without difficulty. SCDs were also noted to be in place prior to the initiation of anesthesia.  The patient was then prepped and draped in the usual sterile fashion.   A surgical timeout was performed indicating the correct patient, procedure, positioning and need for preoperative antibiotics.   I began by opening up his Pfannenstiel incision and removing the sutures.  An [place] wound protector was  placed.  I then began to bluntly mobilize the small bowel adhesions off of the bladder wall.  I dissected out several loops of small bowel using Metzenbaum scissors.  There were 2 loops of small bowel that were densely adherent to the sacrum.  These were taken down using blunt dissection.  After removing these small bowel loops, the colon anastomosis was not felt to be intact.  This was also mobilized out of the pelvis.  The distal end of the anastomosis was palpated deeper into the pelvis.  The entire anastomosis had separated circumferentially.  The colon was clamped and preserved.  We then turned our attention to inspecting the small bowel.  We ran the small bowel from the terminal ileum to the ligament of Treitz.  There were 2 areas of dense adhesions that could not be separated.  We decided to resect these as there were several serosal tears in the area.  This was done using 75 mm blue load GIA staplers to resect each limb of the small bowel and then a 75 mm blue load GIA stapler to create an anastomosis.  The common enterotomy channels were both closed using 60 mm TA stapler's blue load.  Hemostasis was achieved using interrupted 3-0 silk sutures.  The edges of the staple line were imbricated using 3-0 silk sutures and antitension suture was also placed.  The mesenteric defect was closed using a running 3-0 silk suture on both anastomoses.  Once this was completed the abdomen was inspected.  There was no other source of injury.  The left ureter appeared intact and involved in dense adhesions.  The bladder was distended but draining appropriately.  A 19 [place] Blake drain was replaced into the pelvis and brought out through the previous right lower quadrant drain site.  This was secured with a 2-0 nylon suture.  Once this was complete a incision was made in the left lower quadrant to allow for a colostomy.  The colon was brought out through this site after a cruciate incision was made in the fascia and the  rectus muscle was split.  This was then clamped into place.  The abdomen was irrigated with 2 L of warm normal saline.  Hemostasis was good.  Seprafilm was placed over the lower abdominal small bowel and into the pelvis to help prevent adhesions.  We then switched to clean gowns, gloves instruments and drapes.  The Pfannenstiel peritoneum was closed using a running 0 Vicryl suture.  The fascia was closed using two #1 Novafil sutures.  The skin was closed with staples.  A sterile dressing was applied.  The colostomy was then matured in standard [place] fashion using interrupted 3-0 Vicryl sutures.  Ostomy appliances were placed.  The patient was then awakened from anesthesia and sent to the postanesthesia care unit in stable condition.  All counts were correct per operating room staff.

## 2021-10-01 NOTE — Progress Notes (Signed)
Day of Surgery   Subjective/Chief Complaint:  ambulating in hall.  No new complaints Vital signs in last 24 hours: Temp:  [97.7 F (36.5 C)-98.8 F (37.1 C)] 97.9 F (36.6 C) (12/14 0935) Pulse Rate:  [77-88] 88 (12/14 0935) Resp:  [17-18] 18 (12/14 0935) BP: (109-124)/(73-81) 115/81 (12/14 0935) SpO2:  [98 %-100 %] 100 % (12/14 0935) Last BM Date: 09/30/21  Intake/Output from previous day: 12/13 0701 - 12/14 0700 In: 3152.4 [P.O.:240; I.V.:2912.4] Out: 4480 [Urine:1650; Drains:280; Stool:2550] Intake/Output this shift: Total I/O In: -  Out: 50 [Stool:50]  General appearance: alert, cooperative, and no distress GI: distended, non tender in lower abdomen; ostomy pink with liquid stool  Incisions OK, both JP drains with purulent bile tinged drainage  Lab Results:  Recent Labs    09/29/21 0428  WBC 6.4  HGB 6.6*  HCT 23.5*  PLT 510*    BMET Recent Labs    09/30/21 0819 09/22/2021 0424  NA 146* 141  K 3.9 3.9  CL 108 109  CO2 30 24  GLUCOSE 110* 124*  BUN 46* 33*  CREATININE 1.05 0.88  CALCIUM 9.0 8.8*    PT/INR No results for input(s): LABPROT, INR in the last 72 hours. ABG No results for input(s): PHART, HCO3 in the last 72 hours.  Invalid input(s): PCO2, PO2  Studies/Results: No results found.  Anti-infectives: Anti-infectives (From admission, onward)    Start     Dose/Rate Route Frequency Ordered Stop   09/17/21 1730  piperacillin-tazobactam (ZOSYN) IVPB 3.375 g        3.375 g 12.5 mL/hr over 240 Minutes Intravenous Every 8 hours 09/17/21 1631 09/22/21 1001   09/08/21 1400  cefTRIAXone (ROCEPHIN) 2 g in sodium chloride 0.9 % 100 mL IVPB       Note to Pharmacy: Pharmacy may adjust dosing strength / duration / interval for maximal efficacy   2 g 200 mL/hr over 30 Minutes Intravenous Every 24 hours 09/08/21 1307 09/14/21 1431   08/26/21 1000  metroNIDAZOLE (FLAGYL) IVPB 500 mg  Status:  Discontinued        500 mg 100 mL/hr over 60 Minutes  Intravenous Every 12 hours 08/26/21 0830 08/26/21 0910   08/26/21 1000  piperacillin-tazobactam (ZOSYN) IVPB 3.375 g  Status:  Discontinued        3.375 g 12.5 mL/hr over 240 Minutes Intravenous Every 8 hours 08/26/21 0910 09/01/21 0819   08/25/21 1030  metroNIDAZOLE (FLAGYL) tablet 500 mg  Status:  Discontinued        500 mg Per Tube Every 12 hours 08/25/21 0935 08/26/21 0830   08/25/21 1000  cefTRIAXone (ROCEPHIN) 2 g in sodium chloride 0.9 % 100 mL IVPB  Status:  Discontinued       Note to Pharmacy: Pharmacy may adjust dosing strength / duration / interval for maximal efficacy   2 g 200 mL/hr over 30 Minutes Intravenous Every 24 hours 08/25/21 0823 08/26/21 0910   08/25/21 1000  metroNIDAZOLE (FLAGYL) tablet 500 mg  Status:  Discontinued        500 mg Oral Every 12 hours 08/25/21 0837 08/25/21 0935   07/19/2021 2000  cefoTEtan (CEFOTAN) 2 g in sodium chloride 0.9 % 100 mL IVPB        2 g 200 mL/hr over 30 Minutes Intravenous Every 12 hours 08/10/2021 1647 08/16/2021 2046   08/06/2021 0600  cefoTEtan (CEFOTAN) 2 g in sodium chloride 0.9 % 100 mL IVPB        2 g 200  mL/hr over 30 Minutes Intravenous On call to O.R. 07/24/2021 0513 08/04/2021 0806       Assessment/Plan: s/p Procedure(s): LYSIS OF ADHESION (N/A) possible SMALL BOWEL RESECTION (N/A) Ileus: most likely related to pelvic infection and small bowel adhesion in the pelvis, NG out 11/7, AXR with continued ileus on 11/17.  Pt refusing NG replacement.  PO diet as tolerated, NGT PRN  Severe malnutrition, prolonged ileus: pt not eating well enough to stop TPN, cant tolerate a diet, cont NPO, plan for OR on Wed  Dehydration: cont MIV due to high output ileostomy  High output ileostomy: will cont fiber pills   pSBO: due to pelvic adhesions from pelvic abscess.  Pt unable to tolerate NG, IR not able to place, pt unwilling to undergo NG placement in fluoro, IR unable to place decompressive PEG tube.  Pt unable to tolerate a PO diet  Plan for  OR today and removal of small bowel stuck in his pelvis.  Most likely will need small bowel resection.  Risks of bleeding, infection, damage to adjacent structures and leak of surgical connections were discussed with the patient in detail.  All questions were answered.  He is aware an NG tube will be placed for decompression of his chronically dilated small bowel during surgery.    Anemia: most likely related to chronic illness and malnutrition.  Iron supplements ordered but pt did not tolerate this.  Iron levels low.  IV iron given   anastomotic dehiscence  - JP drainage catheter appears to be in appropriate position, Zosyn d/c'd 11/14.  wbc normal.  - Rpt CT 11/20 shows increased abscess size with drain in correct position.   restarted drain flushes.  No sign of systemic infection.  Discussed with IR on 11/21.  No further targets available for additional drainage.   7 day course of abx repeated.   - CT 11/28 fluid collection more organized but slightly smaller, IR placed TG drain 11/30, Zosyn x5 days -12/8: GGF enema shows anastomotic leak, controlled with drain  Physical deconditioning: Ambulate in hall TID, appreciate PT/OT involvement   Narcotic dependence: weaning dilaudid slowly  Urinary retention: foley replaced on Mon 11/1.  foley out again 11/7, failed voiding trial 11/9 with elevated PVR.   Foley replaced, will need urology f/u as outpatient.  May be able to retry voiding trial once pelvic abscess resolved  Pt will require LOA, since he cannot tolerate a PO diet.  Planning for this on Wed   Dispo: pt has a temporary housing plan in place at discharge in [place] with his [place].  TOC team consulted and working with housing authority to assist with long term housing.      LOS: 47 days    Rosario Adie [id number]

## 2021-10-01 NOTE — Anesthesia Procedure Notes (Signed)
Procedure Name: Intubation Date/Time: 09/19/2021 11:51 AM Performed by: Victoriano Lain, CRNA Pre-anesthesia Checklist: Patient identified, Emergency Drugs available, Suction available and Patient being monitored Patient Re-evaluated:Patient Re-evaluated prior to induction Oxygen Delivery Method: Circle system utilized Preoxygenation: Pre-oxygenation with 100% oxygen Induction Type: IV induction, Rapid sequence and Cricoid Pressure applied Laryngoscope Size: Mac and 4 Grade View: Grade I Tube type: Oral Tube size: 7.5 mm Number of attempts: 1 Airway Equipment and Method: Stylet Placement Confirmation: ETT inserted through vocal cords under direct vision, positive ETCO2 and breath sounds checked- equal and bilateral Secured at: 22 cm Tube secured with: Tape Dental Injury: Teeth and Oropharynx as per pre-operative assessment  Comments: HOB elevated to 30 degrees. PreO2. Smooth RSI with cricoid pressure by Dr Christella Hartigan. Grade 1 view. +ETCO2. BBS=. ATOI. No gastric contents seen on vocal cords.

## 2021-10-01 NOTE — Progress Notes (Signed)
Nutrition Follow-up  DOCUMENTATION CODES:   Severe malnutrition in context of chronic illness  INTERVENTION:   -TPN cycles management per Pharmacy  NUTRITION DIAGNOSIS:   Severe Malnutrition related to chronic illness, cancer and cancer related treatments as evidenced by severe fat depletion, severe muscle depletion.  Ongoing.  GOAL:   Patient will meet greater than or equal to 90% of their needs  Meeting with TPN  MONITOR:   Diet advancement, Labs, Weight trends, Skin, I & O's, Other (Comment) (TPN)  ASSESSMENT:   65 year old male who presented on 10/28 for surgery. PMH of stage III rectal cancer s/p chemotherapy and radiation.  10/28 - s/p cystoscopy with balloon dilation of bulbar urethral stricture and bilateral ureteral catheterization, lower anterior colon resection with diverting ileostomy; clear liquids 10/29 - NPO 10/30 - full liquids 10/31 - NPO 11/2 - NGT placed with significant output 11/4 - CT abdomen showing fluid collection, SBO, bladder thickening 11/5 - TPN started 11/7 - NGT removed 11/9 - CLD 11/10 - FLD 11/11 - Soft diet  11/15 - TPN stopped 11/17 - emesis x2, TPN restarted 11/30 -IR placed TG drain  Patient currently in OR. Has been unable to have diet advanced, was refusing NGT placement. Noted that NGT was placed today during surgery for decompression purposes.  TPN continues x 14 hours, providing 1958 kcals and 102g protein.  Admission weight: 131 lbs. Current weight: 132 lbs.  Medications: IV Reglan, Lactated ringers, IV Zofran Labs reviewed.  Diet Order:   Diet Order             Diet NPO time specified Except for: Ice Chips, Sips with Meds  Diet effective now                   EDUCATION NEEDS:   Education needs have been addressed  Skin:  Skin Assessment: Skin Integrity Issues: Skin Integrity Issues:: Incisions Incisions: abdomen  Last BM:  12/14 -ileostomy  Height:   Ht Readings from Last 1 Encounters:   09/29/2021 5\' 6"  (1.676 m)    Weight:   Wt Readings from Last 1 Encounters:  10/07/2021 60.1 kg    BMI:  Body mass index is 21.39 kg/m.  Estimated Nutritional Needs:   Kcal:  1950-2150  Protein:  100-110g  Fluid:  2L/day  Clayton Bibles, MS, RD, LDN Inpatient Clinical Dietitian Contact information available via Amion

## 2021-10-01 NOTE — Progress Notes (Signed)
PHARMACY - TOTAL PARENTERAL NUTRITION CONSULT NOTE   Indication: intolerance to enteral feeding  Patient Measurements: Height: 5\' 6"  (167.6 cm) Weight: 60.1 kg (132 lb 7.9 oz) IBW/kg (Calculated) : 63.8 TPN AdjBW (KG): 59.7 Body mass index is 21.39 kg/m.  Assessment:  65 yo M presents on 10/28 for eval of rectal cancer. S/P LAR and diverting ileostomy. Has been NPO for 7 days and NG tube in place with high output. Pharmacy consulted to start TPN. Stopped TPN on 11/15 but had to restart on 11/17 as patient vomited as is unable to tolerate PO.  Glucose / Insulin: No Hx DM. CBGs <400 on 14 hour cyclic TPN. No SSI required, DC SSI/CBGs 12/6 Electrolytes: Na up to 146, others WNL  Renal: SCr increased to WNL (0.88), BUN elevated but improved (33)  Hepatic: Albumin remains low; LFTs and Tbili WNL, TG 83 (12/12) Intake / Output: UOP 1650 ml/ 24 hrs, stool output increased to 2550 mL, drain output decr to 280 mL/ 24 hrs.   No NGT > Unsuccessful 12/4; IR unable to place 12/5 IVF: NS increased from 40 to 50 ml/hr 12/7 by CCS.  No administrations documented on 12/9 or 12/10.  PRBC transfusion 12/12. GI Imaging: 11/4 CT abdomen shows fluid collection, SBO, bladder thickening  11/17 KUB: Dilated small bowel loops are noted concerning for distal SBO 11/19 CT:  SBO, increase in size of gas and fluid abscess formation  11/28 CTAP: mild decrease in gas/gjuid in presacral space, no change in SBO 12/5 CTAP:  severe dilatation of small bowel loops in the abdomen concerning for ileus/obstruction 12/7 GGF enema: shows anastomotic leak, controlled with drain GI Surgeries / Procedures:  10/28: LAR/Diverting ileostomy  11/30: Successful CT-guided presacral pelvic abscess drain placement  Central access: Implanted Port TPN start date: 11/5 >> 11/15. Resumed 11/17>>  Nutritional Goals:  Cyclic TPN:  8676 mL provides 102 g of protein and 1958 kcals per day  RD Assessment: Estimated Needs Total Energy  Estimated Needs: 1950-2150 Total Protein Estimated Needs: 100-110g Total Fluid Estimated Needs: 2L/day  Current Nutrition:  NPO > clear liquid diet (11/25), NPO except ice/sips 12/12  TPN transitioned to 14 hour cyclic   Plan:  Continue 14 hour cyclic TPN with 1 hour ramp up/ramp down (78-157 ml/hr)  Electrolytes in TPN:  Na 70 mEq/L,  K 20mEq/L (increase), Ca 30mEq/L,  Mg 17mEq/L, Phos 29mmol/L.  Cl:Ac 1:1 Add standard MVI and trace elements to TPN NS increased to 50 ml/hr 12/7 by CCS. TPN labs on Mon/Thurs F/u PO intake per CCS plans.  Pt unable to tolerate NG, IR not able to place decompressive PEG tube. Noted CCS plan for surgery on Wednesday.    Peggyann Juba, PharmD, BCPS [facility] main pharmacy [phone] 09/20/2021 7:17 AM

## 2021-10-01 NOTE — Anesthesia Postprocedure Evaluation (Signed)
Anesthesia Post Note  Patient: Phillip Brooks  Procedure(s) Performed: LYSIS OF ADHESION (Abdomen) SMALL BOWEL RESECTION X 2 WITH END COLOSTOMY (Abdomen)     Patient location during evaluation: PACU Anesthesia Type: General Level of consciousness: awake and alert Pain management: pain level controlled Vital Signs Assessment: post-procedure vital signs reviewed and stable Respiratory status: spontaneous breathing, nonlabored ventilation and respiratory function stable Cardiovascular status: blood pressure returned to baseline and stable Postop Assessment: no apparent nausea or vomiting Anesthetic complications: no   No notable events documented.  Last Vitals:  Vitals:   10/07/2021 1545 09/27/2021 1606  BP: 110/69 116/69  Pulse: 86 86  Resp: 14 18  Temp:  (!) 36.4 C  SpO2: 96% 99%    Last Pain:  Vitals:   10/17/2021 1606  TempSrc: Oral  PainSc:                  Lidia Collum

## 2021-10-01 NOTE — Progress Notes (Signed)
°   10/14/2021 1100  Mobility  Activity Off unit   Pt in surgery. Hold mobility for today.

## 2021-10-02 ENCOUNTER — Encounter (HOSPITAL_COMMUNITY): Payer: Self-pay | Admitting: General Surgery

## 2021-10-02 LAB — COMPREHENSIVE METABOLIC PANEL
ALT: 13 U/L (ref 0–44)
AST: 17 U/L (ref 15–41)
Albumin: 2.3 g/dL — ABNORMAL LOW (ref 3.5–5.0)
Alkaline Phosphatase: 36 U/L — ABNORMAL LOW (ref 38–126)
Anion gap: 8 (ref 5–15)
BUN: 26 mg/dL — ABNORMAL HIGH (ref 8–23)
CO2: 18 mmol/L — ABNORMAL LOW (ref 22–32)
Calcium: 8.1 mg/dL — ABNORMAL LOW (ref 8.9–10.3)
Chloride: 112 mmol/L — ABNORMAL HIGH (ref 98–111)
Creatinine, Ser: 0.85 mg/dL (ref 0.61–1.24)
GFR, Estimated: >60 mL/min (ref >60)
Glucose, Bld: 183 mg/dL — ABNORMAL HIGH (ref 70–99)
Potassium: 4.5 mmol/L (ref 3.5–5.1)
Sodium: 138 mmol/L (ref 135–145)
Total Bilirubin: 0.3 mg/dL (ref 0.3–1.2)
Total Protein: 6.4 g/dL — ABNORMAL LOW (ref 6.5–8.1)

## 2021-10-02 LAB — CBC
HCT: 32.6 % — ABNORMAL LOW (ref 39.0–52.0)
Hemoglobin: 9.5 g/dL — ABNORMAL LOW (ref 13.0–17.0)
MCH: 25.7 pg — ABNORMAL LOW (ref 26.0–34.0)
MCHC: 29.1 g/dL — ABNORMAL LOW (ref 30.0–36.0)
MCV: 88.3 fL (ref 80.0–100.0)
Platelets: 392 10*3/uL (ref 150–400)
RBC: 3.69 MIL/uL — ABNORMAL LOW (ref 4.22–5.81)
RDW: 15.9 % — ABNORMAL HIGH (ref 11.5–15.5)
WBC: 9.7 10*3/uL (ref 4.0–10.5)
nRBC: 0 % (ref 0.0–0.2)

## 2021-10-02 LAB — MAGNESIUM: Magnesium: 1.6 mg/dL — ABNORMAL LOW (ref 1.7–2.4)

## 2021-10-02 LAB — PHOSPHORUS: Phosphorus: 1.8 mg/dL — ABNORMAL LOW (ref 2.5–4.6)

## 2021-10-02 LAB — GLUCOSE, CAPILLARY: Glucose-Capillary: 144 mg/dL — ABNORMAL HIGH (ref 70–99)

## 2021-10-02 MED ORDER — INSULIN ASPART 100 UNIT/ML IJ SOLN
0.0000 [IU] | INTRAMUSCULAR | Status: DC
  Administered 2021-10-02 – 2021-10-03 (×3): 1 [IU] via SUBCUTANEOUS
  Administered 2021-10-04: 2 [IU] via SUBCUTANEOUS
  Administered 2021-10-04 – 2021-10-05 (×2): 1 [IU] via SUBCUTANEOUS

## 2021-10-02 MED ORDER — SODIUM CHLORIDE 0.9 % IV BOLUS
500.0000 mL | Freq: Once | INTRAVENOUS | Status: AC
  Administered 2021-10-02: 500 mL via INTRAVENOUS

## 2021-10-02 MED ORDER — MAGNESIUM SULFATE 4 GM/100ML IV SOLN
4.0000 g | Freq: Once | INTRAVENOUS | Status: AC
  Administered 2021-10-02: 4 g via INTRAVENOUS
  Filled 2021-10-02: qty 100

## 2021-10-02 MED ORDER — ACETAMINOPHEN 10 MG/ML IV SOLN
1000.0000 mg | Freq: Four times a day (QID) | INTRAVENOUS | Status: AC
  Administered 2021-10-02 – 2021-10-03 (×4): 1000 mg via INTRAVENOUS
  Filled 2021-10-02 (×4): qty 100

## 2021-10-02 MED ORDER — HYDROMORPHONE HCL 1 MG/ML IJ SOLN
0.5000 mg | INTRAMUSCULAR | Status: DC | PRN
Start: 2021-10-02 — End: 2021-10-08
  Administered 2021-10-02 – 2021-10-05 (×24): 1 mg via INTRAVENOUS
  Administered 2021-10-05 (×3): 0.5 mg via INTRAVENOUS
  Administered 2021-10-05 – 2021-10-06 (×15): 1 mg via INTRAVENOUS
  Administered 2021-10-07 (×2): 0.5 mg via INTRAVENOUS
  Administered 2021-10-07 – 2021-10-08 (×10): 1 mg via INTRAVENOUS
  Filled 2021-10-02 (×54): qty 1

## 2021-10-02 MED ORDER — TRAVASOL 10 % IV SOLN
INTRAVENOUS | Status: AC
  Filled 2021-10-02: qty 1020

## 2021-10-02 MED ORDER — SODIUM PHOSPHATES 45 MMOLE/15ML IV SOLN
30.0000 mmol | Freq: Once | INTRAVENOUS | Status: AC
  Administered 2021-10-02: 30 mmol via INTRAVENOUS
  Filled 2021-10-02: qty 10

## 2021-10-02 NOTE — Consult Note (Signed)
[place] Nurse ostomy consult note Stoma type/location: New end colostomy (Dr. Marcello Moores, 12/14). Previous ileostomy (this admission, 10/28) Stomal assessment/size:  Ileostomy: 1 and 1/8, pink, prolapsed, moist End colostomy: 1 and 1/4 inch red, moist, lumen at 3 o'clock, flush with abdomen. Peristomal assessment: Both intact Treatment options for stomal/peristomal skin: skin barrier rings Output: Ileostomy: 115mls dark green effluent End colostomy:  scant serosanguinous in pouch Ostomy pouching: 2pc. 2 and 1/4 inch pouching systems with skin barrier rings Education provided:  None today, patient has been medicated is is groggy. Speech is slurred, but he tells Bedside RN assisting that "he knows all about this", then falls asleep. Enrolled patient in [facility] program: Yes (for Ileostomy), no for colostomy.  Supplies left in room for next pouch change on Monday, 12/19.  [facility] nursing team will follow, seeing twice weekly and will remain available to this patient, the nursing and medical teams.  Thanks, Maudie Flakes, MSN, RN, [place], Arther Abbott  Pager# [phone]

## 2021-10-02 NOTE — Progress Notes (Signed)
1 Day Post-Op LOA, end colostomy creation  Subjective/Chief Complaint:  ambulating in hall.  No new complaints Vital signs in last 24 hours: Temp:  [97.5 F (36.4 C)-100.6 F (38.1 C)] 98.3 F (36.8 C) (12/15 0930) Pulse Rate:  [80-127] 114 (12/15 0930) Resp:  [14-21] 16 (12/15 0930) BP: (102-142)/(67-88) 109/79 (12/15 0930) SpO2:  [94 %-100 %] 97 % (12/15 0930) Weight:  [62.6 kg] 62.6 kg (12/15 0722) Last BM Date: 10/02/21  Intake/Output from previous day: 12/14 0701 - 12/15 0700 In: 5869 [I.V.:5269; IV Piggyback:600] Out: 1610 [Urine:1550; Emesis/NG output:1050; Drains:375; Stool:100; Blood:50] Intake/Output this shift: Total I/O In: 0  Out: 480 [Urine:300; Emesis/NG output:100; Drains:30; Stool:50]  General appearance: alert, cooperative, and no distress GI: non-distended, non tender in lower abdomen; ostomy pink with liquid stool  Incisions OK, both JP drains with SS drainage  Lab Results:  Recent Labs    09/24/2021 1020 10/02/21 0431  WBC 7.5 9.7  HGB 9.9* 9.5*  HCT 33.6* 32.6*  PLT 433* 392    BMET Recent Labs    10/02/2021 0424 10/02/21 0431  NA 141 138  K 3.9 4.5  CL 109 112*  CO2 24 18*  GLUCOSE 124* 183*  BUN 33* 26*  CREATININE 0.88 0.85  CALCIUM 8.8* 8.1*    PT/INR No results for input(s): LABPROT, INR in the last 72 hours. ABG No results for input(s): PHART, HCO3 in the last 72 hours.  Invalid input(s): PCO2, PO2  Studies/Results: No results found.  Anti-infectives: Anti-infectives (From admission, onward)    Start     Dose/Rate Route Frequency Ordered Stop   10/16/2021 2200  cefoTEtan (CEFOTAN) 1 g in sodium chloride 0.9 % 100 mL IVPB  Status:  Discontinued        1 g 200 mL/hr over 30 Minutes Intravenous Every 12 hours 10/03/2021 1608 10/10/2021 1643   10/03/2021 2200  cefoTEtan (CEFOTAN) 2 g in sodium chloride 0.9 % 100 mL IVPB        2 g 200 mL/hr over 30 Minutes Intravenous  Once 09/30/2021 1644 09/21/2021 2212   09/21/2021 1158  sodium  chloride 0.9 % with cefoTEtan (CEFOTAN) ADS Med       Note to Pharmacy: Georgena Spurling W: cabinet override      10/14/2021 1158 10/02/21 0014   09/17/21 1730  piperacillin-tazobactam (ZOSYN) IVPB 3.375 g        3.375 g 12.5 mL/hr over 240 Minutes Intravenous Every 8 hours 09/17/21 1631 09/22/21 1001   09/08/21 1400  cefTRIAXone (ROCEPHIN) 2 g in sodium chloride 0.9 % 100 mL IVPB       Note to Pharmacy: Pharmacy may adjust dosing strength / duration / interval for maximal efficacy   2 g 200 mL/hr over 30 Minutes Intravenous Every 24 hours 09/08/21 1307 09/14/21 1431   08/26/21 1000  metroNIDAZOLE (FLAGYL) IVPB 500 mg  Status:  Discontinued        500 mg 100 mL/hr over 60 Minutes Intravenous Every 12 hours 08/26/21 0830 08/26/21 0910   08/26/21 1000  piperacillin-tazobactam (ZOSYN) IVPB 3.375 g  Status:  Discontinued        3.375 g 12.5 mL/hr over 240 Minutes Intravenous Every 8 hours 08/26/21 0910 09/01/21 0819   08/25/21 1030  metroNIDAZOLE (FLAGYL) tablet 500 mg  Status:  Discontinued        500 mg Per Tube Every 12 hours 08/25/21 0935 08/26/21 0830   08/25/21 1000  cefTRIAXone (ROCEPHIN) 2 g in sodium chloride 0.9 % 100  mL IVPB  Status:  Discontinued       Note to Pharmacy: Pharmacy may adjust dosing strength / duration / interval for maximal efficacy   2 g 200 mL/hr over 30 Minutes Intravenous Every 24 hours 08/25/21 0823 08/26/21 0910   08/25/21 1000  metroNIDAZOLE (FLAGYL) tablet 500 mg  Status:  Discontinued        500 mg Oral Every 12 hours 08/25/21 0837 08/25/21 0935   07/28/2021 2000  cefoTEtan (CEFOTAN) 2 g in sodium chloride 0.9 % 100 mL IVPB        2 g 200 mL/hr over 30 Minutes Intravenous Every 12 hours 07/31/2021 1647 08/01/2021 2046   07/28/2021 0600  cefoTEtan (CEFOTAN) 2 g in sodium chloride 0.9 % 100 mL IVPB        2 g 200 mL/hr over 30 Minutes Intravenous On call to O.R. 08/13/2021 9509 08/12/2021 0806       Assessment/Plan: s/p Procedure(s): LYSIS OF ADHESION (N/A) SMALL  BOWEL RESECTION X 2 WITH END COLOSTOMY (N/A) Ileus: most likely related to pelvic infection and small bowel adhesion in the pelvis, NG out 11/7, AXR with continued ileus on 11/17.  Pt refusing NG replacement.  PO diet as tolerated, NGT replaced during surgery on 12/14  Severe malnutrition, prolonged ileus: pt not eating well enough to stop TPN, cant tolerate a diet, cont NPO for now  Dehydration: cont MIV due to high output ileostomy and tachycardia  pSBO: due to pelvic adhesions from pelvic abscess.  Pt unable to tolerate NG, IR not able to place, pt unwilling to undergo NG placement in fluoro, IR unable to place decompressive PEG tube.  Pt unable to tolerate a PO diet, taken to OR 12/14 for ex lap.  Several loops of small bowel adherent to completely dehisced anastomosis.  SBR x2 with endo colostomy  Anemia: most likely related to chronic illness and malnutrition.  Iron supplements ordered but pt did not tolerate this.  Iron levels low.  IV iron given   anastomotic dehiscence  - JP drainage catheter appears to be in appropriate position, Zosyn d/c'd 11/14.  wbc normal.  - Rpt CT 11/20 shows increased abscess size with drain in correct position.   restarted drain flushes.  No sign of systemic infection.  Discussed with IR on 11/21.  No further targets available for additional drainage.   7 day course of abx repeated.   - CT 11/28 fluid collection more organized but slightly smaller, IR placed TG drain 11/30, Zosyn x5 days -12/8: GGF enema shows anastomotic leak, controlled with drain  Physical deconditioning: Ambulate in hall TID, appreciate PT/OT involvement   Narcotic dependence: dilaudid prn for pain, will wean once he is healing from surgery  Urinary retention: foley replaced on Mon 11/1.  foley out again 11/7, failed voiding trial 11/9 with elevated PVR.   Foley replaced, will need urology f/u as outpatient.  May be able to retry voiding trial once pelvic abscess resolved.  Pt appeared to  have a neurogenic bladder during ex lap  Dispo: pt has a temporary housing plan in place at discharge in [place] with his Aunt.  TOC team consulted and working with housing authority to assist with long term housing.      LOS: 48 days    Phillip Brooks 32/67/[birth year]45

## 2021-10-02 NOTE — Progress Notes (Addendum)
Referring Physician(s): [place]  Supervising Physician: Aletta Edouard  Patient Status:  [facility] - In-Phillip Brooks  Chief Complaint:  S/p lower anterior resection with diverting ostomy on 16/07/9603 Complicated by presacral pelvic abscess development s/p left TG drain placement on 54/06/8118 Further complicated by development of right-sided anast leak noted on contrast enema 12/8 S/p lysis of adhesion with small bowel ressection with general surgery on 12/14  Subjective:  Phillip Brooks sitting in bed, NAD.  Reports pain all over but feeling little bit better after the surgery yesterday.   Allergies: Patient has no known allergies.  Medications: Prior to Admission medications   Medication Sig Start Date End Date Taking? Authorizing Provider  acetaminophen (TYLENOL) 650 MG CR tablet Take 650 mg by mouth every 8 (eight) hours as needed for pain.   Yes [provider]  BISACODYL 5 MG EC tablet Take 20 mg by mouth once as needed for constipation. 07/21/21  Yes [provider]  oxyCODONE (OXY IR/ROXICODONE) 5 MG immediate release tablet Take 1 tablet (5 mg total) by mouth every 6 (six) hours as needed for severe pain. 08/11/21  Yes Owens Shark, NP  polyethylene glycol powder (GLYCOLAX/MIRALAX) 17 GM/SCOOP powder Take 238 g by mouth once as needed. Colonoscopy prep 07/21/21  Yes [provider]  capecitabine (XELODA) 500 MG tablet Take 3 tablets (1,500 mg total) by mouth 2 (two) times daily after a meal. Take Monday-Friday. Take only on days of radiation. Patient not taking: No sig reported 04/18/21   Ladell Pier, MD  metroNIDAZOLE (FLAGYL) 500 MG tablet Take 1,000 mg by mouth 3 (three) times daily. 2 day supply 07/21/21   [provider]  neomycin (MYCIFRADIN) 500 MG tablet Take 1,000 mg by mouth 3 (three) times daily. 2 day supply 07/21/21   [provider]  traMADol (ULTRAM) 50 MG tablet Take 50 mg by mouth every 12 (twelve) hours as needed for moderate pain.  06/25/21   [provider]     Vital Signs: BP 109/79 (BP Location: Left Arm)    Pulse (!) 114    Temp 98.3 F (36.8 C) (Oral)    Resp 16    Ht 5\' 6"  (1.676 m)    Wt 138 lb 0.1 oz (62.6 kg)    SpO2 97%    BMI 22.28 kg/m   Physical Exam Vitals reviewed.  Constitutional:      General: He is not in acute distress.    Appearance: He is ill-appearing.  HENT:     Head: Normocephalic and atraumatic.     Nose:     Comments: + NG tube  Pulmonary:     Effort: Pulmonary effort is normal.  Skin:    General: Skin is warm and dry.     Coloration: Skin is not jaundiced or pale.     Comments: Positive left flank drain to a suction bulb. Phillip Brooks refuses to turn due to pain, exit site not assessed.  10 ml of  blood tinged fluid noted in the bulb. Drain aspirates and flushes well.    Neurological:     Mental Status: He is alert and oriented to person, place, and time.  Psychiatric:        Behavior: Behavior normal.    Imaging: No results found.  Labs:  CBC: Recent Labs    09/05/21 0745 09/29/21 0428 09/22/2021 1020 10/02/21 0431  WBC 7.2 6.4 7.5 9.7  HGB 8.0* 6.6* 9.9* 9.5*  HCT 25.0* 23.5* 33.6* 32.6*  PLT 718*  510* 433* 392    COAGS: Recent Labs    12/06/20 1706  INR 1.1*    BMP: Recent Labs    09/29/21 0428 09/30/21 0819 10/18/2021 0424 10/02/21 0431  NA 143 146* 141 138  K 4.4 3.9 3.9 4.5  CL 105 108 109 112*  CO2 28 30 24  18*  GLUCOSE 135* 110* 124* 183*  BUN 46* 46* 33* 26*  CALCIUM 8.9 9.0 8.8* 8.1*  CREATININE 1.14 1.05 0.88 0.85  GFRNONAA >60 >60 >60 >60    LIVER FUNCTION TESTS: Recent Labs    09/22/21 0427 09/25/21 0441 09/29/21 0428 10/02/21 0431  BILITOT 0.2* 0.3 <0.1* 0.3  AST 12* 14* 14* 17  ALT 9 10 16 13   ALKPHOS 41 40 49 36*  PROT 8.2* 8.2* 8.1 6.4*  ALBUMIN 2.9* 2.9* 2.9* 2.3*    Assessment and Plan:  S/p lower anterior resection with diverting ostomy on 01/60/1093 Complicated by presacral pelvic abscess development s/p left TG  drain placement on 23/55/7322 Further complicated by development of right-sided anast leak noted on contrast enema 12/8 S/p lysis of adhesion with end colostomy creation with CCS on 12/14    Drain Location: left TG Size: Fr size: 10 Fr Date of placement: 11/30  Currently to: Drain collection device: suction bulb 24 hour output:  Output by Drain (mL) 09/30/21 0701 - 09/30/21 1900 09/30/21 1901 - 09/25/2021 0700 09/24/2021 0701 - 09/27/2021 1900 09/20/2021 1901 - 10/02/21 0700 10/02/21 0701 - 10/02/21 1312  Closed System Drain 1 Left Buttock Bulb (JP) 10 Fr. 180 95 90 18 10  Closed System Drain 1 Right Flank Bulb (JP) 19 Fr.   127 140 20    Interval imaging/drain manipulation:  12/5: CT Abd wo - SBO  12/8: BE - Extravasation of contrast suggesting an anastomotic leak    Current examination: Flushes/aspirates easily.   Plan: Discussed with Dr. Marcello Moores, DO NOT FLUSH the drains for now.  D/c'd drain flush order, will defer drain management to CCS.    Dressing changes QD or PRN if soiled.  Call IR APP or on call IR MD if difficulty flushing or sudden change in drain output.  Repeat imaging/possible drain injection once output < 10 mL/QD (excluding flush material.)  Discharge planning: Please contact IR APP or on call IR MD prior to patient d/c to ensure appropriate follow up plans are in place. Typically patient will follow up with IR clinic 10-14 days post d/c for repeat imaging/possible drain injection. IR scheduler will contact patient with date/time of appointment. Patient will need to flush drain QD with 5 cc NS, record output QD, dressing changes every 2-3 days or earlier if soiled.   IR will continue to follow - please call with questions or concerns.  Electronically Signed: Tera Mater, PA-C 10/02/2021, 1:05 PM   I spent a total of 15 Minutes at the the patient's bedside AND on the patient's hospital floor or unit, greater than 50% of which was counseling/coordinating care for left  TG drain   This chart was dictated using voice recognition software.  Despite best efforts to proofread,  errors can occur which can change the documentation meaning.

## 2021-10-02 NOTE — Progress Notes (Signed)
Pt refuses multiple times to let RN change dressings on anterior abdomen and around posterior bilateral JP drains.  Educated pt on why we change and clean wounds.  Pt still refuses.

## 2021-10-02 NOTE — Progress Notes (Signed)
PHARMACY - TOTAL PARENTERAL NUTRITION CONSULT NOTE   Indication: intolerance to enteral feeding  Patient Measurements: Height: 5\' 6"  (167.6 cm) Weight: 60.1 kg (132 lb 7.9 oz) IBW/kg (Calculated) : 63.8 TPN AdjBW (KG): 60.1 Body mass index is 21.39 kg/m.  Assessment:  65 yo M presents on 10/28 for eval of rectal cancer. S/P LAR and diverting ileostomy. Has been NPO for 7 days and NG tube in place with high output. Pharmacy consulted to start TPN. Stopped TPN on 11/15 but had to restart on 11/17 as patient vomited as is unable to tolerate PO.  Glucose / Insulin: No Hx DM. CBGs <287 on 14 hour cyclic TPN. No SSI required, DC SSI/CBGs 12/6 - AM glucose 183 Electrolytes: Mg down 1.6, Phos 1.8, Cl up 112 Renal: SCr remains WNL (0.88), BUN elevated but improved (26)  Hepatic: Albumin remains low; LFTs and Tbili WNL TG 83 (12/12) Intake / Output: documentation may be affected by surgery UOP 1550 ml/ 24 hrs, stool output decr 100 mL, drain output increased 375 mL/ 24 hrs.   No NGT > Unsuccessful 12/4; IR unable to place 12/5. Placed in surgery 12/14 for decompression. IVF: NS increased from 40 to 50 ml/hr 12/7 by CCS.   PRBC transfusion 12/12. GI Imaging: 11/4 CT abdomen shows fluid collection, SBO, bladder thickening  11/17 KUB: Dilated small bowel loops are noted concerning for distal SBO 11/19 CT:  SBO, increase in size of gas and fluid abscess formation  11/28 CTAP: mild decrease in gas/gjuid in presacral space, no change in SBO 12/5 CTAP:  severe dilatation of small bowel loops in the abdomen concerning for ileus/obstruction 12/7 GGF enema: shows anastomotic leak, controlled with drain GI Surgeries / Procedures:  10/28: LAR/Diverting ileostomy  11/30: Successful CT-guided presacral pelvic abscess drain placement 12/14: Small bowel resection x 2 with end colostomy, LOA; placement of NGT for decompression purposes  Central access: Implanted Port TPN start date: 11/5 >> 11/15. Resumed  11/17>>  Nutritional Goals:  Cyclic TPN:  6811 mL provides 102 g of protein and 1958 kcals per day  RD Assessment: Estimated Needs Total Energy Estimated Needs: 1950-2150 Total Protein Estimated Needs: 100-110g Total Fluid Estimated Needs: 2L/day  Current Nutrition:  NPO > clear liquid diet (11/25), NPO except ice/sips 12/12  TPN transitioned to 14 hour cyclic   Plan:  Now: Magnesium sulfate 4g IV x 1, Na Phos 60mmol IV x 1  Continue 14 hour cyclic TPN with 1 hour ramp up/ramp down (78-157 ml/hr)  Electrolytes in TPN:  Na 70 mEq/L,  K 32mEq/L (decrease), Ca 80mEq/L,  Mg 39mEq/L (increase), Phos 68mmol/L (increase),  Cl:Ac 1:1 Add standard MVI and trace elements to TPN Add back CBGs/SSI s/p surgery NS 50 ml/hr 12/7 by C[facility]. TPN labs on Mon/Thurs F/u enteral intake per [facility] plans s/p surgery 12/14.   Peggyann Juba, PharmD, BCPS [facility] main pharmacy [phone] 10/02/2021 7:06 AM

## 2021-10-02 NOTE — Progress Notes (Signed)
Patient scoring Red MEWS with HR of 127, temp 100.55F, RR 21, 99% RA. Pt also with uncontrolled pain. Dr. Donne Hazel notified. See new orders. Will carry out new orders and continue to assess this patient.

## 2021-10-03 ENCOUNTER — Other Ambulatory Visit (HOSPITAL_COMMUNITY): Payer: Self-pay

## 2021-10-03 LAB — COMPREHENSIVE METABOLIC PANEL
ALT: 10 U/L (ref 0–44)
AST: 13 U/L — ABNORMAL LOW (ref 15–41)
Albumin: 2 g/dL — ABNORMAL LOW (ref 3.5–5.0)
Alkaline Phosphatase: 42 U/L (ref 38–126)
Anion gap: 7 (ref 5–15)
BUN: 26 mg/dL — ABNORMAL HIGH (ref 8–23)
CO2: 23 mmol/L (ref 22–32)
Calcium: 8.1 mg/dL — ABNORMAL LOW (ref 8.9–10.3)
Chloride: 110 mmol/L (ref 98–111)
Creatinine, Ser: 0.76 mg/dL (ref 0.61–1.24)
GFR, Estimated: >60 mL/min (ref >60)
Glucose, Bld: 151 mg/dL — ABNORMAL HIGH (ref 70–99)
Potassium: 4.3 mmol/L (ref 3.5–5.1)
Sodium: 140 mmol/L (ref 135–145)
Total Bilirubin: 0.3 mg/dL (ref 0.3–1.2)
Total Protein: 6.1 g/dL — ABNORMAL LOW (ref 6.5–8.1)

## 2021-10-03 LAB — CBC
HCT: 25.8 % — ABNORMAL LOW (ref 39.0–52.0)
Hemoglobin: 7.9 g/dL — ABNORMAL LOW (ref 13.0–17.0)
MCH: 26.2 pg (ref 26.0–34.0)
MCHC: 30.6 g/dL (ref 30.0–36.0)
MCV: 85.7 fL (ref 80.0–100.0)
Platelets: 310 10*3/uL (ref 150–400)
RBC: 3.01 MIL/uL — ABNORMAL LOW (ref 4.22–5.81)
RDW: 15.9 % — ABNORMAL HIGH (ref 11.5–15.5)
WBC: 12 10*3/uL — ABNORMAL HIGH (ref 4.0–10.5)
nRBC: 0 % (ref 0.0–0.2)

## 2021-10-03 LAB — GLUCOSE, CAPILLARY
Glucose-Capillary: 135 mg/dL — ABNORMAL HIGH (ref 70–99)
Glucose-Capillary: 139 mg/dL — ABNORMAL HIGH (ref 70–99)

## 2021-10-03 LAB — SURGICAL PATHOLOGY

## 2021-10-03 LAB — PHOSPHORUS: Phosphorus: 2.5 mg/dL (ref 2.5–4.6)

## 2021-10-03 LAB — MAGNESIUM: Magnesium: 2.5 mg/dL — ABNORMAL HIGH (ref 1.7–2.4)

## 2021-10-03 MED ORDER — TRAVASOL 10 % IV SOLN
INTRAVENOUS | Status: AC
  Filled 2021-10-03: qty 1020

## 2021-10-03 MED ORDER — ACETAMINOPHEN 10 MG/ML IV SOLN
1000.0000 mg | Freq: Four times a day (QID) | INTRAVENOUS | Status: AC
  Administered 2021-10-03 – 2021-10-04 (×4): 1000 mg via INTRAVENOUS
  Filled 2021-10-03 (×4): qty 100

## 2021-10-03 NOTE — Progress Notes (Signed)
Pt refused abdominal dressing change

## 2021-10-03 NOTE — Progress Notes (Signed)
PHARMACY - TOTAL PARENTERAL NUTRITION CONSULT NOTE   Indication: intolerance to enteral feeding  Patient Measurements: Height: 5\' 6"  (167.6 cm) Weight: 61.5 kg (135 lb 9.3 oz) IBW/kg (Calculated) : 63.8 TPN AdjBW (KG): 60.1 Body mass index is 21.88 kg/m.  Assessment:  65 yo M presents on 10/28 for eval of rectal cancer. S/P LAR and diverting ileostomy. Has been NPO for 7 days and NG tube in place with high output. Pharmacy consulted to start TPN. Stopped TPN on 11/15 but had to restart on 11/17 as patient vomited as is unable to tolerate PO.  Glucose / Insulin: No Hx DM. CBGs <314 on 14 hour cyclic TPN. No SSI required,  - DC SSI/CBGs 12/6 - Received dexamethasone 8mg  pre-op 12/14 - resume checking q12h on 12/15: 144, 135 no SSI required Electrolytes: Mg increased to 2.5 after 4g replacement yesterday, Phos 2.5 after 52mMol replacement yesterday. All others WNL including CorrCal. Renal: SCr remains WNL, BUN elevated but improved (26)  Hepatic: Albumin remains low; LFTs and Tbili WNL TG: 83 (12/12) Intake / Output:  - UOP 1500 ml / 24 hrs, liquid stool output 650 mL, JP drain output decreased 75 mL/ 24 hrs.   - NGT > Unsuccessful 12/4; IR unable to place 12/5. Placed in surgery 12/14 for decompression. IVF: NS increased from 40 to 50 ml/hr 12/7 by CCS for high output ileostomy and tachycardia.  GI Imaging: 11/4 CT abdomen shows fluid collection, SBO, bladder thickening  11/17 KUB: Dilated small bowel loops are noted concerning for distal SBO 11/19 CT:  SBO, increase in size of gas and fluid abscess formation  11/28 CTAP: mild decrease in gas/gjuid in presacral space, no change in SBO 12/5 CTAP:  severe dilatation of small bowel loops in the abdomen concerning for ileus/obstruction 12/7 GGF enema: shows anastomotic leak, controlled with drain GI Surgeries / Procedures:  10/28: LAR/Diverting ileostomy  11/30: Successful CT-guided presacral pelvic abscess drain placement 12/14: Small  bowel resection x 2 with end colostomy, LOA; placement of NGT for decompression purposes  Central access: Implanted Port TPN start date: 11/5 >> 11/15. Resumed 11/17>>  Nutritional Goals:  Cyclic TPN:  9702 mL provides 102 g of protein and 1958 kcals per day  RD Assessment: Estimated Needs Total Energy Estimated Needs: 1950-2150 Total Protein Estimated Needs: 100-110g Total Fluid Estimated Needs: 2L/day  Current Nutrition:  NPO > clear liquid diet (11/25), NPO except ice/sips 12/12  TPN transitioned to 14 hour cyclic   Plan:   Continue 14 hour cyclic TPN with 1 hour ramp up/ramp down (78-157 ml/hr)  Electrolytes in TPN:  Na 70 mEq/L,  K 68mEq/L, Ca 2mEq/L,  Mg 98mEq/L (decrease), Phos 55mmol/L,  Cl:Ac 1:1 Add standard MVI and trace elements to TPN Continue CBGs/SSI s/p surgery NS 50 ml/hr 12/7 by CCS. TPN labs on Mon/Thurs, CMET/Mg/Phos in AM  Peggyann Juba, PharmD, BCPS [facility] main pharmacy [phone] 10/03/2021 7:06 AM

## 2021-10-03 NOTE — Progress Notes (Signed)
°   10/03/21 1300  Mobility  Activity Refused mobility   Pt refused mobility. In and out of sleep. Will check back tomorrow.   [facility] Services Office: [phone]

## 2021-10-03 NOTE — Progress Notes (Signed)
2 Days Post-Op LOA, end colostomy creation  Subjective/Chief Complaint: Continues to be non-compliant with nursing staff.  Refusing dressing changes.    Vital signs in last 24 hours: Temp:  [97.2 F (36.2 C)-98.6 F (37 C)] 98.6 F (37 C) (12/16 1000) Pulse Rate:  [100-110] 100 (12/16 1000) Resp:  [16-18] 18 (12/16 1000) BP: (109-126)/(74-106) 124/81 (12/16 1000) SpO2:  [98 %-100 %] 100 % (12/16 1000) Weight:  [61.5 kg] 61.5 kg (12/16 0500) Last BM Date: 10/03/21  Intake/Output from previous day: 12/15 0701 - 12/16 0700 In: 2438.9 [I.V.:1895.6; IV Piggyback:543.3] Out: 2925 [Urine:1500; Emesis/NG output:700; Drains:75; Stool:650] Intake/Output this shift: Total I/O In: -  Out: 400 [Urine:400]  General appearance: alert, cooperative, and no distress GI: non-distended, appropriately tender in lower abdomen; ostomy pink with liquid stool, colostomy edematous but viable  Incisions OK, both JP drains with SS drainage  Lab Results:  Recent Labs    10/02/21 0431 10/03/21 0437  WBC 9.7 12.0*  HGB 9.5* 7.9*  HCT 32.6* 25.8*  PLT 392 310    BMET Recent Labs    10/02/21 0431 10/03/21 0437  NA 138 140  K 4.5 4.3  CL 112* 110  CO2 18* 23  GLUCOSE 183* 151*  BUN 26* 26*  CREATININE 0.85 0.76  CALCIUM 8.1* 8.1*    PT/INR No results for input(s): LABPROT, INR in the last 72 hours. ABG No results for input(s): PHART, HCO3 in the last 72 hours.  Invalid input(s): PCO2, PO2  Studies/Results: No results found.  Anti-infectives: Anti-infectives (From admission, onward)    Start     Dose/Rate Route Frequency Ordered Stop   10/11/2021 2200  cefoTEtan (CEFOTAN) 1 g in sodium chloride 0.9 % 100 mL IVPB  Status:  Discontinued        1 g 200 mL/hr over 30 Minutes Intravenous Every 12 hours 10/05/2021 1608 09/30/2021 1643   10/06/2021 2200  cefoTEtan (CEFOTAN) 2 g in sodium chloride 0.9 % 100 mL IVPB        2 g 200 mL/hr over 30 Minutes Intravenous  Once 10/17/2021 1644 09/24/2021  2212   10/02/2021 1158  sodium chloride 0.9 % with cefoTEtan (CEFOTAN) ADS Med       Note to Pharmacy: Georgena Spurling W: cabinet override      10/12/2021 1158 10/02/21 0014   09/17/21 1730  piperacillin-tazobactam (ZOSYN) IVPB 3.375 g        3.375 g 12.5 mL/hr over 240 Minutes Intravenous Every 8 hours 09/17/21 1631 09/22/21 1001   09/08/21 1400  cefTRIAXone (ROCEPHIN) 2 g in sodium chloride 0.9 % 100 mL IVPB       Note to Pharmacy: Pharmacy may adjust dosing strength / duration / interval for maximal efficacy   2 g 200 mL/hr over 30 Minutes Intravenous Every 24 hours 09/08/21 1307 09/14/21 1431   08/26/21 1000  metroNIDAZOLE (FLAGYL) IVPB 500 mg  Status:  Discontinued        500 mg 100 mL/hr over 60 Minutes Intravenous Every 12 hours 08/26/21 0830 08/26/21 0910   08/26/21 1000  piperacillin-tazobactam (ZOSYN) IVPB 3.375 g  Status:  Discontinued        3.375 g 12.5 mL/hr over 240 Minutes Intravenous Every 8 hours 08/26/21 0910 09/01/21 0819   08/25/21 1030  metroNIDAZOLE (FLAGYL) tablet 500 mg  Status:  Discontinued        500 mg Per Tube Every 12 hours 08/25/21 0935 08/26/21 0830   08/25/21 1000  cefTRIAXone (ROCEPHIN) 2 g in  sodium chloride 0.9 % 100 mL IVPB  Status:  Discontinued       Note to Pharmacy: Pharmacy may adjust dosing strength / duration / interval for maximal efficacy   2 g 200 mL/hr over 30 Minutes Intravenous Every 24 hours 08/25/21 0823 08/26/21 0910   08/25/21 1000  metroNIDAZOLE (FLAGYL) tablet 500 mg  Status:  Discontinued        500 mg Oral Every 12 hours 08/25/21 0837 08/25/21 0935   07/29/2021 2000  cefoTEtan (CEFOTAN) 2 g in sodium chloride 0.9 % 100 mL IVPB        2 g 200 mL/hr over 30 Minutes Intravenous Every 12 hours 07/24/2021 1647 08/17/2021 2046   08/03/2021 0600  cefoTEtan (CEFOTAN) 2 g in sodium chloride 0.9 % 100 mL IVPB        2 g 200 mL/hr over 30 Minutes Intravenous On call to O.R. 07/31/2021 1610 08/17/2021 0806       Assessment/Plan: s/p Procedure(s): LYSIS  OF ADHESION (N/A) SMALL BOWEL RESECTION X 2 WITH END COLOSTOMY (N/A) Ileus: most likely related to pelvic infection and small bowel adhesion in the pelvis, NG out 11/7, AXR with continued ileus on 11/17.  NGT replaced during surgery on 12/14, continue   Severe malnutrition, prolonged ileus: pt not eating well enough to stop TPN, cant tolerate a diet, cont NPO and NG for now  Dehydration: cont MIV due to high output ileostomy and tachycardia  pSBO: due to pelvic adhesions from pelvic abscess.  Pt unable to tolerate NG, IR not able to place, pt unwilling to undergo NG placement in fluoro, IR unable to place decompressive PEG tube.  Pt unable to tolerate a PO diet, taken to OR 12/14 for ex lap.  Several loops of small bowel adherent to completely dehisced anastomosis.  SBR x2 with endo colostomy  Anemia: most likely related to chronic illness and malnutrition.  Iron supplements ordered but pt did not tolerate this.  Iron levels low.  IV iron given   anastomotic dehiscence  - JP drainage catheter appears to be in appropriate position, Zosyn d/c'd 11/14.  wbc normal.  - Rpt CT 11/20 shows increased abscess size with drain in correct position.   restarted drain flushes.  No sign of systemic infection.  Discussed with IR on 11/21.  No further targets available for additional drainage.   7 day course of abx repeated.   - CT 11/28 fluid collection more organized but slightly smaller, IR placed TG drain 11/30, Zosyn x5 days -12/8: GGF enema shows anastomotic leak, controlled with drain  Physical deconditioning: Ambulate in hall TID, appreciate PT/OT involvement   Narcotic dependence: dilaudid prn for pain, will wean once he is healing from surgery  Urinary retention: foley replaced on Mon 11/1.  foley out again 11/7, failed voiding trial 11/9 with elevated PVR.   Foley replaced, will need urology f/u as outpatient.  May be able to retry voiding trial once pelvic abscess resolved.  Pt appeared to have a  neurogenic bladder during ex lap  Dispo: pt has a temporary housing plan in place at discharge in [place] with his Aunt.  TOC team consulted and working with housing authority to assist with long term housing.      LOS: 49 days    Phillip Brooks [id number]

## 2021-10-04 LAB — MAGNESIUM: Magnesium: 2.2 mg/dL (ref 1.7–2.4)

## 2021-10-04 LAB — COMPREHENSIVE METABOLIC PANEL
ALT: 10 U/L (ref 0–44)
AST: 12 U/L — ABNORMAL LOW (ref 15–41)
Albumin: 1.9 g/dL — ABNORMAL LOW (ref 3.5–5.0)
Alkaline Phosphatase: 41 U/L (ref 38–126)
Anion gap: 8 (ref 5–15)
BUN: 22 mg/dL (ref 8–23)
CO2: 24 mmol/L (ref 22–32)
Calcium: 8.1 mg/dL — ABNORMAL LOW (ref 8.9–10.3)
Chloride: 107 mmol/L (ref 98–111)
Creatinine, Ser: 0.68 mg/dL (ref 0.61–1.24)
GFR, Estimated: >60 mL/min (ref >60)
Glucose, Bld: 147 mg/dL — ABNORMAL HIGH (ref 70–99)
Potassium: 3.9 mmol/L (ref 3.5–5.1)
Sodium: 139 mmol/L (ref 135–145)
Total Bilirubin: 0.2 mg/dL — ABNORMAL LOW (ref 0.3–1.2)
Total Protein: 6.2 g/dL — ABNORMAL LOW (ref 6.5–8.1)

## 2021-10-04 LAB — PHOSPHORUS: Phosphorus: 2.7 mg/dL (ref 2.5–4.6)

## 2021-10-04 LAB — CBC
HCT: 23.3 % — ABNORMAL LOW (ref 39.0–52.0)
Hemoglobin: 7 g/dL — ABNORMAL LOW (ref 13.0–17.0)
MCH: 26 pg (ref 26.0–34.0)
MCHC: 30 g/dL (ref 30.0–36.0)
MCV: 86.6 fL (ref 80.0–100.0)
Platelets: 297 10*3/uL (ref 150–400)
RBC: 2.69 MIL/uL — ABNORMAL LOW (ref 4.22–5.81)
RDW: 16 % — ABNORMAL HIGH (ref 11.5–15.5)
WBC: 10.1 10*3/uL (ref 4.0–10.5)
nRBC: 0 % (ref 0.0–0.2)

## 2021-10-04 LAB — GLUCOSE, CAPILLARY
Glucose-Capillary: 158 mg/dL — ABNORMAL HIGH (ref 70–99)
Glucose-Capillary: 103 mg/dL — ABNORMAL HIGH (ref 70–99)
Glucose-Capillary: 125 mg/dL — ABNORMAL HIGH (ref 70–99)

## 2021-10-04 MED ORDER — TRAVASOL 10 % IV SOLN
INTRAVENOUS | Status: AC
  Filled 2021-10-04: qty 1020

## 2021-10-04 NOTE — Progress Notes (Signed)
3 Days Post-Op   Subjective/Chief Complaint: Complains of cramping pains that come and go   Objective: Vital signs in last 24 hours: Temp:  [97.5 F (36.4 C)-98.6 F (37 C)] 97.5 F (36.4 C) (12/17 0530) Pulse Rate:  [88-107] 88 (12/17 0530) Resp:  [16-18] 16 (12/17 0530) BP: (118-124)/(75-81) 121/75 (12/17 0530) SpO2:  [98 %-100 %] 100 % (12/17 0530) Weight:  [63.4 kg] 63.4 kg (12/17 0530) Last BM Date: 10/03/21  Intake/Output from previous day: 12/16 0701 - 12/17 0700 In: 1064.3 [P.O.:150; I.V.:814.3; IV Piggyback:100] Out: 5701 [Urine:2100; Emesis/NG output:300; Drains:90; XBLTJ:0300] Intake/Output this shift: No intake/output data recorded.  General appearance: alert and cooperative Resp: clear to auscultation bilaterally Cardio: regular rate and rhythm GI: soft, ostomy productive  Lab Results:  Recent Labs    10/03/21 0437 10/04/21 0454  WBC 12.0* 10.1  HGB 7.9* 7.0*  HCT 25.8* 23.3*  PLT 310 297   BMET Recent Labs    10/03/21 0437 10/04/21 0454  NA 140 139  K 4.3 3.9  CL 110 107  CO2 23 24  GLUCOSE 151* 147*  BUN 26* 22  CREATININE 0.76 0.68  CALCIUM 8.1* 8.1*   PT/INR No results for input(s): LABPROT, INR in the last 72 hours. ABG No results for input(s): PHART, HCO3 in the last 72 hours.  Invalid input(s): PCO2, PO2  Studies/Results: No results found.  Anti-infectives: Anti-infectives (From admission, onward)    Start     Dose/Rate Route Frequency Ordered Stop   10/09/2021 2200  cefoTEtan (CEFOTAN) 1 g in sodium chloride 0.9 % 100 mL IVPB  Status:  Discontinued        1 g 200 mL/hr over 30 Minutes Intravenous Every 12 hours 09/30/2021 1608 10/18/2021 1643   09/26/2021 2200  cefoTEtan (CEFOTAN) 2 g in sodium chloride 0.9 % 100 mL IVPB        2 g 200 mL/hr over 30 Minutes Intravenous  Once 09/28/2021 1644 10/16/2021 2212   09/18/2021 1158  sodium chloride 0.9 % with cefoTEtan (CEFOTAN) ADS Med       Note to Pharmacy: Georgena Spurling W: cabinet override       09/30/2021 1158 10/02/21 0014   09/17/21 1730  piperacillin-tazobactam (ZOSYN) IVPB 3.375 g        3.375 g 12.5 mL/hr over 240 Minutes Intravenous Every 8 hours 09/17/21 1631 09/22/21 1001   09/08/21 1400  cefTRIAXone (ROCEPHIN) 2 g in sodium chloride 0.9 % 100 mL IVPB       Note to Pharmacy: Pharmacy may adjust dosing strength / duration / interval for maximal efficacy   2 g 200 mL/hr over 30 Minutes Intravenous Every 24 hours 09/08/21 1307 09/14/21 1431   08/26/21 1000  metroNIDAZOLE (FLAGYL) IVPB 500 mg  Status:  Discontinued        500 mg 100 mL/hr over 60 Minutes Intravenous Every 12 hours 08/26/21 0830 08/26/21 0910   08/26/21 1000  piperacillin-tazobactam (ZOSYN) IVPB 3.375 g  Status:  Discontinued        3.375 g 12.5 mL/hr over 240 Minutes Intravenous Every 8 hours 08/26/21 0910 09/01/21 0819   08/25/21 1030  metroNIDAZOLE (FLAGYL) tablet 500 mg  Status:  Discontinued        500 mg Per Tube Every 12 hours 08/25/21 0935 08/26/21 0830   08/25/21 1000  cefTRIAXone (ROCEPHIN) 2 g in sodium chloride 0.9 % 100 mL IVPB  Status:  Discontinued       Note to Pharmacy: Pharmacy may adjust dosing  strength / duration / interval for maximal efficacy   2 g 200 mL/hr over 30 Minutes Intravenous Every 24 hours 08/25/21 0823 08/26/21 0910   08/25/21 1000  metroNIDAZOLE (FLAGYL) tablet 500 mg  Status:  Discontinued        500 mg Oral Every 12 hours 08/25/21 0837 08/25/21 0935   08/12/2021 2000  cefoTEtan (CEFOTAN) 2 g in sodium chloride 0.9 % 100 mL IVPB        2 g 200 mL/hr over 30 Minutes Intravenous Every 12 hours 07/23/2021 1647 07/28/2021 2046   07/28/2021 0600  cefoTEtan (CEFOTAN) 2 g in sodium chloride 0.9 % 100 mL IVPB        2 g 200 mL/hr over 30 Minutes Intravenous On call to O.R. 07/20/2021 5852 08/13/2021 0806       Assessment/Plan: s/p Procedure(s): LYSIS OF ADHESION (N/A) SMALL BOWEL RESECTION X 2 WITH END COLOSTOMY (N/A) Continue ng and bowel rest for now Ileus: most likely related  to pelvic infection and small bowel adhesion in the pelvis, NG out 11/7, AXR with continued ileus on 11/17.  NGT replaced during surgery on 12/14, continue    Severe malnutrition, prolonged ileus: pt not eating well enough to stop TPN, cant tolerate a diet, cont NPO and NG for now   Dehydration: cont MIV due to high output ileostomy and tachycardia   pSBO: due to pelvic adhesions from pelvic abscess.  Pt unable to tolerate NG, IR not able to place, pt unwilling to undergo NG placement in fluoro, IR unable to place decompressive PEG tube.  Pt unable to tolerate a PO diet, taken to OR 12/14 for ex lap.  Several loops of small bowel adherent to completely dehisced anastomosis.  SBR x2 with endo colostomy   Anemia: most likely related to chronic illness and malnutrition.  Iron supplements ordered but pt did not tolerate this.  Iron levels low.  IV iron given    anastomotic dehiscence  - JP drainage catheter appears to be in appropriate position, Zosyn d/c'd 11/14.  wbc normal.  - Rpt CT 11/20 shows increased abscess size with drain in correct position.   restarted drain flushes.  No sign of systemic infection.  Discussed with IR on 11/21.  No further targets available for additional drainage.   7 day course of abx repeated.   - CT 11/28 fluid collection more organized but slightly smaller, IR placed TG drain 11/30, Zosyn x5 days -12/8: GGF enema shows anastomotic leak, controlled with drain   Physical deconditioning: Ambulate in hall TID, appreciate PT/OT involvement    Narcotic dependence: dilaudid prn for pain, will wean once he is healing from surgery   Urinary retention: foley replaced on Mon 11/1.  foley out again 11/7, failed voiding trial 11/9 with elevated PVR.   Foley replaced, will need urology f/u as outpatient.  May be able to retry voiding trial once pelvic abscess resolved.  Pt appeared to have a neurogenic bladder during ex lap   Dispo: pt has a temporary housing plan in place at  discharge in [place] with his Aunt.  TOC team consulted and working with housing authority to assist with long term housing.    LOS: 50 days    Phillip Brooks 10/04/2021

## 2021-10-04 NOTE — Progress Notes (Signed)
PHARMACY - TOTAL PARENTERAL NUTRITION CONSULT NOTE   Indication: intolerance to enteral feeding  Patient Measurements: Height: 5\' 6"  (167.6 cm) Weight: 63.4 kg (139 lb 12.4 oz) IBW/kg (Calculated) : 63.8 TPN AdjBW (KG): 60.1 Body mass index is 22.56 kg/m.  Assessment:  65 yo M presents on 10/28 for eval of rectal cancer. S/P LAR and diverting ileostomy. Pharmacy consulted to start TPN 11/5. Stopped TPN on 11/15 but had to restart on 11/17 as patient vomited as is unable to tolerate PO.  Glucose / Insulin: No Hx DM. CBGs <119 on 14 hour cyclic TPN. CBG check and sSSI BID - DC SSI/CBGs 12/6 - Received dexamethasone 8mg  pre-op 12/14 - resume checking q12h on 12/15: 147, 103. 4 units SSI required Electrolytes: All lytes WNL including CorrCa (9.8) Renal: SCr remains WNL, BUN trended down to WNL Hepatic: Albumin remains low; LFTs WNL; Tbili slightly low TG: WNL Intake / Output:  - UOP 2100 mL/24 hrs, NG output 300 mL; stool output increased 1200 mL, JP drain output 90 mL/ 24 hrs. I/O Net: -2625 mL - NGT > Unsuccessful 12/4; IR unable to place 12/5. Placed in surgery 12/14 for decompression. IVF: NS increased from 40 to 50 ml/hr 12/7 by CCS for high output ileostomy and tachycardia.  GI Imaging: 11/4 CT abdomen shows fluid collection, SBO, bladder thickening  11/17 KUB: Dilated small bowel loops are noted concerning for distal SBO 11/19 CT:  SBO, increase in size of gas and fluid abscess formation  11/28 CTAP: mild decrease in gas/gjuid in presacral space, no change in SBO 12/5 CTAP:  severe dilatation of small bowel loops in the abdomen concerning for ileus/obstruction 12/7 GGF enema: shows anastomotic leak, controlled with drain GI Surgeries / Procedures:  10/28: LAR/Diverting ileostomy  11/30: Successful CT-guided presacral pelvic abscess drain placement 12/14: Small bowel resection x 2 with end colostomy, LOA; placement of NGT for decompression purposes  Central access: Implanted  Port TPN start date: 11/5 >> 11/15. Resumed 11/17>>  Nutritional Goals:  Cyclic TPN:  1478 mL provides 102 g of protein and 1958 kcals per day  RD Assessment: Estimated Needs Total Energy Estimated Needs: 1950-2150 Total Protein Estimated Needs: 100-110g Total Fluid Estimated Needs: 2L/day  Current Nutrition:  NPO > clear liquid diet (11/25), NPO except ice/sips 12/12  TPN transitioned to 14 hour cyclic   Plan:   At 1800: Continue 14 hour cyclic TPN with 1 hour ramp up/ramp down (78-157 ml/hr) Electrolytes in TPN: No changes Na 70 mEq/L K 83mEq/L Ca 1mEq/L Mg 50mEq/L Phos 86mmol/L Cl:Ac 1:1 Add standard MVI and trace elements to TPN Continue CBGs/sSSI BID s/p surgery NS 50 ml/hr by CCS TPN labs on Mon/Thurs, BMP/Mg/Phos in AM  Lenis Noon, PharmD 10/04/21 9:30 AM

## 2021-10-05 LAB — BASIC METABOLIC PANEL
Anion gap: 6 (ref 5–15)
BUN: 19 mg/dL (ref 8–23)
CO2: 23 mmol/L (ref 22–32)
Calcium: 8 mg/dL — ABNORMAL LOW (ref 8.9–10.3)
Chloride: 109 mmol/L (ref 98–111)
Creatinine, Ser: 0.56 mg/dL — ABNORMAL LOW (ref 0.61–1.24)
GFR, Estimated: >60 mL/min (ref >60)
Glucose, Bld: 134 mg/dL — ABNORMAL HIGH (ref 70–99)
Potassium: 3.8 mmol/L (ref 3.5–5.1)
Sodium: 138 mmol/L (ref 135–145)

## 2021-10-05 LAB — MAGNESIUM: Magnesium: 2 mg/dL (ref 1.7–2.4)

## 2021-10-05 LAB — PHOSPHORUS: Phosphorus: 3.2 mg/dL (ref 2.5–4.6)

## 2021-10-05 LAB — GLUCOSE, CAPILLARY
Glucose-Capillary: 128 mg/dL — ABNORMAL HIGH (ref 70–99)
Glucose-Capillary: 131 mg/dL — ABNORMAL HIGH (ref 70–99)

## 2021-10-05 MED ORDER — TRAVASOL 10 % IV SOLN
INTRAVENOUS | Status: AC
  Filled 2021-10-05: qty 1020

## 2021-10-05 MED ORDER — METHOCARBAMOL 1000 MG/10ML IJ SOLN
500.0000 mg | Freq: Four times a day (QID) | INTRAVENOUS | Status: DC | PRN
  Administered 2021-10-05: 12:00:00 500 mg via INTRAVENOUS
  Filled 2021-10-05: qty 500

## 2021-10-05 NOTE — Progress Notes (Signed)
4 Days Post-Op   Subjective/Chief Complaint: Complains of cramping pain that comes and goes   Objective: Vital signs in last 24 hours: Temp:  [97.6 F (36.4 C)-98.7 F (37.1 C)] 98.7 F (37.1 C) (12/18 0551) Pulse Rate:  [89-97] 94 (12/18 0551) Resp:  [15-16] 16 (12/18 0551) BP: (119-154)/(75-87) 154/87 (12/18 0551) SpO2:  [100 %] 100 % (12/18 0551) Last BM Date: 10/05/21  Intake/Output from previous day: 12/17 0701 - 12/18 0700 In: 3360.3 [P.O.:60; I.V.:3290.3] Out: 3095 [Urine:1650; Emesis/NG output:150; Drains:45; DDUKG:2542] Intake/Output this shift: No intake/output data recorded.  General appearance: alert and cooperative Resp: clear to auscultation bilaterally Cardio: regular rate and rhythm GI: soft, mild tenderness. Ostomy productive  Lab Results:  Recent Labs    10/03/21 0437 10/04/21 0454  WBC 12.0* 10.1  HGB 7.9* 7.0*  HCT 25.8* 23.3*  PLT 310 297   BMET Recent Labs    10/04/21 0454 10/05/21 0435  NA 139 138  K 3.9 3.8  CL 107 109  CO2 24 23  GLUCOSE 147* 134*  BUN 22 19  CREATININE 0.68 0.56*  CALCIUM 8.1* 8.0*   PT/INR No results for input(s): LABPROT, INR in the last 72 hours. ABG No results for input(s): PHART, HCO3 in the last 72 hours.  Invalid input(s): PCO2, PO2  Studies/Results: No results found.  Anti-infectives: Anti-infectives (From admission, onward)    Start     Dose/Rate Route Frequency Ordered Stop   10/03/2021 2200  cefoTEtan (CEFOTAN) 1 g in sodium chloride 0.9 % 100 mL IVPB  Status:  Discontinued        1 g 200 mL/hr over 30 Minutes Intravenous Every 12 hours 10/08/2021 1608 09/21/2021 1643   09/25/2021 2200  cefoTEtan (CEFOTAN) 2 g in sodium chloride 0.9 % 100 mL IVPB        2 g 200 mL/hr over 30 Minutes Intravenous  Once 09/30/2021 1644 09/21/2021 2212   10/17/2021 1158  sodium chloride 0.9 % with cefoTEtan (CEFOTAN) ADS Med       Note to Pharmacy: Georgena Spurling W: cabinet override      09/27/2021 1158 10/02/21 0014    09/17/21 1730  piperacillin-tazobactam (ZOSYN) IVPB 3.375 g        3.375 g 12.5 mL/hr over 240 Minutes Intravenous Every 8 hours 09/17/21 1631 09/22/21 1001   09/08/21 1400  cefTRIAXone (ROCEPHIN) 2 g in sodium chloride 0.9 % 100 mL IVPB       Note to Pharmacy: Pharmacy may adjust dosing strength / duration / interval for maximal efficacy   2 g 200 mL/hr over 30 Minutes Intravenous Every 24 hours 09/08/21 1307 09/14/21 1431   08/26/21 1000  metroNIDAZOLE (FLAGYL) IVPB 500 mg  Status:  Discontinued        500 mg 100 mL/hr over 60 Minutes Intravenous Every 12 hours 08/26/21 0830 08/26/21 0910   08/26/21 1000  piperacillin-tazobactam (ZOSYN) IVPB 3.375 g  Status:  Discontinued        3.375 g 12.5 mL/hr over 240 Minutes Intravenous Every 8 hours 08/26/21 0910 09/01/21 0819   08/25/21 1030  metroNIDAZOLE (FLAGYL) tablet 500 mg  Status:  Discontinued        500 mg Per Tube Every 12 hours 08/25/21 0935 08/26/21 0830   08/25/21 1000  cefTRIAXone (ROCEPHIN) 2 g in sodium chloride 0.9 % 100 mL IVPB  Status:  Discontinued       Note to Pharmacy: Pharmacy may adjust dosing strength / duration / interval for maximal efficacy  2 g 200 mL/hr over 30 Minutes Intravenous Every 24 hours 08/25/21 0823 08/26/21 0910   08/25/21 1000  metroNIDAZOLE (FLAGYL) tablet 500 mg  Status:  Discontinued        500 mg Oral Every 12 hours 08/25/21 0837 08/25/21 0935   08/11/2021 2000  cefoTEtan (CEFOTAN) 2 g in sodium chloride 0.9 % 100 mL IVPB        2 g 200 mL/hr over 30 Minutes Intravenous Every 12 hours 08/13/2021 1647 08/16/2021 2046   08/16/2021 0600  cefoTEtan (CEFOTAN) 2 g in sodium chloride 0.9 % 100 mL IVPB        2 g 200 mL/hr over 30 Minutes Intravenous On call to O.R. 08/01/2021 [id number] 08/10/2021 0806       Assessment/Plan: s/p Procedure(s): LYSIS OF ADHESION (N/A) SMALL BOWEL RESECTION X 2 WITH END COLOSTOMY (N/A) Continue ng and bowel rest TPN for malnutrition Will add robaxin for pain Ileus: most likely  related to pelvic infection and small bowel adhesion in the pelvis, NG out 11/7, AXR with continued ileus on 11/17.  NGT replaced during surgery on 12/14, continue    Severe malnutrition, prolonged ileus: pt not eating well enough to stop TPN, cant tolerate a diet, cont NPO and NG for now   Dehydration: cont MIV due to high output ileostomy and tachycardia   pSBO: due to pelvic adhesions from pelvic abscess.  Pt unable to tolerate NG, IR not able to place, pt unwilling to undergo NG placement in fluoro, IR unable to place decompressive PEG tube.  Pt unable to tolerate a PO diet, taken to OR 12/14 for ex lap.  Several loops of small bowel adherent to completely dehisced anastomosis.  SBR x2 with endo colostomy   Anemia: most likely related to chronic illness and malnutrition.  Iron supplements ordered but pt did not tolerate this.  Iron levels low.  IV iron given    anastomotic dehiscence  - JP drainage catheter appears to be in appropriate position, Zosyn d/c'd 11/14.  wbc normal.  - Rpt CT 11/20 shows increased abscess size with drain in correct position.   restarted drain flushes.  No sign of systemic infection.  Discussed with IR on 11/21.  No further targets available for additional drainage.   7 day course of abx repeated.   - CT 11/28 fluid collection more organized but slightly smaller, IR placed TG drain 11/30, Zosyn x5 days -12/8: GGF enema shows anastomotic leak, controlled with drain   Physical deconditioning: Ambulate in hall TID, appreciate PT/OT involvement    Narcotic dependence: dilaudid prn for pain, will wean once he is healing from surgery   Urinary retention: foley replaced on Mon 11/1.  foley out again 11/7, failed voiding trial 11/9 with elevated PVR.   Foley replaced, will need urology f/u as outpatient.  May be able to retry voiding trial once pelvic abscess resolved.  Pt appeared to have a neurogenic bladder during ex lap   Dispo: pt has a temporary housing plan in  place at discharge in [place] with his Aunt.  TOC team consulted and working with housing authority to assist with long term housing.    LOS: 51 days    Autumn Messing III 10/05/2021

## 2021-10-05 NOTE — Progress Notes (Signed)
PHARMACY - TOTAL PARENTERAL NUTRITION CONSULT NOTE   Indication: intolerance to enteral feeding  Patient Measurements: Height: 5\' 6"  (167.6 cm) Weight: 63.4 kg (139 lb 12.4 oz) IBW/kg (Calculated) : 63.8 TPN AdjBW (KG): 60.1 Body mass index is 22.56 kg/m.  Assessment:  65 yo M presents on 10/28 for eval of rectal cancer. S/P LAR and diverting ileostomy. Pharmacy consulted to start TPN 11/5. Stopped TPN on 11/15 but had to restart on 11/17 as patient vomited as is unable to tolerate PO.  Glucose / Insulin: No Hx DM. CBGs <960 on 14 hour cyclic TPN.  - BG 454, 098 WNL, 1 unit SSI/24 hrs.  - DC SSI/CBGs 12/6, resumed 12/15 s/p dexamethasone x1 dose, will stop 12/18 Electrolytes: All lytes WNL including CorrCa (9.7) Renal: SCr remains WNL, BUN trended down to WNL Hepatic: Albumin remains low; LFTs WNL; Tbili slightly low TG: WNL Intake / Output:  - UOP 1650 mL/24 hrs, NG output decreasing 150 mL; stool output 1250 mL, JP drain output decreased 45 mL/ 24 hrs.  -I/O Net: +265 mL - NGT > Unsuccessful 12/4; IR unable to place 12/5. Placed in surgery 12/14 for decompression. IVF: NS increased from 40 to 50 ml/hr 12/7 by CCS for high output ileostomy and tachycardia.  GI Imaging: 11/4 CT abdomen shows fluid collection, SBO, bladder thickening  11/17 KUB: Dilated small bowel loops are noted concerning for distal SBO 11/19 CT:  SBO, increase in size of gas and fluid abscess formation  11/28 CTAP: mild decrease in gas/gjuid in presacral space, no change in SBO 12/5 CTAP:  severe dilatation of small bowel loops in the abdomen concerning for ileus/obstruction 12/7 GGF enema: shows anastomotic leak, controlled with drain GI Surgeries / Procedures:  10/28: LAR/Diverting ileostomy  11/30: Successful CT-guided presacral pelvic abscess drain placement 12/14: Small bowel resection x 2 with end colostomy, LOA; placement of NGT for decompression purposes  Central access: Implanted Port TPN start  date: 11/5 >> 11/15. Resumed 11/17>>  Nutritional Goals:  Cyclic TPN:  1191 mL provides 102 g of protein and 1958 kcals per day  RD Assessment: Estimated Needs Total Energy Estimated Needs: 1950-2150 Total Protein Estimated Needs: 100-110g Total Fluid Estimated Needs: 2L/day  Current Nutrition:  NPO > clear liquid diet (11/25), NPO except ice/sips 12/12  TPN transitioned to 14 hour cyclic   Plan:  Now: Discontinue CBG checks and SSI since BG well controlled and no history of DM  At 1800: Continue 14 hour cyclic TPN with 1 hour ramp up/ramp down (78-157 ml/hr) Electrolytes in TPN: No changes Na 70 mEq/L K 39mEq/L Ca 4mEq/L Mg 74mEq/L Phos 51mmol/L Cl:Ac 1:1 Add standard MVI and trace elements to TPN No CBG check/SSI NS 50 ml/hr by CCS TPN labs on Mon/Thurs  Lenis Noon, PharmD 10/05/21 8:46 AM

## 2021-10-06 ENCOUNTER — Other Ambulatory Visit (HOSPITAL_COMMUNITY): Payer: Self-pay

## 2021-10-06 LAB — CBC
HCT: 22.7 % — ABNORMAL LOW (ref 39.0–52.0)
Hemoglobin: 7.1 g/dL — ABNORMAL LOW (ref 13.0–17.0)
MCH: 26.5 pg (ref 26.0–34.0)
MCHC: 31.3 g/dL (ref 30.0–36.0)
MCV: 84.7 fL (ref 80.0–100.0)
Platelets: 327 10*3/uL (ref 150–400)
RBC: 2.68 MIL/uL — ABNORMAL LOW (ref 4.22–5.81)
RDW: 16 % — ABNORMAL HIGH (ref 11.5–15.5)
WBC: 9.3 10*3/uL (ref 4.0–10.5)
nRBC: 0 % (ref 0.0–0.2)

## 2021-10-06 LAB — COMPREHENSIVE METABOLIC PANEL
ALT: 22 U/L (ref 0–44)
AST: 24 U/L (ref 15–41)
Albumin: 2.1 g/dL — ABNORMAL LOW (ref 3.5–5.0)
Alkaline Phosphatase: 43 U/L (ref 38–126)
Anion gap: 6 (ref 5–15)
BUN: 17 mg/dL (ref 8–23)
CO2: 23 mmol/L (ref 22–32)
Calcium: 8.3 mg/dL — ABNORMAL LOW (ref 8.9–10.3)
Chloride: 110 mmol/L (ref 98–111)
Creatinine, Ser: 0.67 mg/dL (ref 0.61–1.24)
GFR, Estimated: >60 mL/min (ref >60)
Glucose, Bld: 146 mg/dL — ABNORMAL HIGH (ref 70–99)
Potassium: 3.6 mmol/L (ref 3.5–5.1)
Sodium: 139 mmol/L (ref 135–145)
Total Bilirubin: 0.3 mg/dL (ref 0.3–1.2)
Total Protein: 6.2 g/dL — ABNORMAL LOW (ref 6.5–8.1)

## 2021-10-06 LAB — MAGNESIUM: Magnesium: 1.9 mg/dL (ref 1.7–2.4)

## 2021-10-06 LAB — PHOSPHORUS: Phosphorus: 3.1 mg/dL (ref 2.5–4.6)

## 2021-10-06 LAB — TRIGLYCERIDES: Triglycerides: 97 mg/dL (ref <150)

## 2021-10-06 MED ORDER — DICYCLOMINE HCL 10 MG PO CAPS: 10.0000 mg | ORAL_CAPSULE | Freq: Three times a day (TID) | ORAL | Status: DC

## 2021-10-06 MED ORDER — TRAVASOL 10 % IV SOLN
INTRAVENOUS | Status: AC
  Filled 2021-10-06: qty 1020

## 2021-10-06 MED ORDER — ONDANSETRON HCL 4 MG/2ML IJ SOLN
4.0000 mg | Freq: Four times a day (QID) | INTRAMUSCULAR | Status: DC | PRN
  Administered 2021-10-07 – 2021-10-18 (×10): 4 mg via INTRAVENOUS
  Filled 2021-10-06 (×11): qty 2

## 2021-10-06 MED ORDER — ONDANSETRON HCL 4 MG PO TABS: 4.0000 mg | ORAL_TABLET | Freq: Four times a day (QID) | ORAL | Status: DC | PRN

## 2021-10-06 MED ORDER — SIMETHICONE 40 MG/0.6ML PO SUSP
40.0000 mg | Freq: Four times a day (QID) | ORAL | Status: DC | PRN
  Filled 2021-10-06: qty 0.6

## 2021-10-06 MED ORDER — DICYCLOMINE HCL 10 MG PO CAPS
10.0000 mg | ORAL_CAPSULE | Freq: Three times a day (TID) | ORAL | Status: DC
Start: 2021-10-06 — End: 2021-10-09
  Administered 2021-10-06 – 2021-10-08 (×6): 10 mg
  Filled 2021-10-06 (×7): qty 1

## 2021-10-06 MED ORDER — DIPHENHYDRAMINE HCL 12.5 MG/5ML PO ELIX: 12.5000 mg | ORAL_SOLUTION | Freq: Four times a day (QID) | ORAL | Status: DC | PRN

## 2021-10-06 NOTE — Progress Notes (Signed)
PHARMACY - TOTAL PARENTERAL NUTRITION CONSULT NOTE   Indication: intolerance to enteral feeding  Patient Measurements: Height: 5\' 6"  (167.6 cm) Weight: 63.4 kg (139 lb 12.4 oz) IBW/kg (Calculated) : 63.8 TPN AdjBW (KG): 60.1 Body mass index is 22.56 kg/m.  Assessment:  65 yo M presents on 10/28 for eval of rectal cancer. S/P LAR and diverting ileostomy. Pharmacy consulted to start TPN 11/5. Stopped TPN on 11/15 but had to restart on 11/17 as patient vomited as is unable to tolerate PO.  Glucose / Insulin: No Hx DM. CBGs <409 on 14 hour cyclic TPN.  - BG 735, 329 WNL, 1 unit SSI/24 hrs.  - DC SSI/CBGs 12/6, resumed 12/15 s/p dexamethasone x1 dose, will stop 12/18 Electrolytes: All lytes WNL including CorrCa.  K down to 3.6 Renal: SCr and BUN remain WNL Hepatic: Albumin remains low; LFTs and Tbili WNL TG: WNL Intake / Output:  - UOP 2300 mL/24 hrs, NG 500 mL; stool output 950 mL, JP drain output decreased 20 mL/ 24 hrs.  -I/O Net: -1543mL - NGT > Unsuccessful 12/4; IR unable to place 12/5. Placed in surgery 12/14 for decompression. IVF: NS increased from 40 to 50 ml/hr 12/7 by CCS for high output ileostomy and tachycardia.  GI Imaging: 11/4 CT abdomen shows fluid collection, SBO, bladder thickening  11/17 KUB: Dilated small bowel loops are noted concerning for distal SBO 11/19 CT:  SBO, increase in size of gas and fluid abscess formation  11/28 CTAP: mild decrease in gas/gjuid in presacral space, no change in SBO 12/5 CTAP:  severe dilatation of small bowel loops in the abdomen concerning for ileus/obstruction 12/7 GGF enema: shows anastomotic leak, controlled with drain GI Surgeries / Procedures:  10/28: LAR/Diverting ileostomy  11/30: Successful CT-guided presacral pelvic abscess drain placement 12/14: Small bowel resection x 2 with end colostomy, LOA; placement of NGT for decompression purposes  Central access: Implanted Port TPN start date: 11/5 >> 11/15. Resumed  11/17>>  Nutritional Goals:  Cyclic TPN:  9242 mL provides 102 g of protein and 1958 kcals per day  RD Assessment: Estimated Needs Total Energy Estimated Needs: 1950-2150 Total Protein Estimated Needs: 100-110g Total Fluid Estimated Needs: 2L/day  Current Nutrition:  NPO > CLD (11/25)> NPO except ice/sips (12/12) TPN transitioned to 12 hour cyclic (68/34)  Plan:  At 1800: Condense to 12 hour cyclic TPN with 1 hour ramp up/ramp down (2040 mL/day) Electrolytes in TPN:  Na 70 mEq/L K 60mEq/L Ca 86mEq/L Mg 43mEq/L Phos 31mmol/L Cl:Ac 1:1 Add standard MVI and trace elements to TPN No CBG check/SSI NS 50 ml/hr by CCS TPN labs on Mon/Thurs  Gretta Arab PharmD, BCPS Clinical Pharmacist [facility] main pharmacy [phone] 10/06/2021 10:00 AM

## 2021-10-06 NOTE — Progress Notes (Signed)
Mobility Specialist - Progress Note    10/06/21 1534  Oxygen Therapy  O2 Device Room Air  Mobility  Activity Turned to right side;Turned to left side;Turned to back (supine)  Range of Motion/Exercises Right arm;Left arm;Right leg;Left leg  Level of Assistance Contact guard assist, steadying assist  Assistive Device None  Mobility Response Tolerated fair  Mobility performed by Mobility specialist  $Mobility charge 1 Mobility   Upon entry pt refused ambulation, but was willing to complete bed exercises. Pt completed 5 heel slides on each leg and 10 arm raises on L and R arm. Once done, pt requested for a gown change. Afterwards, pt was left in room with NT for bed clean up.   [place] Specialist [facility] Phone: [phone] 10/06/21, 4:11 PM

## 2021-10-06 NOTE — Progress Notes (Signed)
5 Days Post-Op LOA, SBR and end colostomy creation  Subjective/Chief Complaint: Complains of crampy abd pain  Vital signs in last 24 hours: Temp:  [98 F (36.7 C)-98.9 F (37.2 C)] 98.9 F (37.2 C) (12/19 0621) Pulse Rate:  [96-100] 100 (12/19 0621) Resp:  [16-18] 18 (12/19 0621) BP: (120-148)/(75-88) 136/80 (12/19 0621) SpO2:  [99 %-100 %] 100 % (12/19 0621) Last BM Date: 10/05/21  Intake/Output from previous day: 12/18 0701 - 12/19 0700 In: 2205 [P.O.:60; I.V.:2065; NG/GT:30; IV Piggyback:50] Out: 2979 [Urine:2300; Emesis/NG output:500; Drains:20; Stool:950] Intake/Output this shift: No intake/output data recorded.  General appearance: alert, cooperative, and no distress GI: non-distended, appropriately tender in lower abdomen; ostomy pink with liquid stool, colostomy edematous but viable  Incisions OK, both JP drains with SS drainage  Lab Results:  Recent Labs    10/04/21 0454 10/06/21 0416  WBC 10.1 9.3  HGB 7.0* 7.1*  HCT 23.3* 22.7*  PLT 297 327    BMET Recent Labs    10/05/21 0435 10/06/21 0416  NA 138 139  K 3.8 3.6  CL 109 110  CO2 23 23  GLUCOSE 134* 146*  BUN 19 17  CREATININE 0.56* 0.67  CALCIUM 8.0* 8.3*    PT/INR No results for input(s): LABPROT, INR in the last 72 hours. ABG No results for input(s): PHART, HCO3 in the last 72 hours.  Invalid input(s): PCO2, PO2  Studies/Results: No results found.  Anti-infectives: Anti-infectives (From admission, onward)    Start     Dose/Rate Route Frequency Ordered Stop   09/21/2021 2200  cefoTEtan (CEFOTAN) 1 g in sodium chloride 0.9 % 100 mL IVPB  Status:  Discontinued        1 g 200 mL/hr over 30 Minutes Intravenous Every 12 hours 09/30/2021 1608 09/21/2021 1643   10/18/2021 2200  cefoTEtan (CEFOTAN) 2 g in sodium chloride 0.9 % 100 mL IVPB        2 g 200 mL/hr over 30 Minutes Intravenous  Once 10/13/2021 1644 10/07/2021 2212   09/19/2021 1158  sodium chloride 0.9 % with cefoTEtan (CEFOTAN) ADS Med        Note to Pharmacy: Georgena Spurling W: cabinet override      09/23/2021 1158 10/02/21 0014   09/17/21 1730  piperacillin-tazobactam (ZOSYN) IVPB 3.375 g        3.375 g 12.5 mL/hr over 240 Minutes Intravenous Every 8 hours 09/17/21 1631 09/22/21 1001   09/08/21 1400  cefTRIAXone (ROCEPHIN) 2 g in sodium chloride 0.9 % 100 mL IVPB       Note to Pharmacy: Pharmacy may adjust dosing strength / duration / interval for maximal efficacy   2 g 200 mL/hr over 30 Minutes Intravenous Every 24 hours 09/08/21 1307 09/14/21 1431   08/26/21 1000  metroNIDAZOLE (FLAGYL) IVPB 500 mg  Status:  Discontinued        500 mg 100 mL/hr over 60 Minutes Intravenous Every 12 hours 08/26/21 0830 08/26/21 0910   08/26/21 1000  piperacillin-tazobactam (ZOSYN) IVPB 3.375 g  Status:  Discontinued        3.375 g 12.5 mL/hr over 240 Minutes Intravenous Every 8 hours 08/26/21 0910 09/01/21 0819   08/25/21 1030  metroNIDAZOLE (FLAGYL) tablet 500 mg  Status:  Discontinued        500 mg Per Tube Every 12 hours 08/25/21 0935 08/26/21 0830   08/25/21 1000  cefTRIAXone (ROCEPHIN) 2 g in sodium chloride 0.9 % 100 mL IVPB  Status:  Discontinued  Note to Pharmacy: Pharmacy may adjust dosing strength / duration / interval for maximal efficacy   2 g 200 mL/hr over 30 Minutes Intravenous Every 24 hours 08/25/21 0823 08/26/21 0910   08/25/21 1000  metroNIDAZOLE (FLAGYL) tablet 500 mg  Status:  Discontinued        500 mg Oral Every 12 hours 08/25/21 0837 08/25/21 0935   07/28/2021 2000  cefoTEtan (CEFOTAN) 2 g in sodium chloride 0.9 % 100 mL IVPB        2 g 200 mL/hr over 30 Minutes Intravenous Every 12 hours 08/18/2021 1647 08/14/2021 2046   08/11/2021 0600  cefoTEtan (CEFOTAN) 2 g in sodium chloride 0.9 % 100 mL IVPB        2 g 200 mL/hr over 30 Minutes Intravenous On call to O.R. 08/12/2021 6644 08/01/2021 0806       Assessment/Plan: s/p Procedure(s): LYSIS OF ADHESION (N/A) SMALL BOWEL RESECTION X 2 WITH END COLOSTOMY (N/A) Ileus: most  likely related to pelvic infection and small bowel adhesion in the pelvis, NG out 11/7, AXR with continued ileus on 11/17.  NGT replaced during surgery on 12/14, continue, NG output decreasing  Severe malnutrition, prolonged ileus: pt not eating well enough to stop TPN, can't tolerate a diet, cont NPO and NG for now  Dehydration: cont MIV   pSBO: due to pelvic adhesions from pelvic abscess.  Pt unable to tolerate NG, IR not able to place, pt unwilling to undergo NG placement in fluoro, IR unable to place decompressive PEG tube.  Pt unable to tolerate a PO diet, taken to OR 12/14 for ex lap.  Several loops of small bowel adherent to completely dehisced anastomosis.  SBR x2 with endo colostomy, pathology c/w carcinomatosis   Anemia: most likely related to chronic illness and malnutrition.  Iron supplements ordered but pt did not tolerate this.  Iron levels low.  IV iron given   anastomotic dehiscence  - JP drainage catheter appears to be in appropriate position, Zosyn d/c'd 11/14.  wbc normal.  - Rpt CT 11/20 shows increased abscess size with drain in correct position.   restarted drain flushes.  No sign of systemic infection.  Discussed with IR on 11/21.  No further targets available for additional drainage.   7 day course of abx repeated.   - CT 11/28 fluid collection more organized but slightly smaller, IR placed TG drain 11/30, Zosyn x5 days -12/8: GGF enema shows anastomotic leak, controlled with drain  Physical deconditioning: Ambulate in hall TID, appreciate PT/OT involvement   Narcotic dependence: dilaudid prn for pain, will wean once he is healing from surgery  Urinary retention: foley replaced on Mon 11/1.  foley out again 11/7, failed voiding trial 11/9 with elevated PVR.   Foley replaced, will need urology f/u as outpatient.  May be able to retry voiding trial once pelvic abscess resolved.  Pt appeared to have a neurogenic bladder during ex lap, will start teaching straight cath once pt  recovers more  Dispo: pt has a temporary housing plan in place at discharge in [place] with his Aunt.  TOC team consulted and working with housing authority to assist with long term housing.      LOS: 52 days    Rosario Adie 03/47/4259

## 2021-10-07 ENCOUNTER — Telehealth: Payer: Self-pay | Admitting: *Deleted

## 2021-10-07 MED ORDER — TRAVASOL 10 % IV SOLN
INTRAVENOUS | Status: AC
  Filled 2021-10-07: qty 1020

## 2021-10-07 NOTE — Progress Notes (Signed)
6 Days Post-Op LOA, SBR and end colostomy creation  Subjective/Chief Complaint: Complains of crampy abd pain  Vital signs in last 24 hours: Temp:  [97.5 F (36.4 C)-99.4 F (37.4 C)] 97.8 F (36.6 C) (12/20 1320) Pulse Rate:  [96-110] 110 (12/20 1320) Resp:  [15-18] 16 (12/20 1320) BP: (119-128)/(73-83) 119/83 (12/20 1320) SpO2:  [99 %-100 %] 99 % (12/20 1320) Weight:  [63.1 kg] 63.1 kg (12/20 0500) Last BM Date: 10/07/21  Intake/Output from previous day: 12/19 0701 - 12/20 0700 In: 3385.5 [I.V.:3385.5] Out: 3025 [Urine:1650; Emesis/NG output:250; Drains:25; Stool:1100] Intake/Output this shift: Total I/O In: 60 [NG/GT:60] Out: 1260 [Urine:300; Emesis/NG output:100; Drains:5; Stool:855]  General appearance: alert, cooperative, and no distress GI: non-distended, appropriately tender in lower abdomen; ostomy pink with liquid stool, colostomy edematous but viable  Incisions OK, both JP drains with SS drainage  Lab Results:  Recent Labs    10/06/21 0416  WBC 9.3  HGB 7.1*  HCT 22.7*  PLT 327    BMET Recent Labs    10/05/21 0435 10/06/21 0416  NA 138 139  K 3.8 3.6  CL 109 110  CO2 23 23  GLUCOSE 134* 146*  BUN 19 17  CREATININE 0.56* 0.67  CALCIUM 8.0* 8.3*    PT/INR No results for input(s): LABPROT, INR in the last 72 hours. ABG No results for input(s): PHART, HCO3 in the last 72 hours.  Invalid input(s): PCO2, PO2  Studies/Results: No results found.  Anti-infectives: Anti-infectives (From admission, onward)    Start     Dose/Rate Route Frequency Ordered Stop   09/28/2021 2200  cefoTEtan (CEFOTAN) 1 g in sodium chloride 0.9 % 100 mL IVPB  Status:  Discontinued        1 g 200 mL/hr over 30 Minutes Intravenous Every 12 hours 09/29/2021 1608 10/07/2021 1643   10/18/2021 2200  cefoTEtan (CEFOTAN) 2 g in sodium chloride 0.9 % 100 mL IVPB        2 g 200 mL/hr over 30 Minutes Intravenous  Once 09/25/2021 1644 10/09/2021 2212   10/06/2021 1158  sodium chloride 0.9 %  with cefoTEtan (CEFOTAN) ADS Med       Note to Pharmacy: Georgena Spurling W: cabinet override      09/23/2021 1158 10/02/21 0014   09/17/21 1730  piperacillin-tazobactam (ZOSYN) IVPB 3.375 g        3.375 g 12.5 mL/hr over 240 Minutes Intravenous Every 8 hours 09/17/21 1631 09/22/21 1001   09/08/21 1400  cefTRIAXone (ROCEPHIN) 2 g in sodium chloride 0.9 % 100 mL IVPB       Note to Pharmacy: Pharmacy may adjust dosing strength / duration / interval for maximal efficacy   2 g 200 mL/hr over 30 Minutes Intravenous Every 24 hours 09/08/21 1307 09/14/21 1431   08/26/21 1000  metroNIDAZOLE (FLAGYL) IVPB 500 mg  Status:  Discontinued        500 mg 100 mL/hr over 60 Minutes Intravenous Every 12 hours 08/26/21 0830 08/26/21 0910   08/26/21 1000  piperacillin-tazobactam (ZOSYN) IVPB 3.375 g  Status:  Discontinued        3.375 g 12.5 mL/hr over 240 Minutes Intravenous Every 8 hours 08/26/21 0910 09/01/21 0819   08/25/21 1030  metroNIDAZOLE (FLAGYL) tablet 500 mg  Status:  Discontinued        500 mg Per Tube Every 12 hours 08/25/21 0935 08/26/21 0830   08/25/21 1000  cefTRIAXone (ROCEPHIN) 2 g in sodium chloride 0.9 % 100 mL IVPB  Status:  Discontinued       Note to Pharmacy: Pharmacy may adjust dosing strength / duration / interval for maximal efficacy   2 g 200 mL/hr over 30 Minutes Intravenous Every 24 hours 08/25/21 0823 08/26/21 0910   08/25/21 1000  metroNIDAZOLE (FLAGYL) tablet 500 mg  Status:  Discontinued        500 mg Oral Every 12 hours 08/25/21 0837 08/25/21 0935   08/14/2021 2000  cefoTEtan (CEFOTAN) 2 g in sodium chloride 0.9 % 100 mL IVPB        2 g 200 mL/hr over 30 Minutes Intravenous Every 12 hours 07/29/2021 1647 08/07/2021 2046   07/23/2021 0600  cefoTEtan (CEFOTAN) 2 g in sodium chloride 0.9 % 100 mL IVPB        2 g 200 mL/hr over 30 Minutes Intravenous On call to O.R. 07/22/2021 2482 07/19/2021 0806       Assessment/Plan: s/p Procedure(s): LYSIS OF ADHESION (N/A) SMALL BOWEL RESECTION X 2  WITH END COLOSTOMY (N/A) Ileus: most likely related to pelvic infection and small bowel adhesion in the pelvis, NG out 11/7, AXR with continued ileus on 11/17.  NGT replaced during surgery on 12/14, continue, NG output decreasing  Severe malnutrition, prolonged ileus: pt not eating well enough to stop TPN, can't tolerate a diet, cont NPO and NG for now  Dehydration: cont MIV   pSBO: due to pelvic adhesions from pelvic abscess.  Pt unable to tolerate NG, IR not able to place, pt unwilling to undergo NG placement in fluoro, IR unable to place decompressive PEG tube.  Pt unable to tolerate a PO diet, taken to OR 12/14 for ex lap.  Several loops of small bowel adherent to completely dehisced anastomosis.  SBR x2 with endo colostomy, pathology c/w carcinomatosis   Anemia: most likely related to chronic illness and malnutrition.  Iron supplements ordered but pt did not tolerate this.  Iron levels low.  IV iron given in early Dec   anastomotic dehiscence  - JP drainage catheter appears to be in appropriate position, Zosyn d/c'd 11/14.  wbc normal.  - Rpt CT 11/20 shows increased abscess size with drain in correct position.   restarted drain flushes.  No sign of systemic infection.  Discussed with IR on 11/21.  No further targets available for additional drainage.   7 day course of abx repeated.   - CT 11/28 fluid collection more organized but slightly smaller, IR placed TG drain 11/30, Zosyn x5 days -12/8: GGF enema shows anastomotic leak, controlled with drain  Physical deconditioning: Ambulate in hall TID, appreciate PT/OT involvement   Narcotic dependence: dilaudid prn for pain, will wean once he is healing from surgery  Urinary retention: foley replaced on Mon 11/1.  foley out again 11/7, failed voiding trial 11/9 with elevated PVR.   Foley replaced, will need urology f/u as outpatient.   Pt appeared to have a neurogenic bladder during ex lap, will remove foley and start teaching straight  cath  Dispo: pt has a temporary housing plan in place at discharge in [place] with his Aunt.  TOC team consulted and working with housing authority to assist with long term housing.      LOS: 53 days    Phillip Brooks 50/12/7046

## 2021-10-07 NOTE — Consult Note (Signed)
WOC Nurse follow up:  Stoma type/location:  New end colostomy (Dr. Marcello Moores, 12/14) Previous ileostomy (this admission, 10/28) Stomal assessment/size:  Ileostomy: 1 and 1/8, pink, prolapsed, moist End colostomy: 1 and 1/4 inch red, moist, lumen at 3 o'clock, flush with abdomen. Peristomal assessment: intact; seems to have more pain along distal aspect of each stoma with pouch removal  Treatment options for stomal/peristomal skin: skin barrier rings Output: Ileostomy: liquid green    End colostomy:  none Ostomy pouching: 2pc. 2 and 1/4 inch pouching systems with skin barrier rings Education provided:  Patient can verbalize steps for care of his ostomies.  Supplies in the room for pouch changes  Enrolled patient in [place] program: Yes  [place] Nurse will follow along with you for continued support with ostomy teaching and care [place] MSN, [place], [place], [place], [place]

## 2021-10-07 NOTE — Progress Notes (Signed)
PHARMACY - TOTAL PARENTERAL NUTRITION CONSULT NOTE   Indication: intolerance to enteral feeding  Patient Measurements: Height: 5\' 6"  (167.6 cm) Weight: 63.1 kg (139 lb 1.8 oz) IBW/kg (Calculated) : 63.8 TPN AdjBW (KG): 60.1 Body mass index is 22.45 kg/m.  Assessment:  65 yo M presents on 10/28 for eval of rectal cancer. S/P LAR and diverting ileostomy. Pharmacy consulted to start TPN 11/5. Stopped TPN on 11/15 but had to restart on 11/17 as patient vomited as is unable to tolerate PO.  Glucose / Insulin: No Hx DM. CBGs <017 on 12 hour cyclic TPN.  - DC SSI/CBGs 12/6, resumed 12/15 s/p dexamethasone x1 dose, stop 12/18 Electrolytes: All lytes WNL including CorrCa, last labs on 12/19 Renal: SCr and BUN remain WNL Hepatic: Albumin remains low; LFTs and Tbili WNL TG: WNL Intake / Output: I/O Net: +360 - UOP 1650 mL/24 hrs, NG 250 mL; stool output 1100 mL, drain 25 mL/ 24 hrs.  - NGT > Unsuccessful 12/4; IR unable to place 12/5. Placed in surgery 12/14 for decompression. IVF: NS 50 ml/hr by CCS for high output ileostomy GI Imaging: 11/4 CT abdomen shows fluid collection, SBO, bladder thickening  11/17 KUB: Dilated small bowel loops are noted concerning for distal SBO 11/19 CT:  SBO, increase in size of gas and fluid abscess formation  11/28 CTAP: mild decrease in gas/gjuid in presacral space, no change in SBO 12/5 CTAP:  severe dilatation of small bowel loops in the abdomen concerning for ileus/obstruction 12/7 GGF enema: shows anastomotic leak, controlled with drain GI Surgeries / Procedures:  10/28: LAR/Diverting ileostomy  11/30: Successful CT-guided presacral pelvic abscess drain placement 12/14: Small bowel resection x 2 with end colostomy, LOA; NGT placed for decompression   Central access: Implanted Port TPN start date: 11/5 >> 11/15. Resumed 11/17>>  Nutritional Goals:  Cyclic TPN:  7939 mL provides 102 g of protein and 1958 kcals per day  RD Assessment: Estimated  Needs Total Energy Estimated Needs: 1950-2150 Total Protein Estimated Needs: 100-110g Total Fluid Estimated Needs: 2L/day  Current Nutrition:  NPO > CLD (11/25)> NPO except ice/sips (12/12) TPN transitioned to 12 hour cyclic (03/00)  Plan:  At 1800: Continue 12 hour cyclic TPN with 1 hour ramp up/ramp down (2040 mL/day) Electrolytes in TPN:  Na 70 mEq/L K 30mEq/L Ca 69mEq/L Mg 80mEq/L Phos 24mmol/L Cl:Ac 1:1 Add standard MVI and trace elements to TPN No CBG check/SSI NS 50 ml/hr by CCS TPN labs on Mon/Thurs; bmet on 12/21  Gretta Arab PharmD, [place] Pharmacist [facility] main pharmacy [phone] 10/07/2021 8:13 AM

## 2021-10-07 NOTE — Telephone Encounter (Signed)
Oncology Discharge Planning Admission Note  [facility] Address: [place], [place], [place] [address] Hours of Operation:  8am - 5pm, Monday - Friday  Clinic Contact Information:  [phone]) [phone]  Oncology Care Team: Medical Oncologist:  Dr. Betsy Coder  Contacted Phillip Brooks via voice mail to inform that the oncology provider Dr. Benay Spice is aware of this hospital admission dated 08/10/2021, and the cancer center is following Phillip Brooks inpatient care to assist with discharge planning as indicated by the oncologist.  We will reach out to you closer to discharge date to arrange your follow up care.  Disclaimer:  This [place] note does not imply a formal consult request has been made by the admitting attending for this admission or there will be an inpatient consult completed by oncology.  Please request oncology consults as per standard process as indicated.

## 2021-10-08 ENCOUNTER — Other Ambulatory Visit (HOSPITAL_COMMUNITY): Payer: Self-pay

## 2021-10-08 LAB — CBC
HCT: 25 % — ABNORMAL LOW (ref 39.0–52.0)
Hemoglobin: 7.5 g/dL — ABNORMAL LOW (ref 13.0–17.0)
MCH: 26 pg (ref 26.0–34.0)
MCHC: 30 g/dL (ref 30.0–36.0)
MCV: 86.8 fL (ref 80.0–100.0)
Platelets: 444 10*3/uL — ABNORMAL HIGH (ref 150–400)
RBC: 2.88 MIL/uL — ABNORMAL LOW (ref 4.22–5.81)
RDW: 16.5 % — ABNORMAL HIGH (ref 11.5–15.5)
WBC: 10.8 10*3/uL — ABNORMAL HIGH (ref 4.0–10.5)
nRBC: 0 % (ref 0.0–0.2)

## 2021-10-08 LAB — BASIC METABOLIC PANEL WITH GFR
Anion gap: 9 (ref 5–15)
BUN: 24 mg/dL — ABNORMAL HIGH (ref 8–23)
CO2: 25 mmol/L (ref 22–32)
Calcium: 8.5 mg/dL — ABNORMAL LOW (ref 8.9–10.3)
Chloride: 108 mmol/L (ref 98–111)
Creatinine, Ser: 0.64 mg/dL (ref 0.61–1.24)
GFR, Estimated: >60 mL/min
Glucose, Bld: 147 mg/dL — ABNORMAL HIGH (ref 70–99)
Potassium: 3.8 mmol/L (ref 3.5–5.1)
Sodium: 142 mmol/L (ref 135–145)

## 2021-10-08 MED ORDER — TRAVASOL 10 % IV SOLN
INTRAVENOUS | Status: AC
  Filled 2021-10-08: qty 1020

## 2021-10-08 MED ORDER — HYDROMORPHONE HCL 1 MG/ML IJ SOLN
0.5000 mg | INTRAMUSCULAR | Status: DC | PRN
  Administered 2021-10-08 – 2021-10-10 (×15): 1 mg via INTRAVENOUS
  Filled 2021-10-08 (×15): qty 1

## 2021-10-08 NOTE — Progress Notes (Signed)
7 Days Post-Op LOA, SBR and end colostomy creation  Subjective/Chief Complaint: Complains of crampy abd pain and hiccups.  Not ambulating  Vital signs in last 24 hours: Temp:  [97.8 F (36.6 C)-99.2 F (37.3 C)] 98.5 F (36.9 C) (12/21 0830) Pulse Rate:  [100-127] 115 (12/21 0830) Resp:  [15-18] 18 (12/21 0830) BP: (108-125)/(71-83) 115/75 (12/21 0830) SpO2:  [97 %-100 %] 97 % (12/21 0830) Last BM Date: 10/07/21  Intake/Output from previous day: 12/20 0701 - 12/21 0700 In: 3469.2 [P.O.:180; I.V.:3229.2; NG/GT:60] Out: 1093 [Urine:1950; Emesis/NG output:550; Drains:45; ATFTD:3220] Intake/Output this shift: No intake/output data recorded.  General appearance: alert, cooperative, and no distress GI: non-distended, appropriately tender in lower abdomen; ostomy pink with liquid stool, colostomy  viable  Incisions OK, both JP drains with SS drainage  Lab Results:  Recent Labs    10/06/21 0416  WBC 9.3  HGB 7.1*  HCT 22.7*  PLT 327    BMET Recent Labs    10/06/21 0416 10/08/21 0606  NA 139 142  K 3.6 3.8  CL 110 108  CO2 23 25  GLUCOSE 146* 147*  BUN 17 24*  CREATININE 0.67 0.64  CALCIUM 8.3* 8.5*    PT/INR No results for input(s): LABPROT, INR in the last 72 hours. ABG No results for input(s): PHART, HCO3 in the last 72 hours.  Invalid input(s): PCO2, PO2  Studies/Results: No results found.  Anti-infectives: Anti-infectives (From admission, onward)    Start     Dose/Rate Route Frequency Ordered Stop   09/20/2021 2200  cefoTEtan (CEFOTAN) 1 g in sodium chloride 0.9 % 100 mL IVPB  Status:  Discontinued        1 g 200 mL/hr over 30 Minutes Intravenous Every 12 hours 10/05/2021 1608 09/29/2021 1643   09/27/2021 2200  cefoTEtan (CEFOTAN) 2 g in sodium chloride 0.9 % 100 mL IVPB        2 g 200 mL/hr over 30 Minutes Intravenous  Once 09/25/2021 1644 09/30/2021 2212   10/03/2021 1158  sodium chloride 0.9 % with cefoTEtan (CEFOTAN) ADS Med       Note to Pharmacy: Georgena Spurling W: cabinet override      09/23/2021 1158 10/02/21 0014   09/17/21 1730  piperacillin-tazobactam (ZOSYN) IVPB 3.375 g        3.375 g 12.5 mL/hr over 240 Minutes Intravenous Every 8 hours 09/17/21 1631 09/22/21 1001   09/08/21 1400  cefTRIAXone (ROCEPHIN) 2 g in sodium chloride 0.9 % 100 mL IVPB       Note to Pharmacy: Pharmacy may adjust dosing strength / duration / interval for maximal efficacy   2 g 200 mL/hr over 30 Minutes Intravenous Every 24 hours 09/08/21 1307 09/14/21 1431   08/26/21 1000  metroNIDAZOLE (FLAGYL) IVPB 500 mg  Status:  Discontinued        500 mg 100 mL/hr over 60 Minutes Intravenous Every 12 hours 08/26/21 0830 08/26/21 0910   08/26/21 1000  piperacillin-tazobactam (ZOSYN) IVPB 3.375 g  Status:  Discontinued        3.375 g 12.5 mL/hr over 240 Minutes Intravenous Every 8 hours 08/26/21 0910 09/01/21 0819   08/25/21 1030  metroNIDAZOLE (FLAGYL) tablet 500 mg  Status:  Discontinued        500 mg Per Tube Every 12 hours 08/25/21 0935 08/26/21 0830   08/25/21 1000  cefTRIAXone (ROCEPHIN) 2 g in sodium chloride 0.9 % 100 mL IVPB  Status:  Discontinued       Note to Pharmacy:  Pharmacy may adjust dosing strength / duration / interval for maximal efficacy   2 g 200 mL/hr over 30 Minutes Intravenous Every 24 hours 08/25/21 0823 08/26/21 0910   08/25/21 1000  metroNIDAZOLE (FLAGYL) tablet 500 mg  Status:  Discontinued        500 mg Oral Every 12 hours 08/25/21 0837 08/25/21 0935   07/23/2021 2000  cefoTEtan (CEFOTAN) 2 g in sodium chloride 0.9 % 100 mL IVPB        2 g 200 mL/hr over 30 Minutes Intravenous Every 12 hours 08/17/2021 1647 08/05/2021 2046   07/31/2021 0600  cefoTEtan (CEFOTAN) 2 g in sodium chloride 0.9 % 100 mL IVPB        2 g 200 mL/hr over 30 Minutes Intravenous On call to O.R. 07/28/2021 2831 08/02/2021 0806       Assessment/Plan: s/p Procedure(s): LYSIS OF ADHESION (N/A) SMALL BOWEL RESECTION X 2 WITH END COLOSTOMY (N/A) Ileus: most likely related to pelvic  infection and small bowel adhesion in the pelvis, NG out 11/7, AXR with continued ileus on 11/17.  NGT replaced during surgery on 12/14, continue, NG output decreasing, will clamp for 8h today then replace to lws  Severe malnutrition, prolonged ileus: pt not eating well enough to stop TPN, can't tolerate a diet, cont NPO and NG for now  Dehydration: cont MIV   pSBO: due to pelvic adhesions from pelvic abscess.  Pt unable to tolerate NG, IR not able to place, pt unwilling to undergo NG placement in fluoro, IR unable to place decompressive PEG tube.  Pt unable to tolerate a PO diet, taken to OR 12/14 for ex lap.  Several loops of small bowel adherent to completely dehisced anastomosis.  SBR x2 with endo colostomy, pathology c/w carcinomatosis   Anemia: most likely related to chronic illness and malnutrition.  Iron supplements ordered but pt did not tolerate this.  Iron levels low.  IV iron given in early Dec, tachy overnight, will recheck and give blood if needed  anastomotic dehiscence  - JP drainage catheter appears to be in appropriate position, Zosyn d/c'd 11/14.  wbc normal.  - Rpt CT 11/20 shows increased abscess size with drain in correct position.   restarted drain flushes.  No sign of systemic infection.  Discussed with IR on 11/21.  No further targets available for additional drainage.   7 day course of abx repeated.   - CT 11/28 fluid collection more organized but slightly smaller, IR placed TG drain 11/30, Zosyn x5 days -12/8: GGF enema shows anastomotic leak, controlled with drain - 12/14 complete dehischence of anastomosis found on ex lap.  Colostomy created  Physical deconditioning: Ambulate in hall TID, appreciate PT/OT involvement (Pt refusing most interventions)  Narcotic dependence: dilaudid prn for pain, will wean slowly  Urinary retention: foley replaced on Mon 11/1.  foley out again 11/7, failed voiding trial 11/9 with elevated PVR.   Foley replaced, will need urology f/u as  outpatient.   Pt appeared to have a neurogenic bladder during ex lap, foley out, straight cath q8h and start teaching pt to straight cath  Dispo: pt has a temporary housing plan in place at discharge in [place] with his Aunt.  TOC team consulted and working with housing authority to assist with long term housing.      LOS: 54 days    Rosario Adie 51/76/1607

## 2021-10-08 NOTE — Progress Notes (Signed)
Call to phlebotomist regarding consult for Phillip Brooks. Pt currently has TPN infusing through port. Notified labs ordered during this infusion will need to be completed via venipuncture.

## 2021-10-08 NOTE — Progress Notes (Addendum)
Nurse Tech reported patient HR 127, which made him become [place]. Patient assessed and noted to have ongoing pain, which he was recently medicated for. Attempted to page On-Call MD [place] x 2 [phone] & [phone]) with no response. Dayshift Nurse Marylin Crosby, RN made aware and advised she would make PA/MD aware when they come on the floor this morning.   [place] activated with v/s starting at 0830

## 2021-10-08 NOTE — Progress Notes (Signed)
Mobility Specialist - Progress Note    10/08/21 1057  Mobility  Activity Ambulated in room;Transferred:  Bed to chair;Transferred:  Chair to bed  Level of Assistance Contact guard assist, steadying assist  Assistive Device Front wheel walker  Distance Ambulated (ft) 5 ft  Mobility Ambulated with assistance in room  Mobility Response Tolerated poorly;RN notified  Mobility performed by Mobility specialist;Nurse tech  $Mobility charge 1 Mobility   Upon entry pt was agreeable to mobilize, but c/o of pain in abdomen. NGT clamped by RN prior to mobilizing. NT and Mobility specialist assisted pt to stand at bedside, pt required contact guard A and then ambulated 5 ft in room to sit in recliner while NT assisted with bed change. Once ready, pt ambulated from chair to bed and was left in bed with call bell at side. Pt requested NGT to be reconnected and for pain medication. RN notified.   [place] Specialist [facility] Phone: [phone] 10/08/21, 11:01 AM

## 2021-10-08 NOTE — Progress Notes (Signed)
Referring Physician(s): [place]  Supervising Physician: Arne Cleveland  Patient Status:  [facility] - In-pt  Chief Complaint:  Abdominal pain/hiccups/abscess  Subjective: Pt resting quietly in bed; drowsy but arousable; asking for some pain meds   Allergies: Patient has no known allergies.  Medications: Prior to Admission medications   Medication Sig Start Date End Date Taking? Authorizing Provider  acetaminophen (TYLENOL) 650 MG CR tablet Take 650 mg by mouth every 8 (eight) hours as needed for pain.   Yes [provider]  BISACODYL 5 MG EC tablet Take 20 mg by mouth once as needed for constipation. 07/21/21  Yes [provider]  oxyCODONE (OXY IR/ROXICODONE) 5 MG immediate release tablet Take 1 tablet (5 mg total) by mouth every 6 (six) hours as needed for severe pain. 08/11/21  Yes Owens Shark, NP  polyethylene glycol powder (GLYCOLAX/MIRALAX) 17 GM/SCOOP powder Take 238 g by mouth once as needed. Colonoscopy prep 07/21/21  Yes [provider]  capecitabine (XELODA) 500 MG tablet Take 3 tablets (1,500 mg total) by mouth 2 (two) times daily after a meal. Take Monday-Friday. Take only on days of radiation. Patient not taking: No sig reported 04/18/21   Ladell Pier, MD  metroNIDAZOLE (FLAGYL) 500 MG tablet Take 1,000 mg by mouth 3 (three) times daily. 2 day supply 07/21/21   [provider]  neomycin (MYCIFRADIN) 500 MG tablet Take 1,000 mg by mouth 3 (three) times daily. 2 day supply 07/21/21   [provider]  traMADol (ULTRAM) 50 MG tablet Take 50 mg by mouth every 12 (twelve) hours as needed for moderate pain. 06/25/21   [provider]     Vital Signs: BP 124/76 (BP Location: Left Arm)    Pulse (!) 113    Temp (!) 97.5 F (36.4 C) (Oral)    Resp 18    Ht 5\' 6"  (1.676 m)    Wt 139 lb 1.8 oz (63.1 kg)    SpO2 99%    BMI 22.45 kg/m   Physical Exam drowsy but arousable; left TG drain intact, OP 20 cc turbid reddish brown  fluid; site mildly tender  Imaging: No results found.  Labs:  CBC: Recent Labs    10/02/21 0431 10/03/21 0437 10/04/21 0454 10/06/21 0416  WBC 9.7 12.0* 10.1 9.3  HGB 9.5* 7.9* 7.0* 7.1*  HCT 32.6* 25.8* 23.3* 22.7*  PLT 392 310 297 327    COAGS: Recent Labs    12/06/20 1706  INR 1.1*    BMP: Recent Labs    10/04/21 0454 10/05/21 0435 10/06/21 0416 10/08/21 0606  NA 139 138 139 142  K 3.9 3.8 3.6 3.8  CL 107 109 110 108  CO2 24 23 23 25   GLUCOSE 147* 134* 146* 147*  BUN 22 19 17  24*  CALCIUM 8.1* 8.0* 8.3* 8.5*  CREATININE 0.68 0.56* 0.67 0.64  GFRNONAA >60 >60 >60 >60    LIVER FUNCTION TESTS: Recent Labs    10/02/21 0431 10/03/21 0437 10/04/21 0454 10/06/21 0416  BILITOT 0.3 0.3 0.2* 0.3  AST 17 13* 12* 24  ALT 13 10 10 22   ALKPHOS 36* 42 41 43  PROT 6.4* 6.1* 6.2* 6.2*  ALBUMIN 2.3* 2.0* 1.9* 2.1*    Assessment and Plan: Pt s/p lower anterior resection with diverting ostomy on 78/24/2353 Complicated by presacral pelvic abscess development s/p left 10 f TG drain placement on 61/44/3154 Further complicated by development of right-sided anast leak noted on contrast enema 12/8 S/p lysis  of adhesions with small bowel ressection with general surgery on 12/14; afebrile, cultures with E. coli and Enterococcus; last CT done 12/5; if drain OP cont to be minimal rec f/u CT later this week Output by Drain (mL) 10/06/21 0701 - 10/06/21 1900 10/06/21 1901 - 10/07/21 0700 10/07/21 0701 - 10/07/21 1900 10/07/21 1901 - 10/08/21 0700 10/08/21 0701 - 10/08/21 1455  Closed System Drain 1 Left Buttock Bulb (JP) 10 Fr. 0 20 5 10 20   Closed System Drain 1 Right Flank Bulb (JP) 19 Fr. 0 5 20 10  0      Electronically Signed: D. Rowe Robert, PA-C 10/08/2021, 2:50 PM   I spent a total of 15 minutes  at the the patient's bedside AND on the patient's hospital floor or unit, greater than 50% of which was counseling/coordinating care for pelvic abscess  drain    Patient ID: Phillip Brooks, male   DOB: 08-Jun-1956, 65 y.o.   MRN: [id number]

## 2021-10-08 NOTE — Progress Notes (Signed)
PHARMACY - TOTAL PARENTERAL NUTRITION CONSULT NOTE   Indication: intolerance to enteral feeding  Patient Measurements: Height: 5\' 6"  (167.6 cm) Weight: 63.1 kg (139 lb 1.8 oz) IBW/kg (Calculated) : 63.8 TPN AdjBW (KG): 60.1 Body mass index is 22.45 kg/m.  Assessment:  65 yo M presents on 10/28 for eval of rectal cancer. S/P LAR and diverting ileostomy. Pharmacy consulted to start TPN 11/5. Stopped TPN on 11/15 but had to restart on 11/17 as patient vomited as is unable to tolerate PO.  Glucose / Insulin: No Hx DM. CBGs <093 on 12 hour cyclic TPN.  - D/C SSI/CBGs 12/6, resumed 12/15 s/p dexamethasone x1 dose, stop 12/18 Electrolytes: All lytes WNL including CorrCa Renal: SCr WNL, BUN up to 24 Hepatic: Albumin remains low; LFTs and Tbili WNL TG: WNL Intake / Output: I/O Net: -225 - UOP 1950 mL/24 hrs, NG 550 mL; stool output 1150 mL, drain 45 mL - NGT placed in surgery 12/14 for decompression. IVF: NS 50 ml/hr by CCS for high output ileostomy GI Imaging: 11/4 CT abdomen shows fluid collection, SBO, bladder thickening  11/17 KUB: Dilated small bowel loops are noted concerning for distal SBO 11/19 CT:  SBO, increase in size of gas and fluid abscess formation  11/28 CTAP: mild decrease in gas/gjuid in presacral space, no change in SBO 12/5 CTAP:  severe dilatation of small bowel loops in the abdomen concerning for ileus/obstruction 12/7 GGF enema: shows anastomotic leak, controlled with drain GI Surgeries / Procedures:  10/28: LAR/Diverting ileostomy  11/30: Successful CT-guided presacral pelvic abscess drain placement 12/14: Small bowel resection x 2 with end colostomy, LOA; NGT placed for decompression   Central access: Implanted Port TPN start date: 11/5 >> 11/15. Resumed 11/17>>  Nutritional Goals:  Cyclic TPN:  1121 mL provides 102 g of protein and 1958 kcals per day  RD Assessment: Estimated Needs Total Energy Estimated Needs: 1950-2150 Total Protein Estimated Needs:  100-110g Total Fluid Estimated Needs: 2L/day  Current Nutrition:  NPO > CLD (11/25)> NPO except ice/sips (12/12) TPN transitioned to 12 hour cyclic (62/44)  Plan:  At 1800: Continue 12 hour cyclic TPN with 1 hour ramp up/ramp down (2040 mL/day) Electrolytes in TPN:  Na 70 mEq/L K 75mEq/L Ca 74mEq/L Mg 59mEq/L Phos 18mmol/L Cl:Ac 1:1 Add standard MVI and trace elements to TPN No CBG check/SSI NS 50 ml/hr by CCS TPN labs on Mon/Thurs  Gretta Arab PharmD, BCPS Clinical Pharmacist [facility] main pharmacy [phone] 10/08/2021 7:26 AM

## 2021-10-08 NOTE — Progress Notes (Signed)
Nutrition Follow-up  DOCUMENTATION CODES:   Severe malnutrition in context of chronic illness  INTERVENTION:   -Cyclic TPN management per Pharmacy  NUTRITION DIAGNOSIS:   Severe Malnutrition related to chronic illness, cancer and cancer related treatments as evidenced by severe fat depletion, severe muscle depletion.  Ongoing.  GOAL:   Patient will meet greater than or equal to 90% of their needs  Meeting with TPN  MONITOR:   Diet advancement, Labs, Weight trends, Skin, I & O's, Other (Comment) (TPN)  ASSESSMENT:   65 year old male who presented on 10/28 for surgery. PMH of stage III rectal cancer s/p chemotherapy and radiation.  10/28 - s/p cystoscopy with balloon dilation of bulbar urethral stricture and bilateral ureteral catheterization, lower anterior colon resection with diverting ileostomy; clear liquids 10/29 - NPO 10/30 - full liquids 10/31 - NPO 11/2 - NGT placed with significant output 11/4 - CT abdomen showing fluid collection, SBO, bladder thickening 11/5 - TPN started 11/7 - NGT removed 11/9 - CLD 11/10 - FLD 11/11 - Soft diet  11/15 - TPN stopped 11/17 - emesis x2, TPN restarted 11/30 -IR placed TG drain 12/14- s/p LOA, SB resection x 2 w/ end colostomy, NGT replaced  Patient continues to be NPO. Cyclic TPN x 12 hours continues, providing 1958 kcals and 102g protein  NGT output: 750 ml  Admission weight: 131 lbs. Current weight: 139 lbs.  Medications: IV Zofran  Labs reviewed: CBGs: 125-131 TG: 97  Diet Order:   Diet Order             Diet NPO time specified Except for: Ice Chips, Sips with Meds  Diet effective now                   EDUCATION NEEDS:   Education needs have been addressed  Skin:  Skin Assessment: Skin Integrity Issues: Skin Integrity Issues:: Incisions Incisions: abdomen  Last BM:  12/14 -ileostomy  Height:   Ht Readings from Last 1 Encounters:  10/15/2021 5\' 6"  (1.676 m)    Weight:   Wt Readings  from Last 1 Encounters:  10/07/21 63.1 kg    BMI:  Body mass index is 22.45 kg/m.  Estimated Nutritional Needs:   Kcal:  1950-2150  Protein:  100-110g  Fluid:  2L/day  Clayton Bibles, MS, RD, LDN Inpatient Clinical Dietitian Contact information available via Amion

## 2021-10-09 LAB — CBC
HCT: 24.8 % — ABNORMAL LOW (ref 39.0–52.0)
Hemoglobin: 7.3 g/dL — ABNORMAL LOW (ref 13.0–17.0)
MCH: 25.6 pg — ABNORMAL LOW (ref 26.0–34.0)
MCHC: 29.4 g/dL — ABNORMAL LOW (ref 30.0–36.0)
MCV: 87 fL (ref 80.0–100.0)
Platelets: 467 10*3/uL — ABNORMAL HIGH (ref 150–400)
RBC: 2.85 MIL/uL — ABNORMAL LOW (ref 4.22–5.81)
RDW: 16.4 % — ABNORMAL HIGH (ref 11.5–15.5)
WBC: 11.2 10*3/uL — ABNORMAL HIGH (ref 4.0–10.5)
nRBC: 0 % (ref 0.0–0.2)

## 2021-10-09 LAB — COMPREHENSIVE METABOLIC PANEL
ALT: 52 U/L — ABNORMAL HIGH (ref 0–44)
AST: 27 U/L (ref 15–41)
Albumin: 2.1 g/dL — ABNORMAL LOW (ref 3.5–5.0)
Alkaline Phosphatase: 54 U/L (ref 38–126)
Anion gap: 6 (ref 5–15)
BUN: 33 mg/dL — ABNORMAL HIGH (ref 8–23)
CO2: 27 mmol/L (ref 22–32)
Calcium: 8.2 mg/dL — ABNORMAL LOW (ref 8.9–10.3)
Chloride: 112 mmol/L — ABNORMAL HIGH (ref 98–111)
Creatinine, Ser: 0.86 mg/dL (ref 0.61–1.24)
GFR, Estimated: >60 mL/min (ref >60)
Glucose, Bld: 130 mg/dL — ABNORMAL HIGH (ref 70–99)
Potassium: 4.2 mmol/L (ref 3.5–5.1)
Sodium: 145 mmol/L (ref 135–145)
Total Bilirubin: 0.3 mg/dL (ref 0.3–1.2)
Total Protein: 6.6 g/dL (ref 6.5–8.1)

## 2021-10-09 LAB — MAGNESIUM: Magnesium: 2 mg/dL (ref 1.7–2.4)

## 2021-10-09 LAB — PHOSPHORUS: Phosphorus: 3.8 mg/dL (ref 2.5–4.6)

## 2021-10-09 MED ORDER — TRAVASOL 10 % IV SOLN
INTRAVENOUS | Status: AC
  Filled 2021-10-09: qty 1020

## 2021-10-09 NOTE — Progress Notes (Signed)
PHARMACY - TOTAL PARENTERAL NUTRITION CONSULT NOTE   Indication: intolerance to enteral feeding  Patient Measurements: Height: 5\' 6"  (167.6 cm) Weight: 65.3 kg (143 lb 15.4 oz) IBW/kg (Calculated) : 63.8 TPN AdjBW (KG): 60.1 Body mass index is 23.24 kg/m.  Assessment:  65 yo M presents on 10/28 for eval of rectal cancer. S/P LAR and diverting ileostomy. Pharmacy consulted to start TPN 11/5. Stopped TPN on 11/15 but had to restart on 11/17 as patient vomited as is unable to tolerate PO.  Glucose / Insulin: No Hx DM. CBGs <782 on 12 hour cyclic TPN.  - D/C SSI/CBGs 12/6, resumed 12/15 s/p dexamethasone x1 dose, stop 12/18 Electrolytes: All lytes WNL including CorrCa 9.7, but Na and K both increased.   - Cl elevated, CO2 wnl Renal: SCr WNL but increased to 0.86, BUN up to 33 Hepatic: Albumin remains low; AST and Tbili WNL, ALT up to 52 TG: WNL (12/19) Intake / Output: I/O not accurate.  Patient had two incontinent episodes soaking the pad. - UOP 200 mL/24 hrs, NG 1950 mL; stool output 40 mL, drain 45 mL - NGT placed in surgery 12/14 for decompression. IVF: NS 50 ml/hr by CCS for high output ileostomy GI Imaging: 11/4 CT abdomen shows fluid collection, SBO, bladder thickening  11/17 KUB: Dilated small bowel loops are noted concerning for distal SBO 11/19 CT:  SBO, increase in size of gas and fluid abscess formation  11/28 CTAP: mild decrease in gas/gjuid in presacral space, no change in SBO 12/5 CTAP:  severe dilatation of small bowel loops in the abdomen concerning for ileus/obstruction 12/7 GGF enema: shows anastomotic leak, controlled with drain GI Surgeries / Procedures:  10/28: LAR/Diverting ileostomy  11/30: Successful CT-guided presacral pelvic abscess drain placement 12/14: Small bowel resection x 2 with end colostomy, LOA; NGT placed for decompression   Central access: Implanted Port TPN start date: 11/5 >> 11/15. Resumed 11/17>>  Nutritional Goals:  Cyclic TPN:  9562 mL  provides 102 g of protein and 1958 kcals per day  RD Assessment: Estimated Needs Total Energy Estimated Needs: 1950-2150 Total Protein Estimated Needs: 100-110g Total Fluid Estimated Needs: 2L/day  Current Nutrition:  NPO > CLD (11/25)> NPO except ice/sips (12/12) TPN transitioned to 12 hour cyclic (13/08)  Plan:  At 1800: Continue 12 hour cyclic TPN with 1 hour ramp up/ramp down (2040 mL/day) Electrolytes in TPN:  Na 50 mEq/L K 40 mEq/L Ca 59mEq/L Mg 30mEq/L Phos 57mmol/L Cl:Ac 1:2 Add standard MVI and trace elements to TPN No CBG check/SSI NS 50 ml/hr by CCS TPN labs on Mon/Thurs; Bmet Friday.  Gretta Arab PharmD, BCPS Clinical Pharmacist [facility] main pharmacy [phone] 10/09/2021 8:04 AM

## 2021-10-09 NOTE — Progress Notes (Signed)
Patient refused to have I/O cath, MD notified, per MD CC placed. Bladder scan 215 ml @2100 . Patient had two incontinent episodes soaking the pad.

## 2021-10-09 NOTE — Progress Notes (Signed)
8 Days Post-Op LOA, SBR and end colostomy creation  Subjective/Chief Complaint: Complains of abd pain.  Ambulating in the room  Vital signs in last 24 hours: Temp:  [97.5 F (36.4 C)-99.7 F (37.6 C)] 99.7 F (37.6 C) (12/22 0747) Pulse Rate:  [103-122] 116 (12/22 0747) Resp:  [17-20] 18 (12/22 0747) BP: (118-130)/(73-86) 120/82 (12/22 0747) SpO2:  [98 %-100 %] 100 % (12/22 0747) Weight:  [65.3 kg] 65.3 kg (12/22 0500) Last BM Date: 10/07/21  Intake/Output from previous day: 12/21 0701 - 12/22 0700 In: 71.3 [P.O.:10; I.V.:1.3; NG/GT:60] Out: 2235 [Urine:200; Emesis/NG output:1950; Drains:45; Stool:40] Intake/Output this shift: No intake/output data recorded.  General appearance: alert, cooperative, and no distress GI: non-distended, appropriately tender in lower abdomen; ostomy pink with liquid stool, colostomy  viable  Incisions OK, both JP drains with SS drainage  Lab Results:  Recent Labs    10/08/21 2119 10/09/21 0600  WBC 10.8* 11.2*  HGB 7.5* 7.3*  HCT 25.0* 24.8*  PLT 444* 467*    BMET Recent Labs    10/08/21 0606 10/09/21 0600  NA 142 145  K 3.8 4.2  CL 108 112*  CO2 25 27  GLUCOSE 147* 130*  BUN 24* 33*  CREATININE 0.64 0.86  CALCIUM 8.5* 8.2*    PT/INR No results for input(s): LABPROT, INR in the last 72 hours. ABG No results for input(s): PHART, HCO3 in the last 72 hours.  Invalid input(s): PCO2, PO2  Studies/Results: No results found.  Anti-infectives: Anti-infectives (From admission, onward)    Start     Dose/Rate Route Frequency Ordered Stop   09/19/2021 2200  cefoTEtan (CEFOTAN) 1 g in sodium chloride 0.9 % 100 mL IVPB  Status:  Discontinued        1 g 200 mL/hr over 30 Minutes Intravenous Every 12 hours 10/06/2021 1608 10/04/2021 1643   09/27/2021 2200  cefoTEtan (CEFOTAN) 2 g in sodium chloride 0.9 % 100 mL IVPB        2 g 200 mL/hr over 30 Minutes Intravenous  Once 09/21/2021 1644 10/05/2021 2212   10/05/2021 1158  sodium chloride 0.9 %  with cefoTEtan (CEFOTAN) ADS Med       Note to Pharmacy: Georgena Spurling W: cabinet override      10/17/2021 1158 10/02/21 0014   09/17/21 1730  piperacillin-tazobactam (ZOSYN) IVPB 3.375 g        3.375 g 12.5 mL/hr over 240 Minutes Intravenous Every 8 hours 09/17/21 1631 09/22/21 1001   09/08/21 1400  cefTRIAXone (ROCEPHIN) 2 g in sodium chloride 0.9 % 100 mL IVPB       Note to Pharmacy: Pharmacy may adjust dosing strength / duration / interval for maximal efficacy   2 g 200 mL/hr over 30 Minutes Intravenous Every 24 hours 09/08/21 1307 09/14/21 1431   08/26/21 1000  metroNIDAZOLE (FLAGYL) IVPB 500 mg  Status:  Discontinued        500 mg 100 mL/hr over 60 Minutes Intravenous Every 12 hours 08/26/21 0830 08/26/21 0910   08/26/21 1000  piperacillin-tazobactam (ZOSYN) IVPB 3.375 g  Status:  Discontinued        3.375 g 12.5 mL/hr over 240 Minutes Intravenous Every 8 hours 08/26/21 0910 09/01/21 0819   08/25/21 1030  metroNIDAZOLE (FLAGYL) tablet 500 mg  Status:  Discontinued        500 mg Per Tube Every 12 hours 08/25/21 0935 08/26/21 0830   08/25/21 1000  cefTRIAXone (ROCEPHIN) 2 g in sodium chloride 0.9 % 100 mL IVPB  Status:  Discontinued       Note to Pharmacy: Pharmacy may adjust dosing strength / duration / interval for maximal efficacy   2 g 200 mL/hr over 30 Minutes Intravenous Every 24 hours 08/25/21 0823 08/26/21 0910   08/25/21 1000  metroNIDAZOLE (FLAGYL) tablet 500 mg  Status:  Discontinued        500 mg Oral Every 12 hours 08/25/21 0837 08/25/21 0935   07/21/2021 2000  cefoTEtan (CEFOTAN) 2 g in sodium chloride 0.9 % 100 mL IVPB        2 g 200 mL/hr over 30 Minutes Intravenous Every 12 hours 08/05/2021 1647 07/31/2021 2046   08/08/2021 0600  cefoTEtan (CEFOTAN) 2 g in sodium chloride 0.9 % 100 mL IVPB        2 g 200 mL/hr over 30 Minutes Intravenous On call to O.R. 07/21/2021 3267 07/24/2021 0806       Assessment/Plan: s/p Procedure(s): LYSIS OF ADHESION (N/A) SMALL BOWEL RESECTION X 2  WITH END COLOSTOMY (N/A) Ileus: most likely related to pelvic infection and small bowel adhesion in the pelvis, NG out 11/7, AXR with continued ileus on 11/17.  NGT replaced during surgery on 12/14, continue, NG output decreasing, did not tolerate clamping well on 12/21 Severe malnutrition, prolonged ileus: pt not eating well enough to stop TPN, can't tolerate a diet, cont NPO and NG for now  Dehydration: cont MIV   pSBO: due to pelvic adhesions from pelvic abscess.  Pt unable to tolerate NG, IR not able to place, pt unwilling to undergo NG placement in fluoro, IR unable to place decompressive PEG tube.  Pt unable to tolerate a PO diet, taken to OR 12/14 for ex lap.  Several loops of small bowel adherent to completely dehisced anastomosis.  SBR x2 with endo colostomy, pathology c/w carcinomatosis   Anemia: most likely related to chronic illness and malnutrition.  Iron supplements ordered but pt did not tolerate this.  Iron levels low.  IV iron given in early Dec, tachy overnight, will recheck and give blood if needed  anastomotic dehiscence  - JP drainage catheter appears to be in appropriate position, Zosyn d/c'd 11/14.  wbc normal.  - Rpt CT 11/20 shows increased abscess size with drain in correct position.   restarted drain flushes.  No sign of systemic infection.  Discussed with IR on 11/21.  No further targets available for additional drainage.   7 day course of abx repeated.   - CT 11/28 fluid collection more organized but slightly smaller, IR placed TG drain 11/30, Zosyn x5 days -12/8: GGF enema shows anastomotic leak, controlled with drain - 12/14 complete dehischence of anastomosis found on ex lap.  Colostomy created  Physical deconditioning: Ambulate in hall TID, appreciate PT/OT involvement (Pt refusing most interventions)  Narcotic dependence: dilaudid prn for pain, will wean slowly  Urinary retention: foley replaced on Mon 11/1.  foley out again 11/7, failed voiding trial 11/9 with  elevated PVR.   Foley replaced, will need urology f/u as outpatient.   Pt appeared to have a neurogenic bladder during ex lap, foley out, straight cath q8h and start teaching pt to straight cath  Dispo: pt has a temporary housing plan in place at discharge in [place] with his Aunt.  TOC team consulted and working with housing authority to assist with long term housing.      LOS: 55 days    Phillip Brooks 12/45/8099

## 2021-10-10 ENCOUNTER — Other Ambulatory Visit (HOSPITAL_COMMUNITY): Payer: Self-pay

## 2021-10-10 LAB — BASIC METABOLIC PANEL
Anion gap: 8 (ref 5–15)
BUN: 45 mg/dL — ABNORMAL HIGH (ref 8–23)
CO2: 25 mmol/L (ref 22–32)
Calcium: 8.3 mg/dL — ABNORMAL LOW (ref 8.9–10.3)
Chloride: 116 mmol/L — ABNORMAL HIGH (ref 98–111)
Creatinine, Ser: 0.95 mg/dL (ref 0.61–1.24)
GFR, Estimated: >60 mL/min (ref >60)
Glucose, Bld: 144 mg/dL — ABNORMAL HIGH (ref 70–99)
Potassium: 3.9 mmol/L (ref 3.5–5.1)
Sodium: 149 mmol/L — ABNORMAL HIGH (ref 135–145)

## 2021-10-10 MED ORDER — HYDROMORPHONE HCL 1 MG/ML IJ SOLN
0.5000 mg | INTRAMUSCULAR | Status: DC | PRN
  Administered 2021-10-10 – 2021-10-16 (×41): 0.5 mg via INTRAVENOUS
  Filled 2021-10-10 (×42): qty 0.5

## 2021-10-10 MED ORDER — TRAVASOL 10 % IV SOLN
INTRAVENOUS | Status: AC
  Filled 2021-10-10: qty 1020

## 2021-10-10 NOTE — Progress Notes (Signed)
PHARMACY - TOTAL PARENTERAL NUTRITION CONSULT NOTE   Indication: intolerance to enteral feeding  Patient Measurements: Height: 5\' 6"  (167.6 cm) Weight: 60.7 kg (133 lb 13.1 oz) IBW/kg (Calculated) : 63.8 TPN AdjBW (KG): 60.1 Body mass index is 21.6 kg/m.  Assessment:  65 yo M presents on 10/28 for eval of rectal cancer. S/P LAR and diverting ileostomy. Pharmacy consulted to start TPN 11/5. Stopped TPN on 11/15 but had to restart on 11/17 as patient vomited as is unable to tolerate PO.  Glucose / Insulin: No Hx DM. CBGs <481 on 12 hour cyclic TPN.  - D/C SSI/CBGs 12/6, resumed 12/15 s/p dexamethasone x1 dose, stop 12/18 Electrolytes: Na elevated at 149 (reduced in TPN yest.), K slight downtrend but still WNL (s/p reduction in TPN), other lytes WNL including CorrCa 9.8   - Cl remains elevated (Cl:Ac changed to 1:2), CO2 wnl Renal: SCr WNL but increased to 0.95, BUN up to 45 Hepatic: Albumin remains low; AST and Tbili WNL, ALT up to 52 TG: WNL (12/19) Intake / Output: - No documented PO intake - UOP 1550 mL/24 hrs, NG 850 mL; no documented stool output, drain 20 mL - NGT placed in surgery 12/14 for decompression. IVF: NS 50 ml/hr by CCS for high output ileostomy GI Imaging: 11/4 CT abdomen shows fluid collection, SBO, bladder thickening  11/17 KUB: Dilated small bowel loops are noted concerning for distal SBO 11/19 CT:  SBO, increase in size of gas and fluid abscess formation  11/28 CTAP: mild decrease in gas/gjuid in presacral space, no change in SBO 12/5 CTAP:  severe dilatation of small bowel loops in the abdomen concerning for ileus/obstruction 12/7 GGF enema: shows anastomotic leak, controlled with drain GI Surgeries / Procedures:  10/28: LAR/Diverting ileostomy  11/30: Successful CT-guided presacral pelvic abscess drain placement 12/14: Small bowel resection x 2 with end colostomy, LOA; NGT placed for decompression   Central access: Implanted Port TPN start date: 11/5 >>  11/15. Resumed 11/17>>  Nutritional Goals:  Cyclic TPN:  8563 mL provides 102 g of protein and 1958 kcals per day  RD Assessment: Estimated Needs Total Energy Estimated Needs: 1950-2150 Total Protein Estimated Needs: 100-110g Total Fluid Estimated Needs: 2L/day  Current Nutrition:  NPO > CLD (11/25)> NPO except ice/sips (12/12) TPN transitioned to 12 hour cyclic (14/97)  Plan:  At 1800: Continue 12 hour cyclic TPN with 1 hour ramp up/ramp down (2040 mL/day) Electrolytes in TPN:  Na 40 mEq/L K 45 mEq/L Ca 43mEq/L Mg 78mEq/L Phos 15mmol/L Cl:Ac max acetate Add standard MVI and trace elements to TPN No CBG check/SSI NS 50 ml/hr by CCS TPN labs on Mon/Thurs; Bmet Saturday  Dimple Nanas, PharmD 10/10/2021 7:47 AM

## 2021-10-10 NOTE — Progress Notes (Signed)
°   10/10/21 1257  Readmission Prevention Plan - High Risk  Transportation Screening Complete  Home Care Consult (High Risk) Complete  High Risk Social Work Consult for recovery care planning/counseling (includes patient and caregiver) Complete  High Risk Palliative Care Screening Not Applicable  Medication Review Complete

## 2021-10-10 NOTE — Progress Notes (Signed)
9 Days Post-Op LOA, SBR and end colostomy creation  Subjective/Chief Complaint: OOB to chair once yesterday, having trouble with urinary incontinence  Vital signs in last 24 hours: Temp:  [97.3 F (36.3 C)-99 F (37.2 C)] 99 F (37.2 C) (12/23 0557) Pulse Rate:  [84-117] 95 (12/23 0557) Resp:  [18-19] 18 (12/23 0557) BP: (116-127)/(76-83) 127/83 (12/23 0557) SpO2:  [98 %-100 %] 100 % (12/23 0557) Weight:  [60.7 kg] 60.7 kg (12/23 0500) Last BM Date: 10/09/21  Intake/Output from previous day: 12/22 0701 - 12/23 0700 In: 2518.6 [I.V.:2518.6] Out: 2420 [Urine:1550; Emesis/NG output:850; Drains:20] Intake/Output this shift: No intake/output data recorded.  General appearance: alert, cooperative, and no distress GI: non-distended, appropriately tender in lower abdomen; ostomy pink with liquid stool, colostomy  viable  Incisions OK, JP drain with SS drainage  Lab Results:  Recent Labs    10/08/21 2119 10/09/21 0600  WBC 10.8* 11.2*  HGB 7.5* 7.3*  HCT 25.0* 24.8*  PLT 444* 467*    BMET Recent Labs    10/09/21 0600 10/10/21 0505  NA 145 149*  K 4.2 3.9  CL 112* 116*  CO2 27 25  GLUCOSE 130* 144*  BUN 33* 45*  CREATININE 0.86 0.95  CALCIUM 8.2* 8.3*    PT/INR No results for input(s): LABPROT, INR in the last 72 hours. ABG No results for input(s): PHART, HCO3 in the last 72 hours.  Invalid input(s): PCO2, PO2  Studies/Results: No results found.  Anti-infectives: Anti-infectives (From admission, onward)    Start     Dose/Rate Route Frequency Ordered Stop   10/14/2021 2200  cefoTEtan (CEFOTAN) 1 g in sodium chloride 0.9 % 100 mL IVPB  Status:  Discontinued        1 g 200 mL/hr over 30 Minutes Intravenous Every 12 hours 10/10/2021 1608 10/14/2021 1643   10/10/2021 2200  cefoTEtan (CEFOTAN) 2 g in sodium chloride 0.9 % 100 mL IVPB        2 g 200 mL/hr over 30 Minutes Intravenous  Once 09/29/2021 1644 09/19/2021 2212   10/18/2021 1158  sodium chloride 0.9 % with  cefoTEtan (CEFOTAN) ADS Med       Note to Pharmacy: Georgena Spurling W: cabinet override      09/22/2021 1158 10/02/21 0014   09/17/21 1730  piperacillin-tazobactam (ZOSYN) IVPB 3.375 g        3.375 g 12.5 mL/hr over 240 Minutes Intravenous Every 8 hours 09/17/21 1631 09/22/21 1001   09/08/21 1400  cefTRIAXone (ROCEPHIN) 2 g in sodium chloride 0.9 % 100 mL IVPB       Note to Pharmacy: Pharmacy may adjust dosing strength / duration / interval for maximal efficacy   2 g 200 mL/hr over 30 Minutes Intravenous Every 24 hours 09/08/21 1307 09/14/21 1431   08/26/21 1000  metroNIDAZOLE (FLAGYL) IVPB 500 mg  Status:  Discontinued        500 mg 100 mL/hr over 60 Minutes Intravenous Every 12 hours 08/26/21 0830 08/26/21 0910   08/26/21 1000  piperacillin-tazobactam (ZOSYN) IVPB 3.375 g  Status:  Discontinued        3.375 g 12.5 mL/hr over 240 Minutes Intravenous Every 8 hours 08/26/21 0910 09/01/21 0819   08/25/21 1030  metroNIDAZOLE (FLAGYL) tablet 500 mg  Status:  Discontinued        500 mg Per Tube Every 12 hours 08/25/21 0935 08/26/21 0830   08/25/21 1000  cefTRIAXone (ROCEPHIN) 2 g in sodium chloride 0.9 % 100 mL IVPB  Status:  Discontinued       Note to Pharmacy: Pharmacy may adjust dosing strength / duration / interval for maximal efficacy   2 g 200 mL/hr over 30 Minutes Intravenous Every 24 hours 08/25/21 0823 08/26/21 0910   08/25/21 1000  metroNIDAZOLE (FLAGYL) tablet 500 mg  Status:  Discontinued        500 mg Oral Every 12 hours 08/25/21 0837 08/25/21 0935   08/07/2021 2000  cefoTEtan (CEFOTAN) 2 g in sodium chloride 0.9 % 100 mL IVPB        2 g 200 mL/hr over 30 Minutes Intravenous Every 12 hours 08/06/2021 1647 08/08/2021 2046   07/30/2021 0600  cefoTEtan (CEFOTAN) 2 g in sodium chloride 0.9 % 100 mL IVPB        2 g 200 mL/hr over 30 Minutes Intravenous On call to O.R. 08/16/2021 [id number] 08/13/2021 0806       Assessment/Plan: s/p Procedure(s): LYSIS OF ADHESION (N/A) SMALL BOWEL RESECTION X 2 WITH  END COLOSTOMY (N/A) Ileus: most likely related to pelvic infection and small bowel adhesion in the pelvis, NG out 11/7, AXR with continued ileus on 11/17.  NGT replaced during surgery on 12/14, continue, NG output decreasing, did not tolerate clamping well on 12/21 Severe malnutrition, prolonged ileus: pt not eating well enough to stop TPN, can't tolerate a diet, cont NPO and NG for now  Dehydration: cont MIV   pSBO: due to pelvic adhesions from pelvic abscess.  Pt unable to tolerate NG, IR not able to place, pt unwilling to undergo NG placement in fluoro, IR unable to place decompressive PEG tube.  Pt unable to tolerate a PO diet, taken to OR 12/14 for ex lap.  Several loops of small bowel adherent to completely dehisced anastomosis.  SBR x2 with endo colostomy, pathology c/w carcinomatosis   Anemia: most likely related to chronic illness and malnutrition.  Iron supplements ordered but pt did not tolerate this.  Iron levels low.  IV iron given in early Dec, tachy overnight, will recheck and give blood if needed  anastomotic dehiscence  - JP drainage catheter appears to be in appropriate position, Zosyn d/c'd 11/14.  wbc normal.  - Rpt CT 11/20 shows increased abscess size with drain in correct position.   restarted drain flushes.  No sign of systemic infection.  Discussed with IR on 11/21.  No further targets available for additional drainage.   7 day course of abx repeated.   - CT 11/28 fluid collection more organized but slightly smaller, IR placed TG drain 11/30, Zosyn x5 days -12/8: GGF enema shows anastomotic leak, controlled with drain - 12/14 complete dehischence of anastomosis found on ex lap.  Colostomy created  Physical deconditioning: Ambulate in hall TID, appreciate PT/OT involvement (Pt refusing most interventions)  Narcotic dependence: dilaudid prn for pain, will wean slowly  Urinary retention: foley replaced on Mon 11/1.  foley out again 11/7, failed voiding trial 11/9 with  elevated PVR.   Foley replaced, will need urology f/u as outpatient.   Pt appeared to have a neurogenic bladder during ex lap, foley out, straight cath q8h and start teaching pt to straight cath  Dispo: pt has a temporary housing plan in place at discharge in [place] with his Aunt.  TOC team consulted and working with housing authority to assist with long term housing.      LOS: 56 days    Phillip Brooks 42/87/[id number]

## 2021-10-10 NOTE — Progress Notes (Addendum)
Mobility Specialist - Progress Note    10/10/21 1140  Oxygen Therapy  O2 Device Room Air  Mobility  Activity Ambulated in hall;Ambulated in room  Level of Assistance Standby assist, set-up cues, supervision of patient - no hands on  Assistive Device Front wheel walker  Distance Ambulated (ft) 120 ft  Mobility Ambulated with assistance in hallway;Ambulated with assistance in room  Mobility Response Tolerated well  Mobility performed by Mobility specialist  $Mobility charge 1 Mobility   Pt was reluctant to ambulate upon arrival, but with persistent encourage pt agreed. Pt used RW to ambulate 10 ft in room prior to ambulating 120 ft in hallway. Pt c/o of pain, but continued to walk and returned to room to sit up in recliner. Pt left in chair, call bell at side, and RN in room to reconnect NGT.   [place] Specialist [facility] Phone: [phone] 10/10/21, 11:42 AM

## 2021-10-11 ENCOUNTER — Inpatient Hospital Stay (HOSPITAL_COMMUNITY): Payer: Medicare HMO

## 2021-10-11 ENCOUNTER — Encounter (HOSPITAL_COMMUNITY): Payer: Self-pay | Admitting: General Surgery

## 2021-10-11 LAB — CBC
HCT: 25.1 % — ABNORMAL LOW (ref 39.0–52.0)
Hemoglobin: 7.3 g/dL — ABNORMAL LOW (ref 13.0–17.0)
MCH: 25.3 pg — ABNORMAL LOW (ref 26.0–34.0)
MCHC: 29.1 g/dL — ABNORMAL LOW (ref 30.0–36.0)
MCV: 86.9 fL (ref 80.0–100.0)
Platelets: 539 10*3/uL — ABNORMAL HIGH (ref 150–400)
RBC: 2.89 MIL/uL — ABNORMAL LOW (ref 4.22–5.81)
RDW: 17.1 % — ABNORMAL HIGH (ref 11.5–15.5)
WBC: 14.9 10*3/uL — ABNORMAL HIGH (ref 4.0–10.5)
nRBC: 0 % (ref 0.0–0.2)

## 2021-10-11 LAB — BASIC METABOLIC PANEL
Anion gap: 8 (ref 5–15)
BUN: 56 mg/dL — ABNORMAL HIGH (ref 8–23)
CO2: 29 mmol/L (ref 22–32)
Calcium: 8.2 mg/dL — ABNORMAL LOW (ref 8.9–10.3)
Chloride: 115 mmol/L — ABNORMAL HIGH (ref 98–111)
Creatinine, Ser: 1.26 mg/dL — ABNORMAL HIGH (ref 0.61–1.24)
GFR, Estimated: >60 mL/min (ref >60)
Glucose, Bld: 125 mg/dL — ABNORMAL HIGH (ref 70–99)
Potassium: 4 mmol/L (ref 3.5–5.1)
Sodium: 152 mmol/L — ABNORMAL HIGH (ref 135–145)

## 2021-10-11 MED ORDER — IOHEXOL 9 MG/ML PO SOLN
ORAL | Status: AC
  Administered 2021-10-11: 10:00:00 500 mL
  Filled 2021-10-11: qty 1000

## 2021-10-11 MED ORDER — TRAVASOL 10 % IV SOLN
INTRAVENOUS | Status: AC
  Filled 2021-10-11: qty 1020

## 2021-10-11 MED ORDER — SODIUM CHLORIDE (PF) 0.9 % IJ SOLN
INTRAMUSCULAR | Status: AC
  Filled 2021-10-11: qty 50

## 2021-10-11 MED ORDER — IOHEXOL 9 MG/ML PO SOLN
500.0000 mL | ORAL | Status: AC
  Administered 2021-10-11: 10:00:00 500 mL via ORAL

## 2021-10-11 MED ORDER — IOHEXOL 350 MG/ML SOLN
80.0000 mL | Freq: Once | INTRAVENOUS | Status: AC | PRN
  Administered 2021-10-11: 15:00:00 80 mL via INTRAVENOUS

## 2021-10-11 NOTE — Progress Notes (Signed)
10 Days Post-Op LOA, SBR and end colostomy creation  Subjective/Chief Complaint: Ambulated in hall yesterday  Vital signs in last 24 hours: Temp:  [97.8 F (36.6 C)-98.4 F (36.9 C)] 98.4 F (36.9 C) (12/24 0513) Pulse Rate:  [108-118] 110 (12/24 0513) Resp:  [16-20] 19 (12/24 0513) BP: (116-130)/(81-85) 130/81 (12/24 0513) SpO2:  [95 %-100 %] 95 % (12/24 0513) Weight:  [60.5 kg] 60.5 kg (12/24 0500) Last BM Date: 10/09/21  Intake/Output from previous day: 12/23 0701 - 12/24 0700 In: 2382.3 [I.V.:2382.3] Out: 4485 [Urine:1750; Emesis/NG output:2700; Drains:35] Intake/Output this shift: No intake/output data recorded.  General appearance: alert, cooperative, and no distress GI: non-distended, appropriately tender in lower abdomen; ostomy pink with liquid stool, colostomy  viable  Incisions OK, JP drain with SS drainage  Lab Results:  Recent Labs    10/09/21 0600 10/11/21 0706  WBC 11.2* 14.9*  HGB 7.3* 7.3*  HCT 24.8* 25.1*  PLT 467* 539*    BMET Recent Labs    10/10/21 0505 10/11/21 0706  NA 149* 152*  K 3.9 4.0  CL 116* 115*  CO2 25 29  GLUCOSE 144* 125*  BUN 45* 56*  CREATININE 0.95 1.26*  CALCIUM 8.3* 8.2*    PT/INR No results for input(s): LABPROT, INR in the last 72 hours. ABG No results for input(s): PHART, HCO3 in the last 72 hours.  Invalid input(s): PCO2, PO2  Studies/Results: No results found.  Anti-infectives: Anti-infectives (From admission, onward)    Start     Dose/Rate Route Frequency Ordered Stop   09/19/2021 2200  cefoTEtan (CEFOTAN) 1 g in sodium chloride 0.9 % 100 mL IVPB  Status:  Discontinued        1 g 200 mL/hr over 30 Minutes Intravenous Every 12 hours 10/14/2021 1608 09/24/2021 1643   09/18/2021 2200  cefoTEtan (CEFOTAN) 2 g in sodium chloride 0.9 % 100 mL IVPB        2 g 200 mL/hr over 30 Minutes Intravenous  Once 10/11/2021 1644 09/27/2021 2212   10/02/2021 1158  sodium chloride 0.9 % with cefoTEtan (CEFOTAN) ADS Med       Note  to Pharmacy: Georgena Spurling W: cabinet override      10/16/2021 1158 10/02/21 0014   09/17/21 1730  piperacillin-tazobactam (ZOSYN) IVPB 3.375 g        3.375 g 12.5 mL/hr over 240 Minutes Intravenous Every 8 hours 09/17/21 1631 09/22/21 1001   09/08/21 1400  cefTRIAXone (ROCEPHIN) 2 g in sodium chloride 0.9 % 100 mL IVPB       Note to Pharmacy: Pharmacy may adjust dosing strength / duration / interval for maximal efficacy   2 g 200 mL/hr over 30 Minutes Intravenous Every 24 hours 09/08/21 1307 09/14/21 1431   08/26/21 1000  metroNIDAZOLE (FLAGYL) IVPB 500 mg  Status:  Discontinued        500 mg 100 mL/hr over 60 Minutes Intravenous Every 12 hours 08/26/21 0830 08/26/21 0910   08/26/21 1000  piperacillin-tazobactam (ZOSYN) IVPB 3.375 g  Status:  Discontinued        3.375 g 12.5 mL/hr over 240 Minutes Intravenous Every 8 hours 08/26/21 0910 09/01/21 0819   08/25/21 1030  metroNIDAZOLE (FLAGYL) tablet 500 mg  Status:  Discontinued        500 mg Per Tube Every 12 hours 08/25/21 0935 08/26/21 0830   08/25/21 1000  cefTRIAXone (ROCEPHIN) 2 g in sodium chloride 0.9 % 100 mL IVPB  Status:  Discontinued  Note to Pharmacy: Pharmacy may adjust dosing strength / duration / interval for maximal efficacy   2 g 200 mL/hr over 30 Minutes Intravenous Every 24 hours 08/25/21 0823 08/26/21 0910   08/25/21 1000  metroNIDAZOLE (FLAGYL) tablet 500 mg  Status:  Discontinued        500 mg Oral Every 12 hours 08/25/21 0837 08/25/21 0935   08/10/2021 2000  cefoTEtan (CEFOTAN) 2 g in sodium chloride 0.9 % 100 mL IVPB        2 g 200 mL/hr over 30 Minutes Intravenous Every 12 hours 08/03/2021 1647 08/18/2021 2046   08/14/2021 0600  cefoTEtan (CEFOTAN) 2 g in sodium chloride 0.9 % 100 mL IVPB        2 g 200 mL/hr over 30 Minutes Intravenous On call to O.R. 08/11/2021 [id number] 07/30/2021 0806       Assessment/Plan: s/p Procedure(s): LYSIS OF ADHESION (N/A) SMALL BOWEL RESECTION X 2 WITH END COLOSTOMY (N/A)  Severe  malnutrition, prolonged ileus: pt not eating well enough to stop TPN, can't tolerate a diet, cont NPO and NG for now  Dehydration: cont MIV   pSBO: due to pelvic adhesions from pelvic abscess.  Pt unable to tolerate NG, IR not able to place, pt unwilling to undergo NG placement in fluoro, IR unable to place decompressive PEG tube.  Pt unable to tolerate a PO diet, taken to OR 12/14 for ex lap.  Several loops of small bowel adherent to completely dehisced anastomosis.  SBR x2 with endo colostomy, pathology c/w carcinomatosis.  Ng output remains high.    Anemia: most likely related to chronic illness and malnutrition.  Iron supplements ordered but pt did not tolerate this.  Iron levels low.  IV iron given in early Dec, tachy overnight, will recheck and give blood if needed  anastomotic dehiscence  - JP drainage catheter appears to be in appropriate position, Zosyn d/c'd 11/14.  wbc normal.  - Rpt CT 11/20 shows increased abscess size with drain in correct position.   restarted drain flushes.  No sign of systemic infection.  Discussed with IR on 11/21.  No further targets available for additional drainage.   7 day course of abx repeated.   - CT 11/28 fluid collection more organized but slightly smaller, IR placed TG drain 11/30, Zosyn x5 days -12/8: GGF enema shows anastomotic leak, controlled with drain - 12/14 complete dehischence of anastomosis found on ex lap.  Colostomy created - 12/22 Abd JP removed  ID: wbc elevated off abx.  Will check CT scan due to continued high output NG  Physical deconditioning: Ambulate in hall TID, appreciate PT/OT involvement (Pt refusing most interventions)  Narcotic dependence: dilaudid prn for pain, will wean slowly  Urinary retention: foley replaced on Mon 11/1.  foley out again 11/7, failed voiding trial 11/9 with elevated PVR.   Foley replaced, will need urology f/u as outpatient.   Pt appeared to have a neurogenic bladder during ex lap, foley out, straight  cath q8h and start teaching pt to straight cath  Dispo: pt has a temporary housing plan in place at discharge in [place] with his Aunt.  TOC team consulted and working with housing authority to assist with long term housing.      LOS: 57 days    Phillip Brooks [id number]/35/7017

## 2021-10-11 NOTE — Progress Notes (Addendum)
PHARMACY - TOTAL PARENTERAL NUTRITION CONSULT NOTE   Indication: intolerance to enteral feeding  Patient Measurements: Height: 5\' 6"  (167.6 cm) Weight: 60.5 kg (133 lb 6.1 oz) IBW/kg (Calculated) : 63.8 TPN AdjBW (KG): 60.1 Body mass index is 21.53 kg/m.  Assessment:  65 yo M presents on 10/28 for eval of rectal cancer. S/P LAR and diverting ileostomy. Pharmacy consulted to start TPN 11/5. Stopped TPN on 11/15 but had to restart on 11/17 as patient vomited as is unable to tolerate PO.  12/23  WBC elevated off abx.  f/u CT scan due to continued high output NG.    Glucose / Insulin: No Hx DM. CBGs <638 on 12 hour cyclic TPN.  - D/C SSI/CBGs 12/6, resumed 12/15 s/p dexamethasone x1 dose, stop 12/18 Electrolytes: Na elevated at 152 (reduced in TPN yest), others WNL - Cl elevated but improved, CO2 wnl Renal: SCr significantly increased (0.6 > 0.95 > 1.26), BUN up to 56 Hepatic: Albumin remains low; AST and Tbili WNL, ALT up to 52 TG: WNL (12/19) Intake / Output:  net I/O -2.1 L.  No documented PO intake - UOP 1750 mL/24 hrs, NG increased to 2700 mL,drain 35 mL - No documented colostomy stool output IVF: NS 50 ml/hr by CCS for high output ileostomy GI Imaging: 11/4 CT abdomen shows fluid collection, SBO, bladder thickening  11/17 KUB: Dilated small bowel loops are noted concerning for distal SBO 11/19 CT:  SBO, increase in size of gas and fluid abscess formation  11/28 CTAP: mild decrease in gas/gjuid in presacral space, no change in SBO 12/5 CTAP:  severe dilatation of small bowel loops in the abdomen concerning for ileus/obstruction 12/7 GGF enema: shows anastomotic leak, controlled with drain GI Surgeries / Procedures:  10/28: LAR/Diverting ileostomy  11/30: Successful CT-guided presacral pelvic abscess drain placement 12/14: Small bowel resection x 2 with end colostomy, LOA; NGT placed for decompression   Central access: Implanted Port TPN start date: 11/5 >> 11/15. Resumed  11/17>>  Nutritional Goals:  Cyclic TPN:  4536 mL provides 102 g of protein and 1958 kcals per day  RD Assessment: Estimated Needs Total Energy Estimated Needs: 1950-2150 Total Protein Estimated Needs: 100-110g Total Fluid Estimated Needs: 2L/day  Current Nutrition:  NPO > CLD (11/25)> NPO except ice/sips (12/12) TPN transitioned to 12 hour cyclic (46/80)  Plan:  At 1800: Continue 12 hour cyclic TPN with 1 hour ramp up/ramp down (2040 mL/day) Electrolytes in TPN: balance AKI with high NG/urine output. Na 0 mEq/L K 35 mEq/L Ca 93mEq/L Mg 48mEq/L Phos 101mmol/L Cl:Ac max acetate Add standard MVI and trace elements to TPN No CBG check/SSI NS 50 ml/hr by CCS TPN labs on Mon/Thurs; Bmet, mag and phos with AM labs.     Gretta Arab PharmD, BCPS Clinical Pharmacist [facility] main pharmacy [phone] 10/11/2021 10:18 AM

## 2021-10-12 LAB — BASIC METABOLIC PANEL
Anion gap: 10 (ref 5–15)
BUN: 80 mg/dL — ABNORMAL HIGH (ref 8–23)
CO2: 28 mmol/L (ref 22–32)
Calcium: 8.1 mg/dL — ABNORMAL LOW (ref 8.9–10.3)
Chloride: 113 mmol/L — ABNORMAL HIGH (ref 98–111)
Creatinine, Ser: 2.08 mg/dL — ABNORMAL HIGH (ref 0.61–1.24)
GFR, Estimated: 34 mL/min — ABNORMAL LOW (ref >60)
Glucose, Bld: 139 mg/dL — ABNORMAL HIGH (ref 70–99)
Potassium: 4 mmol/L (ref 3.5–5.1)
Sodium: 151 mmol/L — ABNORMAL HIGH (ref 135–145)

## 2021-10-12 LAB — MAGNESIUM: Magnesium: 2.5 mg/dL — ABNORMAL HIGH (ref 1.7–2.4)

## 2021-10-12 LAB — PHOSPHORUS: Phosphorus: 5.5 mg/dL — ABNORMAL HIGH (ref 2.5–4.6)

## 2021-10-12 MED ORDER — ENOXAPARIN SODIUM 30 MG/0.3ML IJ SOSY
30.0000 mg | PREFILLED_SYRINGE | Freq: Every day | INTRAMUSCULAR | Status: DC
  Administered 2021-10-12 – 2021-10-14 (×3): 30 mg via SUBCUTANEOUS
  Filled 2021-10-12 (×3): qty 0.3

## 2021-10-12 MED ORDER — TRAVASOL 10 % IV SOLN
INTRAVENOUS | Status: AC
  Filled 2021-10-12: qty 1020

## 2021-10-12 MED ORDER — PIPERACILLIN-TAZOBACTAM 3.375 G IVPB
3.3750 g | Freq: Three times a day (TID) | INTRAVENOUS | Status: DC
  Administered 2021-10-12 – 2021-10-21 (×27): 3.375 g via INTRAVENOUS
  Filled 2021-10-12 (×27): qty 50

## 2021-10-12 NOTE — Progress Notes (Signed)
PHARMACY - TOTAL PARENTERAL NUTRITION CONSULT NOTE   Indication: intolerance to enteral feeding  Patient Measurements: Height: 5\' 6"  (167.6 cm) Weight: 60.5 kg (133 lb 6.1 oz) IBW/kg (Calculated) : 63.8 TPN AdjBW (KG): 60.1 Body mass index is 21.53 kg/m.  Assessment:  65 yo M presents on 10/28 for eval of rectal cancer. S/P LAR and diverting ileostomy. Pharmacy consulted to start TPN 11/5. Stopped TPN on 11/15 but had to restart on 11/17 as patient vomited as is unable to tolerate PO.  Glucose / Insulin: No Hx DM. CBGs <338 on 12 hour cyclic TPN.  - D/C SSI/CBGs 12/6, resumed 12/15 s/p dexamethasone x1 dose, stop 12/18 Electrolytes: Na elevated at 151 (removed from TPN yest), Cl elevated, Mag and Phos elevated.  K WNL, CorrCa 9.6 WNL Renal: Worsening AKI:  SCr increased to 2.08 (baseline 0.6). BUN up to 80 Hepatic: Albumin remains low; AST and Tbili WNL, ALT up to 52 TG: WNL (12/19) Intake / Output:  net I/O -2.1 L - UOP 1850 mL/24 hrs, NG 1750 mL,drain 30 mL, stool 350 mL IVF: NS 50 ml/hr by CCS for high output ileostomy GI Imaging: 11/4 CT abdomen shows fluid collection, SBO, bladder thickening  11/17 KUB: Dilated small bowel loops are noted concerning for distal SBO 11/19 CT:  SBO, increase in size of gas and fluid abscess formation  11/28 CTAP: mild decrease in gas/gjuid in presacral space, no change in SBO 12/5 CTAP:  severe dilatation of small bowel loops in the abdomen concerning for ileus/obstruction 12/7 GGF enema: shows anastomotic leak, controlled with drain 12/24 CT: continued pelvic abscess and new RLQ fluid collection GI Surgeries / Procedures:  10/28: LAR/Diverting ileostomy  11/30: Successful CT-guided presacral pelvic abscess drain placement 12/14: Small bowel resection x 2 with end colostomy, LOA; NGT placed for decompression   Central access: Implanted Port TPN start date: 11/5 >> 11/15. Resumed 11/17>>  Nutritional Goals:  Cyclic TPN:  3291 mL provides 102  g of protein and 1958 kcals per day  RD Assessment: Estimated Needs Total Energy Estimated Needs: 1950-2150 Total Protein Estimated Needs: 100-110g Total Fluid Estimated Needs: 2L/day  Current Nutrition:  NPO > CLD (11/25)> NPO except ice/sips (12/12) TPN transitioned to 12 hour cyclic (91/66)  Plan:  At 1800: Continue 12 hour cyclic TPN with 1 hour ramp up/ramp down (2040 mL/day) Electrolytes in TPN removed d/t AKI, replace PRN Na 0 mEq/L K 0 mEq/L Ca 27mEq/L Mg 35mEq/L Phos 61mmol/L Cl:Ac max acetate Add standard MVI and trace elements to TPN No CBG check/SSI IVF per MD  TPN labs on Mon/Thurs   Gretta Arab PharmD, BCPS Clinical Pharmacist [facility] main pharmacy [phone] 10/12/2021 8:55 AM

## 2021-10-12 NOTE — Progress Notes (Signed)
11 Days Post-Op LOA, SBR and end colostomy creation  Subjective/Chief Complaint: No new issues, CT reviewed with pt  Vital signs in last 24 hours: Temp:  [97.3 F (36.3 C)-100.2 F (37.9 C)] 98.6 F (37 C) (12/25 0653) Pulse Rate:  [113-119] 119 (12/25 0653) Resp:  [16-20] 18 (12/25 0653) BP: (111-129)/(73-90) 112/83 (12/25 0653) SpO2:  [97 %-100 %] 98 % (12/25 0653) Weight:  [60.5 kg] 60.5 kg (12/25 0500) Last BM Date: 10/11/21  Intake/Output from previous day: 12/24 0701 - 12/25 0700 In: 1881.6 [P.O.:180; I.V.:1701.6] Out: 3980 [Urine:1850; Emesis/NG output:1750; Drains:30; Stool:350] Intake/Output this shift: No intake/output data recorded.  General appearance: alert, cooperative, and no distress GI: non-distended, appropriately tender in lower abdomen; ostomy pink with liquid stool, colostomy  viable  Incisions OK, JP drain with SS drainage  Lab Results:  Recent Labs    10/11/21 0706  WBC 14.9*  HGB 7.3*  HCT 25.1*  PLT 539*    BMET Recent Labs    10/11/21 0706 10/12/21 0605  NA 152* 151*  K 4.0 4.0  CL 115* 113*  CO2 29 28  GLUCOSE 125* 139*  BUN 56* 80*  CREATININE 1.26* 2.08*  CALCIUM 8.2* 8.1*    PT/INR No results for input(s): LABPROT, INR in the last 72 hours. ABG No results for input(s): PHART, HCO3 in the last 72 hours.  Invalid input(s): PCO2, PO2  Studies/Results: CT ABDOMEN PELVIS W CONTRAST  Result Date: 10/11/2021 CLINICAL DATA:  Intra-abdominal abscess. History of rectal cancer. Status post low anterior resection with diverting ileostomy. EXAM: CT ABDOMEN AND PELVIS WITH CONTRAST TECHNIQUE: Multidetector CT imaging of the abdomen and pelvis was performed using the standard protocol following bolus administration of intravenous contrast. CONTRAST:  38mL OMNIPAQUE IOHEXOL 350 MG/ML SOLN COMPARISON:  09/22/2021 FINDINGS: Lower chest: Dependent atelectasis bilaterally is mildly progressive in the interval with new small left pleural  effusion. Hepatobiliary: 10 mm low-density lesion in the inferior right liver (28/2) not definitely seen previously. A second medial right liver lesion posterior to the IVC measuring 13 mm on 24/2 is new in the interval. A third subtle 16 mm lesion in the lateral segment left liver noted on image 30/2. Finally, 14 mm ill-defined low-density lesion inferior right liver on 34/2. Previous exam was without intravenous contrast. Comparing to a postcontrast CT of 09/15/2021, these lesions also appear new since that exam. Gallbladder is nondistended. No intrahepatic or extrahepatic biliary dilation. Pancreas: No focal mass lesion. No dilatation of the main duct. No intraparenchymal cyst. No peripancreatic edema. Spleen: No splenomegaly. No focal mass lesion. Adrenals/Urinary Tract: No adrenal nodule or mass. Mild right hydronephrosis is associated with minimal right hydroureter. Left kidney unremarkable. No left hydroureter. Gas is visible in the urinary bladder, presumably secondary to recent instrumentation. Stomach/Bowel: Stomach is nondistended with NG tube tip in the proximal stomach. Duodenum is normally positioned as is the ligament of Treitz. Small bowel loops of the abdomen are fluid-filled and distended up to 4.8 cm diameter. Generally, the small bowel dilatation appears decreased since the abdomen only CT of 09/22/2021 and is similar to minimally decreased comparing to 09/15/2021. Patient has undergone lysis of adhesion with small-bowel resection since the prior 2 studies (postop date 09/30/2021) with small bowel anastomosis identified left paramidline abdomen at the level of the umbilicus and right lower quadrant. A discrete transition zone for the small-bowel obstruction cannot be identified on the current study. Small bowel extending into the right lower quadrant ileostomy is decompressed. Residual contrast material noted  in a nondilated colon with left lower quadrant sigmoid end colostomy evident.  Percutaneous drainage catheter is coiled in a presacral abscess measuring 5.5 x 4.3 cm on image 68/2 today. Vascular/Lymphatic: No abdominal aortic aneurysm. No abdominal lymphadenopathy. No pelvic sidewall lymphadenopathy. Reproductive: The prostate gland and seminal vesicles are unremarkable. Other: On today's exam, there is new gas and debris in the central pelvis, in the region of the peritoneal reflection (image 61/2). There is also a loculated collection of fluid and gas in the anterior right lower quadrant just deep to the rectus sheath measuring 9.4 x 2.2 cm on image 53/2. 1.9 cm collection of gas and fluid is identified in the midline rectus sheath of the pelvis (65/2). Musculoskeletal: No worrisome lytic or sclerotic osseous abnormality. IMPRESSION: 1. Interval lysis of small-bowel adhesion with small-bowel resection and 2 areas of small bowel anastomosis identified low abdomen/pelvis. Small bowel remains dilated on today's study, measuring up to 4.8 cm diameter, suggesting obstruction. Discrete transition zone is not identified although given the presence of increasing extraluminal gas/debris in the central pelvis and an apparent loculated rim enhancing abscess in the anterior right pelvis, inflammatory ileus would be a consideration. Obstruction secondary to adhesion also a possibility. 2. Interval development of increasing gas and debris in the central pelvis, cranial to the presacral abscess in the region of the peritoneal reflection. 3. New loculated collection of fluid and gas in the anterior right lower quadrant just deep to the rectus sheath measuring 9.4 x 2.2 cm and compatible with abscess. 4. 1.9 cm collection of gas and fluid is identified in the midline rectus sheath of the pelvis. Residual gas in the rectus sheath would not be expected more than 10 days after surgery and a small rectus sheath abscess is not excluded. 5. Interval development of multiple low-density lesions in the liver. Given  the presence of infection and relatively rapid appearance, these could represent tiny abscesses although new metastatic disease to the liver is also a consideration. 6. Mild right hydronephrosis with minimal right hydroureter. 7. Gas in the urinary bladder, presumably secondary to recent instrumentation. 8. New small left pleural effusion. Electronically Signed   By: Misty Stanley M.D.   On: 10/11/2021 15:38    Anti-infectives: Anti-infectives (From admission, onward)    Start     Dose/Rate Route Frequency Ordered Stop   10/12/21 1000  piperacillin-tazobactam (ZOSYN) IVPB 3.375 g        3.375 g 12.5 mL/hr over 240 Minutes Intravenous Every 8 hours 10/12/21 0727     09/20/2021 2200  cefoTEtan (CEFOTAN) 1 g in sodium chloride 0.9 % 100 mL IVPB  Status:  Discontinued        1 g 200 mL/hr over 30 Minutes Intravenous Every 12 hours 10/10/2021 1608 10/03/2021 1643   09/27/2021 2200  cefoTEtan (CEFOTAN) 2 g in sodium chloride 0.9 % 100 mL IVPB        2 g 200 mL/hr over 30 Minutes Intravenous  Once 10/17/2021 1644 10/08/2021 2212   09/30/2021 1158  sodium chloride 0.9 % with cefoTEtan (CEFOTAN) ADS Med       Note to Pharmacy: Georgena Spurling W: cabinet override      09/18/2021 1158 10/02/21 0014   09/17/21 1730  piperacillin-tazobactam (ZOSYN) IVPB 3.375 g        3.375 g 12.5 mL/hr over 240 Minutes Intravenous Every 8 hours 09/17/21 1631 09/22/21 1001   09/08/21 1400  cefTRIAXone (ROCEPHIN) 2 g in sodium chloride 0.9 % 100 mL IVPB  Note to Pharmacy: Pharmacy may adjust dosing strength / duration / interval for maximal efficacy   2 g 200 mL/hr over 30 Minutes Intravenous Every 24 hours 09/08/21 1307 09/14/21 1431   08/26/21 1000  metroNIDAZOLE (FLAGYL) IVPB 500 mg  Status:  Discontinued        500 mg 100 mL/hr over 60 Minutes Intravenous Every 12 hours 08/26/21 0830 08/26/21 0910   08/26/21 1000  piperacillin-tazobactam (ZOSYN) IVPB 3.375 g  Status:  Discontinued        3.375 g 12.5 mL/hr over 240 Minutes  Intravenous Every 8 hours 08/26/21 0910 09/01/21 0819   08/25/21 1030  metroNIDAZOLE (FLAGYL) tablet 500 mg  Status:  Discontinued        500 mg Per Tube Every 12 hours 08/25/21 0935 08/26/21 0830   08/25/21 1000  cefTRIAXone (ROCEPHIN) 2 g in sodium chloride 0.9 % 100 mL IVPB  Status:  Discontinued       Note to Pharmacy: Pharmacy may adjust dosing strength / duration / interval for maximal efficacy   2 g 200 mL/hr over 30 Minutes Intravenous Every 24 hours 08/25/21 0823 08/26/21 0910   08/25/21 1000  metroNIDAZOLE (FLAGYL) tablet 500 mg  Status:  Discontinued        500 mg Oral Every 12 hours 08/25/21 0837 08/25/21 0935   08/04/2021 2000  cefoTEtan (CEFOTAN) 2 g in sodium chloride 0.9 % 100 mL IVPB        2 g 200 mL/hr over 30 Minutes Intravenous Every 12 hours 08/13/2021 1647 08/07/2021 2046   08/16/2021 0600  cefoTEtan (CEFOTAN) 2 g in sodium chloride 0.9 % 100 mL IVPB        2 g 200 mL/hr over 30 Minutes Intravenous On call to O.R. 08/07/2021 4742 08/07/2021 0806       Assessment/Plan: s/p Procedure(s): LYSIS OF ADHESION (N/A) SMALL BOWEL RESECTION X 2 WITH END COLOSTOMY (N/A)  Severe malnutrition, prolonged ileus: pt not eating well enough to stop TPN, can't tolerate a diet, cont NPO and NG for now  Dehydration: cont MIV   pSBO: due to pelvic adhesions from pelvic abscess.  Pt unable to tolerate NG, IR not able to place, pt unwilling to undergo NG placement in fluoro, IR unable to place decompressive PEG tube.  Pt unable to tolerate a PO diet, taken to OR 12/14 for ex lap.  Several loops of small bowel adherent to completely dehisced anastomosis.  SBR x2 with endo colostomy, pathology c/w carcinomatosis.  Ng output remains high.    Anemia: most likely related to chronic illness and malnutrition.  Iron supplements ordered but pt did not tolerate this.  Iron levels low.  IV iron given in early Dec, tachy overnight, will recheck and give blood as needed  anastomotic dehiscence  - JP drainage  catheter appears to be in appropriate position, Zosyn d/c'd 11/14.  wbc normal.  - Rpt CT 11/20 shows increased abscess size with drain in correct position.   restarted drain flushes.  No sign of systemic infection.  Discussed with IR on 11/21.  No further targets available for additional drainage.   7 day course of abx repeated.   - CT 11/28 fluid collection more organized but slightly smaller, IR placed TG drain 11/30, Zosyn x5 days -12/8: GGF enema shows anastomotic leak, controlled with drain - 12/14 complete dehischence of anastomosis found on ex lap.  Colostomy created - 12/22 Abd JP removed CT 12/24 shows continued pelvic abscess and new RLQ fluid collection.  Will restart Zosyn and have IR eval for drainage tomorrow  ID: wbc elevated off abx.  Zosyn per pharmacy  Physical deconditioning: Ambulate in hall TID, appreciate PT/OT involvement (Pt refusing most interventions)  Narcotic dependence: dilaudid prn for pain, will wean slowly  Urinary retention: foley replaced on Mon 11/1.  foley out again 11/7, failed voiding trial 11/9 with elevated PVR.   Foley replaced, will need urology f/u as outpatient.   Pt appeared to have a neurogenic bladder during ex lap, foley out, straight cath q8h and start teaching pt to straight cath  Elevated Cr: good UOP, possibly due to obstruction since elevated after removing foley.  Will replace foley and recheck.  If Cr normalizes with this, then he will need urology consult to determine the best way to proceed with his obstructive pathology.    Pt with forming carcinomatous on recent exlap and new liver lesions concerning for mets on most recent CT.  Dr Benay Spice and pt aware.  Nothing further to do until recovered from GI standpoint  Dispo: pt has a temporary housing plan in place at discharge in [place] with his Aunt.  TOC team consulted and working with housing authority to assist with long term housing.      LOS: 58 days    Rosario Adie [id number]

## 2021-10-12 NOTE — Progress Notes (Signed)
Pharmacy Antibiotic Note  Phillip Brooks is a 65 y.o. male admitted on 08/08/2021 with  IAI .  Pharmacy has been consulted for Zosyn dosing.  Plan: Zosyn 3.375g IV q8h (4 hour infusion). Will sign off  Height: 5\' 6"  (167.6 cm) Weight: 60.5 kg (133 lb 6.1 oz) IBW/kg (Calculated) : 63.8  Temp (24hrs), Avg:98.5 F (36.9 C), Min:97.3 F (36.3 C), Max:100.2 F (37.9 C)  Recent Labs  Lab 10/06/21 0416 10/08/21 0606 10/08/21 2119 10/09/21 0600 10/10/21 0505 10/11/21 0706 10/12/21 0605  WBC 9.3  --  10.8* 11.2*  --  14.9*  --   CREATININE 0.67 0.64  --  0.86 0.95 1.26* 2.08*    Estimated Creatinine Clearance: 29.9 mL/min (A) (by C-G formula based on SCr of 2.08 mg/dL (H)).    No Known Allergies   Thank you for allowing pharmacy to be a part of this patients care.  Kara Mead 10/12/2021 7:26 AM

## 2021-10-13 LAB — COMPREHENSIVE METABOLIC PANEL
ALT: 42 U/L (ref 0–44)
AST: 19 U/L (ref 15–41)
Albumin: 2.2 g/dL — ABNORMAL LOW (ref 3.5–5.0)
Alkaline Phosphatase: 67 U/L (ref 38–126)
Anion gap: 8 (ref 5–15)
BUN: 67 mg/dL — ABNORMAL HIGH (ref 8–23)
CO2: 29 mmol/L (ref 22–32)
Calcium: 8.5 mg/dL — ABNORMAL LOW (ref 8.9–10.3)
Chloride: 113 mmol/L — ABNORMAL HIGH (ref 98–111)
Creatinine, Ser: 1.65 mg/dL — ABNORMAL HIGH (ref 0.61–1.24)
GFR, Estimated: 46 mL/min — ABNORMAL LOW (ref >60)
Glucose, Bld: 176 mg/dL — ABNORMAL HIGH (ref 70–99)
Potassium: 3.1 mmol/L — ABNORMAL LOW (ref 3.5–5.1)
Sodium: 150 mmol/L — ABNORMAL HIGH (ref 135–145)
Total Bilirubin: 0.4 mg/dL (ref 0.3–1.2)
Total Protein: 7.4 g/dL (ref 6.5–8.1)

## 2021-10-13 LAB — TRIGLYCERIDES: Triglycerides: 102 mg/dL (ref <150)

## 2021-10-13 LAB — HEMOGLOBIN A1C
Hgb A1c MFr Bld: 5.1 % (ref 4.8–5.6)
Mean Plasma Glucose: 99.67 mg/dL

## 2021-10-13 LAB — GLUCOSE, CAPILLARY
Glucose-Capillary: 120 mg/dL — ABNORMAL HIGH (ref 70–99)
Glucose-Capillary: 175 mg/dL — ABNORMAL HIGH (ref 70–99)

## 2021-10-13 LAB — CBC
HCT: 25.8 % — ABNORMAL LOW (ref 39.0–52.0)
Hemoglobin: 7.5 g/dL — ABNORMAL LOW (ref 13.0–17.0)
MCH: 25.7 pg — ABNORMAL LOW (ref 26.0–34.0)
MCHC: 29.1 g/dL — ABNORMAL LOW (ref 30.0–36.0)
MCV: 88.4 fL (ref 80.0–100.0)
Platelets: 572 10*3/uL — ABNORMAL HIGH (ref 150–400)
RBC: 2.92 MIL/uL — ABNORMAL LOW (ref 4.22–5.81)
RDW: 17 % — ABNORMAL HIGH (ref 11.5–15.5)
WBC: 15.6 10*3/uL — ABNORMAL HIGH (ref 4.0–10.5)
nRBC: 0 % (ref 0.0–0.2)

## 2021-10-13 LAB — MAGNESIUM: Magnesium: 2.4 mg/dL (ref 1.7–2.4)

## 2021-10-13 LAB — PHOSPHORUS: Phosphorus: 2.8 mg/dL (ref 2.5–4.6)

## 2021-10-13 MED ORDER — TRAVASOL 10 % IV SOLN
INTRAVENOUS | Status: AC
  Filled 2021-10-13: qty 1101.6

## 2021-10-13 MED ORDER — INSULIN ASPART 100 UNIT/ML IJ SOLN
0.0000 [IU] | INTRAMUSCULAR | Status: DC
  Administered 2021-10-13: 22:00:00 2 [IU] via SUBCUTANEOUS
  Administered 2021-10-14 – 2021-10-15 (×2): 1 [IU] via SUBCUTANEOUS
  Administered 2021-10-16: 23:00:00 3 [IU] via SUBCUTANEOUS
  Administered 2021-10-17 – 2021-10-23 (×5): 1 [IU] via SUBCUTANEOUS

## 2021-10-13 MED ORDER — POTASSIUM CHLORIDE 10 MEQ/100ML IV SOLN
10.0000 meq | INTRAVENOUS | Status: AC
  Administered 2021-10-13 (×4): 10 meq via INTRAVENOUS
  Filled 2021-10-13 (×2): qty 100

## 2021-10-13 NOTE — Consult Note (Signed)
[facility] Nurse ostomy follow up Patient receiving care in [place]. Stoma type/location: RUQ ileostomy Stomal assessment/size: 1 3/8 inches, round, pink, moist Peristomal assessment: intact Treatment options for stomal/peristomal skin: barrier ring Output: thin golden brown Ostomy pouching: 2pc. 2 and 1/4 inch pouching system. Kellie Simmering [phone] for skin barrier, Kellie Simmering #[phone] for Roselee Culver #[phone] for skin barrier Education provided: none Enrolled patient in [facility] Discharge program: Yes, previously.  left upper quadrant colostomy is slightly oval and 1 1/4 inches, pink, non-productive. Pouch changed. Peristomal skin intact. Uses same pouching system for this as the ileostomy.  WOC  will see weekly.  Bedside nurse to perform ostomy care as needed.  Val Riles, RN, MSN, CWOCN, CNS-BC, pager [phone]

## 2021-10-13 NOTE — Progress Notes (Addendum)
PHARMACY - TOTAL PARENTERAL NUTRITION CONSULT NOTE   Indication: intolerance to enteral feeding  Patient Measurements: Height: 5\' 6"  (167.6 cm) Weight: 60.5 kg (133 lb 6.1 oz) IBW/kg (Calculated) : 63.8 TPN AdjBW (KG): 60.1 Body mass index is 21.53 kg/m.  Assessment:  65 yo M presents on 10/28 for eval of rectal cancer. S/P LAR and diverting ileostomy. Pharmacy consulted to start TPN 11/5. Stopped TPN on 11/15 but had to restart on 11/17 as patient vomited as is unable to tolerate PO.  Glucose / Insulin: No Hx DM. CBGs were mostly controlled off SSI however last AM glucose now elevated. Electrolytes: Na elevated at 150 (removed from TPN), K low at 3.1 with increased UOP yesterday. Cl elevated, CorrCa 9.8 WNL Renal: SCr now improving. Peaked at 2.08.(baseline 0.6). BUN down to 67. Hepatic: Albumin remains low; LFTs and Tbili WNL TG: WNL Intake / Output:  net I/O -2.5 L last 24 hrs. - UOP 3000 mL/24 hrs, NG 1000 mL,drain 40 mL, stool 1,050 mL IVF: NS 50 ml/hr by CCS for high output ileostomy GI Imaging: 11/4 CT abdomen shows fluid collection, SBO, bladder thickening  11/17 KUB: Dilated small bowel loops are noted concerning for distal SBO 11/19 CT:  SBO, increase in size of gas and fluid abscess formation  11/28 CTAP: mild decrease in gas/gjuid in presacral space, no change in SBO 12/5 CTAP:  severe dilatation of small bowel loops in the abdomen concerning for ileus/obstruction 12/7 GGF enema: shows anastomotic leak, controlled with drain 12/24 CT: continued pelvic abscess and new RLQ fluid collection GI Surgeries / Procedures:  10/28: LAR/Diverting ileostomy  11/30: Successful CT-guided presacral pelvic abscess drain placement 12/14: Small bowel resection x 2 with end colostomy, LOA; NGT placed for decompression   Central access: Implanted Port TPN start date: 11/5 >> 11/15. Resumed 11/17>>  Nutritional Goals:  Cyclic TPN:  5643 mL provides 110 g of protein and 1984 kcals per  day  RD Assessment: Estimated Needs Total Energy Estimated Needs: 1950-2150 Total Protein Estimated Needs: 100-110g Total Fluid Estimated Needs: 2L/day  Current Nutrition:  NPO > CLD (11/25)> NPO except ice/sips (12/12) TPN transitioned to 12 hour cyclic (32/95)  Plan:  Give KCl 22mEq IV x 4 runs this morning.  At 1884: Continue cyclic TPN. Infuse 2,040 mL over 12 hrs : 93 mL/hr x 1 hr, then 185 mL/hr x 10 hrs, then 93 mL/hr x 1 hr. Electrolytes in TPN Na 0 mEq/L K 30 mEq/L Ca 67mEq/L Mg 58mEq/L Phos 82mmol/L Cl:Ac max acetate Add standard MVI and trace elements to TPN CBGs slightly elevated. Add back sensitive SSI with 3 custom time checks per day based on cyclic TPN IVF per MD  TPN labs on Mon/Thurs, recheck Bmet and Phos tomorrow  Elenor Quinones, PharmD, BCPS, BCIDP Clinical Pharmacist 10/13/2021 7:54 AM

## 2021-10-13 NOTE — Progress Notes (Signed)
Mobility Specialist - Progress Note    10/13/21 1608  Oxygen Therapy  O2 Device Room Air  Mobility  Activity Ambulated in hall  Level of Assistance Standby assist, set-up cues, supervision of patient - no hands on  Assistive Device Front wheel walker  Distance Ambulated (ft) 300 ft  Mobility Ambulated with assistance in hallway  Mobility Response Tolerated well  Mobility performed by Mobility specialist  $Mobility charge 1 Mobility   Upon entry pt was agreeable to mobilize and required steadying assist to sit and stand at EOB. Pt required extra time before ambulating in hallway. Once out, pt ambulated ~300 ft in hallway, took 1 standing rest break of ~1 minute, and c/o of pain at drain site and sacral area. At EOS, pt returned to bed with call bell at side and RN in room to reconnect NGT.   [place] Specialist Acute Rehabilitation Services Phone: [phone] 10/13/21, 4:12 PM

## 2021-10-13 NOTE — Progress Notes (Signed)
12 Days Post-Op LOA/ small bowel resection/ creation of end colostomy  Subjective/Chief Complaint: No new issues   Objective: Vital signs in last 24 hours: Temp:  [97.3 F (36.3 C)-98.5 F (36.9 C)] 98.5 F (36.9 C) (12/26 0649) Pulse Rate:  [72-121] 121 (12/26 0649) Resp:  [15-18] 16 (12/26 0649) BP: (119-133)/(83-86) 131/86 (12/26 0649) SpO2:  [99 %-100 %] 100 % (12/26 0649) Last BM Date: 10/12/21  Intake/Output from previous day: 12/25 0701 - 12/26 0700 In: 2573.4 [P.O.:240; I.V.:2278.8; IV Piggyback:54.6] Out: 3419 [Urine:3025; Emesis/NG output:1000; Drains:40; Stool:1050] Intake/Output this shift: Total I/O In: -  Out: 550 [Urine:250; Emesis/NG output:50; Stool:250]  General appearance: alert, cooperative, and no distress GI: non-distended, appropriately tender in lower abdomen; ostomy pink with liquid stool, colostomy  viable   Incisions OK, JP drain with SS drainage  Lab Results:  Recent Labs    10/11/21 0706 10/13/21 0623  WBC 14.9* 15.6*  HGB 7.3* 7.5*  HCT 25.1* 25.8*  PLT 539* 572*   BMET Recent Labs    10/12/21 0605 10/13/21 0623  NA 151* 150*  K 4.0 3.1*  CL 113* 113*  CO2 28 29  GLUCOSE 139* 176*  BUN 80* 67*  CREATININE 2.08* 1.65*  CALCIUM 8.1* 8.5*   PT/INR No results for input(s): LABPROT, INR in the last 72 hours. ABG No results for input(s): PHART, HCO3 in the last 72 hours.  Invalid input(s): PCO2, PO2  Studies/Results: CT ABDOMEN PELVIS W CONTRAST  Result Date: 10/11/2021 CLINICAL DATA:  Intra-abdominal abscess. History of rectal cancer. Status post low anterior resection with diverting ileostomy. EXAM: CT ABDOMEN AND PELVIS WITH CONTRAST TECHNIQUE: Multidetector CT imaging of the abdomen and pelvis was performed using the standard protocol following bolus administration of intravenous contrast. CONTRAST:  66mL OMNIPAQUE IOHEXOL 350 MG/ML SOLN COMPARISON:  09/22/2021 FINDINGS: Lower chest: Dependent atelectasis bilaterally is  mildly progressive in the interval with new small left pleural effusion. Hepatobiliary: 10 mm low-density lesion in the inferior right liver (28/2) not definitely seen previously. A second medial right liver lesion posterior to the IVC measuring 13 mm on 24/2 is new in the interval. A third subtle 16 mm lesion in the lateral segment left liver noted on image 30/2. Finally, 14 mm ill-defined low-density lesion inferior right liver on 34/2. Previous exam was without intravenous contrast. Comparing to a postcontrast CT of 09/15/2021, these lesions also appear new since that exam. Gallbladder is nondistended. No intrahepatic or extrahepatic biliary dilation. Pancreas: No focal mass lesion. No dilatation of the main duct. No intraparenchymal cyst. No peripancreatic edema. Spleen: No splenomegaly. No focal mass lesion. Adrenals/Urinary Tract: No adrenal nodule or mass. Mild right hydronephrosis is associated with minimal right hydroureter. Left kidney unremarkable. No left hydroureter. Gas is visible in the urinary bladder, presumably secondary to recent instrumentation. Stomach/Bowel: Stomach is nondistended with NG tube tip in the proximal stomach. Duodenum is normally positioned as is the ligament of Treitz. Small bowel loops of the abdomen are fluid-filled and distended up to 4.8 cm diameter. Generally, the small bowel dilatation appears decreased since the abdomen only CT of 09/22/2021 and is similar to minimally decreased comparing to 09/15/2021. Patient has undergone lysis of adhesion with small-bowel resection since the prior 2 studies (postop date 09/30/2021) with small bowel anastomosis identified left paramidline abdomen at the level of the umbilicus and right lower quadrant. A discrete transition zone for the small-bowel obstruction cannot be identified on the current study. Small bowel extending into the right lower quadrant ileostomy  is decompressed. Residual contrast material noted in a nondilated colon  with left lower quadrant sigmoid end colostomy evident. Percutaneous drainage catheter is coiled in a presacral abscess measuring 5.5 x 4.3 cm on image 68/2 today. Vascular/Lymphatic: No abdominal aortic aneurysm. No abdominal lymphadenopathy. No pelvic sidewall lymphadenopathy. Reproductive: The prostate gland and seminal vesicles are unremarkable. Other: On today's exam, there is new gas and debris in the central pelvis, in the region of the peritoneal reflection (image 61/2). There is also a loculated collection of fluid and gas in the anterior right lower quadrant just deep to the rectus sheath measuring 9.4 x 2.2 cm on image 53/2. 1.9 cm collection of gas and fluid is identified in the midline rectus sheath of the pelvis (65/2). Musculoskeletal: No worrisome lytic or sclerotic osseous abnormality. IMPRESSION: 1. Interval lysis of small-bowel adhesion with small-bowel resection and 2 areas of small bowel anastomosis identified low abdomen/pelvis. Small bowel remains dilated on today's study, measuring up to 4.8 cm diameter, suggesting obstruction. Discrete transition zone is not identified although given the presence of increasing extraluminal gas/debris in the central pelvis and an apparent loculated rim enhancing abscess in the anterior right pelvis, inflammatory ileus would be a consideration. Obstruction secondary to adhesion also a possibility. 2. Interval development of increasing gas and debris in the central pelvis, cranial to the presacral abscess in the region of the peritoneal reflection. 3. New loculated collection of fluid and gas in the anterior right lower quadrant just deep to the rectus sheath measuring 9.4 x 2.2 cm and compatible with abscess. 4. 1.9 cm collection of gas and fluid is identified in the midline rectus sheath of the pelvis. Residual gas in the rectus sheath would not be expected more than 10 days after surgery and a small rectus sheath abscess is not excluded. 5. Interval  development of multiple low-density lesions in the liver. Given the presence of infection and relatively rapid appearance, these could represent tiny abscesses although new metastatic disease to the liver is also a consideration. 6. Mild right hydronephrosis with minimal right hydroureter. 7. Gas in the urinary bladder, presumably secondary to recent instrumentation. 8. New small left pleural effusion. Electronically Signed   By: Misty Stanley M.D.   On: 10/11/2021 15:38    Anti-infectives: Anti-infectives (From admission, onward)    Start     Dose/Rate Route Frequency Ordered Stop   10/12/21 1000  piperacillin-tazobactam (ZOSYN) IVPB 3.375 g        3.375 g 12.5 mL/hr over 240 Minutes Intravenous Every 8 hours 10/12/21 0727     09/28/2021 2200  cefoTEtan (CEFOTAN) 1 g in sodium chloride 0.9 % 100 mL IVPB  Status:  Discontinued        1 g 200 mL/hr over 30 Minutes Intravenous Every 12 hours 10/10/2021 1608 09/27/2021 1643   10/14/2021 2200  cefoTEtan (CEFOTAN) 2 g in sodium chloride 0.9 % 100 mL IVPB        2 g 200 mL/hr over 30 Minutes Intravenous  Once 10/18/2021 1644 10/06/2021 2212   10/16/2021 1158  sodium chloride 0.9 % with cefoTEtan (CEFOTAN) ADS Med       Note to Pharmacy: Georgena Spurling W: cabinet override      10/10/2021 1158 10/02/21 0014   09/17/21 1730  piperacillin-tazobactam (ZOSYN) IVPB 3.375 g        3.375 g 12.5 mL/hr over 240 Minutes Intravenous Every 8 hours 09/17/21 1631 09/22/21 1001   09/08/21 1400  cefTRIAXone (ROCEPHIN) 2 g in sodium  chloride 0.9 % 100 mL IVPB       Note to Pharmacy: Pharmacy may adjust dosing strength / duration / interval for maximal efficacy   2 g 200 mL/hr over 30 Minutes Intravenous Every 24 hours 09/08/21 1307 09/14/21 1431   08/26/21 1000  metroNIDAZOLE (FLAGYL) IVPB 500 mg  Status:  Discontinued        500 mg 100 mL/hr over 60 Minutes Intravenous Every 12 hours 08/26/21 0830 08/26/21 0910   08/26/21 1000  piperacillin-tazobactam (ZOSYN) IVPB 3.375 g   Status:  Discontinued        3.375 g 12.5 mL/hr over 240 Minutes Intravenous Every 8 hours 08/26/21 0910 09/01/21 0819   08/25/21 1030  metroNIDAZOLE (FLAGYL) tablet 500 mg  Status:  Discontinued        500 mg Per Tube Every 12 hours 08/25/21 0935 08/26/21 0830   08/25/21 1000  cefTRIAXone (ROCEPHIN) 2 g in sodium chloride 0.9 % 100 mL IVPB  Status:  Discontinued       Note to Pharmacy: Pharmacy may adjust dosing strength / duration / interval for maximal efficacy   2 g 200 mL/hr over 30 Minutes Intravenous Every 24 hours 08/25/21 0823 08/26/21 0910   08/25/21 1000  metroNIDAZOLE (FLAGYL) tablet 500 mg  Status:  Discontinued        500 mg Oral Every 12 hours 08/25/21 0837 08/25/21 0935   07/21/2021 2000  cefoTEtan (CEFOTAN) 2 g in sodium chloride 0.9 % 100 mL IVPB        2 g 200 mL/hr over 30 Minutes Intravenous Every 12 hours 07/28/2021 1647 07/24/2021 2046   07/30/2021 0600  cefoTEtan (CEFOTAN) 2 g in sodium chloride 0.9 % 100 mL IVPB        2 g 200 mL/hr over 30 Minutes Intravenous On call to O.R. 07/19/2021 4627 08/09/2021 0806       Assessment/Plan: Severe malnutrition, prolonged ileus: pt not eating well enough to stop TPN, can't tolerate a diet, cont NPO and NG for now   Dehydration: cont MIV    pSBO: due to pelvic adhesions from pelvic abscess. IR unable to place decompressive PEG tube.  Pt unable to tolerate a PO diet, taken to OR 12/14 for ex lap.  Several loops of small bowel adherent to completely dehisced anastomosis.  SBR x2 with endo colostomy, pathology c/w carcinomatosis.  NG output remains high.    Anemia: most likely related to chronic illness and malnutrition.  Iron supplements ordered but pt did not tolerate this.  Iron levels low.  IV iron given in early Dec, tachy overnight, will recheck and give blood as needed   Anastomotic dehiscence  - JP drainage catheter appears to be in appropriate position, Zosyn d/c'd 11/14.  wbc normal.  - Rpt CT 11/20 shows increased abscess size  with drain in correct position.   restarted drain flushes.  No sign of systemic infection.  Discussed with IR on 11/21.  No further targets available for additional drainage.   7 day course of abx repeated.   - CT 11/28 fluid collection more organized but slightly smaller, IR placed TG drain 11/30, Zosyn x5 days -12/8: GGF enema shows anastomotic leak, controlled with drain - 12/14 complete dehischence of anastomosis found on ex lap.  Colostomy created - 12/22 Abd JP removed CT 12/24 shows continued pelvic abscess and new RLQ fluid collection.  Will restart Zosyn and have IR eval for drainage    ID: wbc elevated off abx.  Zosyn per  pharmacy   Physical deconditioning: Ambulate in hall TID, appreciate PT/OT involvement (Pt refusing most interventions)   Narcotic dependence: dilaudid prn for pain, will wean slowly   Urinary retention: foley replaced on Mon 11/1.  foley out again 11/7, failed voiding trial 11/9 with elevated PVR.   Foley replaced, will need urology f/u as outpatient.   Pt appeared to have a neurogenic bladder during ex lap, foley out, straight cath q8h and start teaching pt to straight cath   Elevated Cr: good UOP, possibly due to obstruction since elevated after removing foley.  Will replace foley and recheck.  If Cr normalizes with this, then he will need urology consult to determine the best way to proceed with his obstructive pathology.     Pt with forming carcinomatous on recent exlap and new liver lesions concerning for mets on most recent CT.  Dr Benay Spice and pt aware.  Nothing further to do until recovered from GI standpoint   Dispo: pt has a temporary housing plan in place at discharge in [place] with his Aunt.  TOC team consulted and working with housing authority to assist with long term housing.    LOS: 59 days    Phillip Brooks 10/13/2021

## 2021-10-14 ENCOUNTER — Inpatient Hospital Stay (HOSPITAL_COMMUNITY): Payer: Medicare HMO

## 2021-10-14 ENCOUNTER — Encounter (HOSPITAL_COMMUNITY): Payer: Self-pay | Admitting: General Surgery

## 2021-10-14 LAB — BASIC METABOLIC PANEL
Anion gap: 11 (ref 5–15)
BUN: 64 mg/dL — ABNORMAL HIGH (ref 8–23)
CO2: 27 mmol/L (ref 22–32)
Calcium: 9.3 mg/dL (ref 8.9–10.3)
Chloride: 114 mmol/L — ABNORMAL HIGH (ref 98–111)
Creatinine, Ser: 1.44 mg/dL — ABNORMAL HIGH (ref 0.61–1.24)
GFR, Estimated: 54 mL/min — ABNORMAL LOW (ref >60)
Glucose, Bld: 164 mg/dL — ABNORMAL HIGH (ref 70–99)
Potassium: 3.4 mmol/L — ABNORMAL LOW (ref 3.5–5.1)
Sodium: 152 mmol/L — ABNORMAL HIGH (ref 135–145)

## 2021-10-14 LAB — GLUCOSE, CAPILLARY
Glucose-Capillary: 102 mg/dL — ABNORMAL HIGH (ref 70–99)
Glucose-Capillary: 123 mg/dL — ABNORMAL HIGH (ref 70–99)
Glucose-Capillary: 130 mg/dL — ABNORMAL HIGH (ref 70–99)
Glucose-Capillary: 161 mg/dL — ABNORMAL HIGH (ref 70–99)

## 2021-10-14 LAB — PHOSPHORUS: Phosphorus: 3.1 mg/dL (ref 2.5–4.6)

## 2021-10-14 MED ORDER — ENOXAPARIN SODIUM 40 MG/0.4ML IJ SOSY
40.0000 mg | PREFILLED_SYRINGE | Freq: Every day | INTRAMUSCULAR | Status: DC
  Administered 2021-10-15 – 2021-11-06 (×23): 40 mg via SUBCUTANEOUS
  Filled 2021-10-14 (×22): qty 0.4

## 2021-10-14 MED ORDER — FENTANYL CITRATE (PF) 100 MCG/2ML IJ SOLN
INTRAMUSCULAR | Status: AC
  Filled 2021-10-14: qty 6

## 2021-10-14 MED ORDER — METHOCARBAMOL 1000 MG/10ML IJ SOLN
1000.0000 mg | Freq: Three times a day (TID) | INTRAMUSCULAR | Status: DC
Start: 2021-10-14 — End: 2021-10-29
  Administered 2021-10-14 – 2021-10-29 (×45): 1000 mg via INTRAVENOUS
  Filled 2021-10-14: qty 1000
  Filled 2021-10-14: qty 10
  Filled 2021-10-14 (×3): qty 1000
  Filled 2021-10-14: qty 10
  Filled 2021-10-14 (×8): qty 1000
  Filled 2021-10-14: qty 10
  Filled 2021-10-14 (×4): qty 1000
  Filled 2021-10-14: qty 10
  Filled 2021-10-14 (×9): qty 1000
  Filled 2021-10-14: qty 10
  Filled 2021-10-14 (×14): qty 1000
  Filled 2021-10-14: qty 10
  Filled 2021-10-14 (×2): qty 1000
  Filled 2021-10-14: qty 10

## 2021-10-14 MED ORDER — FLUMAZENIL 0.5 MG/5ML IV SOLN
INTRAVENOUS | Status: AC
  Filled 2021-10-14: qty 5

## 2021-10-14 MED ORDER — NALOXONE HCL 0.4 MG/ML IJ SOLN
INTRAMUSCULAR | Status: AC
  Filled 2021-10-14: qty 1

## 2021-10-14 MED ORDER — MIDAZOLAM HCL 2 MG/2ML IJ SOLN
INTRAMUSCULAR | Status: AC
  Filled 2021-10-14: qty 4

## 2021-10-14 MED ORDER — POTASSIUM CHLORIDE 10 MEQ/100ML IV SOLN
10.0000 meq | INTRAVENOUS | Status: AC
  Administered 2021-10-14 (×4): 10 meq via INTRAVENOUS
  Filled 2021-10-14: qty 100

## 2021-10-14 MED ORDER — TRAVASOL 10 % IV SOLN
INTRAVENOUS | Status: AC
  Filled 2021-10-14: qty 1101.6

## 2021-10-14 MED ORDER — ACETAMINOPHEN 10 MG/ML IV SOLN
1000.0000 mg | Freq: Four times a day (QID) | INTRAVENOUS | Status: DC
  Administered 2021-10-14 – 2021-10-15 (×3): 1000 mg via INTRAVENOUS
  Filled 2021-10-14 (×4): qty 100

## 2021-10-14 NOTE — Progress Notes (Signed)
Progress Note  13 Days Post-Op  Subjective: Patient reports pain at TG drain site. Patient reports increased output from ileostomy and output changed from green to brownish over the last few days. Still having some abdominal pain as well but feels less bloated. He reports he ambulated down the hall in the last few days.   Objective: Vital signs in last 24 hours: Temp:  [97.4 F (36.3 C)-98.2 F (36.8 C)] 97.9 F (36.6 C) (12/27 0603) Pulse Rate:  [96-97] 97 (12/27 0603) Resp:  [16-18] 18 (12/27 0603) BP: (108-122)/(77-84) 122/84 (12/27 0603) SpO2:  [99 %-100 %] 99 % (12/27 0603) Last BM Date: 10/12/21  Intake/Output from previous day: 12/26 0701 - 12/27 0700 In: 490.6 [P.O.:240; IV Piggyback:250.6] Out: 6780 [Urine:3500; Emesis/NG output:2250; Drains:80; Stool:950] Intake/Output this shift: No intake/output data recorded.  PE: General: pleasant, WD, thin male who is laying in bed in NAD Heart: regular, rate, and rhythm.   Lungs: Respiratory effort nonlabored Abd: soft, appropriately ttp, ND, BS hypoactive, ileostomy protruding some with applesauce consistency brown stool in pouch, colostomy viable with nothing in pouch this AM, L transgluteal drain with purulent appearing material  Psych: A&Ox3 with an appropriate affect.    Lab Results:  Recent Labs    10/13/21 0623  WBC 15.6*  HGB 7.5*  HCT 25.8*  PLT 572*   BMET Recent Labs    10/13/21 0623 10/14/21 0424  NA 150* 152*  K 3.1* 3.4*  CL 113* 114*  CO2 29 27  GLUCOSE 176* 164*  BUN 67* 64*  CREATININE 1.65* 1.44*  CALCIUM 8.5* 9.3   PT/INR No results for input(s): LABPROT, INR in the last 72 hours. CMP     Component Value Date/Time   NA 152 (H) 10/14/2021 0424   K 3.4 (L) 10/14/2021 0424   CL 114 (H) 10/14/2021 0424   CO2 27 10/14/2021 0424   GLUCOSE 164 (H) 10/14/2021 0424   BUN 64 (H) 10/14/2021 0424   CREATININE 1.44 (H) 10/14/2021 0424   CREATININE 0.79 06/09/2021 1020   CALCIUM 9.3  10/14/2021 0424   PROT 7.4 10/13/2021 0623   ALBUMIN 2.2 (L) 10/13/2021 0623   AST 19 10/13/2021 0623   AST 11 (L) 06/09/2021 1020   ALT 42 10/13/2021 0623   ALT 5 06/09/2021 1020   ALKPHOS 67 10/13/2021 0623   BILITOT 0.4 10/13/2021 0623   BILITOT 0.3 06/09/2021 1020   GFRNONAA 54 (L) 10/14/2021 0424   GFRNONAA >60 06/09/2021 1020   GFRAA >60 06/29/2020 0125   Lipase     Component Value Date/Time   LIPASE 36 10/05/2020 0805       Studies/Results: No results found.  Anti-infectives: Anti-infectives (From admission, onward)    Start     Dose/Rate Route Frequency Ordered Stop   10/12/21 1000  piperacillin-tazobactam (ZOSYN) IVPB 3.375 g        3.375 g 12.5 mL/hr over 240 Minutes Intravenous Every 8 hours 10/12/21 0727     10/07/2021 2200  cefoTEtan (CEFOTAN) 1 g in sodium chloride 0.9 % 100 mL IVPB  Status:  Discontinued        1 g 200 mL/hr over 30 Minutes Intravenous Every 12 hours 09/20/2021 1608 10/10/2021 1643   09/30/2021 2200  cefoTEtan (CEFOTAN) 2 g in sodium chloride 0.9 % 100 mL IVPB        2 g 200 mL/hr over 30 Minutes Intravenous  Once 09/30/2021 1644 09/24/2021 2212   09/28/2021 1158  sodium chloride 0.9 % with  cefoTEtan (CEFOTAN) ADS Med       Note to Pharmacy: Lesia Hausen: cabinet override      10/16/2021 1158 10/02/21 0014   09/17/21 1730  piperacillin-tazobactam (ZOSYN) IVPB 3.375 g        3.375 g 12.5 mL/hr over 240 Minutes Intravenous Every 8 hours 09/17/21 1631 09/22/21 1001   09/08/21 1400  cefTRIAXone (ROCEPHIN) 2 g in sodium chloride 0.9 % 100 mL IVPB       Note to Pharmacy: Pharmacy may adjust dosing strength / duration / interval for maximal efficacy   2 g 200 mL/hr over 30 Minutes Intravenous Every 24 hours 09/08/21 1307 09/14/21 1431   08/26/21 1000  metroNIDAZOLE (FLAGYL) IVPB 500 mg  Status:  Discontinued        500 mg 100 mL/hr over 60 Minutes Intravenous Every 12 hours 08/26/21 0830 08/26/21 0910   08/26/21 1000  piperacillin-tazobactam (ZOSYN) IVPB  3.375 g  Status:  Discontinued        3.375 g 12.5 mL/hr over 240 Minutes Intravenous Every 8 hours 08/26/21 0910 09/01/21 0819   08/25/21 1030  metroNIDAZOLE (FLAGYL) tablet 500 mg  Status:  Discontinued        500 mg Per Tube Every 12 hours 08/25/21 0935 08/26/21 0830   08/25/21 1000  cefTRIAXone (ROCEPHIN) 2 g in sodium chloride 0.9 % 100 mL IVPB  Status:  Discontinued       Note to Pharmacy: Pharmacy may adjust dosing strength / duration / interval for maximal efficacy   2 g 200 mL/hr over 30 Minutes Intravenous Every 24 hours 08/25/21 0823 08/26/21 0910   08/25/21 1000  metroNIDAZOLE (FLAGYL) tablet 500 mg  Status:  Discontinued        500 mg Oral Every 12 hours 08/25/21 0837 08/25/21 0935   08/04/2021 2000  cefoTEtan (CEFOTAN) 2 g in sodium chloride 0.9 % 100 mL IVPB        2 g 200 mL/hr over 30 Minutes Intravenous Every 12 hours 08/11/2021 1647 08/11/2021 2046   07/28/2021 0600  cefoTEtan (CEFOTAN) 2 g in sodium chloride 0.9 % 100 mL IVPB        2 g 200 mL/hr over 30 Minutes Intravenous On call to O.R. 07/23/2021 1093 08/14/2021 0806        Assessment/Plan Severe malnutrition, prolonged ileus - on TPN    Dehydration: cont MIV    pSBO: due to pelvic adhesions from pelvic abscess. IR unable to place decompressive PEG tube.  Pt unable to tolerate a PO diet, taken to OR 12/14 for ex lap.  Several loops of small bowel adherent to completely dehisced anastomosis.  SBR x2 with endo colostomy, pathology c/w carcinomatosis.  NG output remains high but having more ostomy output. Clamping trials to start today with 4 hrs clamped and 2 hrs LIWS   Anemia: hgb stable at 7.5 as of yesterday    Anastomotic dehiscence  - JP drainage catheter appears to be in appropriate position, Zosyn d/c'd 11/14.  wbc normal.  - Rpt CT 11/20 shows increased abscess size with drain in correct position.   restarted drain flushes.  No sign of systemic infection.  Discussed with IR on 11/21.  No further targets available  for additional drainage.   7 day course of abx repeated.   - CT 11/28 fluid collection more organized but slightly smaller, IR placed TG drain 11/30, Zosyn x5 days -12/8: GGF enema shows anastomotic leak, controlled with drain - 12/14 complete dehischence of anastomosis  found on ex lap.  Colostomy created - 12/22 Abd JP removed CT 12/24 shows continued pelvic abscess and new RLQ fluid collection.  Will restart Zosyn and have IR eval for drainage    ID: wbc elevated off abx, recheck tomorrow.  Zosyn per pharmacy   Physical deconditioning: Ambulate in hall TID, appreciate PT/OT involvement (Pt refusing most interventions)   Narcotic dependence: dilaudid prn for pain, will wean slowly; added IV tylenol today and scheduled IV robaxin    Urinary retention: foley replaced on Mon 11/1.  foley out again 11/7, failed voiding trial 11/9 with elevated PVR.  Foley replaced, will need urology f/u as outpatient.  Pt appeared to have a neurogenic bladder during ex lap. Will touch base with urology today.   Elevated Cr - improving   Pt with forming carcinomatous on recent exlap and new liver lesions concerning for mets on most recent CT.  Dr Benay Spice and pt aware.  Nothing further to do until recovered from GI standpoint   Dispo: pt has a temporary housing plan in place at discharge in [place] with his Aunt.  TOC team consulted and working with housing authority to assist with long term housing. Await return in bowel function.  LOS: 60 days    Norm Parcel, [facility] 10/14/2021, 9:11 AM Please see Amion for pager number during day hours 7:00am-4:30pm

## 2021-10-14 NOTE — Progress Notes (Signed)
PHARMACY - TOTAL PARENTERAL NUTRITION CONSULT NOTE   Indication: intolerance to enteral feeding  Patient Measurements: Height: 5\' 6"  (167.6 cm) Weight: 60.5 kg (133 lb 6.1 oz) IBW/kg (Calculated) : 63.8 TPN AdjBW (KG): 60.1 Body mass index is 21.53 kg/m.  Assessment:  65 yo M presents on 10/28 for eval of rectal cancer. S/P LAR and diverting ileostomy. Pharmacy consulted to start TPN 11/5. Stopped TPN on 11/15 but had to restart on 11/17 as patient vomited as is unable to tolerate PO.  Glucose / Insulin: No Hx DM. CBGs were controlled off SSI however AM glucose readings had been increasing over past several days. Restarted SSI on 12/26. CBGs 120-175 with 3 units in past 24 hrs. Electrolytes: Na elevated at 152 (none in TPN since 12/24), K low at 3.4. Cl elevated, CorrCa 10.6. Renal: SCr now improving. Peaked at 2.08.(baseline 0.6). BUN down to 64. Hepatic: Albumin remains low; LFTs and Tbili WNL TG: WNL Intake / Output:  net I/O -4.5 L last 24 hrs. - UOP 3500 mL/24 hrs, NG 2250 mL,drain 80 mL, stool 950 mL IVF: NS stopped on 12/24. GI Imaging: 11/4 CT abdomen shows fluid collection, SBO, bladder thickening  11/17 KUB: Dilated small bowel loops are noted concerning for distal SBO 11/19 CT:  SBO, increase in size of gas and fluid abscess formation  11/28 CTAP: mild decrease in gas/gjuid in presacral space, no change in SBO 12/5 CTAP:  severe dilatation of small bowel loops in the abdomen concerning for ileus/obstruction 12/7 GGF enema: shows anastomotic leak, controlled with drain 12/24 CT: continued pelvic abscess and new RLQ fluid collection GI Surgeries / Procedures:  10/28: LAR/Diverting ileostomy  11/30: Successful CT-guided presacral pelvic abscess drain placement 12/14: Small bowel resection x 2 with end colostomy, LOA; NGT placed for decompression   Central access: Implanted Port TPN start date: 11/5 >> 11/15. Resumed 11/17>>  Nutritional Goals:  Cyclic TPN:  6659 mL  provides 110 g of protein and 1984 kcals per day  RD Assessment: Estimated Needs Total Energy Estimated Needs: 1950-2150 Total Protein Estimated Needs: 100-110g Total Fluid Estimated Needs: 2L/day  Current Nutrition:  NPO TPN transitioned to 12 hour cyclic (93/57)  Plan:  Give KCl 28mEq IV x 4 runs today  At 0177: Continue cyclic TPN. Infuse 2,040 mL over 12 hrs : 93 mL/hr x 1 hr, then 185 mL/hr x 10 hrs, then 93 mL/hr x 1 hr. Electrolytes in TPN Na 0 mEq/L K 40 mEq/L Ca 38mEq/L Mg 38mEq/L Phos 75mmol/L Cl:Ac max acetate Add standard MVI and trace elements to TPN Continue sensitive SSI with 3 custom time checks per day based on cyclic TPN TPN labs on Mon/Thurs  Having significant output past few days. If signs of dehydration, would recommend restarting maintenance IV fluids if needed.  Elenor Quinones, PharmD, BCPS, BCIDP Clinical Pharmacist 10/14/2021 7:39 AM

## 2021-10-14 NOTE — Progress Notes (Addendum)
Nutrition Follow-up  DOCUMENTATION CODES:   Severe malnutrition in context of chronic illness  INTERVENTION:   -Cyclic TPN management per Pharmacy  NUTRITION DIAGNOSIS:   Severe Malnutrition related to chronic illness, cancer and cancer related treatments as evidenced by severe fat depletion, severe muscle depletion.  Ongoing.  GOAL:   Patient will meet greater than or equal to 90% of their needs  Meeting with TPN.  MONITOR:   Diet advancement, Labs, Weight trends, Skin, I & O's, Other (Comment) (TPN)  ASSESSMENT:   65 year old male who presented on 10/28 for surgery. PMH of stage III rectal cancer s/p chemotherapy and radiation.  10/28 - s/p cystoscopy with balloon dilation of bulbar urethral stricture and bilateral ureteral catheterization, lower anterior colon resection with diverting ileostomy; clear liquids 10/29 - NPO 10/30 - full liquids 10/31 - NPO 11/2 - NGT placed with significant output 11/4 - CT abdomen showing fluid collection, SBO, bladder thickening 11/5 - TPN started 11/7 - NGT removed 11/9 - CLD 11/10 - FLD 11/11 - Soft diet  11/15 - TPN stopped 11/17 - emesis x2, TPN restarted 11/30 -IR placed TG drain 12/14- s/p LOA, SB resection x 2 w/ end colostomy, NGT replaced   Patient continue to be NPO. NGT set to suction, output: 2250 ml x 24 hrs.  Cyclic TPN x 12 hours continues, providing 1983 kcals and 110g protein  Admission weight: 131 lbs. Current weight: 133 lbs.  Medications: KCl   Labs reviewed: CBGs: 120-175 Elevated Na Low K TG 102  Diet Order:   Diet Order             Diet NPO time specified Except for: Ice Chips, Sips with Meds  Diet effective now                   EDUCATION NEEDS:   Education needs have been addressed  Skin:  Skin Assessment: Skin Integrity Issues: Skin Integrity Issues:: Incisions Incisions: abdomen  Last BM:  12/27 -type 7 -colostomy  Height:   Ht Readings from Last 1 Encounters:   10/12/2021 5\' 6"  (1.676 m)    Weight:   Wt Readings from Last 1 Encounters:  10/12/21 60.5 kg    BMI:  Body mass index is 21.53 kg/m.  Estimated Nutritional Needs:   Kcal:  1950-2150  Protein:  100-110g  Fluid:  2L/day  Clayton Bibles, MS, RD, LDN Inpatient Clinical Dietitian Contact information available via Amion

## 2021-10-14 NOTE — Consult Note (Signed)
Chief Complaint: Patient was seen in consultation today for image guided aspiration and possible drain placement  for RLQ intraabd fluid collection at the request of Jaymes Graff   Referring Physician(s): Jaymes Graff   Supervising Physician: Ruthann Cancer  Patient Status: [facility] - In-pt  History of Present Illness: Phillip Brooks is a 65 y.o. male with PMHs of rectal CA s/p LAR, diverting ostomy on 82/50 complicated by intraabdominal fluid collection s/p drain placement by IR on 11/30. Further complicated by anastomotic leak seen on BE 12/8. Also s/p lysis of adhesion and small bowel resection with end colostomy on 12/14. Repeat CT on 12/24 showed new loculated collection of fluid and gas in the anterior right lower quadrant.   IR was requested for image guised aspiration and possible drain placement for the RLQ collection.  Case was reviewed and approved by Dr. Anselm Pancoast.  Patient seen in CT.  Patient laying in bed, not in acute distress.  Patient states that he was not aware of the additional procedure, and he does not comfortable proceeding today.  Informed the patient that IR has a busy schedule this week and not sure when we will be able to accommodate him if we do not do this procedure today.  After lengthy discussion with the patient, patient states that he does not want additional procedure today, he asks IR to try to do it on this Thursday 12/29.  Patient also states he does not want to sing informed consent at the moment, asks IR APP to come up to his room and discuss the procedure on Thursday.  Informed the patient that this PA has disclosed all the information about the procedure including benefits, risks, and how conscious sedation works, and it will be the same information that he will receive by other IR APP.  Patient verbalized understanding, still asks IR APP to re-discuss the procedure on Thursday.   Patient to be returned to the floor.  The procedure os tentatively  scheduled for this Thursday pending CT/IR schedule.  Referring MD notified via secure chat.    Past Medical History:  Diagnosis Date   Anemia    Rectal cancer ([place])    Stage III   Seasonal allergies     Past Surgical History:  Procedure Laterality Date   BOWEL RESECTION N/A 09/21/2021   Procedure: SMALL BOWEL RESECTION X 2 WITH END COLOSTOMY;  Surgeon: Leighton Ruff, MD;  Location: [facility];  Service: General;  Laterality: N/A;   COLONOSCOPY     DIVERTING ILEOSTOMY N/A 07/21/2021   Procedure: DIVERTING ILEOSTOMY;  Surgeon: Leighton Ruff, MD;  Location: [facility];  Service: General;  Laterality: N/A;   LYSIS OF ADHESION N/A 10/02/2021   Procedure: LYSIS OF ADHESION;  Surgeon: Leighton Ruff, MD;  Location: [facility];  Service: General;  Laterality: N/A;   PORTACATH PLACEMENT Right 01/03/2021   Procedure: INSERTION PORT-A-CATH ULTRASOUND GUIDED THROUGH THE RIGHT IJ.;  Surgeon: Leighton Ruff, MD;  Location: [facility];  Service: General;  Laterality: Right;  ROOM 5 STARTING  AT 12:00PM FOR 60 MIN   TONSILLECTOMY     WRIST SURGERY Right    XI ROBOTIC ASSISTED LOWER ANTERIOR RESECTION N/A 08/14/2021   Procedure: XI ROBOTIC ASSISTED LOWER ANTERIOR RESECTION, WITH BILATERAL TAP BLOCK AND INTRAOPERATIVE ASSESSMENT USING ICG;  Surgeon: Leighton Ruff, MD;  Location: [facility];  Service: General;  Laterality: N/A;    Allergies: Patient has no known allergies.  Medications: Prior to Admission medications   Medication Sig Start Date  End Date Taking? Authorizing Provider  acetaminophen (TYLENOL) 650 MG CR tablet Take 650 mg by mouth every 8 (eight) hours as needed for pain.   Yes [provider]  BISACODYL 5 MG EC tablet Take 20 mg by mouth once as needed for constipation. 07/21/21  Yes [provider]  oxyCODONE (OXY IR/ROXICODONE) 5 MG immediate release tablet Take 1 tablet (5 mg total) by mouth every 6 (six) hours as needed for severe pain. 08/11/21  Yes Owens Shark, NP   polyethylene glycol powder (GLYCOLAX/MIRALAX) 17 GM/SCOOP powder Take 238 g by mouth once as needed. Colonoscopy prep 07/21/21  Yes [provider]  capecitabine (XELODA) 500 MG tablet Take 3 tablets (1,500 mg total) by mouth 2 (two) times daily after a meal. Take Monday-Friday. Take only on days of radiation. Patient not taking: No sig reported 04/18/21   Ladell Pier, MD  metroNIDAZOLE (FLAGYL) 500 MG tablet Take 1,000 mg by mouth 3 (three) times daily. 2 day supply 07/21/21   [provider]  neomycin (MYCIFRADIN) 500 MG tablet Take 1,000 mg by mouth 3 (three) times daily. 2 day supply 07/21/21   [provider]  traMADol (ULTRAM) 50 MG tablet Take 50 mg by mouth every 12 (twelve) hours as needed for moderate pain. 06/25/21   [provider]     Family History  Problem Relation Age of Onset   Hypertension Mother    Heart attack Father    Cancer Sister    Stomach cancer Neg Hx    Colon cancer Neg Hx    Esophageal cancer Neg Hx    Pancreatic cancer Neg Hx    Rectal cancer Neg Hx     Social History   Socioeconomic History   Marital status: Divorced    Spouse name: Not on file   Number of children: Not on file   Years of education: Not on file   Highest education level: Not on file  Occupational History   Not on file  Tobacco Use   Smoking status: Never   Smokeless tobacco: Current    Types: Snuff, Chew  Vaping Use   Vaping Use: Never used  Substance and Sexual Activity   Alcohol use: Yes    Comment: daily beer    Drug use: No   Sexual activity: Yes    Birth control/protection: Condom  Other Topics Concern   Not on file  Social History Narrative   Not on file   Social Determinants of Health   Financial Resource Strain: Not on file  Food Insecurity: Not on file  Transportation Needs: Not on file  Physical Activity: Not on file  Stress: Not on file  Social Connections: Not on file     Review of Systems: A 12 point ROS discussed  and pertinent positives are indicated in the HPI above.  All other systems are negative.  Vital Signs: BP 122/84 (BP Location: Right Arm)    Pulse 97    Temp 97.9 F (36.6 C) (Oral)    Resp 18    Ht 5\' 6"  (1.676 m)    Wt 133 lb 6.1 oz (60.5 kg)    SpO2 99%    BMI 21.53 kg/m    Physical Exam Vitals reviewed.  Constitutional:      General: He is not in acute distress.    Appearance: Normal appearance. He is ill-appearing.  HENT:     Head: Normocephalic and atraumatic.  Neurological:     Mental Status: He  is alert.    MD Evaluation Airway: WNL Heart: WNL Abdomen: WNL Chest/ Lungs: WNL ASA  Classification: 3 Mallampati/Airway Score: Two  Imaging: CT ABDOMEN WO CONTRAST  Result Date: 09/22/2021 CLINICAL DATA:  Bowel obstruction suspected. EXAM: CT ABDOMEN WITHOUT CONTRAST TECHNIQUE: Multidetector CT imaging of the abdomen was performed following the standard protocol without IV contrast. COMPARISON:  None. FINDINGS: Lower chest: Bibasilar atelectasis, right greater than the left. Hepatobiliary: No focal liver abnormality is seen. No gallstones, gallbladder wall thickening, or biliary dilatation. Pancreas: Unremarkable. No pancreatic ductal dilatation or surrounding inflammatory changes. Spleen: Normal in size without focal abnormality. Adrenals/Urinary Tract: Adrenal glands are unremarkable. Kidneys are normal, without renal calculi, focal lesion, or hydronephrosis. Stomach/Bowel: The stomach is distended with ingested food. There are multiple dilated small bowel loops with air-fluid levels. Ostomy in the right anterior abdominal wall is unchanged. Vascular/Lymphatic: No significant vascular findings are present. No enlarged abdominal or pelvic lymph nodes. Other: No abdominal wall hernia or abnormality. Partially imaged right lateral abdominal wall access drain is noted. Musculoskeletal: No fracture is seen. IMPRESSION: 1. Multiple dilated small bowel loops concerning for ileus/obstruction,  unchanged. 2.  Right anterior abdominal wall ostomy is unchanged. 3.  Bibasilar subsegmental atelectasis, right greater than the left. Electronically Signed   By: Keane Police D.O.   On: 09/22/2021 16:38   CT ABDOMEN PELVIS W CONTRAST  Result Date: 10/11/2021 CLINICAL DATA:  Intra-abdominal abscess. History of rectal cancer. Status post low anterior resection with diverting ileostomy. EXAM: CT ABDOMEN AND PELVIS WITH CONTRAST TECHNIQUE: Multidetector CT imaging of the abdomen and pelvis was performed using the standard protocol following bolus administration of intravenous contrast. CONTRAST:  2mL OMNIPAQUE IOHEXOL 350 MG/ML SOLN COMPARISON:  09/22/2021 FINDINGS: Lower chest: Dependent atelectasis bilaterally is mildly progressive in the interval with new small left pleural effusion. Hepatobiliary: 10 mm low-density lesion in the inferior right liver (28/2) not definitely seen previously. A second medial right liver lesion posterior to the IVC measuring 13 mm on 24/2 is new in the interval. A third subtle 16 mm lesion in the lateral segment left liver noted on image 30/2. Finally, 14 mm ill-defined low-density lesion inferior right liver on 34/2. Previous exam was without intravenous contrast. Comparing to a postcontrast CT of 09/15/2021, these lesions also appear new since that exam. Gallbladder is nondistended. No intrahepatic or extrahepatic biliary dilation. Pancreas: No focal mass lesion. No dilatation of the main duct. No intraparenchymal cyst. No peripancreatic edema. Spleen: No splenomegaly. No focal mass lesion. Adrenals/Urinary Tract: No adrenal nodule or mass. Mild right hydronephrosis is associated with minimal right hydroureter. Left kidney unremarkable. No left hydroureter. Gas is visible in the urinary bladder, presumably secondary to recent instrumentation. Stomach/Bowel: Stomach is nondistended with NG tube tip in the proximal stomach. Duodenum is normally positioned as is the ligament of  Treitz. Small bowel loops of the abdomen are fluid-filled and distended up to 4.8 cm diameter. Generally, the small bowel dilatation appears decreased since the abdomen only CT of 09/22/2021 and is similar to minimally decreased comparing to 09/15/2021. Patient has undergone lysis of adhesion with small-bowel resection since the prior 2 studies (postop date 09/23/2021) with small bowel anastomosis identified left paramidline abdomen at the level of the umbilicus and right lower quadrant. A discrete transition zone for the small-bowel obstruction cannot be identified on the current study. Small bowel extending into the right lower quadrant ileostomy is decompressed. Residual contrast material noted in a nondilated colon with left lower quadrant sigmoid end  colostomy evident. Percutaneous drainage catheter is coiled in a presacral abscess measuring 5.5 x 4.3 cm on image 68/2 today. Vascular/Lymphatic: No abdominal aortic aneurysm. No abdominal lymphadenopathy. No pelvic sidewall lymphadenopathy. Reproductive: The prostate gland and seminal vesicles are unremarkable. Other: On today's exam, there is new gas and debris in the central pelvis, in the region of the peritoneal reflection (image 61/2). There is also a loculated collection of fluid and gas in the anterior right lower quadrant just deep to the rectus sheath measuring 9.4 x 2.2 cm on image 53/2. 1.9 cm collection of gas and fluid is identified in the midline rectus sheath of the pelvis (65/2). Musculoskeletal: No worrisome lytic or sclerotic osseous abnormality. IMPRESSION: 1. Interval lysis of small-bowel adhesion with small-bowel resection and 2 areas of small bowel anastomosis identified low abdomen/pelvis. Small bowel remains dilated on today's study, measuring up to 4.8 cm diameter, suggesting obstruction. Discrete transition zone is not identified although given the presence of increasing extraluminal gas/debris in the central pelvis and an apparent  loculated rim enhancing abscess in the anterior right pelvis, inflammatory ileus would be a consideration. Obstruction secondary to adhesion also a possibility. 2. Interval development of increasing gas and debris in the central pelvis, cranial to the presacral abscess in the region of the peritoneal reflection. 3. New loculated collection of fluid and gas in the anterior right lower quadrant just deep to the rectus sheath measuring 9.4 x 2.2 cm and compatible with abscess. 4. 1.9 cm collection of gas and fluid is identified in the midline rectus sheath of the pelvis. Residual gas in the rectus sheath would not be expected more than 10 days after surgery and a small rectus sheath abscess is not excluded. 5. Interval development of multiple low-density lesions in the liver. Given the presence of infection and relatively rapid appearance, these could represent tiny abscesses although new metastatic disease to the liver is also a consideration. 6. Mild right hydronephrosis with minimal right hydroureter. 7. Gas in the urinary bladder, presumably secondary to recent instrumentation. 8. New small left pleural effusion. Electronically Signed   By: Misty Stanley M.D.   On: 10/11/2021 15:38   CT ABDOMEN PELVIS W CONTRAST  Result Date: 09/15/2021 CLINICAL DATA:  Follow-up pelvic abscess. Status post rectal cancer resection on 08/13/2021. EXAM: CT ABDOMEN AND PELVIS WITH CONTRAST TECHNIQUE: Multidetector CT imaging of the abdomen and pelvis was performed using the standard protocol following bolus administration of intravenous contrast. CONTRAST:  5mL OMNIPAQUE IOHEXOL 350 MG/ML SOLN COMPARISON:  09/06/2021 FINDINGS: Lower chest: No significant change in bilateral lower lobe atelectasis/scarring. Hepatobiliary: No focal liver abnormality is seen. No gallstones, gallbladder wall thickening, or biliary dilatation. Pancreas: Unremarkable. No pancreatic ductal dilatation or surrounding inflammatory changes. Spleen: Normal  in size without focal abnormality. Adrenals/Urinary Tract: Urine and air in the urinary bladder with a Foley catheter in place. No significant change in mild bilateral hydronephrosis and proximal hydroureter. Unremarkable adrenal glands. Stomach/Bowel: Again demonstrated are multiple markedly dilated fluid and gas-filled small bowel loops with a transition point in the mid pelvis. Stomach remains mildly dilated and filled with fluid and gas. Normal caliber right colon with an anterior colostomy again demonstrated on the right. Vascular/Lymphatic: No significant vascular findings are present. No enlarged abdominal or pelvic lymph nodes. Reproductive: Mildly to moderately enlarged prostate gland. Other: Again demonstrated is a drainage tube in the presacral space with an a presacral gas and fluid collection. This appears similar to the previous examination, measuring 5.3 x 5.0 cm  on image number 69/2 and 4.2 cm in length on sagittal image number 61/5. This measures 5.9 x 5.3 x 5.2 cm on 09/06/2021. Musculoskeletal: Lumbar and lower thoracic spine degenerative changes. IMPRESSION: 1. Mild decrease in size of the gas and fluid collection in the presacral space, currently measuring 5.3 x 5.0 x 4.2 cm. 2. No significant change in small-bowel obstruction with the transition point in the mid pelvis. 3. Stable mild bilateral hydronephrosis. Electronically Signed   By: Claudie Revering M.D.   On: 09/15/2021 16:17   DG BE (COLON)W SINGLE CM (SOL OR THIN BA)  Result Date: 09/25/2021 CLINICAL DATA:  Evaluate for anastomotic leak.  History of APR. EXAM: WATER SOLUBLE CONTRAST ENEMA TECHNIQUE: Contrast enema was performed using water-soluble contrast material per rectum. FLUOROSCOPY TIME:  Fluoroscopy Time:  2 minutes and 12 seconds Radiation Exposure Index (if provided by the fluoroscopic device): 42.9 mGy Number of Acquired Spot Images: 0 COMPARISON:  CT scan 09/15/2021 FINDINGS: Extravasation of contrast noted along the  right-side of the anastomosis in the region of the right-sided drainage 2. Possible second area of extravasation noted on the left side although this could communicate with the other area. The rest of the colon is unremarkable. IMPRESSION: Extravasation of contrast suggesting an anastomotic leak. Electronically Signed   By: Marijo Sanes M.D.   On: 09/25/2021 10:05   CT IMAGE GUIDED DRAINAGE BY PERCUTANEOUS CATHETER  Result Date: 09/17/2021 INDICATION: Presacral pelvic abscess postoperatively. EXAM: CT LEFT TRANSGLUTEAL PRESACRAL ABSCESS DRAIN PLACEMENT. MEDICATIONS: The patient is currently admitted to the hospital and receiving intravenous antibiotics. The antibiotics were administered within an appropriate time frame prior to the initiation of the procedure. ANESTHESIA/SEDATION: Moderate (conscious) sedation was employed during this procedure. A total of Versed 3.0 mg and Fentanyl 100 mcg was administered intravenously by the radiology nurse. Total intra-service moderate Sedation Time: 17 minutes. The patient's level of consciousness and vital signs were monitored continuously by radiology nursing throughout the procedure under my direct supervision. COMPLICATIONS: None immediate. PROCEDURE: Informed written consent was obtained from the patient after a thorough discussion of the procedural risks, benefits and alternatives. All questions were addressed. Maximal Sterile Barrier Technique was utilized including caps, mask, sterile gowns, sterile gloves, sterile drape, hand hygiene and skin antiseptic. A timeout was performed prior to the initiation of the procedure. previous imaging reviewed. patient positioned right side down decubitus. noncontrast localization ct performed. the complex air-fluid collection in the presacral area of the lower pelvis was localized and marked for a left trans gluteal approach. under sterile conditions and local anesthesia, an 18 gauge 10 cm access needle was advanced into the  presacral abscess. needle position confirmed with ct. guidewire inserted followed by tract dilatation to insert a 10 french drain. drain catheter retention loop formed in the presacral abscess. position confirmed with ct. syringe aspiration yielded 10 cc mildly fecal contaminated exudative fluid. sample sent for culture. catheter secured with prolene suture and connected to external suction bulb. sterile dressing applied. no immediate complication. patient tolerated the procedure well. IMPRESSION: Successful CT-guided presacral pelvic abscess drain placement. Electronically Signed   By: Jerilynn Mages.  Shick M.D.   On: 09/17/2021 15:26    Labs:  CBC: Recent Labs    10/08/21 2119 10/09/21 0600 10/11/21 0706 10/13/21 0623  WBC 10.8* 11.2* 14.9* 15.6*  HGB 7.5* 7.3* 7.3* 7.5*  HCT 25.0* 24.8* 25.1* 25.8*  PLT 444* 467* 539* 572*    COAGS: Recent Labs    12/06/20 1706  INR 1.1*  BMP: Recent Labs    10/11/21 0706 10/12/21 0605 10/13/21 0623 10/14/21 0424  NA 152* 151* 150* 152*  K 4.0 4.0 3.1* 3.4*  CL 115* 113* 113* 114*  CO2 29 28 29 27   GLUCOSE 125* 139* 176* 164*  BUN 56* 80* 67* 64*  CALCIUM 8.2* 8.1* 8.5* 9.3  CREATININE 1.26* 2.08* 1.65* 1.44*  GFRNONAA >60 34* 46* 54*    LIVER FUNCTION TESTS: Recent Labs    10/04/21 0454 10/06/21 0416 10/09/21 0600 10/13/21 0623  BILITOT 0.2* 0.3 0.3 0.4  AST 12* 24 27 19   ALT 10 22 52* 42  ALKPHOS 41 43 54 67  PROT 6.2* 6.2* 6.6 7.4  ALBUMIN 1.9* 2.1* 2.1* 2.2*    TUMOR MARKERS: Recent Labs    12/06/20 1706 05/07/21 0826 07/28/21 1120  CEA 3.8* 5.64* 1.76    Assessment and Plan: 65 y.o. male with rectal CA s/p LAR, diverting ostomy on 25/95 complicated by intraabdominal fluid collection s/p drain placement by IR on 11/30. Further complicated by anastomotic leak seen on BE 12/8. Also s/p lysis of adhesion and small bowel resection with end colostomy on 12/14. Repeat CT on 12/24 showed new loculated collection of fluid and  gas in the anterior right lower quadrant.   IR was requested for image guised aspiration and possible drain placement for the RLQ collection.  Case was reviewed and approved by Dr. Anselm Pancoast.   Patient was brought down to CT today for the procedure, ultimately refused after lengthy discussion with this PA.  The procedure is re-scheduled for this Thursday per patient request, team notified but unclear if CT/IR will be able to accommodate the procedure on  Thursday.  Patient verbalized understanding.   Pt on TPN VSS on 12/27 CBC yesterday showed worsening of leukocytosis  INR 1.1 on 2/18 not on AC/AP currently  OP from L TG drain 80 cc overnight, cx from 11/20 showed E. coli and E. avium   Risks and benefits discussed with the patient including bleeding, infection, damage to adjacent structures, bowel perforation/fistula connection, and sepsis.  Will need to see the patient in room and obtain consent on Thursday.  Thank you for this interesting consult.  I greatly enjoyed meeting Phillip Brooks and look forward to participating in their care.  A copy of this report was sent to the requesting provider on this date.  Electronically Signed: Tera Mater, PA-C 10/14/2021, 11:41 AM   I spent a total of 30 min  in face to face in clinical consultation, greater than 50% of which was counseling/coordinating care for RLQ drain placement.   This chart was dictated using voice recognition software.  Despite best efforts to proofread,  errors can occur which can change the documentation meaning.

## 2021-10-15 LAB — BASIC METABOLIC PANEL
Anion gap: 9 (ref 5–15)
BUN: 55 mg/dL — ABNORMAL HIGH (ref 8–23)
CO2: 21 mmol/L — ABNORMAL LOW (ref 22–32)
Calcium: 8.8 mg/dL — ABNORMAL LOW (ref 8.9–10.3)
Chloride: 111 mmol/L (ref 98–111)
Creatinine, Ser: 0.97 mg/dL (ref 0.61–1.24)
GFR, Estimated: >60 mL/min (ref >60)
Glucose, Bld: 129 mg/dL — ABNORMAL HIGH (ref 70–99)
Potassium: 3.8 mmol/L (ref 3.5–5.1)
Sodium: 141 mmol/L (ref 135–145)

## 2021-10-15 LAB — GLUCOSE, CAPILLARY
Glucose-Capillary: 97 mg/dL (ref 70–99)
Glucose-Capillary: 95 mg/dL (ref 70–99)
Glucose-Capillary: 138 mg/dL — ABNORMAL HIGH (ref 70–99)

## 2021-10-15 MED ORDER — TRAVASOL 10 % IV SOLN
INTRAVENOUS | Status: AC
  Filled 2021-10-15: qty 1101.6

## 2021-10-15 MED ORDER — ACETAMINOPHEN 10 MG/ML IV SOLN
1000.0000 mg | Freq: Four times a day (QID) | INTRAVENOUS | Status: AC
  Administered 2021-10-15 – 2021-10-16 (×4): 1000 mg via INTRAVENOUS
  Filled 2021-10-15 (×4): qty 100

## 2021-10-15 NOTE — Progress Notes (Signed)
NG tube clamped X 4 hrs this am w/o nausea. When NG hooked back up to suction, it drained 200 cc's greenish material w sediment/clumps. Approx 30 min later pt vomited 50 cc's greenish material. Pt stated that him "sitting on the edge of the bed" made him nauseous and caused the vomiting. PRN given and NG tube remains to LIWS.

## 2021-10-15 NOTE — Progress Notes (Signed)
PHARMACY - TOTAL PARENTERAL NUTRITION CONSULT NOTE   Indication: intolerance to enteral feeding  Patient Measurements: Height: 5\' 6"  (167.6 cm) Weight: 60.5 kg (133 lb 6.1 oz) IBW/kg (Calculated) : 63.8 TPN AdjBW (KG): 60.1 Body mass index is 21.53 kg/m.  Assessment:  65 yo M presents on 10/28 for eval of rectal cancer. S/P LAR and diverting ileostomy. Pharmacy consulted to start TPN 11/5. Stopped TPN on 11/15 but had to restart on 11/17 as patient vomited as is unable to tolerate PO.  Glucose / Insulin: No Hx DM. CBGs were controlled off SSI however AM glucose readings had been increasing over past several days. Restarted SSI on 12/26. CBGs better controlled yesterday. No SSI used yesterday. Electrolytes: Na dropped to 141 today (none in TPN since 12/24), K improving to 3.8. Cl improving CorrCa 10.1. Renal: SCr improving. Peaked at 2.08.(baseline 0.6). BUN down to 55. Hepatic: Albumin remains low; LFTs and Tbili WNL TG: WNL Intake / Output: Net negative over last week or more. - UOP decreased to 2000 mL/24 hrs, no NG output charted yesterday,drain 40 mL, stool 975 mL IVF: NS stopped on 12/24. GI Imaging: 11/4 CT abdomen shows fluid collection, SBO, bladder thickening  11/17 KUB: Dilated small bowel loops are noted concerning for distal SBO 11/19 CT:  SBO, increase in size of gas and fluid abscess formation  11/28 CTAP: mild decrease in gas/gjuid in presacral space, no change in SBO 12/5 CTAP:  severe dilatation of small bowel loops in the abdomen concerning for ileus/obstruction 12/7 GGF enema: shows anastomotic leak, controlled with drain 12/24 CT: continued pelvic abscess and new RLQ fluid collection GI Surgeries / Procedures:  10/28: LAR/Diverting ileostomy  11/30: Successful CT-guided presacral pelvic abscess drain placement 12/14: Small bowel resection x 2 with end colostomy, LOA; NGT placed for decompression   Central access: Implanted Port TPN start date: 11/5 >> 11/15.  Resumed 11/17>>  Nutritional Goals:  Cyclic TPN:  9833 mL provides 110 g of protein and 1984 kcals per day  RD Assessment: Estimated Needs Total Energy Estimated Needs: 1950-2150 Total Protein Estimated Needs: 100-110g Total Fluid Estimated Needs: 2L/day  Current Nutrition:  NPO TPN transitioned to 12 hour cyclic (82/50)  Plan:  At 5397: Continue cyclic TPN. Infuse 2,040 mL over 12 hrs : 93 mL/hr x 1 hr, then 185 mL/hr x 10 hrs, then 93 mL/hr x 1 hr. Electrolytes in TPN Na 25 mEq/L K 40 mEq/L Ca 58mEq/L Mg 87mEq/L Phos 67mmol/L Cl:Ac max acetate Add standard MVI and trace elements to TPN Continue sensitive SSI with 3 custom time checks per day based on cyclic TPN. If CBGs remain controlled with no SSI use will consider discontinuing checks again TPN labs on Mon/Thurs  Elenor Quinones, PharmD, BCPS, BCIDP Clinical Pharmacist 10/15/2021 7:54 AM

## 2021-10-15 NOTE — Progress Notes (Signed)
Patient ID: Phillip Brooks, male   DOB: Aug 12, 1956, 65 y.o.   MRN: [id number] [facility] Surgery Progress Note  14 Days Post-Op  Subjective: CC-  Feels the same as yesterday. Worried about IR placing a new drain and having increased pain because he is already uncomfortable from the TG drain on the left.  Tolerated intermittent NG clamping trials. Ileostomy with 975cc out, thin bilious contents in bag this morning. Patient confirms full code status.  Objective: Vital signs in last 24 hours: Temp:  [97.6 F (36.4 C)-98.4 F (36.9 C)] 97.6 F (36.4 C) (12/28 0509) Pulse Rate:  [94-101] 101 (12/28 0509) Resp:  [16-18] 18 (12/28 0509) BP: (112-121)/(72-85) 121/85 (12/28 0509) SpO2:  [98 %-100 %] 98 % (12/28 0509) Last BM Date: 10/14/21  Intake/Output from previous day: 12/27 0701 - 12/28 0700 In: 2219.8 [P.O.:300; I.V.:1362.7; NG/GT:30; IV Piggyback:527.1] Out: 3015 [Urine:2000; Drains:40; Stool:975] Intake/Output this shift: No intake/output data recorded.  PE: General: pleasant, WD, thin male who is laying in bed in NAD Lungs: Respiratory effort nonlabored on room air Abd: soft, mild left sided abdominal TTP without rebound or guarding, ND, ileostomy protruding some with thin green bilious fluid in pouch, colostomy viable with nothing in pouch this AM, L transgluteal drain with purulent appearing material  GU: clear yellow urine  Lab Results:  Recent Labs    10/13/21 0623  WBC 15.6*  HGB 7.5*  HCT 25.8*  PLT 572*   BMET Recent Labs    10/14/21 0424 10/15/21 0426  NA 152* 141  K 3.4* 3.8  CL 114* 111  CO2 27 21*  GLUCOSE 164* 129*  BUN 64* 55*  CREATININE 1.44* 0.97  CALCIUM 9.3 8.8*   PT/INR No results for input(s): LABPROT, INR in the last 72 hours. CMP     Component Value Date/Time   NA 141 10/15/2021 0426   K 3.8 10/15/2021 0426   CL 111 10/15/2021 0426   CO2 21 (L) 10/15/2021 0426   GLUCOSE 129 (H) 10/15/2021 0426   BUN 55 (H) 10/15/2021 0426    CREATININE 0.97 10/15/2021 0426   CREATININE 0.79 06/09/2021 1020   CALCIUM 8.8 (L) 10/15/2021 0426   PROT 7.4 10/13/2021 0623   ALBUMIN 2.2 (L) 10/13/2021 0623   AST 19 10/13/2021 0623   AST 11 (L) 06/09/2021 1020   ALT 42 10/13/2021 0623   ALT 5 06/09/2021 1020   ALKPHOS 67 10/13/2021 0623   BILITOT 0.4 10/13/2021 0623   BILITOT 0.3 06/09/2021 1020   GFRNONAA >60 10/15/2021 0426   GFRNONAA >60 06/09/2021 1020   GFRAA >60 06/29/2020 0125   Lipase     Component Value Date/Time   LIPASE 36 10/05/2020 0805       Studies/Results: No results found.  Anti-infectives: Anti-infectives (From admission, onward)    Start     Dose/Rate Route Frequency Ordered Stop   10/12/21 1000  piperacillin-tazobactam (ZOSYN) IVPB 3.375 g        3.375 g 12.5 mL/hr over 240 Minutes Intravenous Every 8 hours 10/12/21 0727     09/22/2021 2200  cefoTEtan (CEFOTAN) 1 g in sodium chloride 0.9 % 100 mL IVPB  Status:  Discontinued        1 g 200 mL/hr over 30 Minutes Intravenous Every 12 hours 09/30/2021 1608 10/09/2021 1643   09/25/2021 2200  cefoTEtan (CEFOTAN) 2 g in sodium chloride 0.9 % 100 mL IVPB        2 g 200 mL/hr over 30 Minutes Intravenous  Once 09/30/2021 1644 09/28/2021 2212   10/17/2021 1158  sodium chloride 0.9 % with cefoTEtan (CEFOTAN) ADS Med       Note to Pharmacy: Georgena Spurling W: cabinet override      09/27/2021 1158 10/02/21 0014   09/17/21 1730  piperacillin-tazobactam (ZOSYN) IVPB 3.375 g        3.375 g 12.5 mL/hr over 240 Minutes Intravenous Every 8 hours 09/17/21 1631 09/22/21 1001   09/08/21 1400  cefTRIAXone (ROCEPHIN) 2 g in sodium chloride 0.9 % 100 mL IVPB       Note to Pharmacy: Pharmacy may adjust dosing strength / duration / interval for maximal efficacy   2 g 200 mL/hr over 30 Minutes Intravenous Every 24 hours 09/08/21 1307 09/14/21 1431   08/26/21 1000  metroNIDAZOLE (FLAGYL) IVPB 500 mg  Status:  Discontinued        500 mg 100 mL/hr over 60 Minutes Intravenous Every 12  hours 08/26/21 0830 08/26/21 0910   08/26/21 1000  piperacillin-tazobactam (ZOSYN) IVPB 3.375 g  Status:  Discontinued        3.375 g 12.5 mL/hr over 240 Minutes Intravenous Every 8 hours 08/26/21 0910 09/01/21 0819   08/25/21 1030  metroNIDAZOLE (FLAGYL) tablet 500 mg  Status:  Discontinued        500 mg Per Tube Every 12 hours 08/25/21 0935 08/26/21 0830   08/25/21 1000  cefTRIAXone (ROCEPHIN) 2 g in sodium chloride 0.9 % 100 mL IVPB  Status:  Discontinued       Note to Pharmacy: Pharmacy may adjust dosing strength / duration / interval for maximal efficacy   2 g 200 mL/hr over 30 Minutes Intravenous Every 24 hours 08/25/21 0823 08/26/21 0910   08/25/21 1000  metroNIDAZOLE (FLAGYL) tablet 500 mg  Status:  Discontinued        500 mg Oral Every 12 hours 08/25/21 0837 08/25/21 0935   08/14/2021 2000  cefoTEtan (CEFOTAN) 2 g in sodium chloride 0.9 % 100 mL IVPB        2 g 200 mL/hr over 30 Minutes Intravenous Every 12 hours 08/14/2021 1647 08/01/2021 2046   08/14/2021 0600  cefoTEtan (CEFOTAN) 2 g in sodium chloride 0.9 % 100 mL IVPB        2 g 200 mL/hr over 30 Minutes Intravenous On call to O.R. 07/25/2021 5170 07/23/2021 0806        Assessment/Plan Severe malnutrition, prolonged ileus - on TPN    Dehydration: cont MIV    pSBO: due to pelvic adhesions from pelvic abscess. IR unable to place decompressive PEG tube.  Pt unable to tolerate a PO diet, taken to OR 12/14 for ex lap.  Several loops of small bowel adherent to completely dehisced anastomosis.  SBR x2 with endo colostomy, pathology c/w carcinomatosis.  NG output decreasing, continue clamping trials today (4 hrs clamped and 2 hrs LIWS)   Anemia: repeat CBC in AM   Anastomotic dehiscence  - JP drainage catheter appears to be in appropriate position, Zosyn d/c'd 11/14.  wbc normal.  - Rpt CT 11/20 shows increased abscess size with drain in correct position.   restarted drain flushes.  No sign of systemic infection.  Discussed with IR on  11/21.  No further targets available for additional drainage.   7 day course of abx repeated.   - CT 11/28 fluid collection more organized but slightly smaller, IR placed TG drain 11/30, Zosyn x5 days -12/8: GGF enema shows anastomotic leak, controlled with drain - 12/14 complete  dehischence of anastomosis found on ex lap.  Colostomy created - 12/22 Abd JP removed -CT 12/24 shows continued pelvic abscess and new RLQ fluid collection.  Restarted Zosyn 12/25 and have IR eval for drainage - patient refused yesterday but agreeable to drain placement tomorrow 12/29   ID: currently zosyn 12/25>> for new fluid collection seen on CT   Physical deconditioning: Ambulate in hall TID, PT/OT when patient agreeable   Narcotic dependence: dilaudid prn for pain, have tried to wean slowly; continue IV tylenol today and scheduled IV robaxin. Will see if palliative team could also help with pain management   Urinary retention: foley replaced on Mon 11/1.  foley out again 11/7, failed voiding trial 11/9 with elevated PVR.  Foley replaced, will need urology f/u as outpatient.  Pt appeared to have a neurogenic bladder during ex lap. Follow up with uro as outpatient with foley for urodynamic tests    Elevated Cr - improving   Pt with forming carcinomatous on recent exlap and new liver lesions concerning for mets on most recent CT.  Dr Benay Spice and pt aware.  Nothing further to do until recovered from GI standpoint.    Dispo: pt has a temporary housing plan in place at discharge in [place] with his Aunt.  TOC team consulted and working with housing authority to assist with long term housing. Await return in bowel function. Palliative consult for assistance with goals of care    LOS: 61 days    Wellington Hampshire, [facility] Surgery 10/15/2021, 8:47 AM Please see Amion for pager number during day hours 7:00am-4:30pm

## 2021-10-15 NOTE — Progress Notes (Signed)
Pt refused to have NG tubed clamped out of fear of future nausea. He states that he knows it will make him sick.

## 2021-10-16 ENCOUNTER — Inpatient Hospital Stay (HOSPITAL_COMMUNITY): Payer: Medicare HMO

## 2021-10-16 ENCOUNTER — Other Ambulatory Visit (HOSPITAL_COMMUNITY): Payer: Self-pay

## 2021-10-16 DIAGNOSIS — R634 Abnormal weight loss: Secondary | ICD-10-CM

## 2021-10-16 DIAGNOSIS — Z933 Colostomy status: Secondary | ICD-10-CM | POA: Diagnosis not present

## 2021-10-16 DIAGNOSIS — G629 Polyneuropathy, unspecified: Secondary | ICD-10-CM

## 2021-10-16 DIAGNOSIS — Z932 Ileostomy status: Secondary | ICD-10-CM | POA: Diagnosis not present

## 2021-10-16 DIAGNOSIS — C20 Malignant neoplasm of rectum: Principal | ICD-10-CM

## 2021-10-16 LAB — CBC
HCT: 24.8 % — ABNORMAL LOW (ref 39.0–52.0)
Hemoglobin: 7 g/dL — ABNORMAL LOW (ref 13.0–17.0)
MCH: 24.9 pg — ABNORMAL LOW (ref 26.0–34.0)
MCHC: 28.2 g/dL — ABNORMAL LOW (ref 30.0–36.0)
MCV: 88.3 fL (ref 80.0–100.0)
Platelets: 603 10*3/uL — ABNORMAL HIGH (ref 150–400)
RBC: 2.81 MIL/uL — ABNORMAL LOW (ref 4.22–5.81)
RDW: 16.8 % — ABNORMAL HIGH (ref 11.5–15.5)
WBC: 10.7 10*3/uL — ABNORMAL HIGH (ref 4.0–10.5)
nRBC: 0.7 % — ABNORMAL HIGH (ref 0.0–0.2)

## 2021-10-16 LAB — COMPREHENSIVE METABOLIC PANEL
ALT: 22 U/L (ref 0–44)
AST: 17 U/L (ref 15–41)
Albumin: 2.5 g/dL — ABNORMAL LOW (ref 3.5–5.0)
Alkaline Phosphatase: 64 U/L (ref 38–126)
Anion gap: 11 (ref 5–15)
BUN: 49 mg/dL — ABNORMAL HIGH (ref 8–23)
CO2: 25 mmol/L (ref 22–32)
Calcium: 8.8 mg/dL — ABNORMAL LOW (ref 8.9–10.3)
Chloride: 106 mmol/L (ref 98–111)
Creatinine, Ser: 1.06 mg/dL (ref 0.61–1.24)
GFR, Estimated: >60 mL/min (ref >60)
Glucose, Bld: 84 mg/dL (ref 70–99)
Potassium: 4 mmol/L (ref 3.5–5.1)
Sodium: 142 mmol/L (ref 135–145)
Total Bilirubin: 0.3 mg/dL (ref 0.3–1.2)
Total Protein: 7.7 g/dL (ref 6.5–8.1)

## 2021-10-16 LAB — GLUCOSE, CAPILLARY
Glucose-Capillary: 89 mg/dL (ref 70–99)
Glucose-Capillary: 104 mg/dL — ABNORMAL HIGH (ref 70–99)
Glucose-Capillary: 236 mg/dL — ABNORMAL HIGH (ref 70–99)

## 2021-10-16 LAB — PREPARE RBC (CROSSMATCH)

## 2021-10-16 LAB — MAGNESIUM: Magnesium: 2 mg/dL (ref 1.7–2.4)

## 2021-10-16 LAB — PHOSPHORUS: Phosphorus: 2.7 mg/dL (ref 2.5–4.6)

## 2021-10-16 MED ORDER — ACETAMINOPHEN 10 MG/ML IV SOLN
1000.0000 mg | Freq: Four times a day (QID) | INTRAVENOUS | Status: AC
  Administered 2021-10-16 – 2021-10-17 (×3): 1000 mg via INTRAVENOUS
  Filled 2021-10-16 (×4): qty 100

## 2021-10-16 MED ORDER — SODIUM CHLORIDE 0.9% IV SOLUTION
Freq: Once | INTRAVENOUS | Status: AC
  Administered 2021-10-16: 16:00:00 50 mL via INTRAVENOUS

## 2021-10-16 MED ORDER — FENTANYL 25 MCG/HR TD PT72
1.0000 | MEDICATED_PATCH | TRANSDERMAL | Status: DC
  Administered 2021-10-16 – 2021-10-19 (×2): 1 via TRANSDERMAL
  Filled 2021-10-16 (×2): qty 1

## 2021-10-16 MED ORDER — IOHEXOL 350 MG/ML SOLN
80.0000 mL | Freq: Once | INTRAVENOUS | Status: AC | PRN
  Administered 2021-10-16: 20:00:00 80 mL via INTRAVENOUS

## 2021-10-16 MED ORDER — TRAVASOL 10 % IV SOLN
INTRAVENOUS | Status: AC
  Filled 2021-10-16: qty 1101.6

## 2021-10-16 MED ORDER — HYDROMORPHONE HCL 1 MG/ML IJ SOLN
0.5000 mg | INTRAMUSCULAR | Status: DC | PRN
  Administered 2021-10-16 – 2021-10-17 (×6): 0.5 mg via INTRAVENOUS
  Filled 2021-10-16 (×6): qty 0.5

## 2021-10-16 MED ORDER — SODIUM CHLORIDE 0.9 % IV SOLN
150.0000 ug/h | INTRAVENOUS | Status: DC
  Administered 2021-10-16 – 2021-10-18 (×5): 50 ug/h via INTRAVENOUS
  Administered 2021-10-18 – 2021-10-20 (×8): 100 ug/h via INTRAVENOUS
  Administered 2021-10-20 – 2021-10-21 (×3): 150 ug/h via INTRAVENOUS
  Filled 2021-10-16 (×22): qty 1

## 2021-10-16 MED ORDER — HYDROMORPHONE HCL 1 MG/ML IJ SOLN: 0.5000 mg | INTRAMUSCULAR | Status: DC | PRN

## 2021-10-16 MED ORDER — MIRTAZAPINE 15 MG PO TBDP
15.0000 mg | ORAL_TABLET | Freq: Every day | ORAL | Status: DC
  Administered 2021-10-16 – 2021-10-17 (×2): 15 mg via ORAL
  Filled 2021-10-16 (×3): qty 1

## 2021-10-16 NOTE — Progress Notes (Signed)
Patient ID: Phillip Brooks, male   DOB: 03-01-1956, 65 y.o.   MRN: [id number] [facility] Surgery Progress Note  15 Days Post-Op  Subjective: CC-  Did not tolerate clamping trials yesterday. States that he vomited once. 550cc out from ostomy. 500cc from NG. States that he has not discussed IR procedure with his family yet so he does not want to do it today. I offered to talk with them on the phone but he declines. He states that he will talk with them today, and do procedure tomorrow.   Objective: Vital signs in last 24 hours: Temp:  [97.5 F (36.4 C)-97.8 F (36.6 C)] 97.8 F (36.6 C) (12/29 0522) Pulse Rate:  [93-99] 95 (12/29 0522) Resp:  [17-18] 17 (12/29 0522) BP: (112-121)/(74-84) 118/84 (12/29 0522) SpO2:  [100 %] 100 % (12/29 0522) Last BM Date: 10/15/21  Intake/Output from previous day: 12/28 0701 - 12/29 0700 In: 4111.3 [P.O.:270; I.V.:2074.3; NG/GT:60; IV Piggyback:1707] Out: 3080 [Urine:1900; Emesis/NG output:600; Drains:30; Stool:550] Intake/Output this shift: Total I/O In: -  Out: 22 [Urine:550]  PE: General: pleasant, WD, thin male who is laying in bed in NAD Lungs: Respiratory effort nonlabored on room air Abd: soft, mild left sided abdominal TTP without rebound or guarding, mild distension, ileostomy protruding some with thin green bilious fluid in pouch, colostomy viable with nothing in pouch this AM, L transgluteal drain with purulent appearing material  GU: clear yellow urine  Lab Results:  Recent Labs    10/16/21 0555  WBC 10.7*  HGB 7.0*  HCT 24.8*  PLT 603*   BMET Recent Labs    10/15/21 0426 10/16/21 0555  NA 141 142  K 3.8 4.0  CL 111 106  CO2 21* 25  GLUCOSE 129* 84  BUN 55* 49*  CREATININE 0.97 1.06  CALCIUM 8.8* 8.8*   PT/INR No results for input(s): LABPROT, INR in the last 72 hours. CMP     Component Value Date/Time   NA 142 10/16/2021 0555   K 4.0 10/16/2021 0555   CL 106 10/16/2021 0555   CO2 25 10/16/2021 0555    GLUCOSE 84 10/16/2021 0555   BUN 49 (H) 10/16/2021 0555   CREATININE 1.06 10/16/2021 0555   CREATININE 0.79 06/09/2021 1020   CALCIUM 8.8 (L) 10/16/2021 0555   PROT 7.7 10/16/2021 0555   ALBUMIN 2.5 (L) 10/16/2021 0555   AST 17 10/16/2021 0555   AST 11 (L) 06/09/2021 1020   ALT 22 10/16/2021 0555   ALT 5 06/09/2021 1020   ALKPHOS 64 10/16/2021 0555   BILITOT 0.3 10/16/2021 0555   BILITOT 0.3 06/09/2021 1020   GFRNONAA >60 10/16/2021 0555   GFRNONAA >60 06/09/2021 1020   GFRAA >60 06/29/2020 0125   Lipase     Component Value Date/Time   LIPASE 36 10/05/2020 0805       Studies/Results: No results found.  Anti-infectives: Anti-infectives (From admission, onward)    Start     Dose/Rate Route Frequency Ordered Stop   10/12/21 1000  piperacillin-tazobactam (ZOSYN) IVPB 3.375 g        3.375 g 12.5 mL/hr over 240 Minutes Intravenous Every 8 hours 10/12/21 0727     09/27/2021 2200  cefoTEtan (CEFOTAN) 1 g in sodium chloride 0.9 % 100 mL IVPB  Status:  Discontinued        1 g 200 mL/hr over 30 Minutes Intravenous Every 12 hours 09/25/2021 1608 10/16/2021 1643   10/13/2021 2200  cefoTEtan (CEFOTAN) 2 g in sodium chloride 0.9 %  100 mL IVPB        2 g 200 mL/hr over 30 Minutes Intravenous  Once 10/17/2021 1644 09/30/2021 2212   10/14/2021 1158  sodium chloride 0.9 % with cefoTEtan (CEFOTAN) ADS Med       Note to Pharmacy: Georgena Spurling W: cabinet override      10/08/2021 1158 10/02/21 0014   09/17/21 1730  piperacillin-tazobactam (ZOSYN) IVPB 3.375 g        3.375 g 12.5 mL/hr over 240 Minutes Intravenous Every 8 hours 09/17/21 1631 09/22/21 1001   09/08/21 1400  cefTRIAXone (ROCEPHIN) 2 g in sodium chloride 0.9 % 100 mL IVPB       Note to Pharmacy: Pharmacy may adjust dosing strength / duration / interval for maximal efficacy   2 g 200 mL/hr over 30 Minutes Intravenous Every 24 hours 09/08/21 1307 09/14/21 1431   08/26/21 1000  metroNIDAZOLE (FLAGYL) IVPB 500 mg  Status:  Discontinued         500 mg 100 mL/hr over 60 Minutes Intravenous Every 12 hours 08/26/21 0830 08/26/21 0910   08/26/21 1000  piperacillin-tazobactam (ZOSYN) IVPB 3.375 g  Status:  Discontinued        3.375 g 12.5 mL/hr over 240 Minutes Intravenous Every 8 hours 08/26/21 0910 09/01/21 0819   08/25/21 1030  metroNIDAZOLE (FLAGYL) tablet 500 mg  Status:  Discontinued        500 mg Per Tube Every 12 hours 08/25/21 0935 08/26/21 0830   08/25/21 1000  cefTRIAXone (ROCEPHIN) 2 g in sodium chloride 0.9 % 100 mL IVPB  Status:  Discontinued       Note to Pharmacy: Pharmacy may adjust dosing strength / duration / interval for maximal efficacy   2 g 200 mL/hr over 30 Minutes Intravenous Every 24 hours 08/25/21 0823 08/26/21 0910   08/25/21 1000  metroNIDAZOLE (FLAGYL) tablet 500 mg  Status:  Discontinued        500 mg Oral Every 12 hours 08/25/21 0837 08/25/21 0935   08/12/2021 2000  cefoTEtan (CEFOTAN) 2 g in sodium chloride 0.9 % 100 mL IVPB        2 g 200 mL/hr over 30 Minutes Intravenous Every 12 hours 08/06/2021 1647 07/30/2021 2046   07/21/2021 0600  cefoTEtan (CEFOTAN) 2 g in sodium chloride 0.9 % 100 mL IVPB        2 g 200 mL/hr over 30 Minutes Intravenous On call to O.R. 08/04/2021 5366 08/12/2021 0806        Assessment/Plan Severe malnutrition, prolonged ileus - on TPN    Dehydration: cont MIV    pSBO: due to pelvic adhesions from pelvic abscess. IR unable to place decompressive PEG tube.  Pt unable to tolerate a PO diet, taken to OR 12/14 for ex lap.  Several loops of small bowel adherent to completely dehisced anastomosis.  SBR x2 with endo colostomy, pathology c/w carcinomatosis.  Did not tolerate NG clamping trials, return NG to LIWS 12/29   Anemia: Hgb 7, will give 1 unit PRBCs 12/29   Anastomotic dehiscence  - JP drainage catheter appears to be in appropriate position, Zosyn d/c'd 11/14.  wbc normal.  - Rpt CT 11/20 shows increased abscess size with drain in correct position.   restarted drain flushes.  No  sign of systemic infection.  Discussed with IR on 11/21.  No further targets available for additional drainage.   7 day course of abx repeated.   - CT 11/28 fluid collection more organized but slightly smaller,  IR placed TG drain 11/30, Zosyn x5 days -12/8: GGF enema shows anastomotic leak, controlled with drain - 12/14 complete dehischence of anastomosis found on ex lap.  Colostomy created - 12/22 Abd JP removed -CT 12/24 shows continued pelvic abscess and new RLQ fluid collection.  Restarted Zosyn 12/25 and have IR eval for drainage - patient refuses again today but states he will have this done tomorrow. Discussed with IR, they are going to repeat CT scan today.    ID: currently zosyn 12/25>> for new fluid collection seen on CT   Physical deconditioning: Ambulate in hall TID, PT/OT when patient agreeable   Narcotic dependence: dilaudid prn for pain; continue IV tylenol today and scheduled IV robaxin. Will see if palliative team could also help with pain management   Urinary retention: foley replaced on Mon 11/1.  foley out again 11/7, failed voiding trial 11/9 with elevated PVR.  Foley replaced, will need urology f/u as outpatient.  Pt appeared to have a neurogenic bladder during ex lap. Follow up with uro as outpatient with foley for urodynamic tests    Elevated Cr - improving   Pt with forming carcinomatous on recent exlap and new liver lesions concerning for mets on most recent CT.  Dr Benay Spice and pt aware.  Nothing further to do until recovered from GI standpoint. Dr. Benay Spice to follow up with patient today   Dispo: pt has a temporary housing plan in place at discharge in [place] with his Aunt.  TOC team consulted and working with housing authority to assist with long term housing. Await return in bowel function. Palliative consult for assistance with goals of care    LOS: 62 days    Wellington Hampshire, [facility] Surgery 10/16/2021, 9:23 AM Please see Amion for pager  number during day hours 7:00am-4:30pm

## 2021-10-16 NOTE — Progress Notes (Signed)
PHARMACY - TOTAL PARENTERAL NUTRITION CONSULT NOTE   Indication: intolerance to enteral feeding  Patient Measurements: Height: 5\' 6"  (167.6 cm) Weight: 60.5 kg (133 lb 6.1 oz) IBW/kg (Calculated) : 63.8 TPN AdjBW (KG): 60.1 Body mass index is 21.53 kg/m.  Assessment:  65 yo M presents on 10/28 for eval of rectal cancer. S/P LAR and diverting ileostomy. Pharmacy consulted to start TPN 11/5. Stopped TPN on 11/15 but had to restart on 11/17 as patient vomited as is unable to tolerate PO.  Glucose / Insulin: No Hx DM.  - on sensitive SSI TID (uesd 1 unit in 24 hrs) - cbgs: 84-138  Electrolytes: Na stable at 142 (none in TPN since 12/24) - CorrCa 10; other lytes wnl Renal: SCr 1.06. BUN down 49 Hepatic: Albumin remains low; LFTs and Tbili WNL TG: WNL Intake / Output: + 1031 ml - UOP 1900 ml, NG output 600 ml; drain 30 mL, stool 550 mL IVF: NS stopped on 12/24. GI Imaging: 11/4 CT abdomen shows fluid collection, SBO, bladder thickening  11/17 KUB: Dilated small bowel loops are noted concerning for distal SBO 11/19 CT:  SBO, increase in size of gas and fluid abscess formation  11/28 CTAP: mild decrease in gas/gjuid in presacral space, no change in SBO 12/5 CTAP:  severe dilatation of small bowel loops in the abdomen concerning for ileus/obstruction 12/7 GGF enema: shows anastomotic leak, controlled with drain 12/24 CT: continued pelvic abscess and new RLQ fluid collection GI Surgeries / Procedures:  10/28: LAR/Diverting ileostomy  11/30: Successful CT-guided presacral pelvic abscess drain placement 12/14: Small bowel resection x 2 with end colostomy, LOA; NGT placed for decompression   Central access: Implanted Port TPN start date: 11/5 >> 11/15. Resumed 11/17>>  Nutritional Goals:  Cyclic TPN:  6203 mL provides 110 g of protein and 1984 kcals per day  RD Assessment: Estimated Needs Total Energy Estimated Needs: 1950-2150 Total Protein Estimated Needs: 100-110g Total Fluid  Estimated Needs: 2L/day  Current Nutrition:  NPO TPN transitioned to 12 hour cyclic (55/97)  Plan:   At 4163: Continue cyclic TPN. Infuse 2,040 mL over 12 hrs : 93 mL/hr x 1 hr, then 185 mL/hr x 10 hrs, then 93 mL/hr x 1 hr. Electrolytes in TPN Na 25 mEq/L K 40 mEq/L Ca 34mEq/L Mg 2.5 mEq/L Phos 15 mmol/L Cl:Ac max acetate Add standard MVI and trace elements to TPN Continue sensitive SSI with 3 custom time checks per day based on cyclic TPN.  TPN labs on Mon/Thurs Bmet, phos, and mag on 12/30  Dia Sitter, PharmD, BCPS 10/16/2021 9:32 AM

## 2021-10-16 NOTE — Progress Notes (Signed)
Received orders to advance NGT to 15 cm. Patient made aware and educated importance of doing it as soon as possible, patient refused advancement to be done tonight. Patient upset because of pain and and wanted to get some rest tonight and verbalized want it done tomorrow around lunch instead. Explained that this RN will give him prn medication as soon as able to give next dose. See MAR. Gross, MD on call also made aware of situation. Will continue to monitor patient with remainder of shift.

## 2021-10-16 NOTE — Progress Notes (Signed)
IP PROGRESS NOTE  Subjective:   Phillip Brooks has remained hospitalized since undergoing a robotic assisted low anterior resection and diverting ileostomy on 07/20/2021.  Objective: Vital signs in last 24 hours: Blood pressure 118/84, pulse 95, temperature 97.8 F (36.6 C), resp. rate 17, height _0  (1.676 m), weight 133 lb 6.1 oz (60.5 kg), SpO2 100 %.  Intake/Output from previous day: 12/28 0701 - 12/29 0700 In: 4111.3 [P.O.:270; I.V.:2074.3; NG/GT:60; IV Piggyback:1707] Out: 3080 [Urine:1900; Emesis/NG output:600; Drains:30; Stool:550]  Physical Exam:  HEENT: No thrush Lungs: Decreased breath sounds at the left compared to the right chest, no respiratory distress Cardiac: Regular rate and rhythm Abdomen: No hepatosplenomegaly, right abdomen ileostomy with liquid stool, left lower quadrant colostomy Extremities: No leg edema Musculoskeletal: Left pelvic drain Neurologic: Alert and oriented, follows commands  Portacath/PICC-without erythema  Lab Results: Recent Labs    10/16/21 0555  WBC 10.7*  HGB 7.0*  HCT 24.8*  PLT 603*    BMET Recent Labs    10/15/21 0426 10/16/21 0555  NA 141 142  K 3.8 4.0  CL 111 106  CO2 21* 25  GLUCOSE 129* 84  BUN 55* 49*  CREATININE 0.97 1.06  CALCIUM 8.8* 8.8*    Lab Results  Component Value Date   CEA1 1.94 07/28/2021   CEA 1.76 07/28/2021    Studies/Results: No results found.  Medications: I have reviewed the patient's current medications.  Assessment/Plan: Rectal cancer Colonoscopy 12/06/2020-partially obstructing mass beginning at 7 cm from the anal verge-biopsy adenocarcinoma CTs 12/10/2020-large circumferential fungating mass at the rectosigmoid junction beginning at 9 cm from the anal verge, enlarged perirectal lymph nodes, prominent bilateral iliac and pelvic sidewall nodes-new compared to a CT from 2018, no evidence of metastatic disease to the chest, pulmonary nodules at the right apex-nonspecific MRI pelvis  12/24/2020-T4b, N2; distance from tumor to the internal anal sphincter 7.3 cm; findings of potential contained perforation with tract seen potentially extending through the tumor into the mesorectum. Plan for total neoadjuvant therapy Cycle 1 FOLFOX 01/07/2021 Cycle 2 FOLFOX 01/22/2021 Cycle 3 FOLFOX 02/06/2021 Cycle 4 FOLFOX 02/19/2021 (oxaliplatin dose reduced to 65 mg per metered squared due to mild neutropenia) Cycle 5 FOLFOX 03/05/2021, Udenyca Cycle 6 FOLFOX 03/19/2021, Udenyca Cycle 7 FOLFOX 04/02/2021, Udenyca Cycle 8 FOLFOX 04/16/2021, Udenyca Radiation/Xeloda 05/05/2021-06/12/2021 MRI pelvis 06/26/2021-tumor at 9 cm from the anal verge, decreased tumor bulk, extra rectal involvement at the left seminal vesicle and anterior peritoneal reflection, tumor involves the fascia at the anterior sacrum and mesorectum T4b.  Decrease in size of pelvic lymph nodes , High inferior mesenteric vein node not evaluated Low anterior resection and diverting ileostomy 07/28/2021, focally perforated at the left anterior mid rectum, no evidence of metastatic disease, moderately differentiated adenocarcinoma, 7 cm, tumor invades perirectal soft tissue, 1.5 mm radial margin, no lymphovascular perineural invasion, 0/15 lymph nodes, absent treatment effect, score 3, ypT3ypN0, no loss of mismatch repair protein expression, MSI stable, pathology upgraded to ypN1c based on mesenteric tumor deposits on small bowel resection 09/25/2021 Prolonged postoperative small bowel obstruction and ileum inability to tolerate p.o. 10/07/2021-lysis of adhesions, small bowel resection x2 with end colostomy, anastomotic dehiscence causing dense pelvic adhesions with small bowel obstruction, small bowel resection-benign enteric and colonic tissue with colitis/enteritis and dense fibrous adhesions, 2 mesenteric tumor deposits, 2 benign lymph nodes,ypN1c CT 10/11/2021-new low-density liver lesions concerning for metastases, dilated small bowel  Pain  secondary #1-improved Weight loss-improved Port-A-Cath placement, Dr. 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  He underwent a low anterior resection and diverting ileostomy in October.  He has remained hospitalized with a prolonged ileus/bowel obstruction.  He was found to have dehiscence of the rectal anastomosis earlier this month and underwent lysis of adhesions and small bowel resections with creation of an end colostomy.  He was found to have mesenteric tumor deposits on the small bowel resection specimen.  A CT of the abdomen and pelvis on 10/11/2021 reveals new low-density liver lesions.  I reviewed the pathology from the low anterior resection and small bowel resections.  I reviewed the 10/11/2021 CT images.  Phillip Brooks appears to have metastatic rectal cancer.  I discussed the prognosis with him.  He understands no therapy will be curative.  He would like to consider a trial of salvage systemic therapy if he recovers from the small bowel obstruction/ileus and abscesses.  It is unclear whether the persistent small bowel obstruction is related to adhesions or carcinomatosis.  I will recommend hospice care if the small bowel obstruction does not improve.  Recommendations:  Continue postoperative care per the surgical service Please call oncology as needed.  I will check on him 10/20/2021. I will continue discussions with Phillip Brooks regarding the prognosis and indication for treatment of the  rectal cancer   LOS: 62 days   Betsy Coder, MD   10/16/2021, 7:16 AM

## 2021-10-17 LAB — TYPE AND SCREEN
ABO/RH(D): A NEG
Antibody Screen: NEGATIVE
Unit division: 0

## 2021-10-17 LAB — CBC
HCT: 26.9 % — ABNORMAL LOW (ref 39.0–52.0)
Hemoglobin: 8.2 g/dL — ABNORMAL LOW (ref 13.0–17.0)
MCH: 26.6 pg (ref 26.0–34.0)
MCHC: 30.5 g/dL (ref 30.0–36.0)
MCV: 87.3 fL (ref 80.0–100.0)
Platelets: 530 10*3/uL — ABNORMAL HIGH (ref 150–400)
RBC: 3.08 MIL/uL — ABNORMAL LOW (ref 4.22–5.81)
RDW: 16.4 % — ABNORMAL HIGH (ref 11.5–15.5)
WBC: 10.5 10*3/uL (ref 4.0–10.5)
nRBC: 0 % (ref 0.0–0.2)

## 2021-10-17 LAB — BASIC METABOLIC PANEL
Anion gap: 7 (ref 5–15)
BUN: 45 mg/dL — ABNORMAL HIGH (ref 8–23)
CO2: 24 mmol/L (ref 22–32)
Calcium: 8.5 mg/dL — ABNORMAL LOW (ref 8.9–10.3)
Chloride: 104 mmol/L (ref 98–111)
Creatinine, Ser: 1.07 mg/dL (ref 0.61–1.24)
GFR, Estimated: >60 mL/min (ref >60)
Glucose, Bld: 133 mg/dL — ABNORMAL HIGH (ref 70–99)
Potassium: 3.7 mmol/L (ref 3.5–5.1)
Sodium: 135 mmol/L (ref 135–145)

## 2021-10-17 LAB — MAGNESIUM: Magnesium: 1.9 mg/dL (ref 1.7–2.4)

## 2021-10-17 LAB — GLUCOSE, CAPILLARY
Glucose-Capillary: 87 mg/dL (ref 70–99)
Glucose-Capillary: 100 mg/dL — ABNORMAL HIGH (ref 70–99)
Glucose-Capillary: 141 mg/dL — ABNORMAL HIGH (ref 70–99)

## 2021-10-17 LAB — PHOSPHORUS: Phosphorus: 4 mg/dL (ref 2.5–4.6)

## 2021-10-17 LAB — BPAM RBC
Blood Product Expiration Date: 202301022359
ISSUE DATE / TIME: 202212291524
Unit Type and Rh: 600

## 2021-10-17 MED ORDER — TRAVASOL 10 % IV SOLN
INTRAVENOUS | Status: AC
  Filled 2021-10-17: qty 1101.6

## 2021-10-17 MED ORDER — HYDROMORPHONE HCL 1 MG/ML IJ SOLN
1.0000 mg | INTRAMUSCULAR | Status: DC | PRN
  Administered 2021-10-17 – 2021-10-18 (×9): 1 mg via INTRAVENOUS
  Filled 2021-10-17 (×9): qty 1

## 2021-10-17 NOTE — Progress Notes (Signed)
16 Days Post-Op   Subjective/Chief Complaint: Pt is down about his cancer and how long he has been here. No specific complaints   Objective: Vital signs in last 24 hours: Temp:  [97.3 F (36.3 C)-98.5 F (36.9 C)] 97.7 F (36.5 C) (12/30 0549) Pulse Rate:  [84-94] 89 (12/30 0549) Resp:  [14-18] 18 (12/30 0549) BP: (104-127)/(68-83) 108/75 (12/30 0549) SpO2:  [100 %] 100 % (12/30 0549) Last BM Date: 10/16/21  Intake/Output from previous day: 12/29 0701 - 12/30 0700 In: 4143.9 [P.O.:510; I.V.:2279.2; Blood:707.7; IV Piggyback:647.1] Out: 7858 [Urine:3650; Emesis/NG output:300; Drains:65; Stool:900] Intake/Output this shift: No intake/output data recorded.  General appearance: alert and cooperative Resp: clear to auscultation bilaterally Cardio: regular rate and rhythm GI: soft, mild tenderness. Ostomy pink and productive  Lab Results:  Recent Labs    10/16/21 0555 10/17/21 0621  WBC 10.7* 10.5  HGB 7.0* 8.2*  HCT 24.8* 26.9*  PLT 603* 530*   BMET Recent Labs    10/16/21 0555 10/17/21 0625  NA 142 135  K 4.0 3.7  CL 106 104  CO2 25 24  GLUCOSE 84 133*  BUN 49* 45*  CREATININE 1.06 1.07  CALCIUM 8.8* 8.5*   PT/INR No results for input(s): LABPROT, INR in the last 72 hours. ABG No results for input(s): PHART, HCO3 in the last 72 hours.  Invalid input(s): PCO2, PO2  Studies/Results: CT ABDOMEN PELVIS W CONTRAST  Result Date: 10/16/2021 CLINICAL DATA:  Follow-up intra-abdominal abscess. History of rectal cancer with low anterior resection and diverting ileostomy. EXAM: CT ABDOMEN AND PELVIS WITH CONTRAST TECHNIQUE: Multidetector CT imaging of the abdomen and pelvis was performed using the standard protocol following bolus administration of intravenous contrast. CONTRAST:  67mL OMNIPAQUE IOHEXOL 350 MG/ML SOLN COMPARISON:  CT abdomen pelvis with IV contrast 10/11/2021, CT without contrast 09/22/2021. FINDINGS: Lower chest: There is posterior atelectasis both  lung bases. Small left pleural effusion noted on December 24 has resolved since. No right pleural effusion. Rest of the lung bases are clear. The cardiac size is normal. NGT enters the stomach but the proximal side hole only 3 cm below the hiatus and could be advanced further in 10 cm for better positioning. Hepatobiliary: Gallbladder and bile ducts are unremarkable. 4 presumed liver metastases are again noted. There is a 1.7 cm right hepatic lesion posterior to the IVC (20/93 of series 2) measuring 1.4 cm 5 days ago. In the lower lateral right lobe there is a 1.6 cm lesion on axial image 24 which was previously 1.2 cm. In the left lobe lateral segment on this same image there is a 1.8 cm lesion which was previously 1.6 cm. In the caudal right lobe on axial image 33 there is a 1.4 cm low-density lesion which was previously 1.4 cm. No further or new hepatic lesions are seen. Pancreas: No mass enhancement , ductal dilatation or surrounding edema. Spleen: Normal. Adrenals/Urinary Tract: There is no adrenal mass. No renal cortical mass, renal calculus or hydronephrosis. Bladder is catheterized , partially fluid distended. Small amount of air in the anterior bladder. Mild bladder thickening or changes of underdistention. Stomach/Bowel: Stomach is mostly decompressed. NGT positioning as described above. The duodenum is decompressed. Beginning in the jejunum there is diffuse small-bowel dilatation up to 4.8 cm which was noted previously on both prior studies, only slightly improved from December 5 and not changed at all since December 24. A discrete transition is not identified but small bowel loops in the right lower abdomen extending to the  right lower quadrant ileostomy are decompressed. There is contrast in the colon with left lower quadrant descending colostomy and rectosigmoid resection again shown. Small bowel anastomoses are again noted in the lower abdomen but they appear patent. Left posterior approach pigtail  drainage catheter today is only partially within the presacral air-fluid collection described previously, with rim enhancement. Current measurements 5.4 x 3.5 cm, previously 5.5 x 4.3 cm. Only a small portion of the pigtail rests within the collection on the current exam, was previously entirely within it. Repositioning may be indicated. Vascular/Lymphatic: Trace aortic calcific plaque and mild iliac calcifications. No AAA. Mild aortic tortuosity. There are no appreciable enlarged lymph nodes. Reproductive: Prostatomegaly, transverse axis 4.6 cm. Multiple pelvic sidewall phleboliths. Other: Small umbilical and inguinal fat hernias. Central pelvic gas collection centered posteriorly is again noted on image 60/93 of series 2, currently measuring 5.8 x 1.5 cm, previously 6.2 x 1.8 cm, beginning just above the presacral abscess. Additional loculated air-fluid collection just deep to the right rectus sheath in the right lower abdomen today measures 4.4 x 1.2 cm, with rim enhancement (axial 47/93), previously 9.4 x 2.1 cm. Overlying the left low anterior pelvic rectus sheath, there is new demonstration of a small air pocket on axial 65/93 measuring 2.3 x 0.8 cm. Musculoskeletal: No destructive osseous or lytic lesions. IMPRESSION: 1. 3 of 4 hepatic masses, presumed metastases, are slightly larger than 5 days ago but no new metastasis is appreciated. 2. Interval resolution of the prior left pleural effusion. Persistent posterior basal atelectasis. 3. NGT enters the stomach but could be advanced 10 cm for better placement. The stomach and duodenum are decompressed. 4. Persisting fairly diffuse small bowel dilatation except in the right lower quadrant extending up to the right lower quadrant ileostomy. An exact transitional segment could not be identified but findings are probably due to partial obstruction related to occult adhesions. 5. Very little change in the presacral abscess today but the pigtail segment of the  drainage catheter today is only partially within the collection and repositioning may be warranted. 6. Central pelvic gas collection just above this is slightly smaller today as is a rim enhancing fluid collection deep to the right rectus sheath in the right lower abdomen, with new demonstration of a small subcutaneous air pocket overlying the low anterior pelvic rectus sheath on the left. 7. Bladder catheterized and mildly thickened or exaggerated from nondistention, with mild fluid distension despite catheterization. Correlate clinically for catheter dysfunction. 8. In all other respects no further changes are seen. Electronically Signed   By: Telford Nab M.D.   On: 10/16/2021 21:19    Anti-infectives: Anti-infectives (From admission, onward)    Start     Dose/Rate Route Frequency Ordered Stop   10/12/21 1000  piperacillin-tazobactam (ZOSYN) IVPB 3.375 g        3.375 g 12.5 mL/hr over 240 Minutes Intravenous Every 8 hours 10/12/21 0727     10/13/2021 2200  cefoTEtan (CEFOTAN) 1 g in sodium chloride 0.9 % 100 mL IVPB  Status:  Discontinued        1 g 200 mL/hr over 30 Minutes Intravenous Every 12 hours 10/12/2021 1608 10/09/2021 1643   09/23/2021 2200  cefoTEtan (CEFOTAN) 2 g in sodium chloride 0.9 % 100 mL IVPB        2 g 200 mL/hr over 30 Minutes Intravenous  Once 09/19/2021 1644 09/25/2021 2212   10/15/2021 1158  sodium chloride 0.9 % with cefoTEtan (CEFOTAN) ADS Med  Note to Pharmacy: Lesia Hausen: cabinet override      10/17/2021 1158 10/02/21 0014   09/17/21 1730  piperacillin-tazobactam (ZOSYN) IVPB 3.375 g        3.375 g 12.5 mL/hr over 240 Minutes Intravenous Every 8 hours 09/17/21 1631 09/22/21 1001   09/08/21 1400  cefTRIAXone (ROCEPHIN) 2 g in sodium chloride 0.9 % 100 mL IVPB       Note to Pharmacy: Pharmacy may adjust dosing strength / duration / interval for maximal efficacy   2 g 200 mL/hr over 30 Minutes Intravenous Every 24 hours 09/08/21 1307 09/14/21 1431   08/26/21 1000   metroNIDAZOLE (FLAGYL) IVPB 500 mg  Status:  Discontinued        500 mg 100 mL/hr over 60 Minutes Intravenous Every 12 hours 08/26/21 0830 08/26/21 0910   08/26/21 1000  piperacillin-tazobactam (ZOSYN) IVPB 3.375 g  Status:  Discontinued        3.375 g 12.5 mL/hr over 240 Minutes Intravenous Every 8 hours 08/26/21 0910 09/01/21 0819   08/25/21 1030  metroNIDAZOLE (FLAGYL) tablet 500 mg  Status:  Discontinued        500 mg Per Tube Every 12 hours 08/25/21 0935 08/26/21 0830   08/25/21 1000  cefTRIAXone (ROCEPHIN) 2 g in sodium chloride 0.9 % 100 mL IVPB  Status:  Discontinued       Note to Pharmacy: Pharmacy may adjust dosing strength / duration / interval for maximal efficacy   2 g 200 mL/hr over 30 Minutes Intravenous Every 24 hours 08/25/21 0823 08/26/21 0910   08/25/21 1000  metroNIDAZOLE (FLAGYL) tablet 500 mg  Status:  Discontinued        500 mg Oral Every 12 hours 08/25/21 0837 08/25/21 0935   08/09/2021 2000  cefoTEtan (CEFOTAN) 2 g in sodium chloride 0.9 % 100 mL IVPB        2 g 200 mL/hr over 30 Minutes Intravenous Every 12 hours 07/25/2021 1647 08/11/2021 2046   07/27/2021 0600  cefoTEtan (CEFOTAN) 2 g in sodium chloride 0.9 % 100 mL IVPB        2 g 200 mL/hr over 30 Minutes Intravenous On call to O.R. 08/05/2021 0626 08/18/2021 0806       Assessment/Plan: s/p Procedure(s): LYSIS OF ADHESION (N/A) SMALL BOWEL RESECTION X 2 WITH END COLOSTOMY (N/A) Continue ng and bowel rest today. Will try clamping trial again tomorrow Severe malnutrition, prolonged ileus - on TPN    Dehydration: cont MIV    pSBO: due to pelvic adhesions from pelvic abscess. IR unable to place decompressive PEG tube.  Pt unable to tolerate a PO diet, taken to OR 12/14 for ex lap.  Several loops of small bowel adherent to completely dehisced anastomosis.  SBR x2 with endo colostomy, pathology c/w carcinomatosis.  Did not tolerate NG clamping trials, return NG to LIWS 12/29   Anemia: Hgb 7, will give 1 unit PRBCs  12/29   Anastomotic dehiscence  - JP drainage catheter appears to be in appropriate position, Zosyn d/c'd 11/14.  wbc normal.  - Rpt CT 11/20 shows increased abscess size with drain in correct position.   restarted drain flushes.  No sign of systemic infection.  Discussed with IR on 11/21.  No further targets available for additional drainage.   7 day course of abx repeated.   - CT 11/28 fluid collection more organized but slightly smaller, IR placed TG drain 11/30, Zosyn x5 days -12/8: GGF enema shows anastomotic leak, controlled with drain -  12/14 complete dehischence of anastomosis found on ex lap.  Colostomy created - 12/22 Abd JP removed -CT 12/24 shows continued pelvic abscess and new RLQ fluid collection.  Restarted Zosyn 12/25 and have IR eval for drainage - patient refuses again today but states he will have this done tomorrow. Discussed with IR, they are going to repeat CT scan today.    ID: currently zosyn 12/25>> for new fluid collection seen on CT   Physical deconditioning: Ambulate in hall TID, PT/OT when patient agreeable   Narcotic dependence: dilaudid prn for pain; continue IV tylenol today and scheduled IV robaxin. Will see if palliative team could also help with pain management   Urinary retention: foley replaced on Mon 11/1.  foley out again 11/7, failed voiding trial 11/9 with elevated PVR.  Foley replaced, will need urology f/u as outpatient.  Pt appeared to have a neurogenic bladder during ex lap. Follow up with uro as outpatient with foley for urodynamic tests    Elevated Cr - improving   Pt with forming carcinomatous on recent exlap and new liver lesions concerning for mets on most recent CT.  Dr Benay Spice and pt aware.  Nothing further to do until recovered from GI standpoint. Dr. Benay Spice to follow up with patient today   Dispo: pt has a temporary housing plan in place at discharge in [place] with his Aunt.  TOC team consulted and working with housing authority to  assist with long term housing. Await return in bowel function. Palliative consult for assistance with goals of care   LOS: 63 days    Autumn Messing III 10/17/2021

## 2021-10-17 NOTE — Progress Notes (Addendum)
°   10/17/21 1300  Clinical Encounter Type  Visited With Patient;Family  Visit Type Initial;Psychological support;Spiritual support  Referral From Nurse;Physician  Consult/Referral To Chaplain  Spiritual Encounters  Spiritual Needs Sacred text;Prayer;Ritual;Emotional  Stress Factors  Patient Stress Factors Family relationships;Health changes;Loss of control  Family Stress Factors None identified   Chaplain made visit for Spiritual care Referral.  Phillip Brooks was alert and eager to talk with chaplain, as he discussed his faith journey.  He described I detail a dream that he had that depicted the Crucifixion of Jesus Christ - saying " His hands were nailed to the corss and his feet were too. If He went through  that I can go through this."  He said ti would be God's timing to take him and he is confident that the [name] will care for him.  Chaplain provided spiritual care to him and his niece as well.    No spiritual distress was exhibited from [name] at this time.  Will remain available as needed.   Rev. Mammie Lorenzo

## 2021-10-17 NOTE — Progress Notes (Signed)
Chaplain followed up on consult and was present as Dr. Hilma Favors from [facility] was present in the room and reviewing plan and goals of care.  Provided support to Lieutenant and his cousin as they processed the information.  Chaplain Janne Napoleon, [place] PAger, [phone]

## 2021-10-17 NOTE — Progress Notes (Signed)
Patient was referred to IR for RLQ drain placement and was brought down to [facility] CT but he refused.   Repeat CT A/P from yesterday reviewed by Dr. Vernard Gambles, no safe window for RLQ drain placement. Will cancel the IR eval order.   L TG drain can be pulled back 1-2 cm if it stops draining per Dr. Vernard Gambles.  As it has been having good output, will continue drainage with current position.    IR will continue to follow remotely.   Armando Gang Kelina Beauchamp PA-C 10/17/2021 11:56 AM

## 2021-10-17 NOTE — Progress Notes (Signed)
Mobility Specialist - Progress Note    10/17/21 1600  Mobility  Activity Dangled on edge of bed  Level of Assistance Standby assist, set-up cues, supervision of patient - no hands on  Assistive Device None  Minutes Sat in Chair 2 minutes (2 minute sit at EOB)  Mobility Sit up in bed/chair position for meals  Mobility Response Tolerated fair  Mobility performed by Mobility specialist  $Mobility charge 1 Mobility   Pt was encouraged to mobilize upon entry. Pt managed to move side to side in bed before agreeing to sit EOB. Once up, pt c/o of pain and stated he still felt fatigued from getting up earlier in the day. Pt sat up for ~2 minutes prior to requesting to return to bed. At EOS, pt was left in bed with call bell at side.   [facility] Specialist Acute Rehabilitation Services Phone: [phone] 10/17/21, 4:10 PM

## 2021-10-17 NOTE — Progress Notes (Signed)
PHARMACY - TOTAL PARENTERAL NUTRITION CONSULT NOTE   Indication: intolerance to enteral feeding  Patient Measurements: Height: 5\' 6"  (167.6 cm) Weight: 60.5 kg (133 lb 6.1 oz) IBW/kg (Calculated) : 63.8 TPN AdjBW (KG): 60.1 Body mass index is 21.53 kg/m.  Assessment:  65 yo M presents on 10/28 for eval of rectal cancer. S/P LAR and diverting ileostomy. Pharmacy consulted to start TPN 11/5. Stopped TPN on 11/15 but had to restart on 11/17 as patient vomited as is unable to tolerate PO.  Glucose / Insulin: No Hx DM.  - on sensitive SSI TID (uesd 3 units in 24 hrs) - cbgs: 87-236 Electrolytes: Na now at lower end of normal range (added back to TPN 12/28) - CorrCa 9.7; other lytes wnl Renal: SCr 1.07. BUN down 45 Hepatic: Albumin remains low; LFTs and Tbili WNL TG: WNL Intake / Output: - 771 ml - UOP 3650 ml, NG output 300 ml; drain 55 mL, stool 900 mL IVF: NS stopped on 12/24. GI Imaging: 11/4 CT abdomen shows fluid collection, SBO, bladder thickening  11/17 KUB: Dilated small bowel loops are noted concerning for distal SBO 11/19 CT:  SBO, increase in size of gas and fluid abscess formation  11/28 CTAP: mild decrease in gas/gjuid in presacral space, no change in SBO 12/5 CTAP:  severe dilatation of small bowel loops in the abdomen concerning for ileus/obstruction 12/7 GGF enema: shows anastomotic leak, controlled with drain 12/24 CT: continued pelvic abscess and new RLQ fluid collection GI Surgeries / Procedures:  10/28: LAR/Diverting ileostomy  11/30: Successful CT-guided presacral pelvic abscess drain placement 12/14: Small bowel resection x 2 with end colostomy, LOA; NGT placed for decompression   Central access: Implanted Port TPN start date: 11/5 >> 11/15. Resumed 11/17>>  Nutritional Goals:  Cyclic TPN:  4970 mL provides 110 g of protein and 1984 kcals per day  RD Assessment: Estimated Needs Total Energy Estimated Needs: 1950-2150 Total Protein Estimated Needs:  100-110g Total Fluid Estimated Needs: 2L/day  Current Nutrition:  NPO TPN transitioned to 12 hour cyclic (26/37)  Plan:   At 8588: Continue cyclic TPN. Infuse 2,040 mL over 12 hrs : 93 mL/hr x 1 hr, then 185 mL/hr x 10 hrs, then 93 mL/hr x 1 hr. Electrolytes in TPN Na 35 mEq/L K 40 mEq/L Ca 51mEq/L Mg 2.5 mEq/L Phos 10 mmol/L Cl:Ac max acetate Add standard MVI and trace elements to TPN Continue sensitive SSI with 3 custom time checks per day based on cyclic TPN.  TPN labs on Mon/Thurs Bmet, phos, and mag on 12/31  Ulice Dash, PharmD  10/17/2021 8:51 AM

## 2021-10-17 NOTE — Consult Note (Signed)
Palliative Care Consultation Note Reason for Consult: Pain and Symptom Management, GOC  [name] is a 65 yo man with metastatic rectal cancer diagnosed 11/2020 is now hospital day 62 following low anterior resection with ileostomy. His post-operative course has been complicated with dehiscence of anastomosis, prolonged ileus and abscess formation- all felt to be related to carcinomatosis and cancer progression intra-abdominally. He now on TPN and requires an NG tube for decompression continuously. His prognosis for recovery is extremely poor given the multitude of complications and evidence of cancer progression. Palliative care consultation requested for pain and symptom management as well as goals of care.  Persistent Ileus and Probable Malignant Obstruction. Unable to place decompression PEG due to cancer and infection-has NG in place. On TPN. Persistent vomiting with clamping trials and high output from ileostomy. Recommend starting Octreotide, will titrate based on NG output and symptoms. Compazine for Nausea, can consider adding PM olanzapine for both sleep and nausea management if needed. May benefit from a steroid, but will hold administration given the multiple intraabdominal abscesses that he is dealing with.  Pain, Cancer related and post-surgical Will need aggressive pain control-he endorses significant suffering. We dicussed impact on bowel function as one of his concerns but I don't think opioids are the reason he has a prolonged ileus. Start 25 mcg Fentanyl patch for better baseline consistent pain control. Increase hydromorphone to 1mg  q3 prn-we discussed steps involved in getting his pain under better control. Goals of Care Patient has established and trusted relationship w/ Dr. Benay Spice who spoke with him this AM. Patient not fully processing the prognostic information shared with him and it is hard for him to consider the severity of the road ahead. I think it will be very hard for  him to electively stop or not accept any treatment offered.Offering tx implies benefit. He does share that he knows this isn't curable and that he believes God will determine his path. He also endorses that it is becoming more difficult mentally and emotionally for him to endure additional hospital days. I offered him a comfort path- he is considering his options and will wait until Dr. Benay Spice returns next week before making any decisions. Recommend clear time limited trials of medical interventions provided and what outcomes are expected. Begin to consider at what point this becomes medically futile if the goal is to restore his functional status and QOL and be able to live outside of a hospital setting. Spiritual Care Consult placed for additional support.  Time:70 minutes Greater than 50%  of this time was spent counseling and coordinating care related to the above assessment and plan.  Lane Hacker, DO Palliative Medicine

## 2021-10-18 LAB — GLUCOSE, CAPILLARY
Glucose-Capillary: 100 mg/dL — ABNORMAL HIGH (ref 70–99)
Glucose-Capillary: 127 mg/dL — ABNORMAL HIGH (ref 70–99)
Glucose-Capillary: 93 mg/dL (ref 70–99)

## 2021-10-18 LAB — BASIC METABOLIC PANEL
Anion gap: 10 (ref 5–15)
BUN: 40 mg/dL — ABNORMAL HIGH (ref 8–23)
CO2: 23 mmol/L (ref 22–32)
Calcium: 8.8 mg/dL — ABNORMAL LOW (ref 8.9–10.3)
Chloride: 109 mmol/L (ref 98–111)
Creatinine, Ser: 0.83 mg/dL (ref 0.61–1.24)
GFR, Estimated: >60 mL/min (ref >60)
Glucose, Bld: 148 mg/dL — ABNORMAL HIGH (ref 70–99)
Potassium: 4.2 mmol/L (ref 3.5–5.1)
Sodium: 142 mmol/L (ref 135–145)

## 2021-10-18 LAB — PHOSPHORUS: Phosphorus: 2.6 mg/dL (ref 2.5–4.6)

## 2021-10-18 LAB — MAGNESIUM: Magnesium: 2 mg/dL (ref 1.7–2.4)

## 2021-10-18 MED ORDER — TRAVASOL 10 % IV SOLN
INTRAVENOUS | Status: AC
  Filled 2021-10-18: qty 1101.6

## 2021-10-18 MED ORDER — MIRTAZAPINE 30 MG PO TBDP
30.0000 mg | ORAL_TABLET | Freq: Every day | ORAL | Status: DC
  Administered 2021-10-18 – 2021-11-04 (×14): 30 mg via ORAL
  Filled 2021-10-18 (×19): qty 1

## 2021-10-18 MED ORDER — SODIUM CHLORIDE 0.9 % IV SOLN
8.0000 mg | Freq: Four times a day (QID) | INTRAVENOUS | Status: DC | PRN
  Filled 2021-10-18: qty 4

## 2021-10-18 MED ORDER — METOCLOPRAMIDE HCL 5 MG/ML IJ SOLN
5.0000 mg | Freq: Three times a day (TID) | INTRAMUSCULAR | Status: DC | PRN
Start: 2021-10-18 — End: 2021-10-20

## 2021-10-18 MED ORDER — ONDANSETRON HCL 4 MG/2ML IJ SOLN
4.0000 mg | Freq: Four times a day (QID) | INTRAMUSCULAR | Status: DC | PRN
  Administered 2021-10-23 – 2021-11-05 (×18): 4 mg via INTRAVENOUS
  Filled 2021-10-18 (×18): qty 2

## 2021-10-18 MED ORDER — PROCHLORPERAZINE EDISYLATE 10 MG/2ML IJ SOLN
5.0000 mg | INTRAMUSCULAR | Status: DC | PRN
  Administered 2021-10-31 (×2): 5 mg via INTRAVENOUS
  Administered 2021-11-01 (×2): 10 mg via INTRAVENOUS
  Administered 2021-11-03: 5 mg via INTRAVENOUS
  Administered 2021-11-03 – 2021-11-06 (×2): 10 mg via INTRAVENOUS
  Filled 2021-10-18 (×8): qty 2

## 2021-10-18 MED ORDER — ONDANSETRON 4 MG PO TBDP
4.0000 mg | ORAL_TABLET | Freq: Four times a day (QID) | ORAL | Status: DC | PRN
Start: 2021-10-18 — End: 2021-11-06
  Filled 2021-10-18: qty 2

## 2021-10-18 MED ORDER — LIP MEDEX EX OINT
1.0000 "application " | TOPICAL_OINTMENT | Freq: Two times a day (BID) | CUTANEOUS | Status: DC
  Administered 2021-10-18: 75 via TOPICAL
  Administered 2021-10-19 – 2021-11-06 (×38): 1 via TOPICAL
  Filled 2021-10-18 (×6): qty 7

## 2021-10-18 MED ORDER — HYDROMORPHONE HCL 1 MG/ML IJ SOLN
1.0000 mg | INTRAMUSCULAR | Status: DC | PRN
  Administered 2021-10-18 – 2021-10-19 (×11): 1 mg via INTRAVENOUS
  Administered 2021-10-19: 2 mg via INTRAVENOUS
  Administered 2021-10-20 – 2021-10-24 (×38): 1 mg via INTRAVENOUS
  Filled 2021-10-18 (×52): qty 1

## 2021-10-18 NOTE — Progress Notes (Signed)
PHARMACY - TOTAL PARENTERAL NUTRITION CONSULT NOTE   Indication: intolerance to enteral feeding  Patient Measurements: Height: 5\' 6"  (167.6 cm) Weight: 60.5 kg (133 lb 6.1 oz) IBW/kg (Calculated) : 63.8 TPN AdjBW (KG): 60.1 Body mass index is 21.53 kg/m.  Assessment:  65 yo M presents on 10/28 for eval of rectal cancer. S/P LAR and diverting ileostomy. Pharmacy consulted to start TPN 11/5. Stopped TPN on 11/15 but had to restart on 11/17 as patient vomited as is unable to tolerate PO.  Glucose / Insulin: No Hx DM.  - on sensitive SSI TID (uesd 1 unit in 24 hrs) - cbgs: 93-148 Electrolytes: Na now WNL(added back to TPN 12/28) - CorrCa 10; other lytes wnl Renal: SCr 1.07. BUN down 45 Hepatic: 12/29: Albumin remains low; LFTs and Tbili WNL TG: 12/26: WNL Intake / Output: - 1537 ml - UOP 3100 ml, NG output 600  ml; drain 25 mL, stool 300 mL IVF: NS stopped on 12/24. GI Imaging: 11/4 CT abdomen shows fluid collection, SBO, bladder thickening  11/17 KUB: Dilated small bowel loops are noted concerning for distal SBO 11/19 CT:  SBO, increase in size of gas and fluid abscess formation  11/28 CTAP: mild decrease in gas/gjuid in presacral space, no change in SBO 12/5 CTAP:  severe dilatation of small bowel loops in the abdomen concerning for ileus/obstruction 12/7 GGF enema: shows anastomotic leak, controlled with drain 12/24 CT: continued pelvic abscess and new RLQ fluid collection GI Surgeries / Procedures:  10/28: LAR/Diverting ileostomy  11/30: Successful CT-guided presacral pelvic abscess drain placement 12/14: Small bowel resection x 2 with end colostomy, LOA; NGT placed for decompression   Central access: Implanted Port TPN start date: 11/5 >> 11/15. Resumed 11/17>>  Nutritional Goals:  Cyclic TPN:  3664 mL provides 110 g of protein and 1984 kcals per day  RD Assessment: Estimated Needs Total Energy Estimated Needs: 1950-2150 Total Protein Estimated Needs: 100-110g Total  Fluid Estimated Needs: 2L/day  Current Nutrition:  NPO TPN transitioned to 12 hour cyclic (40/34)  Plan:   At 7425: Continue cyclic TPN. Infuse 2,040 mL over 12 hrs : 93 mL/hr x 1 hr, then 185 mL/hr x 10 hrs, then 93 mL/hr x 1 hr. Electrolytes in TPN Na 30 mEq/L K 40 mEq/L Ca 64mEq/L Mg 2.5 mEq/L Phos 12.5 mmol/L Cl:Ac max acetate Add standard MVI and trace elements to TPN Continue sensitive SSI with 3 custom time checks per day based on cyclic TPN.  TPN labs on Mon/Thurs Bmet, phos, and mag on 1/1  Ulice Dash, PharmD  10/18/2021 10:30 AM

## 2021-10-18 NOTE — Progress Notes (Signed)
17 Days Post-Op   Subjective/Chief Complaint: Complains of some abd pain that comes and goes. unchanged   Objective: Vital signs in last 24 hours: Temp:  [98 F (36.7 C)-98.1 F (36.7 C)] 98 F (36.7 C) (12/31 0655) Pulse Rate:  [87-92] 92 (12/31 0655) Resp:  [17] 17 (12/31 0655) BP: (108-109)/(76-79) 108/79 (12/31 0655) SpO2:  [97 %-98 %] 98 % (12/31 0655) Last BM Date: 10/08/21  Intake/Output from previous day: 12/30 0701 - 12/31 0700 In: 2488 [P.O.:120; I.V.:2170.8; IV Piggyback:197.2] Out: 2725 [Urine:3100; Emesis/NG output:600; Drains:25; DGUYQ:034] Intake/Output this shift: No intake/output data recorded.  General appearance: alert and cooperative Resp: clear to auscultation bilaterally Cardio: regular rate and rhythm GI: soft, mild to moderate tenderness. Ostomy pink and productive  Lab Results:  Recent Labs    10/16/21 0555 10/17/21 0621  WBC 10.7* 10.5  HGB 7.0* 8.2*  HCT 24.8* 26.9*  PLT 603* 530*   BMET Recent Labs    10/17/21 0625 10/18/21 0502  NA 135 142  K 3.7 4.2  CL 104 109  CO2 24 23  GLUCOSE 133* 148*  BUN 45* 40*  CREATININE 1.07 0.83  CALCIUM 8.5* 8.8*   PT/INR No results for input(s): LABPROT, INR in the last 72 hours. ABG No results for input(s): PHART, HCO3 in the last 72 hours.  Invalid input(s): PCO2, PO2  Studies/Results: CT ABDOMEN PELVIS W CONTRAST  Result Date: 10/16/2021 CLINICAL DATA:  Follow-up intra-abdominal abscess. History of rectal cancer with low anterior resection and diverting ileostomy. EXAM: CT ABDOMEN AND PELVIS WITH CONTRAST TECHNIQUE: Multidetector CT imaging of the abdomen and pelvis was performed using the standard protocol following bolus administration of intravenous contrast. CONTRAST:  70mL OMNIPAQUE IOHEXOL 350 MG/ML SOLN COMPARISON:  CT abdomen pelvis with IV contrast 10/11/2021, CT without contrast 09/22/2021. FINDINGS: Lower chest: There is posterior atelectasis both lung bases. Small left pleural  effusion noted on December 24 has resolved since. No right pleural effusion. Rest of the lung bases are clear. The cardiac size is normal. NGT enters the stomach but the proximal side hole only 3 cm below the hiatus and could be advanced further in 10 cm for better positioning. Hepatobiliary: Gallbladder and bile ducts are unremarkable. 4 presumed liver metastases are again noted. There is a 1.7 cm right hepatic lesion posterior to the IVC (20/93 of series 2) measuring 1.4 cm 5 days ago. In the lower lateral right lobe there is a 1.6 cm lesion on axial image 24 which was previously 1.2 cm. In the left lobe lateral segment on this same image there is a 1.8 cm lesion which was previously 1.6 cm. In the caudal right lobe on axial image 33 there is a 1.4 cm low-density lesion which was previously 1.4 cm. No further or new hepatic lesions are seen. Pancreas: No mass enhancement , ductal dilatation or surrounding edema. Spleen: Normal. Adrenals/Urinary Tract: There is no adrenal mass. No renal cortical mass, renal calculus or hydronephrosis. Bladder is catheterized , partially fluid distended. Small amount of air in the anterior bladder. Mild bladder thickening or changes of underdistention. Stomach/Bowel: Stomach is mostly decompressed. NGT positioning as described above. The duodenum is decompressed. Beginning in the jejunum there is diffuse small-bowel dilatation up to 4.8 cm which was noted previously on both prior studies, only slightly improved from December 5 and not changed at all since December 24. A discrete transition is not identified but small bowel loops in the right lower abdomen extending to the right lower quadrant ileostomy  are decompressed. There is contrast in the colon with left lower quadrant descending colostomy and rectosigmoid resection again shown. Small bowel anastomoses are again noted in the lower abdomen but they appear patent. Left posterior approach pigtail drainage catheter today is only  partially within the presacral air-fluid collection described previously, with rim enhancement. Current measurements 5.4 x 3.5 cm, previously 5.5 x 4.3 cm. Only a small portion of the pigtail rests within the collection on the current exam, was previously entirely within it. Repositioning may be indicated. Vascular/Lymphatic: Trace aortic calcific plaque and mild iliac calcifications. No AAA. Mild aortic tortuosity. There are no appreciable enlarged lymph nodes. Reproductive: Prostatomegaly, transverse axis 4.6 cm. Multiple pelvic sidewall phleboliths. Other: Small umbilical and inguinal fat hernias. Central pelvic gas collection centered posteriorly is again noted on image 60/93 of series 2, currently measuring 5.8 x 1.5 cm, previously 6.2 x 1.8 cm, beginning just above the presacral abscess. Additional loculated air-fluid collection just deep to the right rectus sheath in the right lower abdomen today measures 4.4 x 1.2 cm, with rim enhancement (axial 47/93), previously 9.4 x 2.1 cm. Overlying the left low anterior pelvic rectus sheath, there is new demonstration of a small air pocket on axial 65/93 measuring 2.3 x 0.8 cm. Musculoskeletal: No destructive osseous or lytic lesions. IMPRESSION: 1. 3 of 4 hepatic masses, presumed metastases, are slightly larger than 5 days ago but no new metastasis is appreciated. 2. Interval resolution of the prior left pleural effusion. Persistent posterior basal atelectasis. 3. NGT enters the stomach but could be advanced 10 cm for better placement. The stomach and duodenum are decompressed. 4. Persisting fairly diffuse small bowel dilatation except in the right lower quadrant extending up to the right lower quadrant ileostomy. An exact transitional segment could not be identified but findings are probably due to partial obstruction related to occult adhesions. 5. Very little change in the presacral abscess today but the pigtail segment of the drainage catheter today is only  partially within the collection and repositioning may be warranted. 6. Central pelvic gas collection just above this is slightly smaller today as is a rim enhancing fluid collection deep to the right rectus sheath in the right lower abdomen, with new demonstration of a small subcutaneous air pocket overlying the low anterior pelvic rectus sheath on the left. 7. Bladder catheterized and mildly thickened or exaggerated from nondistention, with mild fluid distension despite catheterization. Correlate clinically for catheter dysfunction. 8. In all other respects no further changes are seen. Electronically Signed   By: Telford Nab M.D.   On: 10/16/2021 21:19    Anti-infectives: Anti-infectives (From admission, onward)    Start     Dose/Rate Route Frequency Ordered Stop   10/12/21 1000  piperacillin-tazobactam (ZOSYN) IVPB 3.375 g        3.375 g 12.5 mL/hr over 240 Minutes Intravenous Every 8 hours 10/12/21 0727     09/22/2021 2200  cefoTEtan (CEFOTAN) 1 g in sodium chloride 0.9 % 100 mL IVPB  Status:  Discontinued        1 g 200 mL/hr over 30 Minutes Intravenous Every 12 hours 10/03/2021 1608 09/22/2021 1643   09/29/2021 2200  cefoTEtan (CEFOTAN) 2 g in sodium chloride 0.9 % 100 mL IVPB        2 g 200 mL/hr over 30 Minutes Intravenous  Once 10/03/2021 1644 10/11/2021 2212   10/11/2021 1158  sodium chloride 0.9 % with cefoTEtan (CEFOTAN) ADS Med       Note to Pharmacy:  Georgena Spurling W: cabinet override      10/11/2021 1158 10/02/21 0014   09/17/21 1730  piperacillin-tazobactam (ZOSYN) IVPB 3.375 g        3.375 g 12.5 mL/hr over 240 Minutes Intravenous Every 8 hours 09/17/21 1631 09/22/21 1001   09/08/21 1400  cefTRIAXone (ROCEPHIN) 2 g in sodium chloride 0.9 % 100 mL IVPB       Note to Pharmacy: Pharmacy may adjust dosing strength / duration / interval for maximal efficacy   2 g 200 mL/hr over 30 Minutes Intravenous Every 24 hours 09/08/21 1307 09/14/21 1431   08/26/21 1000  metroNIDAZOLE (FLAGYL) IVPB 500 mg   Status:  Discontinued        500 mg 100 mL/hr over 60 Minutes Intravenous Every 12 hours 08/26/21 0830 08/26/21 0910   08/26/21 1000  piperacillin-tazobactam (ZOSYN) IVPB 3.375 g  Status:  Discontinued        3.375 g 12.5 mL/hr over 240 Minutes Intravenous Every 8 hours 08/26/21 0910 09/01/21 0819   08/25/21 1030  metroNIDAZOLE (FLAGYL) tablet 500 mg  Status:  Discontinued        500 mg Per Tube Every 12 hours 08/25/21 0935 08/26/21 0830   08/25/21 1000  cefTRIAXone (ROCEPHIN) 2 g in sodium chloride 0.9 % 100 mL IVPB  Status:  Discontinued       Note to Pharmacy: Pharmacy may adjust dosing strength / duration / interval for maximal efficacy   2 g 200 mL/hr over 30 Minutes Intravenous Every 24 hours 08/25/21 0823 08/26/21 0910   08/25/21 1000  metroNIDAZOLE (FLAGYL) tablet 500 mg  Status:  Discontinued        500 mg Oral Every 12 hours 08/25/21 0837 08/25/21 0935   08/11/2021 2000  cefoTEtan (CEFOTAN) 2 g in sodium chloride 0.9 % 100 mL IVPB        2 g 200 mL/hr over 30 Minutes Intravenous Every 12 hours 08/14/2021 1647 08/01/2021 2046   07/30/2021 0600  cefoTEtan (CEFOTAN) 2 g in sodium chloride 0.9 % 100 mL IVPB        2 g 200 mL/hr over 30 Minutes Intravenous On call to O.R. 08/01/2021 4401 07/21/2021 0806       Assessment/Plan: s/p Procedure(s): LYSIS OF ADHESION (N/A) SMALL BOWEL RESECTION X 2 WITH END COLOSTOMY (N/A) Will try clamping ng for a couple hours today Severe malnutrition, prolonged ileus - on TPN    Dehydration: cont MIV    pSBO: due to pelvic adhesions from pelvic abscess. IR unable to place decompressive PEG tube.  Pt unable to tolerate a PO diet, taken to OR 12/14 for ex lap.  Several loops of small bowel adherent to completely dehisced anastomosis.  SBR x2 with endo colostomy, pathology c/w carcinomatosis.  Did not tolerate NG clamping trials, return NG to [facility] 12/29. Will try again today   Anemia: Hgb 8.2   Anastomotic dehiscence  - JP drainage catheter appears to be in  appropriate position, Zosyn d/c'd 11/14.  wbc normal.  - Rpt CT 11/20 shows increased abscess size with drain in correct position.   restarted drain flushes.  No sign of systemic infection.  Discussed with IR on 11/21.  No further targets available for additional drainage.   7 day course of abx repeated.   - CT 11/28 fluid collection more organized but slightly smaller, IR placed TG drain 11/30, Zosyn x5 days -12/8: GGF enema shows anastomotic leak, controlled with drain - 12/14 complete dehischence of anastomosis found on ex  lap.  Colostomy created - 12/22 Abd JP removed -CT 12/24 shows continued pelvic abscess and new RLQ fluid collection.  Restarted Zosyn 12/25 and have IR eval for drainage - patient refuses again today but states he will have this done tomorrow. Discussed with IR, they are going to repeat CT scan today.    ID: currently zosyn 12/25>> for new fluid collection seen on CT   Physical deconditioning: Ambulate in hall TID, PT/OT when patient agreeable   Narcotic dependence: dilaudid prn for pain; continue IV tylenol today and scheduled IV robaxin. Will see if palliative team could also help with pain management   Urinary retention: foley replaced on Mon 11/1.  foley out again 11/7, failed voiding trial 11/9 with elevated PVR.  Foley replaced, will need urology f/u as outpatient.  Pt appeared to have a neurogenic bladder during ex lap. Follow up with uro as outpatient with foley for urodynamic tests    Elevated Cr - improving   Pt with forming carcinomatous on recent exlap and new liver lesions concerning for mets on most recent CT.  Dr Benay Spice and pt aware.  Nothing further to do until recovered from GI standpoint. Dr. Benay Spice to follow up with patient today   Dispo: pt has a temporary housing plan in place at discharge in [place] with his Aunt.  TOC team consulted and working with housing authority to assist with long term housing. Await return in bowel function. Palliative  consult for assistance with goals of care   LOS: 64 days    Phillip Brooks 10/18/2021

## 2021-10-18 NOTE — Progress Notes (Signed)
Clamped patients NG tube at 1030, patient called out at 1115 saying that he was vomiting. Patient vomited 100cc bilious fluid. RN gave nausea medicine and reconnected patient to low intermittent wall suction.

## 2021-10-18 NOTE — Progress Notes (Signed)
Palliative Care Progress Note  Patient appears mostly unchanged today. Phillip Brooks still does not have good control of Phillip pain -especially with OOB and ambulation. Yesterday I started him on a Fentanyl patch at 2mcg based on Phillip 24 hour usage of hydromorphone-will need 2-3 days to assess that dose and its effectiveness-encouraged staff to use prn hydromorphone for Phillip pain control as needed. Phillip Brooks reports being somewhat more fatigued today-I suspect some of that is related to existential suffering as Phillip Brooks has been considering the prognostic information given to him and struggling with a prolonged hospital stay and complication related to Phillip cancer and post-operative complications.  Phillip Brooks was at the bedside, she lives in [place] and is the closest relative geographically. Phillip Brooks has two Brooks who are involved in Phillip care. With Phillip permission I shared an update on Phillip condition with her and she was largely not aware of Phillip cancer progression and the severity of Phillip condition. She is requesting a family meeting next week to include surgery and oncology-they are trying to make plans about how to care for him when Phillip Brooks leaves the hospital. We discussed the challenges such as inability to place a decompression PEG, TPN dependence, persistent ileus/obstruction, the recent CT results and patient does not want to have another drain placed by IR for abscess.  Yesterday Phillip Brooks was also started on octreotide in an attempt to improve the large amounts of NG output-will tritrate this as tolerated- without return of some small bowel motility is will be nearly impossible to get the NG removed.  Recommendations:  Goals of care meeting asap next week w/ surgery and oncology to include Phillip sisters and Brooks. Continue Fentanyl 55mcg +hydromorphone IV PRN for pain.  ODT mirtazapine QHS, IV compazine for nausea Increase octreotide infusion to 13mcg  Phillip Hovland, DO Palliative Medicine  Time: 35 minutes Greater than 50%   of this time was spent counseling and coordinating care related to the above assessment and plan.

## 2021-10-18 NOTE — Progress Notes (Signed)
Attempted to advance patients NG tube at 1300 patient refusing but asking RN to advance NG when next dose of pain medication is due. RN again attempted to advance NG at 1440 after dose of pain medication given but patient continues to refuse asking that it be done tomorrow.

## 2021-10-19 ENCOUNTER — Inpatient Hospital Stay (HOSPITAL_COMMUNITY): Payer: Medicare HMO

## 2021-10-19 LAB — BASIC METABOLIC PANEL
Anion gap: 8 (ref 5–15)
BUN: 38 mg/dL — ABNORMAL HIGH (ref 8–23)
CO2: 23 mmol/L (ref 22–32)
Calcium: 8.3 mg/dL — ABNORMAL LOW (ref 8.9–10.3)
Chloride: 110 mmol/L (ref 98–111)
Creatinine, Ser: 0.7 mg/dL (ref 0.61–1.24)
GFR, Estimated: >60 mL/min (ref >60)
Glucose, Bld: 133 mg/dL — ABNORMAL HIGH (ref 70–99)
Potassium: 3.9 mmol/L (ref 3.5–5.1)
Sodium: 141 mmol/L (ref 135–145)

## 2021-10-19 LAB — PHOSPHORUS: Phosphorus: 2.4 mg/dL — ABNORMAL LOW (ref 2.5–4.6)

## 2021-10-19 LAB — GLUCOSE, CAPILLARY
Glucose-Capillary: 86 mg/dL (ref 70–99)
Glucose-Capillary: 118 mg/dL — ABNORMAL HIGH (ref 70–99)
Glucose-Capillary: 113 mg/dL — ABNORMAL HIGH (ref 70–99)
Glucose-Capillary: 96 mg/dL (ref 70–99)

## 2021-10-19 LAB — MAGNESIUM: Magnesium: 2 mg/dL (ref 1.7–2.4)

## 2021-10-19 MED ORDER — POTASSIUM PHOSPHATES 15 MMOLE/5ML IV SOLN
15.0000 mmol | Freq: Once | INTRAVENOUS | Status: AC
  Administered 2021-10-19: 15 mmol via INTRAVENOUS
  Filled 2021-10-19: qty 5

## 2021-10-19 MED ORDER — TRAVASOL 10 % IV SOLN
INTRAVENOUS | Status: AC
  Filled 2021-10-19: qty 1101.6

## 2021-10-19 NOTE — Progress Notes (Signed)
PHARMACY - TOTAL PARENTERAL NUTRITION CONSULT NOTE   Indication: intolerance to enteral feeding  Patient Measurements: Height: 5\' 6"  (167.6 cm) Weight: 58.8 kg (129 lb 10.1 oz) IBW/kg (Calculated) : 63.8 TPN AdjBW (KG): 60.1 Body mass index is 20.92 kg/m.  Assessment:  66 yo M presents on 10/28 for eval of rectal cancer. S/P LAR and diverting ileostomy. Pharmacy consulted to start TPN 11/5. Stopped TPN on 11/15 but had to restart on 11/17 as patient vomited as is unable to tolerate PO.  Glucose / Insulin: No Hx DM.  - on sensitive SSI TID (uesd 1 unit in 24 hrs) - cbgs: 86-133 Electrolytes: Na now WNL(added back to TPN 12/28) - CorrCa 9.7; other lytes wnl except phos slightly low at 2.4  Renal: SCr down 0.7 BUN down 38 Hepatic: 12/29: Albumin remains low; LFTs and Tbili WNL TG: 12/26: WNL Intake / Output: - 1315 ml - UOP 2500 ml, NG output 950 ml; drain 60 mL, stool 1100 mL IVF: NS stopped on 12/24. GI Imaging: 11/4 CT abdomen shows fluid collection, SBO, bladder thickening  11/17 KUB: Dilated small bowel loops are noted concerning for distal SBO 11/19 CT:  SBO, increase in size of gas and fluid abscess formation  11/28 CTAP: mild decrease in gas/gjuid in presacral space, no change in SBO 12/5 CTAP:  severe dilatation of small bowel loops in the abdomen concerning for ileus/obstruction 12/7 GGF enema: shows anastomotic leak, controlled with drain 12/24 CT: continued pelvic abscess and new RLQ fluid collection GI Surgeries / Procedures:  10/28: LAR/Diverting ileostomy  11/30: Successful CT-guided presacral pelvic abscess drain placement 12/14: Small bowel resection x 2 with end colostomy, LOA; NGT placed for decompression   Central access: Implanted Port TPN start date: 11/5 >> 11/15. Resumed 11/17>>  Nutritional Goals:  Cyclic TPN:  9758 mL provides 110 g of protein and 1984 kcals per day  RD Assessment: Estimated Needs Total Energy Estimated Needs: 1950-2150 Total  Protein Estimated Needs: 100-110g Total Fluid Estimated Needs: 2L/day  Current Nutrition:  NPO TPN transitioned to 12 hour cyclic (83/25)  Plan:  Now:  Kphos 15 mmol iv x 1  At 4982: Continue cyclic TPN. Infuse 2,040 mL over 12 hrs : 93 mL/hr x 1 hr, then 185 mL/hr x 10 hrs, then 93 mL/hr x 1 hr. Electrolytes in TPN Na 30 mEq/L Inc K 50 mEq/L Ca 33mEq/L Mg 2.5 mEq/L Inc Phos 15 mmol/L Cl:Ac max acetate Add standard MVI and trace elements to TPN Continue sensitive SSI with 3 custom time checks per day based on cyclic TPN.  TPN labs on Mon/Thurs  Ulice Dash, PharmD  10/19/2021 9:03 AM

## 2021-10-19 NOTE — Progress Notes (Signed)
18 Days Post-Op   Subjective/Chief Complaint: Complains of pain at his penis. Says he is not circumcised but looks like he is. No necrotic skin or rash. Did not tolerate ng clamping yesterday   Objective: Vital signs in last 24 hours: Temp:  [97.3 F (36.3 C)-98.9 F (37.2 C)] 97.3 F (36.3 C) (01/01 0605) Pulse Rate:  [85-98] 98 (01/01 0605) Resp:  [14-18] 18 (01/01 0605) BP: (113-123)/(68-84) 113/68 (01/01 0605) SpO2:  [100 %] 100 % (01/01 0605) Weight:  [58.8 kg] 58.8 kg (01/01 0605) Last BM Date: 10/08/21  Intake/Output from previous day: 12/31 0701 - 01/01 0700 In: 3294.5 [P.O.:360; I.V.:2678.1; IV Piggyback:256.4] Out: 4610 [Urine:2500; Emesis/NG output:950; Drains:60; Stool:1100] Intake/Output this shift: No intake/output data recorded.  General appearance: alert and cooperative Resp: clear to auscultation bilaterally Cardio: regular rate and rhythm GI: soft, minimal tenderness. Ostomy pink and productive  Lab Results:  Recent Labs    10/17/21 0621  WBC 10.5  HGB 8.2*  HCT 26.9*  PLT 530*   BMET Recent Labs    10/18/21 0502 10/19/21 0439  NA 142 141  K 4.2 3.9  CL 109 110  CO2 23 23  GLUCOSE 148* 133*  BUN 40* 38*  CREATININE 0.83 0.70  CALCIUM 8.8* 8.3*   PT/INR No results for input(s): LABPROT, INR in the last 72 hours. ABG No results for input(s): PHART, HCO3 in the last 72 hours.  Invalid input(s): PCO2, PO2  Studies/Results: No results found.  Anti-infectives: Anti-infectives (From admission, onward)    Start     Dose/Rate Route Frequency Ordered Stop   10/12/21 1000  piperacillin-tazobactam (ZOSYN) IVPB 3.375 g        3.375 g 12.5 mL/hr over 240 Minutes Intravenous Every 8 hours 10/12/21 0727 10/28/21 1148   10/02/2021 2200  cefoTEtan (CEFOTAN) 1 g in sodium chloride 0.9 % 100 mL IVPB  Status:  Discontinued        1 g 200 mL/hr over 30 Minutes Intravenous Every 12 hours 09/28/2021 1608 10/11/2021 1643   09/27/2021 2200  cefoTEtan  (CEFOTAN) 2 g in sodium chloride 0.9 % 100 mL IVPB        2 g 200 mL/hr over 30 Minutes Intravenous  Once 10/10/2021 1644 10/10/2021 2212   10/09/2021 1158  sodium chloride 0.9 % with cefoTEtan (CEFOTAN) ADS Med       Note to Pharmacy: Georgena Spurling W: cabinet override      10/05/2021 1158 10/02/21 0014   09/17/21 1730  piperacillin-tazobactam (ZOSYN) IVPB 3.375 g        3.375 g 12.5 mL/hr over 240 Minutes Intravenous Every 8 hours 09/17/21 1631 09/22/21 1001   09/08/21 1400  cefTRIAXone (ROCEPHIN) 2 g in sodium chloride 0.9 % 100 mL IVPB       Note to Pharmacy: Pharmacy may adjust dosing strength / duration / interval for maximal efficacy   2 g 200 mL/hr over 30 Minutes Intravenous Every 24 hours 09/08/21 1307 09/14/21 1431   08/26/21 1000  metroNIDAZOLE (FLAGYL) IVPB 500 mg  Status:  Discontinued        500 mg 100 mL/hr over 60 Minutes Intravenous Every 12 hours 08/26/21 0830 08/26/21 0910   08/26/21 1000  piperacillin-tazobactam (ZOSYN) IVPB 3.375 g  Status:  Discontinued        3.375 g 12.5 mL/hr over 240 Minutes Intravenous Every 8 hours 08/26/21 0910 09/01/21 0819   08/25/21 1030  metroNIDAZOLE (FLAGYL) tablet 500 mg  Status:  Discontinued  500 mg Per Tube Every 12 hours 08/25/21 0935 08/26/21 0830   08/25/21 1000  cefTRIAXone (ROCEPHIN) 2 g in sodium chloride 0.9 % 100 mL IVPB  Status:  Discontinued       Note to Pharmacy: Pharmacy may adjust dosing strength / duration / interval for maximal efficacy   2 g 200 mL/hr over 30 Minutes Intravenous Every 24 hours 08/25/21 0823 08/26/21 0910   08/25/21 1000  metroNIDAZOLE (FLAGYL) tablet 500 mg  Status:  Discontinued        500 mg Oral Every 12 hours 08/25/21 0837 08/25/21 0935   08/02/2021 2000  cefoTEtan (CEFOTAN) 2 g in sodium chloride 0.9 % 100 mL IVPB        2 g 200 mL/hr over 30 Minutes Intravenous Every 12 hours 08/08/2021 1647 08/17/2021 2046   07/25/2021 0600  cefoTEtan (CEFOTAN) 2 g in sodium chloride 0.9 % 100 mL IVPB        2 g 200  mL/hr over 30 Minutes Intravenous On call to O.R. 08/01/2021 6712 07/30/2021 0806       Assessment/Plan: s/p Procedure(s): LYSIS OF ADHESION (N/A) SMALL BOWEL RESECTION X 2 WITH END COLOSTOMY (N/A) Continue ng and bowel rest TPN for nutrition support Severe malnutrition, prolonged ileus - on TPN    Dehydration: cont MIV    pSBO: due to pelvic adhesions from pelvic abscess. IR unable to place decompressive PEG tube.  Pt unable to tolerate a PO diet, taken to OR 12/14 for ex lap.  Several loops of small bowel adherent to completely dehisced anastomosis.  SBR x2 with endo colostomy, pathology c/w carcinomatosis.  Did not tolerate NG clamping trials   Anemia: Hgb 8.2   Anastomotic dehiscence  - JP drainage catheter appears to be in appropriate position, Zosyn d/c'd 11/14.  wbc normal.  - Rpt CT 11/20 shows increased abscess size with drain in correct position.   restarted drain flushes.  No sign of systemic infection.  Discussed with IR on 11/21.  No further targets available for additional drainage.   7 day course of abx repeated.   - CT 11/28 fluid collection more organized but slightly smaller, IR placed TG drain 11/30, Zosyn x5 days -12/8: GGF enema shows anastomotic leak, controlled with drain - 12/14 complete dehischence of anastomosis found on ex lap.  Colostomy created - 12/22 Abd JP removed -CT 12/24 shows continued pelvic abscess and new RLQ fluid collection.  Restarted Zosyn 12/25 and have IR eval for drainage - patient refuses again today but states he will have this done tomorrow. Discussed with IR, they are going to repeat CT scan today.    ID: currently zosyn 12/25>> for new fluid collection seen on CT   Physical deconditioning: Ambulate in hall TID, PT/OT when patient agreeable   Narcotic dependence: dilaudid prn for pain; continue IV tylenol today and scheduled IV robaxin. Will see if palliative team could also help with pain management   Urinary retention: foley replaced  on Mon 11/1.  foley out again 11/7, failed voiding trial 11/9 with elevated PVR.  Foley replaced, will need urology f/u as outpatient.  Pt appeared to have a neurogenic bladder during ex lap. Follow up with uro as outpatient with foley for urodynamic tests    Elevated Cr - improving   Pt with forming carcinomatous on recent exlap and new liver lesions concerning for mets on most recent CT.  Dr Benay Spice and pt aware.  Nothing further to do until recovered from GI standpoint. Dr.  Sherrill to follow up with patient today   Dispo: pt has a temporary housing plan in place at discharge in [place] with his Aunt.  TOC team consulted and working with housing authority to assist with long term housing. Await return in bowel function. Palliative consult for assistance with goals of care   LOS: 65 days    Autumn Messing III 10/19/2021

## 2021-10-19 DEATH — deceased

## 2021-10-20 ENCOUNTER — Inpatient Hospital Stay (HOSPITAL_COMMUNITY): Payer: Medicare HMO

## 2021-10-20 ENCOUNTER — Other Ambulatory Visit (HOSPITAL_COMMUNITY): Payer: Self-pay

## 2021-10-20 LAB — COMPREHENSIVE METABOLIC PANEL
ALT: 17 U/L (ref 0–44)
AST: 15 U/L (ref 15–41)
Albumin: 2.3 g/dL — ABNORMAL LOW (ref 3.5–5.0)
Alkaline Phosphatase: 61 U/L (ref 38–126)
Anion gap: 6 (ref 5–15)
BUN: 32 mg/dL — ABNORMAL HIGH (ref 8–23)
CO2: 22 mmol/L (ref 22–32)
Calcium: 8.4 mg/dL — ABNORMAL LOW (ref 8.9–10.3)
Chloride: 112 mmol/L — ABNORMAL HIGH (ref 98–111)
Creatinine, Ser: 1 mg/dL (ref 0.61–1.24)
GFR, Estimated: >60 mL/min (ref >60)
Glucose, Bld: 210 mg/dL — ABNORMAL HIGH (ref 70–99)
Potassium: 3.8 mmol/L (ref 3.5–5.1)
Sodium: 140 mmol/L (ref 135–145)
Total Bilirubin: 0.4 mg/dL (ref 0.3–1.2)
Total Protein: 6.6 g/dL (ref 6.5–8.1)

## 2021-10-20 LAB — CBC WITH DIFFERENTIAL/PLATELET
Abs Immature Granulocytes: 0.04 10*3/uL (ref 0.00–0.07)
Basophils Absolute: 0 10*3/uL (ref 0.0–0.1)
Basophils Relative: 0 %
Eosinophils Absolute: 0.4 10*3/uL (ref 0.0–0.5)
Eosinophils Relative: 5 %
HCT: 27 % — ABNORMAL LOW (ref 39.0–52.0)
Hemoglobin: 8.3 g/dL — ABNORMAL LOW (ref 13.0–17.0)
Immature Granulocytes: 1 %
Lymphocytes Relative: 11 %
Lymphs Abs: 1 10*3/uL (ref 0.7–4.0)
MCH: 27 pg (ref 26.0–34.0)
MCHC: 30.7 g/dL (ref 30.0–36.0)
MCV: 87.9 fL (ref 80.0–100.0)
Monocytes Absolute: 0.5 10*3/uL (ref 0.1–1.0)
Monocytes Relative: 6 %
Neutro Abs: 6.7 10*3/uL (ref 1.7–7.7)
Neutrophils Relative %: 77 %
Platelets: 500 10*3/uL — ABNORMAL HIGH (ref 150–400)
RBC: 3.07 MIL/uL — ABNORMAL LOW (ref 4.22–5.81)
RDW: 17.4 % — ABNORMAL HIGH (ref 11.5–15.5)
WBC: 8.6 10*3/uL (ref 4.0–10.5)
nRBC: 0 % (ref 0.0–0.2)

## 2021-10-20 LAB — TRIGLYCERIDES: Triglycerides: 182 mg/dL — ABNORMAL HIGH (ref <150)

## 2021-10-20 LAB — PHOSPHORUS: Phosphorus: 3.5 mg/dL (ref 2.5–4.6)

## 2021-10-20 LAB — GLUCOSE, CAPILLARY
Glucose-Capillary: 141 mg/dL — ABNORMAL HIGH (ref 70–99)
Glucose-Capillary: 115 mg/dL — ABNORMAL HIGH (ref 70–99)
Glucose-Capillary: 142 mg/dL — ABNORMAL HIGH (ref 70–99)

## 2021-10-20 LAB — PREALBUMIN: Prealbumin: 22.4 mg/dL (ref 18–38)

## 2021-10-20 LAB — MAGNESIUM: Magnesium: 1.9 mg/dL (ref 1.7–2.4)

## 2021-10-20 MED ORDER — TRAVASOL 10 % IV SOLN
INTRAVENOUS | Status: AC
  Filled 2021-10-20: qty 1101.6

## 2021-10-20 MED ORDER — METOCLOPRAMIDE HCL 5 MG/ML IJ SOLN
10.0000 mg | Freq: Three times a day (TID) | INTRAMUSCULAR | Status: DC
  Administered 2021-10-20 – 2021-11-03 (×38): 10 mg via INTRAVENOUS
  Filled 2021-10-20 (×40): qty 2

## 2021-10-20 MED ORDER — MAGNESIUM SULFATE 2 GM/50ML IV SOLN
2.0000 g | Freq: Once | INTRAVENOUS | Status: AC
  Administered 2021-10-20: 2 g via INTRAVENOUS
  Filled 2021-10-20: qty 50

## 2021-10-20 MED ORDER — FENTANYL 50 MCG/HR TD PT72
1.0000 | MEDICATED_PATCH | TRANSDERMAL | Status: DC
  Administered 2021-10-20 – 2021-11-04 (×6): 1 via TRANSDERMAL
  Filled 2021-10-20 (×6): qty 1

## 2021-10-20 NOTE — Progress Notes (Signed)
Patient called RN to bedside. NGT had come further out of patient's nose. RN re-taped NGT and advanced NGT based on prior xray results. Orders for xray placed to confirm placement.

## 2021-10-20 NOTE — Progress Notes (Signed)
19 Days Post-Op   Subjective/Chief Complaint: No acute changes. NG output remains high and bilious. NG dislodged and readvanced overnight and is in appropriate position on XR.    Objective: Vital signs in last 24 hours: Temp:  [98 F (36.7 C)-98.8 F (37.1 C)] 98.8 F (37.1 C) (01/02 0411) Pulse Rate:  [85-93] 93 (01/02 0411) Resp:  [16-17] 16 (01/02 0411) BP: (108-110)/(75-79) 108/79 (01/02 0411) SpO2:  [98 %-99 %] 99 % (01/02 0411) Weight:  [58.9 kg] 58.9 kg (01/02 0412) Last BM Date: 10/19/21  Intake/Output from previous day: 01/01 0701 - 01/02 0700 In: 4241.4 [P.O.:1020; I.V.:2483.2; IV Piggyback:738.2] Out: 9735 [Urine:1900; Emesis/NG output:900; Stool:650] Intake/Output this shift: Total I/O In: -  Out: 600 [Urine:250; Emesis/NG output:350]  General: resting comfortably, NAD Neuro: alert and oriented, no focal deficits HEENT: NG with bilious drainage Resp: normal work of breathing Abdomen: soft, nondistended, nontender to palpation. Incisions clean and dry. Ileostomy productive, colostomy pink. Extremities: warm and well-perfused   Lab Results:  Recent Labs    10/20/21 0039  WBC 8.6  HGB 8.3*  HCT 27.0*  PLT 500*   BMET Recent Labs    10/19/21 0439 10/20/21 0039  NA 141 140  K 3.9 3.8  CL 110 112*  CO2 23 22  GLUCOSE 133* 210*  BUN 38* 32*  CREATININE 0.70 1.00  CALCIUM 8.3* 8.4*   PT/INR No results for input(s): LABPROT, INR in the last 72 hours. ABG No results for input(s): PHART, HCO3 in the last 72 hours.  Invalid input(s): PCO2, PO2  Studies/Results: DG ABD ACUTE 2+V W 1V CHEST  Result Date: 10/19/2021 CLINICAL DATA:  Evaluate for small bowel obstruction. EXAM: DG ABDOMEN ACUTE WITH 1 VIEW CHEST COMPARISON:  10/16/2021 FINDINGS: Right chest wall port a catheter noted with tip projecting over the distal SVC. NG tube is again noted with tip projecting over the proximal stomach and side port approximately 2 cm above the GE junction. Enteric  contrast material is again identified within the colon. This is unchanged in position from the previous exam. Pigtail drainage catheter is again noted within the left pelvis. Unchanged appearance of dilated small bowel loops. Heart size and mediastinal contours appear normal. Lungs are clear. No signs of pleural effusion or edema. IMPRESSION: 1. No change in the appearance of dilated small bowel loops compatible with partial small bowel obstruction. 2. Enteric contrast material within the colon is unchanged in position. 3. Stable position of pigtail drainage catheter within the left pelvis. Electronically Signed   By: Kerby Moors M.D.   On: 10/19/2021 12:50   DG Abd Portable 1V  Result Date: 10/20/2021 CLINICAL DATA:  Nasogastric tube placement EXAM: PORTABLE ABDOMEN - 1 VIEW COMPARISON:  10/19/2021 FINDINGS: Nasogastric tube tip is seen looped into the gastric fundus. Multiple gas-filled dilated loops of a small bowel are again seen throughout the abdomen in keeping with a partial small bowel obstruction. Residual contrast again noted within a nondistended colon, unchanged from prior examination. Pigtail drainage catheter noted within the left hemipelvis. IMPRESSION: Nasogastric tube tip looped within the gastric fundus. Findings suggestive of a partial small bowel obstruction, similar to that noted on prior CT examination of 10/16/2021. Electronically Signed   By: Fidela Salisbury M.D.   On: 10/20/2021 02:54    Anti-infectives: Anti-infectives (From admission, onward)    Start     Dose/Rate Route Frequency Ordered Stop   10/12/21 1000  piperacillin-tazobactam (ZOSYN) IVPB 3.375 g        3.375  g 12.5 mL/hr over 240 Minutes Intravenous Every 8 hours 10/12/21 0727 10/28/21 1148   09/30/2021 2200  cefoTEtan (CEFOTAN) 1 g in sodium chloride 0.9 % 100 mL IVPB  Status:  Discontinued        1 g 200 mL/hr over 30 Minutes Intravenous Every 12 hours 09/25/2021 1608 10/02/2021 1643   09/29/2021 2200  cefoTEtan  (CEFOTAN) 2 g in sodium chloride 0.9 % 100 mL IVPB        2 g 200 mL/hr over 30 Minutes Intravenous  Once 09/21/2021 1644 09/22/2021 2212   09/30/2021 1158  sodium chloride 0.9 % with cefoTEtan (CEFOTAN) ADS Med       Note to Pharmacy: Georgena Spurling W: cabinet override      10/08/2021 1158 10/02/21 0014   09/17/21 1730  piperacillin-tazobactam (ZOSYN) IVPB 3.375 g        3.375 g 12.5 mL/hr over 240 Minutes Intravenous Every 8 hours 09/17/21 1631 09/22/21 1001   09/08/21 1400  cefTRIAXone (ROCEPHIN) 2 g in sodium chloride 0.9 % 100 mL IVPB       Note to Pharmacy: Pharmacy may adjust dosing strength / duration / interval for maximal efficacy   2 g 200 mL/hr over 30 Minutes Intravenous Every 24 hours 09/08/21 1307 09/14/21 1431   08/26/21 1000  metroNIDAZOLE (FLAGYL) IVPB 500 mg  Status:  Discontinued        500 mg 100 mL/hr over 60 Minutes Intravenous Every 12 hours 08/26/21 0830 08/26/21 0910   08/26/21 1000  piperacillin-tazobactam (ZOSYN) IVPB 3.375 g  Status:  Discontinued        3.375 g 12.5 mL/hr over 240 Minutes Intravenous Every 8 hours 08/26/21 0910 09/01/21 0819   08/25/21 1030  metroNIDAZOLE (FLAGYL) tablet 500 mg  Status:  Discontinued        500 mg Per Tube Every 12 hours 08/25/21 0935 08/26/21 0830   08/25/21 1000  cefTRIAXone (ROCEPHIN) 2 g in sodium chloride 0.9 % 100 mL IVPB  Status:  Discontinued       Note to Pharmacy: Pharmacy may adjust dosing strength / duration / interval for maximal efficacy   2 g 200 mL/hr over 30 Minutes Intravenous Every 24 hours 08/25/21 0823 08/26/21 0910   08/25/21 1000  metroNIDAZOLE (FLAGYL) tablet 500 mg  Status:  Discontinued        500 mg Oral Every 12 hours 08/25/21 0837 08/25/21 0935   08/03/2021 2000  cefoTEtan (CEFOTAN) 2 g in sodium chloride 0.9 % 100 mL IVPB        2 g 200 mL/hr over 30 Minutes Intravenous Every 12 hours 08/11/2021 1647 08/18/2021 2046   07/20/2021 0600  cefoTEtan (CEFOTAN) 2 g in sodium chloride 0.9 % 100 mL IVPB        2 g 200  mL/hr over 30 Minutes Intravenous On call to O.R. 07/28/2021 0513 07/26/2021 0806       Assessment/Plan: Rectal cancer s/p LAR and diverting ileostomy 07/31/2021. Prolonged ileus with takeback 12/14 for anastomotic leak and SBO - SBR with end colostomy performed. - Patient with prolonged ileus and malnutrition - Remain NPO with NG to low intermittent suction - Continue TPN - WOC following for ostomy care - On abx for presacral abscess - VTE: lovenox, SCDs    LOS: 66 days    Dwan Bolt 10/20/2021

## 2021-10-20 NOTE — Progress Notes (Signed)
RN informed patient NGT needs to be advanced further. Patient stating, "Nah nah I'm not ready for all that right now." Will continue to monitor for any worsening symptoms due to NGT placement.

## 2021-10-20 NOTE — Consult Note (Signed)
Effort Nurse follow up:  Patient receiving care in [id number] Stoma type/location: RUQ Ileostomy LLQ End colostomy (Dr. Marcello Moores, 12/14) Previous ileostomy (this admission, 10/28) Stomal assessment/size:  Ileostomy: 1 and 1/8, pink, prolapsed, moist End colostomy: 1 and 1/4 inch red, moist, lumen at 3 o'clock, flush with abdomen. Peristomal assessment: intact; seems to have more pain along distal aspect of each stoma with pouch removal  Treatment options for stomal/peristomal skin: skin barrier rings Output: Ileostomy: liquid green    End colostomy: mucous tan brown  Ostomy pouching: 2pc. 2 and 1/4 inch pouching systems with skin barrier rings Education provided:  Patient can verbalize steps for care of his ostomies.  Supplies in the room for pouch changes  Enrolled patient in [facility] program: Yes (previously)  [place] team will continue to follow weekly  Jocelyn Lamer L. Tamala Julian, MSN, RN, [place], Lysle Pearl, [facility] Wound Treatment Associate Pager [phone]

## 2021-10-20 NOTE — Progress Notes (Signed)
PHARMACY - TOTAL PARENTERAL NUTRITION CONSULT NOTE   Indication: intolerance to enteral feeding  Patient Measurements: Height: 5\' 6"  (167.6 cm) Weight: 58.9 kg (129 lb 13.6 oz) IBW/kg (Calculated) : 63.8 TPN AdjBW (KG): 60.1 Body mass index is 20.96 kg/m.  Assessment:  66 yo M presents on 10/28 for eval of rectal cancer. S/P LAR and diverting ileostomy. Pharmacy consulted to start TPN 11/5. Stopped TPN on 11/15 but had to restart on 11/17 as patient vomited as is unable to tolerate PO.  Glucose / Insulin: No Hx DM.  - on sensitive SSI TID (used 1 unit in 24 hrs) - cbgs: 86-141 Electrolytes: Na now WNL(added back to TPN 12/28) - CorrCa 9.7; other lytes wnl except magnesium is borderline low at 1.9, chloride slightly elevated at 112  Renal: SCr 1.0, BUN down 32 Hepatic: 12/29: Albumin remains low; LFTs and Tbili WNL TG: 12/26: WNL. 1/2: 182  Intake / Output: 791 ml - UOP 1900 ml, NG output 900 ml; drain 0 mL, stool 650 mL IVF: NS stopped on 12/24. GI Imaging: 11/4 CT abdomen shows fluid collection, SBO, bladder thickening  11/17 KUB: Dilated small bowel loops are noted concerning for distal SBO 11/19 CT:  SBO, increase in size of gas and fluid abscess formation  11/28 CTAP: mild decrease in gas/gjuid in presacral space, no change in SBO 12/5 CTAP:  severe dilatation of small bowel loops in the abdomen concerning for ileus/obstruction 12/7 GGF enema: shows anastomotic leak, controlled with drain 12/24 CT: continued pelvic abscess and new RLQ fluid collection GI Surgeries / Procedures:  10/28: LAR/Diverting ileostomy  11/30: Successful CT-guided presacral pelvic abscess drain placement 12/14: Small bowel resection x 2 with end colostomy, LOA; NGT placed for decompression   Central access: Implanted Port TPN start date: 11/5 >> 11/15. Resumed 11/17>>  Nutritional Goals:  Cyclic TPN:  1275 mL provides 110 g of protein and 1984 kcals per day  RD Assessment: Estimated  Needs Total Energy Estimated Needs: 1950-2150 Total Protein Estimated Needs: 100-110g Total Fluid Estimated Needs: 2L/day  Current Nutrition:  NPO TPN transitioned to 12 hour cyclic (17/00)  Plan:  Now:  Magnesium sulfate 2 gr IV x 1  At 1749: Continue cyclic TPN. Infuse 2,040 mL over 12 hrs : 93 mL/hr x 1 hr, then 185 mL/hr x 10 hrs, then 93 mL/hr x 1 hr. Electrolytes in TPN Na 30 mEq/L Inc K 60 mEq/L Ca 53mEq/L Inc Mg 4 mEq/L Phos 15 mmol/L Cl:Ac max acetate Add standard MVI and trace elements to TPN Continue sensitive SSI with 3 custom time checks per day based on cyclic TPN.  TPN labs on Mon/Thurs   Royetta Asal, PharmD, BCPS 10/20/2021 9:52 AM

## 2021-10-21 LAB — PHOSPHORUS: Phosphorus: 2.8 mg/dL (ref 2.5–4.6)

## 2021-10-21 LAB — BASIC METABOLIC PANEL
Anion gap: 5 (ref 5–15)
BUN: 31 mg/dL — ABNORMAL HIGH (ref 8–23)
CO2: 26 mmol/L (ref 22–32)
Calcium: 8.6 mg/dL — ABNORMAL LOW (ref 8.9–10.3)
Chloride: 110 mmol/L (ref 98–111)
Creatinine, Ser: 0.81 mg/dL (ref 0.61–1.24)
GFR, Estimated: >60 mL/min (ref >60)
Glucose, Bld: 111 mg/dL — ABNORMAL HIGH (ref 70–99)
Potassium: 4.2 mmol/L (ref 3.5–5.1)
Sodium: 141 mmol/L (ref 135–145)

## 2021-10-21 LAB — GLUCOSE, CAPILLARY
Glucose-Capillary: 105 mg/dL — ABNORMAL HIGH (ref 70–99)
Glucose-Capillary: 100 mg/dL — ABNORMAL HIGH (ref 70–99)
Glucose-Capillary: 120 mg/dL — ABNORMAL HIGH (ref 70–99)

## 2021-10-21 LAB — MAGNESIUM: Magnesium: 2.3 mg/dL (ref 1.7–2.4)

## 2021-10-21 MED ORDER — TRAVASOL 10 % IV SOLN
INTRAVENOUS | Status: AC
  Filled 2021-10-21: qty 1101.6

## 2021-10-21 NOTE — Progress Notes (Signed)
PHARMACY - TOTAL PARENTERAL NUTRITION CONSULT NOTE   Indication: intolerance to enteral feeding  Patient Measurements: Height: 5\' 6"  (167.6 cm) Weight: 60.5 kg (133 lb 6.1 oz) IBW/kg (Calculated) : 63.8 TPN AdjBW (KG): 60.1 Body mass index is 21.53 kg/m.  Assessment:  66 yo M presents on 10/28 for eval of rectal cancer. S/P LAR and diverting ileostomy. Pharmacy consulted to start TPN 11/5. Stopped TPN on 11/15 but had to restart on 11/17 as patient vomited as is unable to tolerate PO.  Glucose / Insulin: No Hx DM.  - on sensitive SSI TID (used 1 unit in 24 hrs) - cbgs: 105-142 Electrolytes:   - CorrCa 9.8; other lytes wnl except magnesium is borderline low at 1.9, chloride slightly elevated at 112  Renal: SCr 0.81, BUN down 31 Hepatic: 1/2: Albumin remains low; LFTs and Tbili WNL TG: 12/26: WNL. 1/2: 182  Intake / Output: 791 ml - UOP 1900 ml, NG output 1450 ml; drain 0 mL, stool 50 mL IVF: NS stopped on 12/24. GI Imaging: 11/4 CT abdomen shows fluid collection, SBO, bladder thickening  11/17 KUB: Dilated small bowel loops are noted concerning for distal SBO 11/19 CT:  SBO, increase in size of gas and fluid abscess formation  11/28 CTAP: mild decrease in gas/gjuid in presacral space, no change in SBO 12/5 CTAP:  severe dilatation of small bowel loops in the abdomen concerning for ileus/obstruction 12/7 GGF enema: shows anastomotic leak, controlled with drain 12/24 CT: continued pelvic abscess and new RLQ fluid collection GI Surgeries / Procedures:  10/28: LAR/Diverting ileostomy  11/30: Successful CT-guided presacral pelvic abscess drain placement 12/14: Small bowel resection x 2 with end colostomy, LOA; NGT placed for decompression   Central access: Implanted Port TPN start date: 11/5 >> 11/15. Resumed 11/17>>  Nutritional Goals:  Cyclic TPN:  5625 mL provides 110 g of protein and 1984 kcals per day  RD Assessment: Estimated Needs Total Energy Estimated Needs:  1950-2150 Total Protein Estimated Needs: 100-110g Total Fluid Estimated Needs: 2L/day  Current Nutrition:  NPO TPN transitioned to 12 hour cyclic (63/89)  Plan:    At 3734: Continue cyclic TPN. Infuse 2,040 mL over 12 hrs : 93 mL/hr x 1 hr, then 185 mL/hr x 10 hrs, then 93 mL/hr x 1 hr. Electrolytes in TPN Na 30 mEq/L K 60 mEq/L Ca 47mEq/L Mg 4 mEq/L Phos 15 mmol/L Cl:Ac max acetate Add standard MVI and trace elements to TPN Continue sensitive SSI with 3 custom time checks per day based on cyclic TPN.  BMP, magnesium, phosphorus with AM labs  TPN labs on Mon/Thurs   Royetta Asal, PharmD, BCPS 10/21/2021 10:36 AM

## 2021-10-21 NOTE — Progress Notes (Signed)
Nutrition Follow-up  DOCUMENTATION CODES:   Severe malnutrition in context of chronic illness  INTERVENTION:   -Cyclic TPN management per Pharmacy  NUTRITION DIAGNOSIS:   Severe Malnutrition related to chronic illness, cancer and cancer related treatments as evidenced by severe fat depletion, severe muscle depletion.  Ongoing.  GOAL:   Patient will meet greater than or equal to 90% of their needs  Meeting with TPN  MONITOR:   Diet advancement, Labs, Weight trends, Skin, I & O's, Other (Comment) (TPN), GOC  ASSESSMENT:   66 year old male who presented on 10/28 for surgery. PMH of stage III rectal cancer s/p chemotherapy and radiation.    10/28 - s/p cystoscopy with balloon dilation of bulbar urethral stricture and bilateral ureteral catheterization, lower anterior colon resection with diverting ileostomy; clear liquids 10/29 - NPO 10/30 - full liquids 10/31 - NPO 11/2 - NGT placed with significant output 11/4 - CT abdomen showing fluid collection, SBO, bladder thickening 11/5 - TPN started 11/7 - NGT removed 11/9 - CLD 11/10 - FLD 11/11 - Soft diet  11/15 - TPN stopped 11/17 - emesis x2, TPN restarted 11/30 -IR placed TG drain 12/14- s/p LOA, SB resection x 2 w/ end colostomy, NGT replaced  Patient continue to be NPO. NGT set to suction, output: 1550 ml x 24 hrs.  Cyclic TPN continues x 12 hours, providing 1983 kcals and 110g protein.  Admission weight: 131 lbs Current weight: 133 lbs  Medications: Reglan, Remeron, Octreotide  Labs reviewed:  CBGs: 105-142  Diet Order:   Diet Order             Diet NPO time specified Except for: Ice Chips  Diet effective now                   EDUCATION NEEDS:   Education needs have been addressed  Skin:  Skin Assessment: Skin Integrity Issues: Skin Integrity Issues:: Incisions Incisions: abdomen  Last BM:  1/3 -type 7 -ileostomy  Height:   Ht Readings from Last 1 Encounters:  09/20/2021 5\' 6"  (1.676 m)     Weight:   Wt Readings from Last 1 Encounters:  10/21/21 60.5 kg    BMI:  Body mass index is 21.53 kg/m.  Estimated Nutritional Needs:   Kcal:  1950-2150  Protein:  100-110g  Fluid:  2L/day   Clayton Bibles, MS, RD, LDN Inpatient Clinical Dietitian Contact information available via Amion

## 2021-10-21 NOTE — Progress Notes (Signed)
20 Days Post-Op LOA, SBR and end colostomy creation  Subjective/Chief Complaint: No new issues, did not tolerate clamping trials over the weekend.  Speech slurred and unable to focus on conversation  Vital signs in last 24 hours: Temp:  [97.3 F (36.3 C)-98.7 F (37.1 C)] 97.3 F (36.3 C) (01/03 0626) Pulse Rate:  [86-93] 86 (01/03 0626) Resp:  [16-18] 18 (01/03 0626) BP: (117-135)/(75-85) 121/85 (01/03 0626) SpO2:  [98 %-100 %] 100 % (01/03 0626) Weight:  [60.5 kg] 60.5 kg (01/03 0500) Last BM Date: 10/20/21  Intake/Output from previous day: 01/02 0701 - 01/03 0700 In: 4235.8 [P.O.:420; I.V.:3217.3; IV Piggyback:598.5] Out: 4310 [Urine:2450; Emesis/NG output:1450; Drains:10; Stool:400] Intake/Output this shift: No intake/output data recorded.  General appearance: alert, cooperative, and no distress GI: non-distended, appropriately tender in lower abdomen; ostomy pink with liquid stool, colostomy  viable NG output clear Incisions OK, JP drain with SS drainage  Lab Results:  Recent Labs    10/20/21 0039  WBC 8.6  HGB 8.3*  HCT 27.0*  PLT 500*    BMET Recent Labs    10/19/21 0439 10/20/21 0039  NA 141 140  K 3.9 3.8  CL 110 112*  CO2 23 22  GLUCOSE 133* 210*  BUN 38* 32*  CREATININE 0.70 1.00  CALCIUM 8.3* 8.4*    PT/INR No results for input(s): LABPROT, INR in the last 72 hours. ABG No results for input(s): PHART, HCO3 in the last 72 hours.  Invalid input(s): PCO2, PO2  Studies/Results: DG ABD ACUTE 2+V W 1V CHEST  Result Date: 10/19/2021 CLINICAL DATA:  Evaluate for small bowel obstruction. EXAM: DG ABDOMEN ACUTE WITH 1 VIEW CHEST COMPARISON:  10/16/2021 FINDINGS: Right chest wall port a catheter noted with tip projecting over the distal SVC. NG tube is again noted with tip projecting over the proximal stomach and side port approximately 2 cm above the GE junction. Enteric contrast material is again identified within the colon. This is unchanged in  position from the previous exam. Pigtail drainage catheter is again noted within the left pelvis. Unchanged appearance of dilated small bowel loops. Heart size and mediastinal contours appear normal. Lungs are clear. No signs of pleural effusion or edema. IMPRESSION: 1. No change in the appearance of dilated small bowel loops compatible with partial small bowel obstruction. 2. Enteric contrast material within the colon is unchanged in position. 3. Stable position of pigtail drainage catheter within the left pelvis. Electronically Signed   By: Kerby Moors M.D.   On: 10/19/2021 12:50   DG Abd Portable 1V  Result Date: 10/20/2021 CLINICAL DATA:  Nasogastric tube placement EXAM: PORTABLE ABDOMEN - 1 VIEW COMPARISON:  10/19/2021 FINDINGS: Nasogastric tube tip is seen looped into the gastric fundus. Multiple gas-filled dilated loops of a small bowel are again seen throughout the abdomen in keeping with a partial small bowel obstruction. Residual contrast again noted within a nondistended colon, unchanged from prior examination. Pigtail drainage catheter noted within the left hemipelvis. IMPRESSION: Nasogastric tube tip looped within the gastric fundus. Findings suggestive of a partial small bowel obstruction, similar to that noted on prior CT examination of 10/16/2021. Electronically Signed   By: Fidela Salisbury M.D.   On: 10/20/2021 02:54    Anti-infectives: Anti-infectives (From admission, onward)    Start     Dose/Rate Route Frequency Ordered Stop   10/12/21 1000  piperacillin-tazobactam (ZOSYN) IVPB 3.375 g        3.375 g 12.5 mL/hr over 240 Minutes Intravenous Every 8 hours  10/12/21 0727 10/28/21 1148   09/30/2021 2200  cefoTEtan (CEFOTAN) 1 g in sodium chloride 0.9 % 100 mL IVPB  Status:  Discontinued        1 g 200 mL/hr over 30 Minutes Intravenous Every 12 hours 10/04/2021 1608 10/05/2021 1643   09/30/2021 2200  cefoTEtan (CEFOTAN) 2 g in sodium chloride 0.9 % 100 mL IVPB        2 g 200 mL/hr over 30  Minutes Intravenous  Once 09/18/2021 1644 10/11/2021 2212   10/11/2021 1158  sodium chloride 0.9 % with cefoTEtan (CEFOTAN) ADS Med       Note to Pharmacy: Georgena Spurling W: cabinet override      10/12/2021 1158 10/02/21 0014   09/17/21 1730  piperacillin-tazobactam (ZOSYN) IVPB 3.375 g        3.375 g 12.5 mL/hr over 240 Minutes Intravenous Every 8 hours 09/17/21 1631 09/22/21 1001   09/08/21 1400  cefTRIAXone (ROCEPHIN) 2 g in sodium chloride 0.9 % 100 mL IVPB       Note to Pharmacy: Pharmacy may adjust dosing strength / duration / interval for maximal efficacy   2 g 200 mL/hr over 30 Minutes Intravenous Every 24 hours 09/08/21 1307 09/14/21 1431   08/26/21 1000  metroNIDAZOLE (FLAGYL) IVPB 500 mg  Status:  Discontinued        500 mg 100 mL/hr over 60 Minutes Intravenous Every 12 hours 08/26/21 0830 08/26/21 0910   08/26/21 1000  piperacillin-tazobactam (ZOSYN) IVPB 3.375 g  Status:  Discontinued        3.375 g 12.5 mL/hr over 240 Minutes Intravenous Every 8 hours 08/26/21 0910 09/01/21 0819   08/25/21 1030  metroNIDAZOLE (FLAGYL) tablet 500 mg  Status:  Discontinued        500 mg Per Tube Every 12 hours 08/25/21 0935 08/26/21 0830   08/25/21 1000  cefTRIAXone (ROCEPHIN) 2 g in sodium chloride 0.9 % 100 mL IVPB  Status:  Discontinued       Note to Pharmacy: Pharmacy may adjust dosing strength / duration / interval for maximal efficacy   2 g 200 mL/hr over 30 Minutes Intravenous Every 24 hours 08/25/21 0823 08/26/21 0910   08/25/21 1000  metroNIDAZOLE (FLAGYL) tablet 500 mg  Status:  Discontinued        500 mg Oral Every 12 hours 08/25/21 0837 08/25/21 0935   07/21/2021 2000  cefoTEtan (CEFOTAN) 2 g in sodium chloride 0.9 % 100 mL IVPB        2 g 200 mL/hr over 30 Minutes Intravenous Every 12 hours 07/26/2021 1647 07/24/2021 2046   07/25/2021 0600  cefoTEtan (CEFOTAN) 2 g in sodium chloride 0.9 % 100 mL IVPB        2 g 200 mL/hr over 30 Minutes Intravenous On call to O.R. 08/05/2021 1610 08/13/2021 0806        Assessment/Plan: s/p Procedure(s): LYSIS OF ADHESION (N/A) SMALL BOWEL RESECTION X 2 WITH END COLOSTOMY (N/A)  Severe malnutrition, prolonged ileus: pt not eating well enough to stop TPN, can't tolerate a diet, cont NPO and NG for now, will clamp NG today and see how he does  Dehydration: cont MIV   pSBO: due to pelvic adhesions from pelvic abscess.  Pt unable to tolerate NG, IR not able to place, pt unwilling to undergo NG placement in fluoro, IR unable to place decompressive PEG tube.  Pt unable to tolerate a PO diet, taken to OR 12/14 for ex lap.  Several loops of small bowel adherent  to completely dehisced anastomosis.  SBR x2 with endo colostomy, pathology c/w carcinomatosis.  Ng output remains high.  D/c Octreotide until better bowel function and pt able to come off TPN  Anemia: most likely related to chronic illness and malnutrition.  Iron supplements ordered but pt did not tolerate this.  Iron levels low.  IV iron given in early Dec, tachy overnight, will recheck and give blood as needed  anastomotic dehiscence  - JP drainage catheter appears to be in appropriate position, Zosyn d/c'd 11/14.  wbc normal.  - Rpt CT 11/20 shows increased abscess size with drain in correct position.   restarted drain flushes.  No sign of systemic infection.  Discussed with IR on 11/21.  No further targets available for additional drainage.   7 day course of abx repeated.   - CT 11/28 fluid collection more organized but slightly smaller, IR placed TG drain 11/30, Zosyn x5 days -12/8: GGF enema shows anastomotic leak, controlled with drain - 12/14 complete dehischence of anastomosis found on ex lap.  Colostomy created - 12/22 Abd JP removed CT 12/24 shows continued pelvic abscess and new RLQ fluid collection.  Will restart Zosyn and have IR eval for drainage tomorrow CT 12/29 shows improving fluid collections and continued ileus  ID: wbc elevated off abx.  Zosyn per pharmacy.  d/c 1/3 now that course  completed and wbc normal  Physical deconditioning: Ambulate in hall TID, appreciate PT/OT involvement (Pt refusing most interventions)  Narcotic dependence: dilaudid prn for pain, will wean slowly, Palliative involved  Urinary retention: foley replaced on Mon 11/1.  foley out again 11/7, failed voiding trial 11/9 with elevated PVR.   Foley replaced, will need urology f/u as outpatient.   Pt appeared to have a neurogenic bladder during ex lap, foley out, straight cath q8h but pt develpoed increased Cr so foley replaced  Pt with forming carcinomatous on recent exlap and new liver lesions concerning for mets on most recent CT.  Dr Benay Spice and pt aware.  Nothing further to do until recovered from GI standpoint  Dispo: pt has a temporary housing plan in place at discharge in [place] with his Aunt.  TOC team consulted and working with housing authority to assist with long term housing.      LOS: 67 days    Rosario Adie 06/21/9029

## 2021-10-21 NOTE — Progress Notes (Signed)
Pt has tolerated the NG clamping with no complaints of nausea.

## 2021-10-22 LAB — BASIC METABOLIC PANEL
Anion gap: 7 (ref 5–15)
BUN: 31 mg/dL — ABNORMAL HIGH (ref 8–23)
CO2: 27 mmol/L (ref 22–32)
Calcium: 8.7 mg/dL — ABNORMAL LOW (ref 8.9–10.3)
Chloride: 105 mmol/L (ref 98–111)
Creatinine, Ser: 0.55 mg/dL — ABNORMAL LOW (ref 0.61–1.24)
GFR, Estimated: >60 mL/min (ref >60)
Glucose, Bld: 98 mg/dL (ref 70–99)
Potassium: 4.6 mmol/L (ref 3.5–5.1)
Sodium: 139 mmol/L (ref 135–145)

## 2021-10-22 LAB — GLUCOSE, CAPILLARY
Glucose-Capillary: 97 mg/dL (ref 70–99)
Glucose-Capillary: 100 mg/dL — ABNORMAL HIGH (ref 70–99)
Glucose-Capillary: 96 mg/dL (ref 70–99)
Glucose-Capillary: 87 mg/dL (ref 70–99)

## 2021-10-22 LAB — MAGNESIUM: Magnesium: 2.1 mg/dL (ref 1.7–2.4)

## 2021-10-22 LAB — PHOSPHORUS: Phosphorus: 2.8 mg/dL (ref 2.5–4.6)

## 2021-10-22 MED ORDER — SODIUM CHLORIDE 0.9 % IV SOLN
250.0000 mg | Freq: Four times a day (QID) | INTRAVENOUS | Status: DC
  Administered 2021-10-22 – 2021-10-31 (×34): 250 mg via INTRAVENOUS
  Filled 2021-10-22 (×41): qty 5

## 2021-10-22 MED ORDER — TRAVASOL 10 % IV SOLN
INTRAVENOUS | Status: AC
  Filled 2021-10-22: qty 1101.6

## 2021-10-22 MED ORDER — DEXTROSE 10 % IV SOLN: INTRAVENOUS | Status: DC

## 2021-10-22 NOTE — Progress Notes (Addendum)
Referring Physician(s): Huttig  Supervising Physician: Jacqulynn Cadet  Patient Status:  [facility] - In-pt  Chief Complaint: Abdominal pain/abscess   Subjective: Pt doing ok today(his birthday); no acute changes   Allergies: Patient has no known allergies.  Medications: Prior to Admission medications   Medication Sig Start Date End Date Taking? Authorizing Provider  acetaminophen (TYLENOL) 650 MG CR tablet Take 650 mg by mouth every 8 (eight) hours as needed for pain.   Yes [provider]  oxyCODONE (OXY IR/ROXICODONE) 5 MG immediate release tablet Take 1 tablet (5 mg total) by mouth every 6 (six) hours as needed for severe pain. 08/11/21  Yes Owens Shark, NP  traMADol (ULTRAM) 50 MG tablet Take 50 mg by mouth every 12 (twelve) hours as needed for moderate pain. 06/25/21   [provider]     Vital Signs: BP 109/72 (BP Location: Right Arm)    Pulse 86    Temp 98.4 F (36.9 C) (Oral)    Resp 18    Ht 5\' 6"  (1.676 m)    Wt 132 lb 15 oz (60.3 kg)    SpO2 99%    BMI 21.46 kg/m   Physical Exam awake/alert; 10 fr left TG drain intact, insertion site ok, OP 25 cc light brown fluid- to JP; chest- CTA bilat; heart- RRR;  abd-non-distended, appropriately tender in lower abdomen; ostomy pink with liquid stool, colostomy  viable; NG output slight bilious  Imaging: DG ABD ACUTE 2+V W 1V CHEST  Result Date: 10/19/2021 CLINICAL DATA:  Evaluate for small bowel obstruction. EXAM: DG ABDOMEN ACUTE WITH 1 VIEW CHEST COMPARISON:  10/16/2021 FINDINGS: Right chest wall port a catheter noted with tip projecting over the distal SVC. NG tube is again noted with tip projecting over the proximal stomach and side port approximately 2 cm above the GE junction. Enteric contrast material is again identified within the colon. This is unchanged in position from the previous exam. Pigtail drainage catheter is again noted within the left pelvis. Unchanged appearance of dilated small bowel  loops. Heart size and mediastinal contours appear normal. Lungs are clear. No signs of pleural effusion or edema. IMPRESSION: 1. No change in the appearance of dilated small bowel loops compatible with partial small bowel obstruction. 2. Enteric contrast material within the colon is unchanged in position. 3. Stable position of pigtail drainage catheter within the left pelvis. Electronically Signed   By: Kerby Moors M.D.   On: 10/19/2021 12:50   DG Abd Portable 1V  Result Date: 10/20/2021 CLINICAL DATA:  Nasogastric tube placement EXAM: PORTABLE ABDOMEN - 1 VIEW COMPARISON:  10/19/2021 FINDINGS: Nasogastric tube tip is seen looped into the gastric fundus. Multiple gas-filled dilated loops of a small bowel are again seen throughout the abdomen in keeping with a partial small bowel obstruction. Residual contrast again noted within a nondistended colon, unchanged from prior examination. Pigtail drainage catheter noted within the left hemipelvis. IMPRESSION: Nasogastric tube tip looped within the gastric fundus. Findings suggestive of a partial small bowel obstruction, similar to that noted on prior CT examination of 10/16/2021. Electronically Signed   By: Fidela Salisbury M.D.   On: 10/20/2021 02:54    Labs:  CBC: Recent Labs    10/13/21 0623 10/16/21 0555 10/17/21 0621 10/20/21 0039  WBC 15.6* 10.7* 10.5 8.6  HGB 7.5* 7.0* 8.2* 8.3*  HCT 25.8* 24.8* 26.9* 27.0*  PLT 572* 603* 530* 500*    COAGS: Recent Labs    12/06/20 1706  INR  1.1*    BMP: Recent Labs    10/19/21 0439 10/20/21 0039 10/21/21 0849 10/22/21 0607  NA 141 140 141 139  K 3.9 3.8 4.2 4.6  CL 110 112* 110 105  CO2 23 22 26 27   GLUCOSE 133* 210* 111* 98  BUN 38* 32* 31* 31*  CALCIUM 8.3* 8.4* 8.6* 8.7*  CREATININE 0.70 1.00 0.81 0.55*  GFRNONAA >60 >60 >60 >60    LIVER FUNCTION TESTS: Recent Labs    10/09/21 0600 10/13/21 0623 10/16/21 0555 10/20/21 0039  BILITOT 0.3 0.4 0.3 0.4  AST 27 19 17 15   ALT 52*  42 22 17  ALKPHOS 54 67 64 61  PROT 6.6 7.4 7.7 6.6  ALBUMIN 2.1* 2.2* 2.5* 2.3*    Assessment and Plan: Pt s/p lower anterior resection with diverting ostomy on 67/59/1638 Complicated by presacral pelvic abscess development s/p left 10 f TG drain placement on 46/65/9935 Further complicated by development of right-sided anast leak noted on contrast enema 12/8 S/p lysis of adhesions with small bowel ressection with general surgery on 12/14; afebrile; patient has had declining output from drain; latest CT scan was reviewed by Dr. Louanna Raw appears tip of the catheter is on the lateral margin of the fluid collection.  Case discussed with Dr. Marcello Moores and decision made to schedule patient for drain exchange.  Plans discussed with patient and sister with their understanding and consent.  We will tentatively plan for 1/5.   Electronically Signed: D. Rowe Robert, PA-C 10/22/2021, 2:14 PM   I spent a total of 15 Minutes at the the patient's bedside AND on the patient's hospital floor or unit, greater than 50% of which was counseling/coordinating care for presacral abscess drain    Patient ID: Phillip Brooks, male   DOB: December 04, 1955, 66 y.o.   MRN: [id number]

## 2021-10-22 NOTE — Progress Notes (Signed)
PHARMACY - TOTAL PARENTERAL NUTRITION CONSULT NOTE   Indication: intolerance to enteral feeding  Patient Measurements: Height: 5\' 6"  (167.6 cm) Weight: 60.3 kg (132 lb 15 oz) IBW/kg (Calculated) : 63.8 TPN AdjBW (KG): 60.1 Body mass index is 21.46 kg/m.  Assessment:  66 yo M presents on 10/28 for eval of rectal cancer. S/P LAR and diverting ileostomy. Pharmacy consulted to start TPN 11/5. Stopped TPN on 11/15 but had to restart on 11/17 as patient vomited as is unable to tolerate PO.  Glucose / Insulin: No Hx DM.  - on sensitive SSI TID (used 0 units in 24 hrs) - cbgs: 97-120 Electrolytes:   - CorrCa 9.8; other lytes wnl , Renal: SCr 0.55, BUN down 31 Hepatic: 1/2: Albumin remains low; LFTs and Tbili WNL TG: 12/26: WNL. 1/2: 182  Intake / Output: 791 ml - UOP 2055 ml, NG output 1350 ml; drain 25 mL, stool 50 mL IVF: NS stopped on 12/24. GI Imaging: 11/4 CT abdomen shows fluid collection, SBO, bladder thickening  11/17 KUB: Dilated small bowel loops are noted concerning for distal SBO 11/19 CT:  SBO, increase in size of gas and fluid abscess formation  11/28 CTAP: mild decrease in gas/gjuid in presacral space, no change in SBO 12/5 CTAP:  severe dilatation of small bowel loops in the abdomen concerning for ileus/obstruction 12/7 GGF enema: shows anastomotic leak, controlled with drain 12/24 CT: continued pelvic abscess and new RLQ fluid collection GI Surgeries / Procedures:  10/28: LAR/Diverting ileostomy  11/30: Successful CT-guided presacral pelvic abscess drain placement 12/14: Small bowel resection x 2 with end colostomy, LOA; NGT placed for decompression   Central access: Implanted Port TPN start date: 11/5 >> 11/15. Resumed 11/17>>  Nutritional Goals:  Cyclic TPN:  8099 mL provides 110 g of protein and 1984 kcals per day  RD Assessment: Estimated Needs Total Energy Estimated Needs: 1950-2150 Total Protein Estimated Needs: 100-110g Total Fluid Estimated Needs:  2L/day  Current Nutrition:  NPO TPN transitioned to 12 hour cyclic (83/38)  Plan:    At 2505: Continue cyclic TPN. Infuse 2,040 mL over 12 hrs : 93 mL/hr x 1 hr, then 185 mL/hr x 10 hrs, then 93 mL/hr x 1 hr. Electrolytes in TPN Na 30 mEq/L K 60 mEq/L Ca 16mEq/L Mg 4 mEq/L Phos 15 mmol/L Cl:Ac max acetate Add standard MVI and trace elements to TPN Continue sensitive SSI with 3 custom time checks per day based on cyclic TPN.   TPN labs on Mon/Thurs   Royetta Asal, PharmD, BCPS 10/22/2021 7:26 AM

## 2021-10-22 NOTE — Progress Notes (Signed)
21 Days Post-Op LOA, SBR and end colostomy creation  Subjective/Chief Complaint: No new issues, tolerated NG clamped for 6h yesterday.   Vital signs in last 24 hours: Temp:  [98 F (36.7 C)-99 F (37.2 C)] 98.4 F (36.9 C) (01/04 0554) Pulse Rate:  [82-87] 86 (01/04 0554) Resp:  [15-18] 18 (01/04 0554) BP: (109-119)/(72-78) 109/72 (01/04 0554) SpO2:  [99 %] 99 % (01/04 0554) Weight:  [60.3 kg] 60.3 kg (01/04 0554) Last BM Date: 10/21/21  Intake/Output from previous day: 01/03 0701 - 01/04 0700 In: 2633.8 [P.O.:440; I.V.:1989; IV Piggyback:199.9] Out: 3855 [Urine:2055; Emesis/NG output:1350; Drains:25; Stool:425] Intake/Output this shift: Total I/O In: -  Out: 600 [Urine:400; Stool:200]  General appearance: alert, cooperative, and no distress GI: non-distended, appropriately tender in lower abdomen; ostomy pink with liquid stool, colostomy  viable NG output slight bilious Incisions OK, JP drain with purulent drainage  Lab Results:  Recent Labs    10/20/21 0039  WBC 8.6  HGB 8.3*  HCT 27.0*  PLT 500*    BMET Recent Labs    10/21/21 0849 10/22/21 0607  NA 141 139  K 4.2 4.6  CL 110 105  CO2 26 27  GLUCOSE 111* 98  BUN 31* 31*  CREATININE 0.81 0.55*  CALCIUM 8.6* 8.7*    PT/INR No results for input(s): LABPROT, INR in the last 72 hours. ABG No results for input(s): PHART, HCO3 in the last 72 hours.  Invalid input(s): PCO2, PO2  Studies/Results: No results found.  Anti-infectives: Anti-infectives (From admission, onward)    Start     Dose/Rate Route Frequency Ordered Stop   10/22/21 1200  erythromycin 250 mg in sodium chloride 0.9 % 100 mL IVPB        250 mg 100 mL/hr over 60 Minutes Intravenous Every 6 hours 10/22/21 0841     10/12/21 1000  piperacillin-tazobactam (ZOSYN) IVPB 3.375 g  Status:  Discontinued        3.375 g 12.5 mL/hr over 240 Minutes Intravenous Every 8 hours 10/12/21 0727 10/21/21 0855   09/26/2021 2200  cefoTEtan (CEFOTAN) 1 g in  sodium chloride 0.9 % 100 mL IVPB  Status:  Discontinued        1 g 200 mL/hr over 30 Minutes Intravenous Every 12 hours 09/21/2021 1608 09/26/2021 1643   09/22/2021 2200  cefoTEtan (CEFOTAN) 2 g in sodium chloride 0.9 % 100 mL IVPB        2 g 200 mL/hr over 30 Minutes Intravenous  Once 09/28/2021 1644 09/24/2021 2212   09/28/2021 1158  sodium chloride 0.9 % with cefoTEtan (CEFOTAN) ADS Med       Note to Pharmacy: Georgena Spurling W: cabinet override      09/19/2021 1158 10/02/21 0014   09/17/21 1730  piperacillin-tazobactam (ZOSYN) IVPB 3.375 g        3.375 g 12.5 mL/hr over 240 Minutes Intravenous Every 8 hours 09/17/21 1631 09/22/21 1001   09/08/21 1400  cefTRIAXone (ROCEPHIN) 2 g in sodium chloride 0.9 % 100 mL IVPB       Note to Pharmacy: Pharmacy may adjust dosing strength / duration / interval for maximal efficacy   2 g 200 mL/hr over 30 Minutes Intravenous Every 24 hours 09/08/21 1307 09/14/21 1431   08/26/21 1000  metroNIDAZOLE (FLAGYL) IVPB 500 mg  Status:  Discontinued        500 mg 100 mL/hr over 60 Minutes Intravenous Every 12 hours 08/26/21 0830 08/26/21 0910   08/26/21 1000  piperacillin-tazobactam (ZOSYN) IVPB 3.375  g  Status:  Discontinued        3.375 g 12.5 mL/hr over 240 Minutes Intravenous Every 8 hours 08/26/21 0910 09/01/21 0819   08/25/21 1030  metroNIDAZOLE (FLAGYL) tablet 500 mg  Status:  Discontinued        500 mg Per Tube Every 12 hours 08/25/21 0935 08/26/21 0830   08/25/21 1000  cefTRIAXone (ROCEPHIN) 2 g in sodium chloride 0.9 % 100 mL IVPB  Status:  Discontinued       Note to Pharmacy: Pharmacy may adjust dosing strength / duration / interval for maximal efficacy   2 g 200 mL/hr over 30 Minutes Intravenous Every 24 hours 08/25/21 0823 08/26/21 0910   08/25/21 1000  metroNIDAZOLE (FLAGYL) tablet 500 mg  Status:  Discontinued        500 mg Oral Every 12 hours 08/25/21 0837 08/25/21 0935   07/19/2021 2000  cefoTEtan (CEFOTAN) 2 g in sodium chloride 0.9 % 100 mL IVPB        2  g 200 mL/hr over 30 Minutes Intravenous Every 12 hours 08/03/2021 1647 08/14/2021 2046   08/08/2021 0600  cefoTEtan (CEFOTAN) 2 g in sodium chloride 0.9 % 100 mL IVPB        2 g 200 mL/hr over 30 Minutes Intravenous On call to O.R. 08/08/2021 2229 08/03/2021 0806       Assessment/Plan: s/p Procedure(s): LYSIS OF ADHESION (N/A) SMALL BOWEL RESECTION X 2 WITH END COLOSTOMY (N/A)  Severe malnutrition, prolonged ileus: pt not eating well enough to stop TPN, can't tolerate a diet, cont NPO and NG for now, will clamp NG again tomorrow and see how he tolerates it  Dehydration: cont MIV   pSBO: due to pelvic adhesions from pelvic abscess.  Pt unable to tolerate NG, IR not able to place, pt unwilling to undergo NG placement in fluoro, IR unable to place decompressive PEG tube.  Pt unable to tolerate a PO diet, taken to OR 12/14 for ex lap.  Several loops of small bowel adherent to completely dehisced anastomosis.  SBR x2 with endo colostomy, pathology c/w carcinomatosis.  Ng output remains high.  D/c Octreotide until better bowel function and pt able to come off TPN.  Will try IV Erythromycin q6h to encourage motility  Anemia: most likely related to chronic illness and malnutrition.  Iron supplements ordered but pt did not tolerate this.  Iron levels low.  IV iron given in early Dec, tachy overnight, will recheck and give blood as needed  anastomotic dehiscence  - JP drainage catheter appears to be in appropriate position, Zosyn d/c'd 11/14.  wbc normal.  - Rpt CT 11/20 shows increased abscess size with drain in correct position.   restarted drain flushes.  No sign of systemic infection.  Discussed with IR on 11/21.  No further targets available for additional drainage.   7 day course of abx repeated.   - CT 11/28 fluid collection more organized but slightly smaller, IR placed TG drain 11/30, Zosyn x5 days -12/8: GGF enema shows anastomotic leak, controlled with drain - 12/14 complete dehischence of  anastomosis found on ex lap.  Colostomy created - 12/22 Abd JP removed CT 12/24 shows continued pelvic abscess and new RLQ fluid collection.  Will restart Zosyn and have IR eval for drainage tomorrow CT 12/29 shows improving fluid collections and continued ileus  ID: wbc elevated off abx.  Zosyn course completed and wbc normal now  Physical deconditioning: Ambulate in hall TID, appreciate PT/OT involvement (Pt refusing  most interventions)  Narcotic dependence: dilaudid prn for pain, will wean slowly, Palliative involved  Urinary retention: foley replaced on Mon 11/1.  foley out again 11/7, failed voiding trial 11/9 with elevated PVR.   Foley replaced, will need urology f/u as outpatient.   Pt appeared to have a neurogenic bladder during ex lap, foley out, straight cath q8h but pt develpoed increased Cr so foley replaced  Pt with forming carcinomatous on recent exlap and new liver lesions concerning for mets on most recent CT.  Dr Benay Spice and pt aware.  Nothing further to do until recovered from GI standpoint  Dispo: pt has a temporary housing plan in place at discharge in [place] with his Aunt.  TOC team consulted and working with housing authority to assist with long term housing.      LOS: 68 days    Rosario Adie 11/25/8339

## 2021-10-23 ENCOUNTER — Inpatient Hospital Stay (HOSPITAL_COMMUNITY): Payer: Medicare HMO

## 2021-10-23 HISTORY — PX: IR CATHETER TUBE CHANGE: IMG717

## 2021-10-23 LAB — GLUCOSE, CAPILLARY
Glucose-Capillary: 110 mg/dL — ABNORMAL HIGH (ref 70–99)
Glucose-Capillary: 106 mg/dL — ABNORMAL HIGH (ref 70–99)
Glucose-Capillary: 141 mg/dL — ABNORMAL HIGH (ref 70–99)

## 2021-10-23 LAB — COMPREHENSIVE METABOLIC PANEL
ALT: 13 U/L (ref 0–44)
AST: 15 U/L (ref 15–41)
Albumin: 2.7 g/dL — ABNORMAL LOW (ref 3.5–5.0)
Alkaline Phosphatase: 67 U/L (ref 38–126)
Anion gap: 8 (ref 5–15)
BUN: 21 mg/dL (ref 8–23)
CO2: 27 mmol/L (ref 22–32)
Calcium: 9.2 mg/dL (ref 8.9–10.3)
Chloride: 102 mmol/L (ref 98–111)
Creatinine, Ser: 0.9 mg/dL (ref 0.61–1.24)
GFR, Estimated: >60 mL/min (ref >60)
Glucose, Bld: 146 mg/dL — ABNORMAL HIGH (ref 70–99)
Potassium: 4 mmol/L (ref 3.5–5.1)
Sodium: 137 mmol/L (ref 135–145)
Total Bilirubin: 0.5 mg/dL (ref 0.3–1.2)
Total Protein: 7.4 g/dL (ref 6.5–8.1)

## 2021-10-23 LAB — PHOSPHORUS: Phosphorus: 4.1 mg/dL (ref 2.5–4.6)

## 2021-10-23 LAB — MAGNESIUM: Magnesium: 1.9 mg/dL (ref 1.7–2.4)

## 2021-10-23 MED ORDER — TRAVASOL 10 % IV SOLN
INTRAVENOUS | Status: AC
  Filled 2021-10-23: qty 1101.6

## 2021-10-23 MED ORDER — LIDOCAINE HCL 1 % IJ SOLN
INTRAMUSCULAR | Status: AC
  Administered 2021-10-23: 8 mL
  Filled 2021-10-23: qty 20

## 2021-10-23 MED ORDER — IOHEXOL 300 MG/ML  SOLN
25.0000 mL | Freq: Once | INTRAMUSCULAR | Status: AC | PRN
  Administered 2021-10-23: 10 mL

## 2021-10-23 NOTE — Progress Notes (Signed)
PHARMACY - TOTAL PARENTERAL NUTRITION CONSULT NOTE   Indication: intolerance to enteral feeding  Patient Measurements: Height: 5\' 6"  (167.6 cm) Weight: 60.3 kg (132 lb 15 oz) IBW/kg (Calculated) : 63.8 TPN AdjBW (KG): 60.1 Body mass index is 21.46 kg/m.  Assessment:  66 yo M presents on 10/28 for eval of rectal cancer. S/P LAR and diverting ileostomy. Pharmacy consulted to start TPN 11/5. Stopped TPN on 11/15 but had to restart on 11/17 as patient vomited as is unable to tolerate PO.  Glucose / Insulin: No Hx DM.  - on sensitive SSI TID (used 0 units in 24 hrs) - cbgs: 87-146 Electrolytes:   - CorrCa 10.2; Mg 1.9; other lytes wnl Renal: SCr 0.9 (slightly inc), BUN down 21 Hepatic: 1/5: Albumin remains low; LFTs and Tbili WNL TG: 12/26: WNL. 1/2: 182  Intake / Output:  - UOP 2350 ml, NG output 1500 ml; drain 15 mL, stool 900 mL IVF:  - TPN bag not hung 1/4 PM due to incorrect tubing on bag - Currently receiving D10 @ 82mL/hr - has received ~119g CHO thus far GI Imaging: 11/4 CT abdomen shows fluid collection, SBO, bladder thickening  11/17 KUB: Dilated small bowel loops are noted concerning for distal SBO 11/19 CT:  SBO, increase in size of gas and fluid abscess formation  11/28 CTAP: mild decrease in gas/gjuid in presacral space, no change in SBO 12/5 CTAP:  severe dilatation of small bowel loops in the abdomen concerning for ileus/obstruction 12/7 GGF enema: shows anastomotic leak, controlled with drain 12/24 CT: continued pelvic abscess and new RLQ fluid collection 12/29 CT: improved fluid collections and continued ileus 1/2 [place]: NG tube tip looped within gastric fundus, suggestive of partial SBO similar to that noted on CT 12/29 GI Surgeries / Procedures:  10/28: LAR/Diverting ileostomy  11/30: Successful CT-guided presacral pelvic abscess drain placement 12/14: Small bowel resection x 2 with end colostomy, LOA; NGT placed for decompression  1/4: noted that IR is planning  drain exchange for 1/5  Central access: Implanted Port TPN start date: 11/5 >> 11/15. Resumed 11/17>>  Nutritional Goals:  Cyclic TPN:  0814 mL provides 110 g of protein and 1984 kcals per day  RD Assessment: Estimated Needs Total Energy Estimated Needs: 1950-2150 Total Protein Estimated Needs: 100-110g Total Fluid Estimated Needs: 2L/day  Current Nutrition:  NPO TPN transitioned to 12 hour cyclic (48/18)  Plan:  Now: Discontinue D10 drip    At 5631: Continue cyclic TPN. Infuse 2,040 mL over 12 hrs: 93 mL/hr x 1 hr, then 185 mL/hr x 10 hrs, then 93 mL/hr x 1 hr. Electrolytes in TPN Na 35 mEq/L K 60 mEq/L Ca 71mEq/L Mg 5 mEq/L Phos 10 mmol/L Cl:Ac max acetate Add standard MVI and trace elements to TPN Continue sensitive SSI with 3 custom time checks per day based on cyclic TPN.   TPN labs on Mon/Thurs  Dimple Nanas, PharmD 10/23/2021 8:15 AM

## 2021-10-23 NOTE — Progress Notes (Signed)
°   10/23/21 1400  Mobility  Activity Ambulated in hall  Level of Assistance Contact guard assist, steadying assist  Assistive Device Front wheel walker  Distance Ambulated (ft) 300 ft  Mobility Ambulated with assistance in hallway  Mobility Response Tolerated well  Mobility performed by Mobility specialist  $Mobility charge 1 Mobility   Pt agreeable to mobilize this afternoon. Ambulated about 340ft in hall with RW, tolerated well. He stated that his IV was burning, notified RN. Left pt in bed with call bell at side, family and RN present.  [place] Specialist Acute Rehab Services Office: [phone]

## 2021-10-23 NOTE — Progress Notes (Signed)
22 Days Post-Op LOA, SBR and end colostomy creation  Subjective/Chief Complaint: No new issues, NG clamped today.   Vital signs in last 24 hours: Temp:  [97.4 F (36.3 C)-98.5 F (36.9 C)] 97.7 F (36.5 C) (01/05 0616) Pulse Rate:  [85-91] 85 (01/05 0616) Resp:  [14-18] 16 (01/05 0616) BP: (102-120)/(79-88) 102/79 (01/05 0616) SpO2:  [99 %-100 %] 99 % (01/05 0616) Last BM Date: 10/23/21  Intake/Output from previous day: 01/04 0701 - 01/05 0700 In: 1951.2 [P.O.:680; I.V.:657.3; IV Piggyback:613.9] Out: 4765 [Urine:2350; Emesis/NG output:1500; Drains:15; Stool:900] Intake/Output this shift: Total I/O In: -  Out: 150 [Emesis/NG output:150]  General appearance: alert, cooperative, and no distress GI: non-distended, appropriately tender in lower abdomen; ostomy pink with liquid stool, colostomy  viable Incisions OK, JP drain with purulent drainage  Lab Results:  No results for input(s): WBC, HGB, HCT, PLT in the last 72 hours.  BMET Recent Labs    10/22/21 0607 10/23/21 0336  NA 139 137  K 4.6 4.0  CL 105 102  CO2 27 27  GLUCOSE 98 146*  BUN 31* 21  CREATININE 0.55* 0.90  CALCIUM 8.7* 9.2    PT/INR No results for input(s): LABPROT, INR in the last 72 hours. ABG No results for input(s): PHART, HCO3 in the last 72 hours.  Invalid input(s): PCO2, PO2  Studies/Results: No results found.  Anti-infectives: Anti-infectives (From admission, onward)    Start     Dose/Rate Route Frequency Ordered Stop   10/22/21 1200  erythromycin 250 mg in sodium chloride 0.9 % 100 mL IVPB        250 mg 100 mL/hr over 60 Minutes Intravenous Every 6 hours 10/22/21 0841     10/12/21 1000  piperacillin-tazobactam (ZOSYN) IVPB 3.375 g  Status:  Discontinued        3.375 g 12.5 mL/hr over 240 Minutes Intravenous Every 8 hours 10/12/21 0727 10/21/21 0855   10/11/2021 2200  cefoTEtan (CEFOTAN) 1 g in sodium chloride 0.9 % 100 mL IVPB  Status:  Discontinued        1 g 200 mL/hr over 30  Minutes Intravenous Every 12 hours 10/09/2021 1608 10/16/2021 1643   09/29/2021 2200  cefoTEtan (CEFOTAN) 2 g in sodium chloride 0.9 % 100 mL IVPB        2 g 200 mL/hr over 30 Minutes Intravenous  Once 10/08/2021 1644 10/10/2021 2212   09/19/2021 1158  sodium chloride 0.9 % with cefoTEtan (CEFOTAN) ADS Med       Note to Pharmacy: Georgena Spurling W: cabinet override      10/18/2021 1158 10/02/21 0014   09/17/21 1730  piperacillin-tazobactam (ZOSYN) IVPB 3.375 g        3.375 g 12.5 mL/hr over 240 Minutes Intravenous Every 8 hours 09/17/21 1631 09/22/21 1001   09/08/21 1400  cefTRIAXone (ROCEPHIN) 2 g in sodium chloride 0.9 % 100 mL IVPB       Note to Pharmacy: Pharmacy may adjust dosing strength / duration / interval for maximal efficacy   2 g 200 mL/hr over 30 Minutes Intravenous Every 24 hours 09/08/21 1307 09/14/21 1431   08/26/21 1000  metroNIDAZOLE (FLAGYL) IVPB 500 mg  Status:  Discontinued        500 mg 100 mL/hr over 60 Minutes Intravenous Every 12 hours 08/26/21 0830 08/26/21 0910   08/26/21 1000  piperacillin-tazobactam (ZOSYN) IVPB 3.375 g  Status:  Discontinued        3.375 g 12.5 mL/hr over 240 Minutes Intravenous Every 8  hours 08/26/21 0910 09/01/21 0819   08/25/21 1030  metroNIDAZOLE (FLAGYL) tablet 500 mg  Status:  Discontinued        500 mg Per Tube Every 12 hours 08/25/21 0935 08/26/21 0830   08/25/21 1000  cefTRIAXone (ROCEPHIN) 2 g in sodium chloride 0.9 % 100 mL IVPB  Status:  Discontinued       Note to Pharmacy: Pharmacy may adjust dosing strength / duration / interval for maximal efficacy   2 g 200 mL/hr over 30 Minutes Intravenous Every 24 hours 08/25/21 0823 08/26/21 0910   08/25/21 1000  metroNIDAZOLE (FLAGYL) tablet 500 mg  Status:  Discontinued        500 mg Oral Every 12 hours 08/25/21 0837 08/25/21 0935   07/31/2021 2000  cefoTEtan (CEFOTAN) 2 g in sodium chloride 0.9 % 100 mL IVPB        2 g 200 mL/hr over 30 Minutes Intravenous Every 12 hours 08/06/2021 1647 08/17/2021 2046    08/05/2021 0600  cefoTEtan (CEFOTAN) 2 g in sodium chloride 0.9 % 100 mL IVPB        2 g 200 mL/hr over 30 Minutes Intravenous On call to O.R. 07/31/2021 7893 08/08/2021 0806       Assessment/Plan: s/p Procedure(s): LYSIS OF ADHESION (N/A) SMALL BOWEL RESECTION X 2 WITH END COLOSTOMY (N/A)  Severe malnutrition, prolonged ileus: pt not eating well enough to stop TPN, can't tolerate a diet, cont sips and NG for now, will clamp NG again today for 8h and see how he tolerates it  Dehydration: cont MIV   pSBO: due to pelvic adhesions from pelvic abscess.  Pt unable to tolerate NG, IR not able to place, pt unwilling to undergo NG placement in fluoro, IR unable to place decompressive PEG tube.  Pt unable to tolerate a PO diet, taken to OR 12/14 for ex lap.  Several loops of small bowel adherent to completely dehisced anastomosis.  SBR x2 with endo colostomy, pathology c/w carcinomatosis.  Ng output remains high.  D/c Octreotide until better bowel function and pt able to come off TPN.  Will cont IV Erythromycin q6h to encourage motility  Anemia: most likely related to chronic illness and malnutrition.  Iron supplements ordered but pt did not tolerate this.  Iron levels low.  IV iron given in early Dec, tachy overnight, will recheck and give blood as needed  anastomotic dehiscence  - JP drainage catheter appears to be in appropriate position, Zosyn d/c'd 11/14.  wbc normal.  - Rpt CT 11/20 shows increased abscess size with drain in correct position.   restarted drain flushes.  No sign of systemic infection.  Discussed with IR on 11/21.  No further targets available for additional drainage.   7 day course of abx repeated.   - CT 11/28 fluid collection more organized but slightly smaller, IR placed TG drain 11/30, Zosyn x5 days -12/8: GGF enema shows anastomotic leak, controlled with drain - 12/14 complete dehischence of anastomosis found on ex lap.  Colostomy created - 12/22 Abd JP removed CT 12/24 shows  continued pelvic abscess and new RLQ fluid collection.  Will restart Zosyn and have IR eval for drainage tomorrow CT 12/29 shows improving fluid collections and continued ileus  ID: wbc elevated off abx.  Zosyn course completed and wbc normal now  Physical deconditioning: Ambulate in hall TID, appreciate PT/OT involvement (Pt refusing most interventions)  Narcotic dependence: dilaudid prn for pain, will wean slowly, Palliative involved  Urinary retention: foley replaced on  Mon 11/1.  foley out again 11/7, failed voiding trial 11/9 with elevated PVR.   Foley replaced, will need urology f/u as outpatient.   Pt appeared to have a neurogenic bladder during ex lap, foley out, straight cath q8h but pt develpoed increased Cr so foley replaced  Pt with forming carcinomatous on recent exlap and new liver lesions concerning for mets on most recent CT.  Dr Benay Spice and pt aware.  Nothing further to do until recovered from GI standpoint  Dispo: pt has a temporary housing plan in place at discharge in [place] with his Aunt.  TOC team consulted and working with housing authority to assist with long term housing.      LOS: 69 days    Rosario Adie 03/20/1947

## 2021-10-24 LAB — BASIC METABOLIC PANEL
Anion gap: 3 — ABNORMAL LOW (ref 5–15)
BUN: 32 mg/dL — ABNORMAL HIGH (ref 8–23)
CO2: 27 mmol/L (ref 22–32)
Calcium: 8.3 mg/dL — ABNORMAL LOW (ref 8.9–10.3)
Chloride: 103 mmol/L (ref 98–111)
Creatinine, Ser: 0.86 mg/dL (ref 0.61–1.24)
GFR, Estimated: >60 mL/min (ref >60)
Glucose, Bld: 106 mg/dL — ABNORMAL HIGH (ref 70–99)
Potassium: 4.2 mmol/L (ref 3.5–5.1)
Sodium: 133 mmol/L — ABNORMAL LOW (ref 135–145)

## 2021-10-24 LAB — MAGNESIUM: Magnesium: 2.1 mg/dL (ref 1.7–2.4)

## 2021-10-24 LAB — GLUCOSE, CAPILLARY
Glucose-Capillary: 107 mg/dL — ABNORMAL HIGH (ref 70–99)
Glucose-Capillary: 98 mg/dL (ref 70–99)
Glucose-Capillary: 122 mg/dL — ABNORMAL HIGH (ref 70–99)

## 2021-10-24 LAB — PHOSPHORUS: Phosphorus: 2.9 mg/dL (ref 2.5–4.6)

## 2021-10-24 MED ORDER — TRAVASOL 10 % IV SOLN: INTRAVENOUS | Status: DC

## 2021-10-24 MED ORDER — HYDROMORPHONE HCL 1 MG/ML IJ SOLN
1.0000 mg | INTRAMUSCULAR | Status: DC | PRN
  Administered 2021-10-24 – 2021-10-25 (×8): 1 mg via INTRAVENOUS
  Administered 2021-10-25: 2 mg via INTRAVENOUS
  Administered 2021-10-25 – 2021-10-26 (×3): 1 mg via INTRAVENOUS
  Administered 2021-10-26: 2 mg via INTRAVENOUS
  Administered 2021-10-26 (×4): 1 mg via INTRAVENOUS
  Administered 2021-10-26 (×2): 2 mg via INTRAVENOUS
  Administered 2021-10-27 – 2021-10-29 (×15): 1 mg via INTRAVENOUS
  Filled 2021-10-24 (×4): qty 1
  Filled 2021-10-24: qty 2
  Filled 2021-10-24: qty 1
  Filled 2021-10-24: qty 2
  Filled 2021-10-24: qty 1
  Filled 2021-10-24: qty 2
  Filled 2021-10-24 (×2): qty 1
  Filled 2021-10-24: qty 2
  Filled 2021-10-24 (×22): qty 1

## 2021-10-24 MED ORDER — TRAVASOL 10 % IV SOLN
INTRAVENOUS | Status: AC
  Filled 2021-10-24: qty 1101.6

## 2021-10-24 MED ORDER — HYDROMORPHONE HCL 1 MG/ML IJ SOLN: 1.0000 mg | INTRAMUSCULAR | Status: DC | PRN

## 2021-10-24 NOTE — Progress Notes (Signed)
PHARMACY - TOTAL PARENTERAL NUTRITION CONSULT NOTE   Indication: intolerance to enteral feeding  Patient Measurements: Height: 5\' 6"  (167.6 cm) Weight: 60.3 kg (132 lb 15 oz) IBW/kg (Calculated) : 63.8 TPN AdjBW (KG): 60.1 Body mass index is 21.46 kg/m.  Assessment:  66 yo M presents on 10/28 for eval of rectal cancer. S/P LAR and diverting ileostomy. Pharmacy consulted to start TPN 11/5. Stopped TPN on 11/15 but had to restart on 11/17 as patient vomited as is unable to tolerate PO.  1/5: clamped NG tube for 8 hours - per RN patient tolerated and NG tube has currently been clamped for 2 hours this AM  Glucose / Insulin: No Hx DM.  - on sensitive SSI TID (used 1 units in 24 hrs) - CBGs < 150 Electrolytes:   - WNL except for Na slighly low at 133; Phos dec from 4.1 to 2.9; CoCa dec to 9.3 (no Ca in TPN currently) Renal: SCr 0.86, BUN back up to 32 Hepatic: 1/5: Albumin remains low; LFTs and Tbili WNL TG: 12/26: WNL. 1/2: 182  Intake / Output:  - UOP 2000 mL, NG output 600 mL; drain 50 mL, stool 950 mL IVF: None GI Imaging: 11/4 CT abdomen shows fluid collection, SBO, bladder thickening  11/17 KUB: Dilated small bowel loops are noted concerning for distal SBO 11/19 CT:  SBO, increase in size of gas and fluid abscess formation  11/28 CTAP: mild decrease in gas/gjuid in presacral space, no change in SBO 12/5 CTAP:  severe dilatation of small bowel loops in the abdomen concerning for ileus/obstruction 12/7 GGF enema: shows anastomotic leak, controlled with drain 12/24 CT: continued pelvic abscess and new RLQ fluid collection 12/29 CT: improved fluid collections and continued ileus 1/2 [place]: NG tube tip looped within gastric fundus, suggestive of partial SBO similar to that noted on CT 12/29 GI Surgeries / Procedures:  10/28: LAR/Diverting ileostomy  11/30: Successful CT-guided presacral pelvic abscess drain placement 12/14: Small bowel resection x 2 with end colostomy, LOA; NGT  placed for decompression  1/4: noted that IR is planning drain exchange for 1/5  Central access: Implanted Port TPN start date: 11/5 >> 11/15. Resumed 11/17>>  Nutritional Goals:  Cyclic TPN:  8916 mL provides 110 g of protein and 1984 kcals per day  RD Assessment: Estimated Needs Total Energy Estimated Needs: 1950-2150 Total Protein Estimated Needs: 100-110g Total Fluid Estimated Needs: 2L/day  Current Nutrition:  NPO TPN transitioned to 12 hour cyclic (94/50)  Plan:   At 3888: Continue cyclic TPN. Infuse 2,040 mL over 12 hrs: 93 mL/hr x 1 hr, then 185 mL/hr x 10 hrs, then 93 mL/hr x 1 hr. Electrolytes in TPN Na 50 mEq/L  K 60 mEq/L Ca 3 mEq/L Mg 5 mEq/L Phos 15 mmol/L Cl:Ac max acetate Add standard MVI and trace elements to TPN Continue sensitive SSI with 3 custom time checks per day based on cyclic TPN.   TPN labs on Mon/Thurs F/u ability to resume diet and wean TPN  Dimple Nanas, PharmD 10/24/2021 7:50 AM

## 2021-10-24 NOTE — Progress Notes (Signed)
23 Days Post-Op LOA, SBR and end colostomy creation  Subjective/Chief Complaint: No new issues, NG clamped today. Ambulated in hall yesterday.  IR drain advanced yesterday   Vital signs in last 24 hours: Temp:  [98.5 F (36.9 C)-99 F (37.2 C)] 98.5 F (36.9 C) (01/06 0515) Pulse Rate:  [89-95] 92 (01/06 0515) Resp:  [14-16] 16 (01/06 0515) BP: (103-113)/(69-80) 103/69 (01/06 0515) SpO2:  [98 %] 98 % (01/06 0515) Last BM Date: 10/23/21  Intake/Output from previous day: 01/05 0701 - 01/06 0700 In: 811.1 [P.O.:240; IV Piggyback:571.1] Out: 3615 [Urine:2015; Emesis/NG output:600; Drains:50; Stool:950] Intake/Output this shift: No intake/output data recorded.  General appearance: alert, cooperative, and no distress GI: non-distended, appropriately tender in lower abdomen; ostomy pink with liquid stool, colostomy  viable Incisions OK, JP drain with purulent drainage  Lab Results:  No results for input(s): WBC, HGB, HCT, PLT in the last 72 hours.  BMET Recent Labs    10/23/21 0336 10/24/21 0652  NA 137 133*  K 4.0 4.2  CL 102 103  CO2 27 27  GLUCOSE 146* 106*  BUN 21 32*  CREATININE 0.90 0.86  CALCIUM 9.2 8.3*    PT/INR No results for input(s): LABPROT, INR in the last 72 hours. ABG No results for input(s): PHART, HCO3 in the last 72 hours.  Invalid input(s): PCO2, PO2  Studies/Results: IR Catheter Tube Change  Result Date: 10/23/2021 INDICATION: 66 year old male with a history of rectal cancer status post low anterior resection and diverting ileostomy. Unfortunately, patient has a chronic presacral abscess and his drainage catheter is partially displaced. He presents for catheter exchange and repositioning. EXAM: Drain exchange MEDICATIONS: The patient is currently admitted to the hospital and receiving intravenous antibiotics. The antibiotics were administered within an appropriate time frame prior to the initiation of the procedure. ANESTHESIA/SEDATION: None.  COMPLICATIONS: None immediate. PROCEDURE: Informed written consent was obtained from the patient after a thorough discussion of the procedural risks, benefits and alternatives. All questions were addressed. Maximal Sterile Barrier Technique was utilized including caps, mask, sterile gowns, sterile gloves, sterile drape, hand hygiene and skin antiseptic. A timeout was performed prior to the initiation of the procedure. Contrast was injected through the existing catheter. The catheter at the margin of a large presacral fluid collection which was opacified with contrast. The existing catheter was transected and removed over a Bentson wire. The Bentson wire was navigated into the fluid collection. A new 10.2 [place] all-purpose drainage catheter was advanced over the wire and formed. Aspiration was performed yielding approximately 30 mL of purulent fluid and debris. The drainage catheter was secured in place with 0 silk suture and reconnected to JP bulb suction. IMPRESSION: Successful exchange and repositioning of transgluteal presacral drainage catheter. Approximately 30 mL of purulent fluid and debris was successfully aspirated. Electronically Signed   By: Jacqulynn Cadet M.D.   On: 10/23/2021 17:07    Anti-infectives: Anti-infectives (From admission, onward)    Start     Dose/Rate Route Frequency Ordered Stop   10/22/21 1200  erythromycin 250 mg in sodium chloride 0.9 % 100 mL IVPB        250 mg 100 mL/hr over 60 Minutes Intravenous Every 6 hours 10/22/21 0841     10/12/21 1000  piperacillin-tazobactam (ZOSYN) IVPB 3.375 g  Status:  Discontinued        3.375 g 12.5 mL/hr over 240 Minutes Intravenous Every 8 hours 10/12/21 0727 10/21/21 0855   10/16/2021 2200  cefoTEtan (CEFOTAN) 1 g in sodium chloride  0.9 % 100 mL IVPB  Status:  Discontinued        1 g 200 mL/hr over 30 Minutes Intravenous Every 12 hours 10/14/2021 1608 10/12/2021 1643   10/16/2021 2200  cefoTEtan (CEFOTAN) 2 g in sodium chloride 0.9 % 100 mL  IVPB        2 g 200 mL/hr over 30 Minutes Intravenous  Once 10/17/2021 1644 09/24/2021 2212   10/09/2021 1158  sodium chloride 0.9 % with cefoTEtan (CEFOTAN) ADS Med       Note to Pharmacy: Georgena Spurling W: cabinet override      10/18/2021 1158 10/02/21 0014   09/17/21 1730  piperacillin-tazobactam (ZOSYN) IVPB 3.375 g        3.375 g 12.5 mL/hr over 240 Minutes Intravenous Every 8 hours 09/17/21 1631 09/22/21 1001   09/08/21 1400  cefTRIAXone (ROCEPHIN) 2 g in sodium chloride 0.9 % 100 mL IVPB       Note to Pharmacy: Pharmacy may adjust dosing strength / duration / interval for maximal efficacy   2 g 200 mL/hr over 30 Minutes Intravenous Every 24 hours 09/08/21 1307 09/14/21 1431   08/26/21 1000  metroNIDAZOLE (FLAGYL) IVPB 500 mg  Status:  Discontinued        500 mg 100 mL/hr over 60 Minutes Intravenous Every 12 hours 08/26/21 0830 08/26/21 0910   08/26/21 1000  piperacillin-tazobactam (ZOSYN) IVPB 3.375 g  Status:  Discontinued        3.375 g 12.5 mL/hr over 240 Minutes Intravenous Every 8 hours 08/26/21 0910 09/01/21 0819   08/25/21 1030  metroNIDAZOLE (FLAGYL) tablet 500 mg  Status:  Discontinued        500 mg Per Tube Every 12 hours 08/25/21 0935 08/26/21 0830   08/25/21 1000  cefTRIAXone (ROCEPHIN) 2 g in sodium chloride 0.9 % 100 mL IVPB  Status:  Discontinued       Note to Pharmacy: Pharmacy may adjust dosing strength / duration / interval for maximal efficacy   2 g 200 mL/hr over 30 Minutes Intravenous Every 24 hours 08/25/21 0823 08/26/21 0910   08/25/21 1000  metroNIDAZOLE (FLAGYL) tablet 500 mg  Status:  Discontinued        500 mg Oral Every 12 hours 08/25/21 0837 08/25/21 0935   07/24/2021 2000  cefoTEtan (CEFOTAN) 2 g in sodium chloride 0.9 % 100 mL IVPB        2 g 200 mL/hr over 30 Minutes Intravenous Every 12 hours 08/04/2021 1647 08/02/2021 2046   07/25/2021 0600  cefoTEtan (CEFOTAN) 2 g in sodium chloride 0.9 % 100 mL IVPB        2 g 200 mL/hr over 30 Minutes Intravenous On call to  O.R. 08/18/2021 4403 08/10/2021 0806       Assessment/Plan: s/p Procedure(s): LYSIS OF ADHESION (N/A) SMALL BOWEL RESECTION X 2 WITH END COLOSTOMY (N/A)  Severe malnutrition, prolonged ileus: pt not eating well enough to stop TPN, can't tolerate a diet, cont sips and NG for now, will clamp NG again today.  Return to [facility] if nauseated  Dehydration: cont MIV   pSBO: due to pelvic adhesions from pelvic abscess.  Pt unable to tolerate NG, IR not able to place, pt unwilling to undergo NG placement in fluoro, IR unable to place decompressive PEG tube.  Pt unable to tolerate a PO diet, taken to OR 12/14 for ex lap.  Several loops of small bowel adherent to completely dehisced anastomosis.  SBR x2 with endo colostomy, pathology c/w carcinomatosis.  Ng output remains high.  D/c Octreotide until better bowel function and pt able to come off TPN.  Will cont IV Erythromycin q6h to encourage motility  Anemia: most likely related to chronic illness and malnutrition.  Iron supplements ordered but pt did not tolerate this.  Iron levels low.  IV iron given in early Dec, tachy overnight, will recheck and give blood as needed  anastomotic dehiscence  - JP drainage catheter appears to be in appropriate position, Zosyn d/c'd 11/14.  wbc normal.  - Rpt CT 11/20 shows increased abscess size with drain in correct position.   restarted drain flushes.  No sign of systemic infection.  Discussed with IR on 11/21.  No further targets available for additional drainage.   7 day course of abx repeated.   - CT 11/28 fluid collection more organized but slightly smaller, IR placed TG drain 11/30, Zosyn x5 days -12/8: GGF enema shows anastomotic leak, controlled with drain - 12/14 complete dehischence of anastomosis found on ex lap.  Colostomy created - 12/22 Abd JP removed CT 12/24 shows continued pelvic abscess and new RLQ fluid collection.  Will restart Zosyn and have IR eval for drainage tomorrow CT 12/29 shows improving fluid  collections and continued ileus  ID: wbc elevated off abx.  Zosyn course completed and wbc normal now 1/5: IR drain advanced with good purulent output  Physical deconditioning: Ambulate in hall TID, appreciate PT/OT involvement (Pt refusing less interventions now)  Narcotic dependence: dilaudid prn for pain, will wean slowly, Palliative involved  Urinary retention: foley replaced on Mon 11/1.  foley out again 11/7, failed voiding trial 11/9 with elevated PVR.   Foley replaced, will need urology f/u as outpatient.   Pt appeared to have a neurogenic bladder during ex lap, foley out, straight cath q8h but pt develpoed increased Cr so foley replaced  Pt with forming carcinomatous on recent exlap and new liver lesions concerning for mets on most recent CT.  Dr Benay Spice and pt aware.  Nothing further to do until recovered from GI standpoint  Dispo: pt has a temporary housing plan in place at discharge in [place] with his Aunt.  TOC team consulted and working with housing authority to assist with long term housing.      LOS: 70 days    Phillip [place] 3/0/0762

## 2021-10-24 NOTE — Progress Notes (Signed)
Mobility Specialist - Progress Note    10/24/21 1208  Oxygen Therapy  O2 Device Room Air  Mobility  Activity Ambulated in hall  Level of Assistance Standby assist, set-up cues, supervision of patient - no hands on  Assistive Device Front wheel walker  Distance Ambulated (ft) 300 ft  Mobility Ambulated with assistance in hallway  Mobility Response Tolerated well  Mobility performed by Mobility specialist  $Mobility charge 1 Mobility   Pt pleasant today and agreeable to mobilize upon entry. NT disconnected NGT from suction. Pt then used RW to ambulate 300 ft in hallway. No complaints made during session. Pt returned to room after session and was left sitting up in bed with call bell at side and family in room. NGT to be reconnected by RN.   [place] Specialist Acute Rehabilitation Services Phone: [phone] 10/24/21, 12:12 PM

## 2021-10-24 NOTE — Progress Notes (Signed)
Referring Physician(s): CCS  Supervising Physician: Corrie Mckusick  Patient Status:  [facility] - In-pt  Chief Complaint:  S/p left TG drain placement on 11/30, exchanged and repositioned on 10/23/21   Subjective:  Pt laying in bed sleeping, easily arousable with verbal stimuli.  Reports no complaints regarding the drain.   Allergies: Patient has no known allergies.  Medications: Prior to Admission medications   Medication Sig Start Date End Date Taking? Authorizing Provider  acetaminophen (TYLENOL) 650 MG CR tablet Take 650 mg by mouth every 8 (eight) hours as needed for pain.   Yes [provider]  oxyCODONE (OXY IR/ROXICODONE) 5 MG immediate release tablet Take 1 tablet (5 mg total) by mouth every 6 (six) hours as needed for severe pain. 08/11/21  Yes Owens Shark, NP  traMADol (ULTRAM) 50 MG tablet Take 50 mg by mouth every 12 (twelve) hours as needed for moderate pain. 06/25/21   [provider]     Vital Signs: BP 103/69 (BP Location: Left Arm)    Pulse 92    Temp 98.5 F (36.9 C) (Oral)    Resp 16    Ht 5\' 6"  (1.676 m)    Wt 132 lb 15 oz (60.3 kg)    SpO2 98%    BMI 21.46 kg/m   Physical Exam Vitals reviewed.  Constitutional:      General: He is not in acute distress.    Appearance: He is ill-appearing.     Comments: Sleeping   HENT:     Head: Normocephalic and atraumatic.  Pulmonary:     Effort: Pulmonary effort is normal.  Skin:    General: Skin is warm and dry.     Coloration: Skin is not jaundiced or pale.     Comments: Positive L TG drain to a suction bulb. Patient declined site check. Dressing is clean, dry, and intact. Trace of  serosanguinous fluid noted in the bulb.   Neurological:     Mental Status: He is oriented to person, place, and time.  Psychiatric:        Behavior: Behavior normal.    Imaging: IR Catheter Tube Change  Result Date: 10/23/2021 INDICATION: 66 year old male with a history of rectal cancer status post low anterior  resection and diverting ileostomy. Unfortunately, patient has a chronic presacral abscess and his drainage catheter is partially displaced. He presents for catheter exchange and repositioning. EXAM: Drain exchange MEDICATIONS: The patient is currently admitted to the hospital and receiving intravenous antibiotics. The antibiotics were administered within an appropriate time frame prior to the initiation of the procedure. ANESTHESIA/SEDATION: None. COMPLICATIONS: None immediate. PROCEDURE: Informed written consent was obtained from the patient after a thorough discussion of the procedural risks, benefits and alternatives. All questions were addressed. Maximal Sterile Barrier Technique was utilized including caps, mask, sterile gowns, sterile gloves, sterile drape, hand hygiene and skin antiseptic. A timeout was performed prior to the initiation of the procedure. Contrast was injected through the existing catheter. The catheter at the margin of a large presacral fluid collection which was opacified with contrast. The existing catheter was transected and removed over a Bentson wire. The Bentson wire was navigated into the fluid collection. A new 10.2 [place] all-purpose drainage catheter was advanced over the wire and formed. Aspiration was performed yielding approximately 30 mL of purulent fluid and debris. The drainage catheter was secured in place with 0 silk suture and reconnected to JP bulb suction. IMPRESSION: Successful exchange and repositioning of transgluteal presacral drainage  catheter. Approximately 30 mL of purulent fluid and debris was successfully aspirated. Electronically Signed   By: Jacqulynn Cadet M.D.   On: 10/23/2021 17:07    Labs:  CBC: Recent Labs    10/13/21 0623 10/16/21 0555 10/17/21 0621 10/20/21 0039  WBC 15.6* 10.7* 10.5 8.6  HGB 7.5* 7.0* 8.2* 8.3*  HCT 25.8* 24.8* 26.9* 27.0*  PLT 572* 603* 530* 500*    COAGS: Recent Labs    12/06/20 1706  INR 1.1*    BMP: Recent  Labs    10/21/21 0849 10/22/21 0607 10/23/21 0336 10/24/21 0652  NA 141 139 137 133*  K 4.2 4.6 4.0 4.2  CL 110 105 102 103  CO2 26 27 27 27   GLUCOSE 111* 98 146* 106*  BUN 31* 31* 21 32*  CALCIUM 8.6* 8.7* 9.2 8.3*  CREATININE 0.81 0.55* 0.90 0.86  GFRNONAA >60 >60 >60 >60    LIVER FUNCTION TESTS: Recent Labs    10/13/21 0623 10/16/21 0555 10/20/21 0039 10/23/21 0336  BILITOT 0.4 0.3 0.4 0.5  AST 19 17 15 15   ALT 42 22 17 13   ALKPHOS 67 64 61 67  PROT 7.4 7.7 6.6 7.4  ALBUMIN 2.2* 2.5* 2.3* 2.7*    Assessment and Plan:  66 y.o. male with  rectal CA s/p LAR, diverting ostomy on 54/00 complicated by intraabdominal fluid collection s/p Left TG approach drain placement by IR on 11/30. Further complicated by anastomotic leak seen on BE 12/8. Also s/p lysis of adhesion and small bowel resection with end colostomy on 12/14. Repeat CT on 12/24 showed new loculated collection of fluid and gas in the anterior right lower quadrant, not amendable for percutaneous drainage. CT on 12/29 showed drain only partially within the presacral fluid collection, the drain was repositioned on 10/23/21.   Drain Location: Left TG Size: Fr size: 10 Fr Date of placement: original placement 11/30, exchanged 10/21/21 Currently to: Drain collection device: suction bulb 24 hour output:  Output by Drain (mL) 10/22/21 0701 - 10/22/21 1900 10/22/21 1901 - 10/23/21 0700 10/23/21 0701 - 10/23/21 1900 10/23/21 1901 - 10/24/21 0700 10/24/21 0701 - 10/24/21 1324  Closed System Drain Left Buttock Bulb (JP) 10.2 Fr.   10 20 0    Interval imaging/drain manipulation:  12/05: CT abd 12/08: BE - showed anastomotic leak 12/24: CT a/p  Interval development of increasing gas and debris in the central pelvis, cranial to the presacral abscess in the region of the peritoneal reflection. New loculated collection of fluid and gas in the anterior right lower quadrant just deep to the rectus sheath measuring 9.4 x 2.2  cm and compatible with abscess. - Reviewed by Dr. Anselm Pancoast, nPt refused RLQ drain placement  12/29: CT a/p   Persisting fairly diffuse small bowel dilatation except in the right lower quadrant extending up to the right lower quadrant Ileostomy   reviewed by Dr. Vernard Gambles, no window for drain placement  Very little change in the presacral abscess today but the pigtail segment of the drainage catheter today is only partially within the collection and repositioning may be warranted. 10/19/21: acute abd series - showed partial SBO 1/2: abd xray for NGT placement, NGT in good position  1/5: L TG drain exchanged and repositioned   Current examination: Dressed appropriately.   Plan: Continue TID flushes with 5 cc NS. Record output Q shift. Dressing changes QD or PRN if soiled.  Call IR APP or on call IR MD if difficulty flushing or sudden change in  drain output.  Repeat imaging/possible drain injection once output < 10 mL/QD (excluding flush material.)  Discharge planning: Please contact IR APP or on call IR MD prior to patient d/c to ensure appropriate follow up plans are in place. Typically patient will follow up with IR clinic 10-14 days post d/c for repeat imaging/possible drain injection. IR scheduler will contact patient with date/time of appointment. Patient will need to flush drain QD with 5 cc NS, record output QD, dressing changes every 2-3 days or earlier if soiled.   IR will continue to follow - please call with questions or concerns.   Electronically Signed: Tera Mater, PA-C 10/24/2021, 1:22 PM   I spent a total of 15 Minutes at the the patient's bedside AND on the patient's hospital floor or unit, greater than 50% of which was counseling/coordinating care for L TG drain.  This chart was dictated using voice recognition software.  Despite best efforts to proofread,  errors can occur which can change the documentation meaning.

## 2021-10-25 ENCOUNTER — Other Ambulatory Visit (HOSPITAL_COMMUNITY): Payer: Self-pay

## 2021-10-25 LAB — GLUCOSE, CAPILLARY
Glucose-Capillary: 107 mg/dL — ABNORMAL HIGH (ref 70–99)
Glucose-Capillary: 99 mg/dL (ref 70–99)
Glucose-Capillary: 134 mg/dL — ABNORMAL HIGH (ref 70–99)

## 2021-10-25 MED ORDER — TRAVASOL 10 % IV SOLN
INTRAVENOUS | Status: AC
  Filled 2021-10-25: qty 1101.6

## 2021-10-25 NOTE — Progress Notes (Signed)
Assessment & Plan: s/p Procedure(s): LYSIS OF ADHESION (N/A) SMALL BOWEL RESECTION X 2 WITH END COLOSTOMY (N/A)   Severe malnutrition, prolonged ileus: emesis overnight >500 cc's.  NG replaced. NPO.   Dehydration: cont MIV    pSBO: due to pelvic adhesions from pelvic abscess.  Pt unable to tolerate NG, IR not able to place, pt unwilling to undergo NG placement in fluoro, IR unable to place decompressive PEG tube.  Pt unable to tolerate a PO diet, taken to OR 12/14 for ex lap.  Several loops of small bowel adherent to completely dehisced anastomosis.  SBR x2 with endo colostomy, pathology c/w carcinomatosis.  Ng output remains high.  D/c Octreotide until better bowel function and pt able to come off TPN.  Will cont IV Erythromycin q6h to encourage motility   Anemia: most likely related to chronic illness and malnutrition.  Iron supplements ordered but pt did not tolerate this.  Iron levels low.  IV iron given in early Dec, tachy overnight, will recheck and give blood as needed   anastomotic dehiscence  - JP drainage catheter appears to be in appropriate position, Zosyn d/c'd 11/14.  wbc normal.  - Rpt CT 11/20 shows increased abscess size with drain in correct position.   restarted drain flushes.  No sign of systemic infection.  Discussed with IR on 11/21.  No further targets available for additional drainage.   7 day course of abx repeated.   - CT 11/28 fluid collection more organized but slightly smaller, IR placed TG drain 11/30, Zosyn x5 days -12/8: GGF enema shows anastomotic leak, controlled with drain - 12/14 complete dehischence of anastomosis found on ex lap.  Colostomy created - 12/22 Abd JP removed CT 12/24 shows continued pelvic abscess and new RLQ fluid collection.  Will restart Zosyn and have IR eval for drainage tomorrow CT 12/29 shows improving fluid collections and continued ileus   ID: wbc elevated off abx.  Zosyn course completed and wbc normal now 1/5: IR drain  advanced with good purulent output   Physical deconditioning: Ambulate in hall TID, appreciate PT/OT involvement (Pt refusing less interventions now)   Narcotic dependence: dilaudid prn for pain, will wean slowly, Palliative involved   Urinary retention: foley replaced on Mon 11/1.  foley out again 11/7, failed voiding trial 11/9 with elevated PVR.   Foley replaced, will need urology f/u as outpatient.   Pt appeared to have a neurogenic bladder during ex lap, foley out, straight cath q8h but pt develpoed increased Cr so foley replaced   Pt with forming carcinomatous on recent exlap and new liver lesions concerning for mets on most recent CT.  Dr Benay Spice and pt aware.  Nothing further to do until recovered from GI standpoint   Dispo: pt has a temporary housing plan in place at discharge in [place] with his Aunt.  TOC team consulted and working with housing authority to assist with long term housing.            [name], MD       [facility]       Office: [phone]   Subjective: Patient in bed, emesis last night.  NG replaced and on suction with large volume output.  Objective: Vital signs in last 24 hours: Temp:  [97.9 F (36.6 C)-98.6 F (37 C)] 97.9 F (36.6 C) (01/07 0500) Pulse Rate:  [91-101] 101 (01/07 0500) Resp:  [18] 18 (01/07 0500) BP: (98-116)/(62-83) 113/77 (01/07 0500) SpO2:  [97 %-99 %]  97 % (01/07 0500) Last BM Date: 10/25/21  Intake/Output from previous day: 01/06 0701 - 01/07 0700 In: 2263.9 [P.O.:150; I.V.:1434.4; IV Piggyback:679.5] Out: 6256 [Urine:3525; Drains:95; Stool:800; Blood:500] Intake/Output this shift: No intake/output data recorded.  Physical Exam: HEENT - sclerae clear, mucous membranes moist Neck - soft Abdomen - soft; small thin succus at ostomy; drain with thin serous output; large bilious NG output Ext - no edema, non-tender Neuro - alert & oriented, no focal deficits  Lab Results:  No results for input(s):  WBC, HGB, HCT, PLT in the last 72 hours. BMET Recent Labs    10/23/21 0336 10/24/21 0652  NA 137 133*  K 4.0 4.2  CL 102 103  CO2 27 27  GLUCOSE 146* 106*  BUN 21 32*  CREATININE 0.90 0.86  CALCIUM 9.2 8.3*   PT/INR No results for input(s): LABPROT, INR in the last 72 hours. Comprehensive Metabolic Panel:    Component Value Date/Time   NA 133 (L) 10/24/2021 0652   NA 137 10/23/2021 0336   K 4.2 10/24/2021 0652   K 4.0 10/23/2021 0336   CL 103 10/24/2021 0652   CL 102 10/23/2021 0336   CO2 27 10/24/2021 0652   CO2 27 10/23/2021 0336   BUN 32 (H) 10/24/2021 0652   BUN 21 10/23/2021 0336   CREATININE 0.86 10/24/2021 0652   CREATININE 0.90 10/23/2021 0336   CREATININE 0.79 06/09/2021 1020   CREATININE 0.86 05/07/2021 0826   GLUCOSE 106 (H) 10/24/2021 0652   GLUCOSE 146 (H) 10/23/2021 0336   CALCIUM 8.3 (L) 10/24/2021 0652   CALCIUM 9.2 10/23/2021 0336   AST 15 10/23/2021 0336   AST 15 10/20/2021 0039   AST 11 (L) 06/09/2021 1020   AST 16 05/07/2021 0826   ALT 13 10/23/2021 0336   ALT 17 10/20/2021 0039   ALT 5 06/09/2021 1020   ALT 7 05/07/2021 0826   ALKPHOS 67 10/23/2021 0336   ALKPHOS 61 10/20/2021 0039   BILITOT 0.5 10/23/2021 0336   BILITOT 0.4 10/20/2021 0039   BILITOT 0.3 06/09/2021 1020   BILITOT 0.3 05/07/2021 0826   PROT 7.4 10/23/2021 0336   PROT 6.6 10/20/2021 0039   ALBUMIN 2.7 (L) 10/23/2021 0336   ALBUMIN 2.3 (L) 10/20/2021 0039    Studies/Results: IR Catheter Tube Change  Result Date: 10/23/2021 INDICATION: 66 year old male with a history of rectal cancer status post low anterior resection and diverting ileostomy. Unfortunately, patient has a chronic presacral abscess and his drainage catheter is partially displaced. He presents for catheter exchange and repositioning. EXAM: Drain exchange MEDICATIONS: The patient is currently admitted to the hospital and receiving intravenous antibiotics. The antibiotics were administered within an appropriate  time frame prior to the initiation of the procedure. ANESTHESIA/SEDATION: None. COMPLICATIONS: None immediate. PROCEDURE: Informed written consent was obtained from the patient after a thorough discussion of the procedural risks, benefits and alternatives. All questions were addressed. Maximal Sterile Barrier Technique was utilized including caps, mask, sterile gowns, sterile gloves, sterile drape, hand hygiene and skin antiseptic. A timeout was performed prior to the initiation of the procedure. Contrast was injected through the existing catheter. The catheter at the margin of a large presacral fluid collection which was opacified with contrast. The existing catheter was transected and removed over a Bentson wire. The Bentson wire was navigated into the fluid collection. A new 10.2 [place] all-purpose drainage catheter was advanced over the wire and formed. Aspiration was performed yielding approximately 30 mL of purulent fluid and debris. The  drainage catheter was secured in place with 0 silk suture and reconnected to JP bulb suction. IMPRESSION: Successful exchange and repositioning of transgluteal presacral drainage catheter. Approximately 30 mL of purulent fluid and debris was successfully aspirated. Electronically Signed   By: Jacqulynn Cadet M.D.   On: 10/23/2021 17:07      [name] 10/25/2021   Patient ID: Phillip Brooks, male   DOB: 03/17/1956, 66 y.o.   MRN: [id number]

## 2021-10-25 NOTE — Plan of Care (Signed)
  Problem: Coping: Goal: Level of anxiety will decrease Outcome: Progressing   Problem: Pain Managment: Goal: General experience of comfort will improve Outcome: Progressing   Problem: Safety: Goal: Ability to remain free from injury will improve Outcome: Progressing   

## 2021-10-25 NOTE — Progress Notes (Signed)
PHARMACY - TOTAL PARENTERAL NUTRITION CONSULT NOTE   Indication: intolerance to enteral feeding  Patient Measurements: Height: 5\' 6"  (167.6 cm) Weight: 60.3 kg (132 lb 15 oz) IBW/kg (Calculated) : 63.8 TPN AdjBW (KG): 60.1 Body mass index is 21.46 kg/m.  Assessment:  66 yo M presents on 10/28 for eval of rectal cancer. S/P LAR and diverting ileostomy. Pharmacy consulted to start TPN 11/5. Stopped TPN on 11/15 but had to restart on 11/17 as patient vomited as is unable to tolerate PO.  1/5: clamped NG tube for 8 hours - per RN patient tolerated  1/6: NGT clamped 1/7: Emesis overnight >500 mL. NG hooked back up to suction  Glucose / Insulin: No Hx DM.  - on sensitive SSI TID (none given in 24 hrs) - CBGs < 150 are at goal Electrolytes:  No labs this morning. Last labs 1/6 - WNL except for Na slighly low at 133 Renal: SCr 0.86 - WNL, BUN  32 - slightly elevated Hepatic: 1/5: Albumin remains low; LFTs and Tbili WNL TG: 12/26: WNL. 1/2: 182 slightly elevated Intake / Output:  - UOP 3525 mL, NG was clamped yesterday - reconnected to suction this morning; drain 95 mL, stool 800 mL IVF: None GI Imaging: 11/4 CT abdomen shows fluid collection, SBO, bladder thickening  11/17 KUB: Dilated small bowel loops are noted concerning for distal SBO 11/19 CT:  SBO, increase in size of gas and fluid abscess formation  11/28 CTAP: mild decrease in gas/gjuid in presacral space, no change in SBO 12/5 CTAP:  severe dilatation of small bowel loops in the abdomen concerning for ileus/obstruction 12/7 GGF enema: shows anastomotic leak, controlled with drain 12/24 CT: continued pelvic abscess and new RLQ fluid collection 12/29 CT: improved fluid collections and continued ileus 1/2 [place]: NG tube tip looped within gastric fundus, suggestive of partial SBO similar to that noted on CT 12/29 GI Surgeries / Procedures:  10/28: LAR/Diverting ileostomy  11/30: Successful CT-guided presacral pelvic abscess drain  placement 12/14: Small bowel resection x 2 with end colostomy, LOA; NGT placed for decompression  1/5: IR exchanged/repositioned transgluteal presacral drainage catheter  Central access: Implanted Port TPN start date: 11/5 >> 11/15. Resumed 11/17>>  Nutritional Goals:  Cyclic TPN:  0240 mL provides 110 g of protein and 1984 kcals per day  RD Assessment: Estimated Needs Total Energy Estimated Needs: 1950-2150 Total Protein Estimated Needs: 100-110g Total Fluid Estimated Needs: 2L/day  Current Nutrition:  NPO TPN - 12 hour cyclic (97/35)  Plan:   At 3299: Continue cyclic TPN. Infuse 2,040 mL over 12 hrs: 93 mL/hr x 1 hr, then 185 mL/hr x 10 hrs, then 93 mL/hr x 1 hr. Electrolytes in TPN. No change Na 50 mEq/L  K 60 mEq/L Ca 3 mEq/L Mg 5 mEq/L Phos 15 mmol/L Cl:Ac max acetate Add standard MVI and trace elements to TPN Continue sensitive SSI with 3 custom time checks per day based on cyclic TPN.   TPN labs on Mon/Thurs. Recheck electrolytes with AM labs tomorrow.  Lenis Noon, PharmD 10/25/21 9:33 AM

## 2021-10-25 NOTE — Progress Notes (Signed)
Patient woke up from sleep and vomited 500 cc's of green bile emesis, bath given and full bed change done, NG tube hooked back up to low intermittent suction, an additional 200 cc's of green liquid came out immediately.

## 2021-10-26 LAB — PHOSPHORUS: Phosphorus: 4.1 mg/dL (ref 2.5–4.6)

## 2021-10-26 LAB — BASIC METABOLIC PANEL
Anion gap: 9 (ref 5–15)
BUN: 35 mg/dL — ABNORMAL HIGH (ref 8–23)
CO2: 30 mmol/L (ref 22–32)
Calcium: 8.9 mg/dL (ref 8.9–10.3)
Chloride: 98 mmol/L (ref 98–111)
Creatinine, Ser: 0.88 mg/dL (ref 0.61–1.24)
GFR, Estimated: >60 mL/min (ref >60)
Glucose, Bld: 127 mg/dL — ABNORMAL HIGH (ref 70–99)
Potassium: 4.2 mmol/L (ref 3.5–5.1)
Sodium: 137 mmol/L (ref 135–145)

## 2021-10-26 LAB — GLUCOSE, CAPILLARY
Glucose-Capillary: 82 mg/dL (ref 70–99)
Glucose-Capillary: 111 mg/dL — ABNORMAL HIGH (ref 70–99)

## 2021-10-26 LAB — MAGNESIUM: Magnesium: 2 mg/dL (ref 1.7–2.4)

## 2021-10-26 MED ORDER — TRAVASOL 10 % IV SOLN
INTRAVENOUS | Status: DC
  Filled 2021-10-26: qty 1101.6

## 2021-10-26 MED ORDER — TRAVASOL 10 % IV SOLN
INTRAVENOUS | Status: AC
  Filled 2021-10-26: qty 1101.6

## 2021-10-26 NOTE — Plan of Care (Signed)
  Problem: Education: Goal: Knowledge of General Education information will improve Description Including pain rating scale, medication(s)/side effects and non-pharmacologic comfort measures Outcome: Progressing   Problem: Health Behavior/Discharge Planning: Goal: Ability to manage health-related needs will improve Outcome: Progressing   

## 2021-10-26 NOTE — Progress Notes (Signed)
PHARMACY - TOTAL PARENTERAL NUTRITION CONSULT NOTE   Indication: intolerance to enteral feeding  Patient Measurements: Height: 5\' 6"  (167.6 cm) Weight: 60.3 kg (132 lb 15 oz) IBW/kg (Calculated) : 63.8 TPN AdjBW (KG): 60.1 Body mass index is 21.46 kg/m.  Assessment:  66 yo M presents on 10/28 for eval of rectal cancer. S/P LAR and diverting ileostomy. Pharmacy consulted to start TPN 11/5. Stopped TPN on 11/15 but had to restart on 11/17 as patient vomited as is unable to tolerate PO.  1/5: clamped NG tube for 8 hours - per RN patient tolerated  1/6: NGT clamped 1/7: Emesis overnight >500 mL. NG hooked back up to suction  Glucose / Insulin: No Hx DM.  - on sensitive SSI TID (none given in 72 hrs) - CBGs < 150 are at goal ranging from 82 - 134 Electrolytes:  All lytes WNL including CorrCa (9.9) Renal: SCr WNL, BUN  35 - slightly elevated and trending up Hepatic: 1/5: Albumin remains low; LFTs and Tbili WNL TG: 12/26: WNL. 1/2: 182 slightly elevated Intake / Output:  - UOP 2300 mL, NGT: 1900 mL; drain 25 mL - decreasing, stool: none IVF: None GI Imaging: 11/4 CT abdomen shows fluid collection, SBO, bladder thickening  11/17 KUB: Dilated small bowel loops are noted concerning for distal SBO 11/19 CT:  SBO, increase in size of gas and fluid abscess formation  11/28 CTAP: mild decrease in gas/gjuid in presacral space, no change in SBO 12/5 CTAP:  severe dilatation of small bowel loops in the abdomen concerning for ileus/obstruction 12/7 GGF enema: shows anastomotic leak, controlled with drain 12/24 CT: continued pelvic abscess and new RLQ fluid collection 12/29 CT: improved fluid collections and continued ileus 1/2 [place]: NG tube tip looped within gastric fundus, suggestive of partial SBO similar to that noted on CT 12/29 GI Surgeries / Procedures:  10/28: LAR/Diverting ileostomy  11/30: Successful CT-guided presacral pelvic abscess drain placement 12/14: Small bowel resection x 2  with end colostomy, LOA; NGT placed for decompression  1/5: IR exchanged/repositioned transgluteal presacral drainage catheter  Central access: Implanted Port TPN start date: 11/5 >> 11/15. Resumed 11/17>>  Nutritional Goals:  Cyclic TPN:  3818 mL provides 110 g of protein and 1984 kcals per day  RD Assessment: Estimated Needs Total Energy Estimated Needs: 1950-2150 Total Protein Estimated Needs: 100-110g Total Fluid Estimated Needs: 2L/day  Current Nutrition:  NPO TPN - 12 hour cyclic (29/93)  Plan:   At 7169: Continue cyclic TPN. Infuse 2,040 mL over 12 hrs: 93 mL/hr x 1 hr, then 185 mL/hr x 10 hrs, then 93 mL/hr x 1 hr. Electrolytes in TPN. No change Na 50 mEq/L  K 60 mEq/L Ca 3 mEq/L Mg 5 mEq/L Phos 15 mmol/L Cl:Ac max acetate Add standard MVI and trace elements to TPN Discontinue CBG checks and SSI as BG are controlled with no insulin required in the past 72 hrs TPN labs on Mon/Thurs  Lenis Noon, PharmD 10/26/21 9:32 AM

## 2021-10-26 NOTE — Progress Notes (Signed)
Assessment & Plan: s/p Procedure(s): LYSIS OF ADHESION (N/A) SMALL BOWEL RESECTION X 2 WITH END COLOSTOMY (N/A)   Severe malnutrition, prolonged ileus  No further emesis.  NG on suction.  NPO / TNA   Dehydration: cont MIV    pSBO: due to pelvic adhesions from pelvic abscess.  Pt unable to tolerate NG, IR not able to place, pt unwilling to undergo NG placement in fluoro, IR unable to place decompressive PEG tube.  Pt unable to tolerate a PO diet, taken to OR 12/14 for ex lap.  Several loops of small bowel adherent to completely dehisced anastomosis.  SBR x2 with endo colostomy, pathology c/w carcinomatosis.  Ng output remains high.  D/c Octreotide until better bowel function and pt able to come off TPN.  Will cont IV Erythromycin q6h to encourage motility   Anemia: most likely related to chronic illness and malnutrition.  Iron supplements ordered but pt did not tolerate this.  Iron levels low.  IV iron given in early Dec, tachy overnight, will recheck and give blood as needed   anastomotic dehiscence  - JP drainage catheter appears to be in appropriate position, Zosyn d/c'd 11/14.  wbc normal.  - Rpt CT 11/20 shows increased abscess size with drain in correct position.   restarted drain flushes.  No sign of systemic infection.  Discussed with IR on 11/21.  No further targets available for additional drainage.   7 day course of abx repeated.   - CT 11/28 fluid collection more organized but slightly smaller, IR placed TG drain 11/30, Zosyn x5 days -12/8: GGF enema shows anastomotic leak, controlled with drain - 12/14 complete dehischence of anastomosis found on ex lap.  Colostomy created - 12/22 Abd JP removed CT 12/24 shows continued pelvic abscess and new RLQ fluid collection.  Will restart Zosyn and have IR eval for drainage tomorrow CT 12/29 shows improving fluid collections and continued ileus   ID: wbc elevated off abx.  Zosyn course completed and wbc normal now 1/5: IR drain  advanced with good purulent output   Physical deconditioning: Ambulate in hall TID, appreciate PT/OT involvement (Pt refusing less interventions now)   Narcotic dependence: dilaudid prn for pain, will wean slowly, Palliative involved   Urinary retention: foley replaced on Mon 11/1.  foley out again 11/7, failed voiding trial 11/9 with elevated PVR.   Foley replaced, will need urology f/u as outpatient.   Pt appeared to have a neurogenic bladder during ex lap, foley out, straight cath q8h but pt develpoed increased Cr so foley replaced   Pt with forming carcinomatous on recent exlap and new liver lesions concerning for mets on most recent CT.  Dr Benay Spice and pt aware.  Nothing further to do until recovered from GI standpoint   Dispo: pt has a temporary housing plan in place at discharge in [place] with his Aunt.  TOC team consulted and working with housing authority to assist with long term housing.            [name], MD       [facility]       Office: [phone]   Subjective: Patient in bed, no emesis overnight.  Objective: Vital signs in last 24 hours: Temp:  [97.7 F (36.5 C)-98.9 F (37.2 C)] 98.9 F (37.2 C) (01/08 0601) Pulse Rate:  [82-98] 98 (01/08 0601) Resp:  [16-18] 16 (01/08 0601) BP: (94-115)/(65-74) 107/65 (01/08 0601) SpO2:  [97 %-100 %] 98 % (01/08 0601) Last  BM Date: 10/25/21  Intake/Output from previous day: 01/07 0701 - 01/08 0700 In: 2634.7 [P.O.:240; I.V.:1694.8; IV Piggyback:699.9] Out: 2707 [Urine:2300; Emesis/NG output:1900; Drains:25; Stool:50] Intake/Output this shift: No intake/output data recorded.  Physical Exam: HEENT - sclerae clear, mucous membranes moist Neck - soft Abdomen - soft, small liquid green at ostomy; JP with pink output Neuro - alert & oriented, no focal deficits  Lab Results:  No results for input(s): WBC, HGB, HCT, PLT in the last 72 hours. BMET Recent Labs    10/24/21 0652 10/26/21 0451  NA  133* 137  K 4.2 4.2  CL 103 98  CO2 27 30  GLUCOSE 106* 127*  BUN 32* 35*  CREATININE 0.86 0.88  CALCIUM 8.3* 8.9   PT/INR No results for input(s): LABPROT, INR in the last 72 hours. Comprehensive Metabolic Panel:    Component Value Date/Time   NA 137 10/26/2021 0451   NA 133 (L) 10/24/2021 0652   K 4.2 10/26/2021 0451   K 4.2 10/24/2021 0652   CL 98 10/26/2021 0451   CL 103 10/24/2021 0652   CO2 30 10/26/2021 0451   CO2 27 10/24/2021 0652   BUN 35 (H) 10/26/2021 0451   BUN 32 (H) 10/24/2021 0652   CREATININE 0.88 10/26/2021 0451   CREATININE 0.86 10/24/2021 0652   CREATININE 0.79 06/09/2021 1020   CREATININE 0.86 05/07/2021 0826   GLUCOSE 127 (H) 10/26/2021 0451   GLUCOSE 106 (H) 10/24/2021 0652   CALCIUM 8.9 10/26/2021 0451   CALCIUM 8.3 (L) 10/24/2021 0652   AST 15 10/23/2021 0336   AST 15 10/20/2021 0039   AST 11 (L) 06/09/2021 1020   AST 16 05/07/2021 0826   ALT 13 10/23/2021 0336   ALT 17 10/20/2021 0039   ALT 5 06/09/2021 1020   ALT 7 05/07/2021 0826   ALKPHOS 67 10/23/2021 0336   ALKPHOS 61 10/20/2021 0039   BILITOT 0.5 10/23/2021 0336   BILITOT 0.4 10/20/2021 0039   BILITOT 0.3 06/09/2021 1020   BILITOT 0.3 05/07/2021 0826   PROT 7.4 10/23/2021 0336   PROT 6.6 10/20/2021 0039   ALBUMIN 2.7 (L) 10/23/2021 0336   ALBUMIN 2.3 (L) 10/20/2021 0039    Studies/Results: No results found.    [name] 10/26/2021   Patient ID: Phillip Brooks, male   DOB: 04/17/1956, 66 y.o.   MRN: [id number]

## 2021-10-27 LAB — COMPREHENSIVE METABOLIC PANEL
ALT: 9 U/L (ref 0–44)
AST: 15 U/L (ref 15–41)
Albumin: 2.5 g/dL — ABNORMAL LOW (ref 3.5–5.0)
Alkaline Phosphatase: 52 U/L (ref 38–126)
Anion gap: 9 (ref 5–15)
BUN: 33 mg/dL — ABNORMAL HIGH (ref 8–23)
CO2: 30 mmol/L (ref 22–32)
Calcium: 8.9 mg/dL (ref 8.9–10.3)
Chloride: 98 mmol/L (ref 98–111)
Creatinine, Ser: 0.65 mg/dL (ref 0.61–1.24)
GFR, Estimated: >60 mL/min (ref >60)
Glucose, Bld: 139 mg/dL — ABNORMAL HIGH (ref 70–99)
Potassium: 4.1 mmol/L (ref 3.5–5.1)
Sodium: 137 mmol/L (ref 135–145)
Total Bilirubin: 0.2 mg/dL — ABNORMAL LOW (ref 0.3–1.2)
Total Protein: 7.2 g/dL (ref 6.5–8.1)

## 2021-10-27 LAB — CBC WITH DIFFERENTIAL/PLATELET
Abs Immature Granulocytes: 0.02 10*3/uL (ref 0.00–0.07)
Basophils Absolute: 0 10*3/uL (ref 0.0–0.1)
Basophils Relative: 0 %
Eosinophils Absolute: 0.3 10*3/uL (ref 0.0–0.5)
Eosinophils Relative: 4 %
HCT: 26.9 % — ABNORMAL LOW (ref 39.0–52.0)
Hemoglobin: 8.2 g/dL — ABNORMAL LOW (ref 13.0–17.0)
Immature Granulocytes: 0 %
Lymphocytes Relative: 9 %
Lymphs Abs: 0.7 10*3/uL (ref 0.7–4.0)
MCH: 26.4 pg (ref 26.0–34.0)
MCHC: 30.5 g/dL (ref 30.0–36.0)
MCV: 86.5 fL (ref 80.0–100.0)
Monocytes Absolute: 0.5 10*3/uL (ref 0.1–1.0)
Monocytes Relative: 6 %
Neutro Abs: 6.3 10*3/uL (ref 1.7–7.7)
Neutrophils Relative %: 81 %
Platelets: 353 10*3/uL (ref 150–400)
RBC: 3.11 MIL/uL — ABNORMAL LOW (ref 4.22–5.81)
RDW: 17.4 % — ABNORMAL HIGH (ref 11.5–15.5)
WBC: 7.9 10*3/uL (ref 4.0–10.5)
nRBC: 0 % (ref 0.0–0.2)

## 2021-10-27 LAB — MAGNESIUM: Magnesium: 2.1 mg/dL (ref 1.7–2.4)

## 2021-10-27 LAB — PREALBUMIN: Prealbumin: 17.4 mg/dL — ABNORMAL LOW (ref 18–38)

## 2021-10-27 LAB — PHOSPHORUS: Phosphorus: 3.5 mg/dL (ref 2.5–4.6)

## 2021-10-27 LAB — TRIGLYCERIDES: Triglycerides: 111 mg/dL (ref <150)

## 2021-10-27 MED ORDER — TRAVASOL 10 % IV SOLN
INTRAVENOUS | Status: AC
  Filled 2021-10-27: qty 1101.6

## 2021-10-27 NOTE — Progress Notes (Signed)
1230 Pt c/o of "slight" nausea, refused nausea medicine at this time, then threw up 700 ml green emesis.  NGT hooked back up to [facility], needs addressed.

## 2021-10-27 NOTE — Progress Notes (Signed)
PHARMACY - TOTAL PARENTERAL NUTRITION CONSULT NOTE   Indication: intolerance to enteral feeding  Patient Measurements: Height: 5\' 6"  (167.6 cm) Weight: 60.3 kg (132 lb 15 oz) IBW/kg (Calculated) : 63.8 TPN AdjBW (KG): 60.1 Body mass index is 21.46 kg/m.  Assessment:  66 yo M presents on 10/28 for eval of rectal cancer. S/P LAR and diverting ileostomy. Pharmacy consulted to start TPN 11/5. Stopped TPN on 11/15 but had to restart on 11/17 as patient vomited as is unable to tolerate PO.  1/5: clamped NG tube for 8 hours - per RN patient tolerated  1/6: NGT clamped 1/7: Emesis overnight >500 mL. NG hooked back up to suction  Glucose / Insulin: No Hx DM.  - CBGs/SSI d/c w/ CBGs consistently at goal without insulin Electrolytes:  All lytes WNL including CorrCa (10.1) Renal: SCr WNL, BUN  33 - slightly elevated and trending down Hepatic: 1/9: Albumin remains low; LFTs and Tbili WNL Prealbumin: 17.4 TG: 12/26: WNL. 1/2: 182 slightly elevated 1/9: WNL Intake / Output:  - UOP 1600 mL, NGT: 100 mL; drain 45  mL - decreasing, stool: 100 IVF: None GI Imaging: 11/4 CT abdomen shows fluid collection, SBO, bladder thickening  11/17 KUB: Dilated small bowel loops are noted concerning for distal SBO 11/19 CT:  SBO, increase in size of gas and fluid abscess formation  11/28 CTAP: mild decrease in gas/gjuid in presacral space, no change in SBO 12/5 CTAP:  severe dilatation of small bowel loops in the abdomen concerning for ileus/obstruction 12/7 GGF enema: shows anastomotic leak, controlled with drain 12/24 CT: continued pelvic abscess and new RLQ fluid collection 12/29 CT: improved fluid collections and continued ileus 1/2 [place]: NG tube tip looped within gastric fundus, suggestive of partial SBO similar to that noted on CT 12/29 GI Surgeries / Procedures:  10/28: LAR/Diverting ileostomy  11/30: Successful CT-guided presacral pelvic abscess drain placement 12/14: Small bowel resection x 2 with end  colostomy, LOA; NGT placed for decompression  1/5: IR exchanged/repositioned transgluteal presacral drainage catheter  Central access: Implanted Port TPN start date: 11/5 >> 11/15. Resumed 11/17>>  Nutritional Goals:  Cyclic TPN:  5208 mL provides 110 g of protein and 1984 kcals per day  RD Assessment: Estimated Needs Total Energy Estimated Needs: 1950-2150 Total Protein Estimated Needs: 100-110g Total Fluid Estimated Needs: 2L/day  Current Nutrition:  NPO TPN - 12 hour cyclic (02/23)  Plan:   At 3612: Continue cyclic TPN. Infuse 2,040 mL over 12 hrs: 93 mL/hr x 1 hr, then 185 mL/hr x 10 hrs, then 93 mL/hr x 1 hr. Electrolytes in TPN. No change Na 50 mEq/L  K 60 mEq/L Ca 3 mEq/L Mg 5 mEq/L Phos 15 mmol/L Cl:Ac max acetate Add standard MVI and trace elements to TPN TPN labs on Mon/Thurs  Napoleon Form, PharmD 10/27/21 7:01 AM

## 2021-10-27 NOTE — Consult Note (Signed)
[facility] Nurse follow up:  Patient receiving care in HC0979 Stoma type/location: RUQ Ileostomy LLQ End colostomy (Dr. Marcello Moores, 12/14) Previous ileostomy (this admission, 10/28) Stomal assessment/size:  Ileostomy: 1 and 1/8, pink, prolapsed, moist End colostomy: 1 and 1/4 inch red, moist, lumen at 3 o'clock, flush with abdomen. Peristomal assessment: intact; seems to have more pain along distal aspect of each stoma with pouch removal  Treatment options for stomal/peristomal skin: skin barrier rings Output: Ileostomy: liquid green     End colostomy: mucous tan brown  Ostomy pouching: 2pc. 2 and 1/4 inch pouching systems with skin barrier rings Education provided:  Patient can verbalize steps for care of his ostomies.  Supplies in the room for pouch changes  Enrolled patient in [place] program: Yes (previously)   [place] [facility] team will continue to follow weekly   Jocelyn Lamer L. Tamala Julian, MSN, RN, [place], Lysle Pearl, [facility] Associate Pager [phone]

## 2021-10-27 NOTE — Progress Notes (Signed)
26 Days Post-Op LOA, SBR and end colostomy creation  Subjective/Chief Complaint: No new issues, NG to LWS with thin output in canister. Ambulated in hall yesterday.     Vital signs in last 24 hours: Temp:  [98.6 F (37 C)-98.9 F (37.2 C)] 98.6 F (37 C) (01/09 0518) Pulse Rate:  [92-93] 92 (01/09 0518) Resp:  [14] 14 (01/09 0518) BP: (108-112)/(71-75) 112/75 (01/09 0518) SpO2:  [98 %-100 %] 98 % (01/09 0518) Last BM Date: 10/25/21  Intake/Output from previous day: 01/08 0701 - 01/09 0700 In: 3096.5 [P.O.:30; I.V.:1975.1; IV Piggyback:1091.4] Out: 1945 [Urine:1600; Emesis/NG output:100; Drains:45; Stool:200] Intake/Output this shift: No intake/output data recorded.  General appearance: alert, cooperative, and no distress GI: non-distended, appropriately tender in lower abdomen; ostomy pink with liquid stool, colostomy viable Incisions OK, JP drain with purulent drainage  Lab Results:  Recent Labs    10/27/21 0422  WBC 7.9  HGB 8.2*  HCT 26.9*  PLT 353    BMET Recent Labs    10/26/21 0451 10/27/21 0422  NA 137 137  K 4.2 4.1  CL 98 98  CO2 30 30  GLUCOSE 127* 139*  BUN 35* 33*  CREATININE 0.88 0.65  CALCIUM 8.9 8.9    PT/INR No results for input(s): LABPROT, INR in the last 72 hours. ABG No results for input(s): PHART, HCO3 in the last 72 hours.  Invalid input(s): PCO2, PO2  Studies/Results: No results found.  Anti-infectives: Anti-infectives (From admission, onward)    Start     Dose/Rate Route Frequency Ordered Stop   10/22/21 1200  erythromycin 250 mg in sodium chloride 0.9 % 100 mL IVPB        250 mg 100 mL/hr over 60 Minutes Intravenous Every 6 hours 10/22/21 0841     10/12/21 1000  piperacillin-tazobactam (ZOSYN) IVPB 3.375 g  Status:  Discontinued        3.375 g 12.5 mL/hr over 240 Minutes Intravenous Every 8 hours 10/12/21 0727 10/21/21 0855   09/27/2021 2200  cefoTEtan (CEFOTAN) 1 g in sodium chloride 0.9 % 100 mL IVPB  Status:   Discontinued        1 g 200 mL/hr over 30 Minutes Intravenous Every 12 hours 09/20/2021 1608 10/07/2021 1643   09/26/2021 2200  cefoTEtan (CEFOTAN) 2 g in sodium chloride 0.9 % 100 mL IVPB        2 g 200 mL/hr over 30 Minutes Intravenous  Once 10/04/2021 1644 09/30/2021 2212   09/19/2021 1158  sodium chloride 0.9 % with cefoTEtan (CEFOTAN) ADS Med       Note to Pharmacy: Georgena Spurling W: cabinet override      10/04/2021 1158 10/02/21 0014   09/17/21 1730  piperacillin-tazobactam (ZOSYN) IVPB 3.375 g        3.375 g 12.5 mL/hr over 240 Minutes Intravenous Every 8 hours 09/17/21 1631 09/22/21 1001   09/08/21 1400  cefTRIAXone (ROCEPHIN) 2 g in sodium chloride 0.9 % 100 mL IVPB       Note to Pharmacy: Pharmacy may adjust dosing strength / duration / interval for maximal efficacy   2 g 200 mL/hr over 30 Minutes Intravenous Every 24 hours 09/08/21 1307 09/14/21 1431   08/26/21 1000  metroNIDAZOLE (FLAGYL) IVPB 500 mg  Status:  Discontinued        500 mg 100 mL/hr over 60 Minutes Intravenous Every 12 hours 08/26/21 0830 08/26/21 0910   08/26/21 1000  piperacillin-tazobactam (ZOSYN) IVPB 3.375 g  Status:  Discontinued  3.375 g 12.5 mL/hr over 240 Minutes Intravenous Every 8 hours 08/26/21 0910 09/01/21 0819   08/25/21 1030  metroNIDAZOLE (FLAGYL) tablet 500 mg  Status:  Discontinued        500 mg Per Tube Every 12 hours 08/25/21 0935 08/26/21 0830   08/25/21 1000  cefTRIAXone (ROCEPHIN) 2 g in sodium chloride 0.9 % 100 mL IVPB  Status:  Discontinued       Note to Pharmacy: Pharmacy may adjust dosing strength / duration / interval for maximal efficacy   2 g 200 mL/hr over 30 Minutes Intravenous Every 24 hours 08/25/21 0823 08/26/21 0910   08/25/21 1000  metroNIDAZOLE (FLAGYL) tablet 500 mg  Status:  Discontinued        500 mg Oral Every 12 hours 08/25/21 0837 08/25/21 0935   07/25/2021 2000  cefoTEtan (CEFOTAN) 2 g in sodium chloride 0.9 % 100 mL IVPB        2 g 200 mL/hr over 30 Minutes Intravenous  Every 12 hours 07/29/2021 1647 08/16/2021 2046   08/03/2021 0600  cefoTEtan (CEFOTAN) 2 g in sodium chloride 0.9 % 100 mL IVPB        2 g 200 mL/hr over 30 Minutes Intravenous On call to O.R. 07/24/2021 0947 07/29/2021 0806       Assessment/Plan: s/p Procedure(s): LYSIS OF ADHESION (N/A) SMALL BOWEL RESECTION X 2 WITH END COLOSTOMY (N/A)  Severe malnutrition, prolonged ileus: pt not eating well enough to stop TPN, can't tolerate a diet, cont sips and NG for now, will clamp NG again today.  Return to [facility] if nauseated  Dehydration: cont MIV   pSBO: due to pelvic adhesions from pelvic abscess.  Pt unable to tolerate NG, IR not able to place, pt unwilling to undergo NG placement in fluoro, IR unable to place decompressive PEG tube.  Pt unable to tolerate a PO diet, taken to OR 12/14 for ex lap.  Several loops of small bowel adherent to completely dehisced anastomosis.  SBR x2 with endo colostomy, pathology c/w carcinomatosis.  Ng output remains high.  D/c Octreotide until better bowel function and pt able to come off TPN.  Will cont IV Erythromycin q6h to encourage motility  Anemia: most likely related to chronic illness and malnutrition.  Iron supplements ordered but pt did not tolerate this.  Iron levels low.  IV iron given in early Dec, tachy overnight, will recheck and give blood as needed  anastomotic dehiscence  - JP drainage catheter appears to be in appropriate position, Zosyn d/c'd 11/14.  wbc normal.  - Rpt CT 11/20 shows increased abscess size with drain in correct position.   restarted drain flushes.  No sign of systemic infection.  Discussed with IR on 11/21.  No further targets available for additional drainage.   7 day course of abx repeated.   - CT 11/28 fluid collection more organized but slightly smaller, IR placed TG drain 11/30, Zosyn x5 days -12/8: GGF enema shows anastomotic leak, controlled with drain - 12/14 complete dehischence of anastomosis found on ex lap.  Colostomy  created - 12/22 Abd JP removed CT 12/24 shows continued pelvic abscess and new RLQ fluid collection.  Will restart Zosyn and have IR eval for drainage tomorrow CT 12/29 shows improving fluid collections and continued ileus  ID: wbc elevated off abx.  Zosyn course completed and wbc normal now 1/5: IR drain advanced with good purulent output  Physical deconditioning: Ambulate in hall TID, appreciate PT/OT involvement (Pt refusing less interventions now)  Narcotic dependence: dilaudid prn for pain, will wean slowly, Palliative involved  Urinary retention: foley replaced on Mon 11/1.  foley out again 11/7, failed voiding trial 11/9 with elevated PVR.   Foley replaced, will need urology f/u as outpatient.   Pt appeared to have a neurogenic bladder during ex lap, foley out, straight cath q8h but pt develpoed increased Cr so foley replaced  Pt with forming carcinomatous on recent exlap and new liver lesions concerning for mets on most recent CT.  Dr Benay Spice and pt aware.  Nothing further to do until recovered from GI standpoint  Dispo: pt has a temporary housing plan in place at discharge in [place] with his Aunt.  TOC team consulted and working with housing authority to assist with long term housing.      LOS: 73 days    Rosario Adie 12/24/5434

## 2021-10-27 NOTE — Progress Notes (Signed)
NT assisted patient with ambulation.  Patient ambulated in the hallway with front wheel walker.  No complaints offered.  Tolerated activity well.  NGT remains clamped, denies nausea nor abdominal distention at this time.

## 2021-10-27 NOTE — Progress Notes (Signed)
Offered to ambulate patient 2x today at 1130 and at 1610, patient refused at both times stating "I do not feel ok, please come back later".  Scheduled ambulation activity at 1700H, patient agreed at this time.

## 2021-10-27 NOTE — Progress Notes (Signed)
NGT clamped per order.  Will resume ILWS if c/o nausea.  Started patient on clear liquid diet.  Educated patient on care plan, stated agreement and understanding.  Will monitor.

## 2021-10-27 NOTE — Progress Notes (Signed)
Patient stated that he is thirsty, dry and dehydrated and started drinking water. Patient educated about diet orders but patient continues to drink water  as per NT. We will continue to monitor.

## 2021-10-28 MED ORDER — TRAVASOL 10 % IV SOLN
INTRAVENOUS | Status: AC
  Filled 2021-10-28: qty 1101.6

## 2021-10-28 NOTE — Progress Notes (Signed)
Referring Physician(s): [place]  Supervising Physician: Phillip Brooks  Patient Status:  [facility] - In-pt  Chief Complaint: Abdominal pain/abscess   Subjective: Pt without new c/o   Allergies: Patient has no known allergies.  Medications: Prior to Admission medications   Medication Sig Start Date End Date Taking? Authorizing Provider  acetaminophen (TYLENOL) 650 MG CR tablet Take 650 mg by mouth every 8 (eight) hours as needed for pain.   Yes [provider]  oxyCODONE (OXY IR/ROXICODONE) 5 MG immediate release tablet Take 1 tablet (5 mg total) by mouth every 6 (six) hours as needed for severe pain. 08/11/21  Yes Owens Shark, NP  traMADol (ULTRAM) 50 MG tablet Take 50 mg by mouth every 12 (twelve) hours as needed for moderate pain. 06/25/21   [provider]     Vital Signs: BP 112/72 (BP Location: Right Arm)    Pulse 90    Temp 99.1 F (37.3 C) (Oral)    Resp 17    Ht 5\' 6"  (1.676 m)    Wt 132 lb 15 oz (60.3 kg)    SpO2 100%    BMI 21.46 kg/m   Physical Exam awake/alert; left TG drain intact, dressing clean and dry, site mildly tender, OP 15 cc turbid, beige fluid  Imaging: No results found.  Labs:  CBC: Recent Labs    10/16/21 0555 10/17/21 0621 10/20/21 0039 10/27/21 0422  WBC 10.7* 10.5 8.6 7.9  HGB 7.0* 8.2* 8.3* 8.2*  HCT 24.8* 26.9* 27.0* 26.9*  PLT 603* 530* 500* 353    COAGS: Recent Labs    12/06/20 1706  INR 1.1*    BMP: Recent Labs    10/23/21 0336 10/24/21 0652 10/26/21 0451 10/27/21 0422  NA 137 133* 137 137  K 4.0 4.2 4.2 4.1  CL 102 103 98 98  CO2 27 27 30 30   GLUCOSE 146* 106* 127* 139*  BUN 21 32* 35* 33*  CALCIUM 9.2 8.3* 8.9 8.9  CREATININE 0.90 0.86 0.88 0.65  GFRNONAA >60 >60 >60 >60    LIVER FUNCTION TESTS: Recent Labs    10/16/21 0555 10/20/21 0039 10/23/21 0336 10/27/21 0422  BILITOT 0.3 0.4 0.5 0.2*  AST 17 15 15 15   ALT 22 17 13 9   ALKPHOS 64 61 67 52  PROT 7.7 6.6 7.4 7.2  ALBUMIN 2.5*  2.3* 2.7* 2.5*    Assessment and Plan: Hx rectal ca; Pt s/p lower anterior resection with diverting ostomy on 09/32/3557 Complicated by presacral pelvic abscess development s/p left 10 f TG drain placement on 32/20/2542 Further complicated by development of right-sided anast leak noted on contrast enema 12/8 S/p lysis of adhesions with small bowel ressection with general surgery on 12/14; s/p successful exchange and repositioning of transgluteal presacral drainage catheter on 10/23/21; temp 99.1, last WBC nl, hgb stable, creat nl  Drain Location: left TG Size: Fr size: 10 Fr Date of placement: 10/23/21  Currently to: Drain collection device: suction bulb 24 hour output:  Output by Drain (mL) 10/26/21 0701 - 10/26/21 1900 10/26/21 1901 - 10/27/21 0700 10/27/21 0701 - 10/27/21 1900 10/27/21 1901 - 10/28/21 0700 10/28/21 0701 - 10/28/21 1024  Closed System Drain Left Buttock Bulb (JP) 10.2 Fr.  45  15 0       Current examination: Flushes/aspirates easily.  Insertion site unremarkable. Suture and stat lock in place. Dressed appropriately.   Plan:  Record output Q shift. Dressing changes QD or PRN if soiled.  Call IR APP  or on call IR MD if difficulty flushing or sudden change in drain output.  Repeat imaging/possible drain injection once output < 10 mL/QD (excluding flush material.)  Discharge planning: Please contact IR APP or on call IR MD prior to patient d/c to ensure appropriate follow up plans are in place. Typically patient will follow up with IR clinic 10-14 days post d/c for repeat imaging/possible drain injection. IR scheduler will contact patient with date/time of appointment. Patient will need to flush drain QD with 5 cc NS, record output QD, dressing changes every 2-3 days or earlier if soiled.   IR will continue to follow - please call with questions or concerns.     Electronically Signed: D. Rowe Robert, PA-C 10/28/2021, 10:19 AM   I spent a total of 15 Minutes  at the the patient's bedside AND on the patient's hospital floor or unit, greater than 50% of which was counseling/coordinating care for presacral abscess drain    Patient ID: Phillip Brooks, male   DOB: [birth year], 66 y.o.   MRN: [id number]

## 2021-10-28 NOTE — Progress Notes (Signed)
PHARMACY - TOTAL PARENTERAL NUTRITION CONSULT NOTE   Indication: intolerance to enteral feeding  Patient Measurements: Height: 5\' 6"  (167.6 cm) Weight: 60.3 kg (132 lb 15 oz) IBW/kg (Calculated) : 63.8 TPN AdjBW (KG): 60.1 Body mass index is 21.46 kg/m.  Assessment:  66 yo M presents on 10/28 for eval of rectal cancer. S/P LAR and diverting ileostomy. Pharmacy consulted to start TPN 11/5. Stopped TPN on 11/15 but had to restart on 11/17 as patient vomited as is unable to tolerate PO.  1/5: clamped NG tube for 8 hours - per RN patient tolerated  1/6: NGT clamped 1/7: Emesis overnight >500 mL. NG hooked back up to suction 1/9: NG clamped with one episode of emesis per surgery note  Glucose / Insulin: No Hx DM.  - CBGs/SSI d/c w/ CBGs consistently at goal without insulin Electrolytes:  1/9: All lytes WNL including CorrCa (10.1) Renal: 1/9: SCr WNL, BUN  33 - slightly elevated and trending down Hepatic: 1/9: Albumin remains low; LFTs and Tbili WNL Prealbumin: 17.4 TG: 12/26: WNL. 1/2: 182 slightly elevated 1/9: WNL Intake / Output:  - UOP 1750 mL, NGT: 800 mL; drain minimal, stool: 150 IVF: None GI Imaging: 11/4 CT abdomen shows fluid collection, SBO, bladder thickening  11/17 KUB: Dilated small bowel loops are noted concerning for distal SBO 11/19 CT:  SBO, increase in size of gas and fluid abscess formation  11/28 CTAP: mild decrease in gas/gjuid in presacral space, no change in SBO 12/5 CTAP:  severe dilatation of small bowel loops in the abdomen concerning for ileus/obstruction 12/7 GGF enema: shows anastomotic leak, controlled with drain 12/24 CT: continued pelvic abscess and new RLQ fluid collection 12/29 CT: improved fluid collections and continued ileus 1/2 [place]: NG tube tip looped within gastric fundus, suggestive of partial SBO similar to that noted on CT 12/29 GI Surgeries / Procedures:  10/28: LAR/Diverting ileostomy  11/30: Successful CT-guided presacral pelvic abscess  drain placement 12/14: Small bowel resection x 2 with end colostomy, LOA; NGT placed for decompression  1/5: IR exchanged/repositioned transgluteal presacral drainage catheter  Central access: Implanted Port TPN start date: 11/5 >> 11/15. Resumed 11/17>>  Nutritional Goals:  Cyclic TPN:  7282 mL provides 110 g of protein and 1984 kcals per day  RD Assessment: Estimated Needs Total Energy Estimated Needs: 1950-2150 Total Protein Estimated Needs: 100-110g Total Fluid Estimated Needs: 2L/day  Current Nutrition:  NPO TPN - 12 hour cyclic (06/01)  Plan:   At 5615: Continue cyclic TPN. Infuse 2,040 mL over 12 hrs: 93 mL/hr x 1 hr, then 185 mL/hr x 10 hrs, then 93 mL/hr x 1 hr. Electrolytes in TPN. No change Na 50 mEq/L  K 60 mEq/L Ca 3 mEq/L Mg 5 mEq/L Phos 15 mmol/L Cl:Ac max acetate Add standard MVI and trace elements to TPN TPN labs on Mon/Thurs BMET, Mg, and Phos in AM  Ulice Dash D, PharmD 10/28/21 10:03 AM

## 2021-10-28 NOTE — Plan of Care (Signed)
  Problem: Coping: Goal: Level of anxiety will decrease Outcome: Progressing   Problem: Pain Managment: Goal: General experience of comfort will improve Outcome: Progressing   Problem: Safety: Goal: Ability to remain free from injury will improve Outcome: Progressing   

## 2021-10-28 NOTE — Progress Notes (Signed)
27 Days Post-Op LOA, SBR and end colostomy creation  Subjective/Chief Complaint: No new issues, NG clamped yesterday with one episode of emesis. Ambulated in hall yesterday.     Vital signs in last 24 hours: Temp:  [97.4 F (36.3 C)-99.1 F (37.3 C)] 99.1 F (37.3 C) (01/10 0500) Pulse Rate:  [86-100] 90 (01/10 0500) Resp:  [14-17] 17 (01/10 0500) BP: (103-112)/(71-86) 112/72 (01/10 0500) SpO2:  [100 %] 100 % (01/10 0500) Last BM Date: 10/27/21 (via ostomy)  Intake/Output from previous day: 01/09 0701 - 01/10 0700 In: 2462.6 [P.O.:300; I.V.:1462.5; IV Piggyback:700.1] Out: 2725 [Urine:1750; Emesis/NG output:800; Drains:15; Stool:160] Intake/Output this shift: No intake/output data recorded.  General appearance: alert, cooperative, and no distress GI: non-distended, appropriately tender in lower abdomen; ostomy pink with liquid stool, colostomy viable Incisions OK, JP drain with purulent drainage  Lab Results:  Recent Labs    10/27/21 0422  WBC 7.9  HGB 8.2*  HCT 26.9*  PLT 353    BMET Recent Labs    10/26/21 0451 10/27/21 0422  NA 137 137  K 4.2 4.1  CL 98 98  CO2 30 30  GLUCOSE 127* 139*  BUN 35* 33*  CREATININE 0.88 0.65  CALCIUM 8.9 8.9    PT/INR No results for input(s): LABPROT, INR in the last 72 hours. ABG No results for input(s): PHART, HCO3 in the last 72 hours.  Invalid input(s): PCO2, PO2  Studies/Results: No results found.  Anti-infectives: Anti-infectives (From admission, onward)    Start     Dose/Rate Route Frequency Ordered Stop   10/22/21 1200  erythromycin 250 mg in sodium chloride 0.9 % 100 mL IVPB        250 mg 100 mL/hr over 60 Minutes Intravenous Every 6 hours 10/22/21 0841     10/12/21 1000  piperacillin-tazobactam (ZOSYN) IVPB 3.375 g  Status:  Discontinued        3.375 g 12.5 mL/hr over 240 Minutes Intravenous Every 8 hours 10/12/21 0727 10/21/21 0855   09/25/2021 2200  cefoTEtan (CEFOTAN) 1 g in sodium chloride 0.9 % 100 mL  IVPB  Status:  Discontinued        1 g 200 mL/hr over 30 Minutes Intravenous Every 12 hours 09/28/2021 1608 09/23/2021 1643   10/13/2021 2200  cefoTEtan (CEFOTAN) 2 g in sodium chloride 0.9 % 100 mL IVPB        2 g 200 mL/hr over 30 Minutes Intravenous  Once 10/12/2021 1644 09/20/2021 2212   10/16/2021 1158  sodium chloride 0.9 % with cefoTEtan (CEFOTAN) ADS Med       Note to Pharmacy: Georgena Spurling W: cabinet override      10/18/2021 1158 10/02/21 0014   09/17/21 1730  piperacillin-tazobactam (ZOSYN) IVPB 3.375 g        3.375 g 12.5 mL/hr over 240 Minutes Intravenous Every 8 hours 09/17/21 1631 09/22/21 1001   09/08/21 1400  cefTRIAXone (ROCEPHIN) 2 g in sodium chloride 0.9 % 100 mL IVPB       Note to Pharmacy: Pharmacy may adjust dosing strength / duration / interval for maximal efficacy   2 g 200 mL/hr over 30 Minutes Intravenous Every 24 hours 09/08/21 1307 09/14/21 1431   08/26/21 1000  metroNIDAZOLE (FLAGYL) IVPB 500 mg  Status:  Discontinued        500 mg 100 mL/hr over 60 Minutes Intravenous Every 12 hours 08/26/21 0830 08/26/21 0910   08/26/21 1000  piperacillin-tazobactam (ZOSYN) IVPB 3.375 g  Status:  Discontinued  3.375 g 12.5 mL/hr over 240 Minutes Intravenous Every 8 hours 08/26/21 0910 09/01/21 0819   08/25/21 1030  metroNIDAZOLE (FLAGYL) tablet 500 mg  Status:  Discontinued        500 mg Per Tube Every 12 hours 08/25/21 0935 08/26/21 0830   08/25/21 1000  cefTRIAXone (ROCEPHIN) 2 g in sodium chloride 0.9 % 100 mL IVPB  Status:  Discontinued       Note to Pharmacy: Pharmacy may adjust dosing strength / duration / interval for maximal efficacy   2 g 200 mL/hr over 30 Minutes Intravenous Every 24 hours 08/25/21 0823 08/26/21 0910   08/25/21 1000  metroNIDAZOLE (FLAGYL) tablet 500 mg  Status:  Discontinued        500 mg Oral Every 12 hours 08/25/21 0837 08/25/21 0935   08/04/2021 2000  cefoTEtan (CEFOTAN) 2 g in sodium chloride 0.9 % 100 mL IVPB        2 g 200 mL/hr over 30 Minutes  Intravenous Every 12 hours 08/01/2021 1647 07/20/2021 2046   07/19/2021 0600  cefoTEtan (CEFOTAN) 2 g in sodium chloride 0.9 % 100 mL IVPB        2 g 200 mL/hr over 30 Minutes Intravenous On call to O.R. 08/14/2021 5035 08/05/2021 0806       Assessment/Plan: s/p Procedure(s): LYSIS OF ADHESION (N/A) SMALL BOWEL RESECTION X 2 WITH END COLOSTOMY (N/A)  Severe malnutrition, prolonged ileus: pt not eating well enough to stop TPN, can't tolerate a diet, cont sips and NG for now, will clamp NG again today.  Return to [facility] if nauseated  Dehydration: cont MIV   pSBO: due to pelvic adhesions from pelvic abscess.  Pt unable to tolerate NG, IR not able to place, pt unwilling to undergo NG placement in fluoro, IR unable to place decompressive PEG tube.  Pt unable to tolerate a PO diet, taken to OR 12/14 for ex lap.  Several loops of small bowel adherent to completely dehisced anastomosis.  SBR x2 with endo colostomy, pathology c/w carcinomatosis.  Ng output remains high.  D/c Octreotide until better bowel function and pt able to come off TPN.  Will cont IV Erythromycin q6h to encourage motility  Anemia: most likely related to chronic illness and malnutrition.  Iron supplements ordered but pt did not tolerate this.  Iron levels low.  IV iron given in early Dec, tachy overnight, will recheck and give blood as needed  anastomotic dehiscence  - JP drainage catheter appears to be in appropriate position, Zosyn d/c'd 11/14.  wbc normal.  - Rpt CT 11/20 shows increased abscess size with drain in correct position.   restarted drain flushes.  No sign of systemic infection.  Discussed with IR on 11/21.  No further targets available for additional drainage.   7 day course of abx repeated.   - CT 11/28 fluid collection more organized but slightly smaller, IR placed TG drain 11/30, Zosyn x5 days -12/8: GGF enema shows anastomotic leak, controlled with drain - 12/14 complete dehischence of anastomosis found on ex lap.   Colostomy created - 12/22 Abd JP removed CT 12/24 shows continued pelvic abscess and new RLQ fluid collection.  Will restart Zosyn and have IR eval for drainage tomorrow CT 12/29 shows improving fluid collections and continued ileus  ID: wbc elevated off abx.  Zosyn course completed and wbc normal now 1/5: IR drain advanced with good purulent output  Physical deconditioning: Ambulate in hall TID, appreciate PT/OT involvement (Pt refusing less interventions now)  Narcotic dependence: dilaudid prn for pain, will wean slowly, Palliative involved  Urinary retention: foley replaced on Mon 11/1.  foley out again 11/7, failed voiding trial 11/9 with elevated PVR.   Foley replaced, will need urology f/u as outpatient.   Pt appeared to have a neurogenic bladder during ex lap, foley out, straight cath q8h but pt develpoed increased Cr so foley replaced  Pt with forming carcinomatous on recent exlap and new liver lesions concerning for mets on most recent CT.  Dr Benay Spice and pt aware.  Nothing further to do until recovered from GI standpoint  Dispo: pt has a temporary housing plan in place at discharge in [place] with his Aunt.  TOC team consulted and working with housing authority to assist with long term housing.      LOS: 74 days    Rosario Adie 5/64/3329

## 2021-10-28 NOTE — Progress Notes (Signed)
Nutrition Follow-up  DOCUMENTATION CODES:   Severe malnutrition in context of chronic illness  INTERVENTION:   -Cyclic TPN management per Pharmacy  NUTRITION DIAGNOSIS:   Severe Malnutrition related to chronic illness, cancer and cancer related treatments as evidenced by severe fat depletion, severe muscle depletion.  Ongoing.  GOAL:   Patient will meet greater than or equal to 90% of their needs  Meeting with TPN  MONITOR:   Diet advancement, Labs, Weight trends, Skin, I & O's, Other (Comment) (TPN)  ASSESSMENT:   66 year old male who presented on 10/28 for surgery. PMH of stage III rectal cancer s/p chemotherapy and radiation.  10/28 - s/p cystoscopy with balloon dilation of bulbar urethral stricture and bilateral ureteral catheterization, lower anterior colon resection with diverting ileostomy; clear liquids 10/29 - NPO 10/30 - full liquids 10/31 - NPO 11/2 - NGT placed with significant output 11/4 - CT abdomen showing fluid collection, SBO, bladder thickening 11/5 - TPN started 11/7 - NGT removed 11/9 - CLD 11/10 - FLD 11/11 - Soft diet  11/15 - TPN stopped 11/17 - emesis x2, TPN restarted 11/30 -IR placed TG drain 12/14- s/p LOA, SB resection x 2 w/ end colostomy, NGT replaced  Patient now on clears. NGT was clamped yesterday, output: 100 ml x 24 hrs. Had an episode of vomiting resulting in 700 ml of emesis.   Cyclic TPN continues x 12 hours, providing 1983 kcals and 110g protein.  Admission weight: 131 lbs. Last weight: 132 lbs (1/4)  Medications: Reglan, Remeron, Zofran  Labs reviewed: CBGs: 82-134  Diet Order:   Diet Order             Diet clear liquid Room service appropriate? Yes; Fluid consistency: Thin  Diet effective now                   EDUCATION NEEDS:   Education needs have been addressed  Skin:  Skin Assessment: Skin Integrity Issues: Skin Integrity Issues:: Incisions Incisions: abdomen  Last BM:  1/10  -ileostomy  Height:   Ht Readings from Last 1 Encounters:  10/02/2021 5\' 6"  (1.676 m)    Weight:   Wt Readings from Last 1 Encounters:  10/22/21 60.3 kg    BMI:  Body mass index is 21.46 kg/m.  Estimated Nutritional Needs:   Kcal:  1950-2150  Protein:  100-110g  Fluid:  2L/day  Clayton Bibles, MS, RD, LDN Inpatient Clinical Dietitian Contact information available via Amion

## 2021-10-29 LAB — BASIC METABOLIC PANEL
Anion gap: 5 (ref 5–15)
BUN: 29 mg/dL — ABNORMAL HIGH (ref 8–23)
CO2: 29 mmol/L (ref 22–32)
Calcium: 8.3 mg/dL — ABNORMAL LOW (ref 8.9–10.3)
Chloride: 99 mmol/L (ref 98–111)
Creatinine, Ser: 0.84 mg/dL (ref 0.61–1.24)
GFR, Estimated: >60 mL/min (ref >60)
Glucose, Bld: 99 mg/dL (ref 70–99)
Potassium: 4.1 mmol/L (ref 3.5–5.1)
Sodium: 133 mmol/L — ABNORMAL LOW (ref 135–145)

## 2021-10-29 LAB — PHOSPHORUS: Phosphorus: 3.4 mg/dL (ref 2.5–4.6)

## 2021-10-29 LAB — MAGNESIUM: Magnesium: 1.9 mg/dL (ref 1.7–2.4)

## 2021-10-29 MED ORDER — HYDROMORPHONE HCL 1 MG/ML IJ SOLN
1.0000 mg | INTRAMUSCULAR | Status: DC | PRN
  Administered 2021-10-29 – 2021-11-04 (×42): 1 mg via INTRAVENOUS
  Filled 2021-10-29 (×42): qty 1

## 2021-10-29 MED ORDER — TRAVASOL 10 % IV SOLN
INTRAVENOUS | Status: AC
  Filled 2021-10-29: qty 1101.6

## 2021-10-29 NOTE — Progress Notes (Signed)
PHARMACY - TOTAL PARENTERAL NUTRITION CONSULT NOTE   Indication: intolerance to enteral feeding  Patient Measurements: Height: 5\' 6"  (167.6 cm) Weight: 60.3 kg (132 lb 15 oz) IBW/kg (Calculated) : 63.8 TPN AdjBW (KG): 60.1 Body mass index is 21.46 kg/m.  Assessment:  66 yo M presents on 10/28 for eval of rectal cancer. S/P LAR and diverting ileostomy. Pharmacy consulted to start TPN 11/5. Stopped TPN on 11/15 but had to restart on 11/17 as patient vomited as is unable to tolerate PO.  1/5: clamped NG tube for 8 hours - per RN patient tolerated  1/6: NGT clamped 1/7: Emesis overnight >500 mL. NG hooked back up to suction 1/9: NG clamped with one episode of emesis per surgery note 1/11: significant emesis then NG hooked back up and returned significant amount  Glucose / Insulin: No Hx DM.  - CBGs/SSI d/c w/ CBGs consistently at goal without insulin Electrolytes:  1/11: Na low at 133. All other lytes WNL including CorrCa (9.5) Renal: 111: SCr WNL, BUN  29 - slightly elevated and trending down Hepatic: 1/9: Albumin remains low; LFTs and Tbili WNL Prealbumin: 17.4 TG: 12/26: WNL. 1/2: 182 slightly elevated 1/9: WNL Intake / Output:  - UOP 1750 mL, NGT/emesis: 850 mL; drain minimal, stool: 375 ml IVF: None GI Imaging: 11/4 CT abdomen shows fluid collection, SBO, bladder thickening  11/17 KUB: Dilated small bowel loops are noted concerning for distal SBO 11/19 CT:  SBO, increase in size of gas and fluid abscess formation  11/28 CTAP: mild decrease in gas/gjuid in presacral space, no change in SBO 12/5 CTAP:  severe dilatation of small bowel loops in the abdomen concerning for ileus/obstruction 12/7 GGF enema: shows anastomotic leak, controlled with drain 12/24 CT: continued pelvic abscess and new RLQ fluid collection 12/29 CT: improved fluid collections and continued ileus 1/2 [place]: NG tube tip looped within gastric fundus, suggestive of partial SBO similar to that noted on CT 12/29 GI  Surgeries / Procedures:  10/28: LAR/Diverting ileostomy  11/30: Successful CT-guided presacral pelvic abscess drain placement 12/14: Small bowel resection x 2 with end colostomy, LOA; NGT placed for decompression  1/5: IR exchanged/repositioned transgluteal presacral drainage catheter  Central access: Implanted Port TPN start date: 11/5 >> 11/15. Resumed 11/17>>  Nutritional Goals:  Cyclic TPN:  7106 mL provides 110 g of protein and 1984 kcals per day  RD Assessment: Estimated Needs Total Energy Estimated Needs: 1950-2150 Total Protein Estimated Needs: 100-110g Total Fluid Estimated Needs: 2L/day  Current Nutrition:  NPO TPN - 12 hour cyclic (26/94)  Plan:   At 8546: Continue cyclic TPN. Infuse 2,040 mL over 12 hrs: 93 mL/hr x 1 hr, then 185 mL/hr x 10 hrs, then 93 mL/hr x 1 hr. Electrolytes in TPN. No change Na 60 mEq/L  K 60 mEq/L Ca 3 mEq/L Mg 5 mEq/L Phos 15 mmol/L Cl:Ac max acetate Add standard MVI and trace elements to TPN TPN labs on Mon/Thurs  Napoleon Form, PharmD 10/29/21 10:08 AM

## 2021-10-29 NOTE — Progress Notes (Signed)
28 Days Post-Op LOA, SBR and end colostomy creation  Subjective/Chief Complaint: No new issues, NG clamped yesterday with one episode of emesis last night. Ambulated in hall yesterday.     Vital signs in last 24 hours: Temp:  [99 F (37.2 C)] 99 F (37.2 C) (01/11 0517) Pulse Rate:  [90-93] 93 (01/11 0517) Resp:  [15-16] 16 (01/11 0517) BP: (110-113)/(68-81) 110/68 (01/11 0517) SpO2:  [98 %-99 %] 98 % (01/11 0517) Last BM Date: 10/28/21  Intake/Output from previous day: 01/10 0701 - 01/11 0700 In: 3111.9 [P.O.:280; I.V.:1989.8; IV Piggyback:832.1] Out: 5956 [Urine:1750; Emesis/NG output:850; Drains:30; Stool:375] Intake/Output this shift: Total I/O In: -  Out: 200 [Urine:200]  General appearance: alert, cooperative, and no distress GI: non-distended, appropriately tender in lower abdomen; ostomy pink with liquid stool, colostomy viable Incisions OK, JP drain with purulent drainage  Lab Results:  Recent Labs    10/27/21 0422  WBC 7.9  HGB 8.2*  HCT 26.9*  PLT 353    BMET Recent Labs    10/27/21 0422 10/29/21 0610  NA 137 133*  K 4.1 4.1  CL 98 99  CO2 30 29  GLUCOSE 139* 99  BUN 33* 29*  CREATININE 0.65 0.84  CALCIUM 8.9 8.3*    PT/INR No results for input(s): LABPROT, INR in the last 72 hours. ABG No results for input(s): PHART, HCO3 in the last 72 hours.  Invalid input(s): PCO2, PO2  Studies/Results: No results found.  Anti-infectives: Anti-infectives (From admission, onward)    Start     Dose/Rate Route Frequency Ordered Stop   10/22/21 1200  erythromycin 250 mg in sodium chloride 0.9 % 100 mL IVPB        250 mg 100 mL/hr over 60 Minutes Intravenous Every 6 hours 10/22/21 0841     10/12/21 1000  piperacillin-tazobactam (ZOSYN) IVPB 3.375 g  Status:  Discontinued        3.375 g 12.5 mL/hr over 240 Minutes Intravenous Every 8 hours 10/12/21 0727 10/21/21 0855   09/22/2021 2200  cefoTEtan (CEFOTAN) 1 g in sodium chloride 0.9 % 100 mL IVPB  Status:   Discontinued        1 g 200 mL/hr over 30 Minutes Intravenous Every 12 hours 09/20/2021 1608 09/25/2021 1643   09/30/2021 2200  cefoTEtan (CEFOTAN) 2 g in sodium chloride 0.9 % 100 mL IVPB        2 g 200 mL/hr over 30 Minutes Intravenous  Once 09/24/2021 1644 09/28/2021 2212   10/03/2021 1158  sodium chloride 0.9 % with cefoTEtan (CEFOTAN) ADS Med       Note to Pharmacy: Georgena Spurling W: cabinet override      10/14/2021 1158 10/02/21 0014   09/17/21 1730  piperacillin-tazobactam (ZOSYN) IVPB 3.375 g        3.375 g 12.5 mL/hr over 240 Minutes Intravenous Every 8 hours 09/17/21 1631 09/22/21 1001   09/08/21 1400  cefTRIAXone (ROCEPHIN) 2 g in sodium chloride 0.9 % 100 mL IVPB       Note to Pharmacy: Pharmacy may adjust dosing strength / duration / interval for maximal efficacy   2 g 200 mL/hr over 30 Minutes Intravenous Every 24 hours 09/08/21 1307 09/14/21 1431   08/26/21 1000  metroNIDAZOLE (FLAGYL) IVPB 500 mg  Status:  Discontinued        500 mg 100 mL/hr over 60 Minutes Intravenous Every 12 hours 08/26/21 0830 08/26/21 0910   08/26/21 1000  piperacillin-tazobactam (ZOSYN) IVPB 3.375 g  Status:  Discontinued  3.375 g 12.5 mL/hr over 240 Minutes Intravenous Every 8 hours 08/26/21 0910 09/01/21 0819   08/25/21 1030  metroNIDAZOLE (FLAGYL) tablet 500 mg  Status:  Discontinued        500 mg Per Tube Every 12 hours 08/25/21 0935 08/26/21 0830   08/25/21 1000  cefTRIAXone (ROCEPHIN) 2 g in sodium chloride 0.9 % 100 mL IVPB  Status:  Discontinued       Note to Pharmacy: Pharmacy may adjust dosing strength / duration / interval for maximal efficacy   2 g 200 mL/hr over 30 Minutes Intravenous Every 24 hours 08/25/21 0823 08/26/21 0910   08/25/21 1000  metroNIDAZOLE (FLAGYL) tablet 500 mg  Status:  Discontinued        500 mg Oral Every 12 hours 08/25/21 0837 08/25/21 0935   07/25/2021 2000  cefoTEtan (CEFOTAN) 2 g in sodium chloride 0.9 % 100 mL IVPB        2 g 200 mL/hr over 30 Minutes Intravenous  Every 12 hours 08/06/2021 1647 08/07/2021 2046   08/06/2021 0600  cefoTEtan (CEFOTAN) 2 g in sodium chloride 0.9 % 100 mL IVPB        2 g 200 mL/hr over 30 Minutes Intravenous On call to O.R. 08/18/2021 3267 07/23/2021 0806       Assessment/Plan: s/p Procedure(s): LYSIS OF ADHESION (N/A) SMALL BOWEL RESECTION X 2 WITH END COLOSTOMY (N/A)  Severe malnutrition, prolonged ileus: pt not eating well enough to stop TPN, can't tolerate a diet, cont sips and NG for now, will clamp NG again today.  Return to [facility] if nauseated  Dehydration: cont MIV   pSBO: due to pelvic adhesions from pelvic abscess.  Pt unable to tolerate NG, IR not able to place, pt unwilling to undergo NG placement in fluoro, IR unable to place decompressive PEG tube.  Pt unable to tolerate a PO diet, taken to OR 12/14 for ex lap.  Several loops of small bowel adherent to completely dehisced anastomosis.  SBR x2 with endo colostomy, pathology c/w carcinomatosis.  Ng output remains high.  D/c Octreotide until better bowel function and pt able to come off TPN.  Will cont IV Erythromycin q6h to encourage motility  Anemia: most likely related to chronic illness and malnutrition.  Iron supplements ordered but pt did not tolerate this.  Iron levels low.  IV iron given in early Dec, tachy overnight, will recheck and give blood as needed  anastomotic dehiscence  - JP drainage catheter appears to be in appropriate position, Zosyn d/c'd 11/14.  wbc normal.  - Rpt CT 11/20 shows increased abscess size with drain in correct position.   restarted drain flushes.  No sign of systemic infection.  Discussed with IR on 11/21.  No further targets available for additional drainage.   7 day course of abx repeated.   - CT 11/28 fluid collection more organized but slightly smaller, IR placed TG drain 11/30, Zosyn x5 days -12/8: GGF enema shows anastomotic leak, controlled with drain - 12/14 complete dehischence of anastomosis found on ex lap.  Colostomy  created - 12/22 Abd JP removed CT 12/24 shows continued pelvic abscess and new RLQ fluid collection.  Will restart Zosyn and have IR eval for drainage tomorrow CT 12/29 shows improving fluid collections and continued ileus  ID: wbc elevated off abx.  Zosyn course completed and wbc normal now 1/5: IR drain advanced with good purulent output  Physical deconditioning: Ambulate in hall TID, appreciate PT/OT involvement (Pt refusing less interventions now)  Narcotic dependence: dilaudid prn for pain, will wean slowly, Palliative involved  Urinary retention: foley replaced on Mon 11/1.  foley out again 11/7, failed voiding trial 11/9 with elevated PVR.   Foley replaced, will need urology f/u as outpatient.   Pt appeared to have a neurogenic bladder during ex lap, foley out, straight cath q8h but pt develpoed increased Cr so foley replaced  Pt with forming carcinomatous on recent exlap and new liver lesions concerning for mets on most recent CT.  Dr Benay Spice and pt aware.  Nothing further to do until recovered from GI standpoint  Dispo: pt has a temporary housing plan in place at discharge in [place] with his Aunt.  TOC team consulted and working with housing authority to assist with long term housing.      LOS: 75 days    Rosario Adie 3/76/2831

## 2021-10-29 NOTE — Progress Notes (Signed)
°   10/29/21 1300  Mobility  Activity Contraindicated/medical hold   Pt had episode of N/V this AM. Hold mobility for today.  [place] Specialist Acute Rehab Services Office: [phone]

## 2021-10-29 NOTE — Progress Notes (Signed)
Patient called out that he was feeling nauseated, then vomited 300 cc's of green liquid into emesis bag, ng tube hooked back up and immediately returned almost 400 cc's of green liquid, ng tube left hooked up to LIWS, patient medicated for pain, will continue to monitor.

## 2021-10-30 LAB — COMPREHENSIVE METABOLIC PANEL
ALT: 8 U/L (ref 0–44)
AST: 11 U/L — ABNORMAL LOW (ref 15–41)
Albumin: 2.4 g/dL — ABNORMAL LOW (ref 3.5–5.0)
Alkaline Phosphatase: 54 U/L (ref 38–126)
Anion gap: 9 (ref 5–15)
BUN: 30 mg/dL — ABNORMAL HIGH (ref 8–23)
CO2: 30 mmol/L (ref 22–32)
Calcium: 8.7 mg/dL — ABNORMAL LOW (ref 8.9–10.3)
Chloride: 99 mmol/L (ref 98–111)
Creatinine, Ser: 0.63 mg/dL (ref 0.61–1.24)
GFR, Estimated: >60 mL/min (ref >60)
Glucose, Bld: 113 mg/dL — ABNORMAL HIGH (ref 70–99)
Potassium: 4.3 mmol/L (ref 3.5–5.1)
Sodium: 138 mmol/L (ref 135–145)
Total Bilirubin: 0.3 mg/dL (ref 0.3–1.2)
Total Protein: 6.6 g/dL (ref 6.5–8.1)

## 2021-10-30 LAB — PHOSPHORUS: Phosphorus: 3.6 mg/dL (ref 2.5–4.6)

## 2021-10-30 LAB — MAGNESIUM: Magnesium: 2 mg/dL (ref 1.7–2.4)

## 2021-10-30 MED ORDER — TRAVASOL 10 % IV SOLN
INTRAVENOUS | Status: AC
  Filled 2021-10-30: qty 1101.6

## 2021-10-30 NOTE — Progress Notes (Signed)
PHARMACY - TOTAL PARENTERAL NUTRITION CONSULT NOTE   Indication: intolerance to enteral feeding  Patient Measurements: Height: 5\' 6"  (167.6 cm) Weight: 60.3 kg (132 lb 15 oz) IBW/kg (Calculated) : 63.8 TPN AdjBW (KG): 60.1 Body mass index is 21.46 kg/m.  Assessment:  66 yo M presents on 10/28 for eval of rectal cancer. S/P LAR and diverting ileostomy. Pharmacy consulted to start TPN 11/5. Stopped TPN on 11/15 but had to restart on 11/17 as patient vomited as is unable to tolerate PO.  1/5: clamped NG tube for 8 hours - per RN patient tolerated  1/6: NGT clamped 1/7: Emesis overnight >500 mL. NG hooked back up to suction 1/9: NG clamped with one episode of emesis per surgery note 1/11: significant emesis then NG hooked back up and returned significant amount  Glucose / Insulin: No Hx DM.  - CBGs/SSI d/c w/ CBGs consistently at goal without insulin Electrolytes:  1/12: Lytes WNL including CorrCa (10) Renal: 1/12: SCr WNL, BUN  30 - slightly elevated and trending down Hepatic: 1/12: Albumin remains low; LFTs and Tbili WNL Prealbumin: 17.4 (1/9) TG: 12/26: WNL. 1/2: 182 slightly elevated 1/9: WNL Intake / Output:  - UOP 2325 mL, NGT/emesis: none reported; drain: 30 ml, stool: 725 ml IVF: None GI Imaging: 11/4 CT abdomen shows fluid collection, SBO, bladder thickening  11/17 KUB: Dilated small bowel loops are noted concerning for distal SBO 11/19 CT:  SBO, increase in size of gas and fluid abscess formation  11/28 CTAP: mild decrease in gas/gjuid in presacral space, no change in SBO 12/5 CTAP:  severe dilatation of small bowel loops in the abdomen concerning for ileus/obstruction 12/7 GGF enema: shows anastomotic leak, controlled with drain 12/24 CT: continued pelvic abscess and new RLQ fluid collection 12/29 CT: improved fluid collections and continued ileus 1/2 [place]: NG tube tip looped within gastric fundus, suggestive of partial SBO similar to that noted on CT 12/29 GI Surgeries /  Procedures:  10/28: LAR/Diverting ileostomy  11/30: Successful CT-guided presacral pelvic abscess drain placement 12/14: Small bowel resection x 2 with end colostomy, LOA; NGT placed for decompression  1/5: IR exchanged/repositioned transgluteal presacral drainage catheter  Central access: Implanted Port TPN start date: 11/5 >> 11/15. Resumed 11/17>>  Nutritional Goals:  Cyclic TPN:  0321 mL provides 110 g of protein and 1984 kcals per day  RD Assessment: Estimated Needs Total Energy Estimated Needs: 1950-2150 Total Protein Estimated Needs: 100-110g Total Fluid Estimated Needs: 2L/day  Current Nutrition:  1/9: CLD  TPN - 12 hour cyclic (22/48)  Plan:   At 2500: Continue cyclic TPN. Infuse 2,040 mL over 12 hrs: 93 mL/hr x 1 hr, then 185 mL/hr x 10 hrs, then 93 mL/hr x 1 hr. Electrolytes in TPN. No change Na 60 mEq/L  K 60 mEq/L Ca 3 mEq/L Mg 5 mEq/L Phos 15 mmol/L Cl:Ac max acetate Add standard MVI and trace elements to TPN TPN labs on Mon/Thurs  Napoleon Form, PharmD 10/30/21 7:26 AM

## 2021-10-30 NOTE — Progress Notes (Signed)
29 Days Post-Op LOA, SBR and end colostomy creation  Subjective/Chief Complaint: No new issues, NG clamped yesterday with no emesis last night. NG out quite a bit today.  Ambulated in hall yesterday.     Vital signs in last 24 hours: Temp:  [97.9 F (36.6 C)-98.1 F (36.7 C)] 97.9 F (36.6 C) (01/12 0550) Pulse Rate:  [88-95] 95 (01/12 0550) Resp:  [16] 16 (01/12 0550) BP: (107-111)/(67-74) 107/67 (01/12 0550) SpO2:  [98 %-100 %] 100 % (01/12 0550) Last BM Date: 10/29/21  Intake/Output from previous day: 01/11 0701 - 01/12 0700 In: 2179.9 [P.O.:240; I.V.:1498.5; IV Piggyback:431.4] Out: 3180 [Urine:2425; Drains:30; Stool:725] Intake/Output this shift: No intake/output data recorded.  General appearance: alert, cooperative, and no distress GI: non-distended, appropriately tender in lower abdomen; ostomy pink with liquid stool, colostomy viable Incisions OK, JP drain with less purulent drainage  Lab Results:  No results for input(s): WBC, HGB, HCT, PLT in the last 72 hours.  BMET Recent Labs    10/29/21 0610 10/30/21 0522  NA 133* 138  K 4.1 4.3  CL 99 99  CO2 29 30  GLUCOSE 99 113*  BUN 29* 30*  CREATININE 0.84 0.63  CALCIUM 8.3* 8.7*    PT/INR No results for input(s): LABPROT, INR in the last 72 hours. ABG No results for input(s): PHART, HCO3 in the last 72 hours.  Invalid input(s): PCO2, PO2  Studies/Results: No results found.  Anti-infectives: Anti-infectives (From admission, onward)    Start     Dose/Rate Route Frequency Ordered Stop   10/22/21 1200  erythromycin 250 mg in sodium chloride 0.9 % 100 mL IVPB        250 mg 100 mL/hr over 60 Minutes Intravenous Every 6 hours 10/22/21 0841     10/12/21 1000  piperacillin-tazobactam (ZOSYN) IVPB 3.375 g  Status:  Discontinued        3.375 g 12.5 mL/hr over 240 Minutes Intravenous Every 8 hours 10/12/21 0727 10/21/21 0855   09/21/2021 2200  cefoTEtan (CEFOTAN) 1 g in sodium chloride 0.9 % 100 mL IVPB   Status:  Discontinued        1 g 200 mL/hr over 30 Minutes Intravenous Every 12 hours 09/18/2021 1608 09/20/2021 1643   10/05/2021 2200  cefoTEtan (CEFOTAN) 2 g in sodium chloride 0.9 % 100 mL IVPB        2 g 200 mL/hr over 30 Minutes Intravenous  Once 10/06/2021 1644 10/10/2021 2212   09/20/2021 1158  sodium chloride 0.9 % with cefoTEtan (CEFOTAN) ADS Med       Note to Pharmacy: Georgena Spurling W: cabinet override      10/06/2021 1158 10/02/21 0014   09/17/21 1730  piperacillin-tazobactam (ZOSYN) IVPB 3.375 g        3.375 g 12.5 mL/hr over 240 Minutes Intravenous Every 8 hours 09/17/21 1631 09/22/21 1001   09/08/21 1400  cefTRIAXone (ROCEPHIN) 2 g in sodium chloride 0.9 % 100 mL IVPB       Note to Pharmacy: Pharmacy may adjust dosing strength / duration / interval for maximal efficacy   2 g 200 mL/hr over 30 Minutes Intravenous Every 24 hours 09/08/21 1307 09/14/21 1431   08/26/21 1000  metroNIDAZOLE (FLAGYL) IVPB 500 mg  Status:  Discontinued        500 mg 100 mL/hr over 60 Minutes Intravenous Every 12 hours 08/26/21 0830 08/26/21 0910   08/26/21 1000  piperacillin-tazobactam (ZOSYN) IVPB 3.375 g  Status:  Discontinued  3.375 g 12.5 mL/hr over 240 Minutes Intravenous Every 8 hours 08/26/21 0910 09/01/21 0819   08/25/21 1030  metroNIDAZOLE (FLAGYL) tablet 500 mg  Status:  Discontinued        500 mg Per Tube Every 12 hours 08/25/21 0935 08/26/21 0830   08/25/21 1000  cefTRIAXone (ROCEPHIN) 2 g in sodium chloride 0.9 % 100 mL IVPB  Status:  Discontinued       Note to Pharmacy: Pharmacy may adjust dosing strength / duration / interval for maximal efficacy   2 g 200 mL/hr over 30 Minutes Intravenous Every 24 hours 08/25/21 0823 08/26/21 0910   08/25/21 1000  metroNIDAZOLE (FLAGYL) tablet 500 mg  Status:  Discontinued        500 mg Oral Every 12 hours 08/25/21 0837 08/25/21 0935   07/30/2021 2000  cefoTEtan (CEFOTAN) 2 g in sodium chloride 0.9 % 100 mL IVPB        2 g 200 mL/hr over 30 Minutes  Intravenous Every 12 hours 08/12/2021 1647 08/06/2021 2046   08/10/2021 0600  cefoTEtan (CEFOTAN) 2 g in sodium chloride 0.9 % 100 mL IVPB        2 g 200 mL/hr over 30 Minutes Intravenous On call to O.R. 07/26/2021 1610 08/12/2021 0806       Assessment/Plan: s/p Procedure(s): LYSIS OF ADHESION (N/A) SMALL BOWEL RESECTION X 2 WITH END COLOSTOMY (N/A)  Severe malnutrition, prolonged ileus:  cont sips and d/c NG   Dehydration: cont MIV   pSBO: due to pelvic adhesions from pelvic abscess.  Pt unable to tolerate NG, IR not able to place, pt unwilling to undergo NG placement in fluoro, IR unable to place decompressive PEG tube.  Pt unable to tolerate a PO diet, taken to OR 12/14 for ex lap.  Several loops of small bowel adherent to completely dehisced anastomosis.  SBR x2 with endo colostomy, pathology c/w carcinomatosis.  Ng output remains high.   Will cont IV Erythromycin q6h and IV Reglan to encourage motility  Anemia: most likely related to chronic illness and malnutrition.  Iron supplements ordered but pt did not tolerate this.  Iron levels low.  IV iron given in early Dec, tachy overnight, will recheck and give blood as needed  Anastomotic dehiscence  - JP drainage catheter appears to be in appropriate position, Zosyn d/c'd 11/14.  wbc normal.  - Rpt CT 11/20 shows increased abscess size with drain in correct position.   restarted drain flushes.  No sign of systemic infection.  Discussed with IR on 11/21.  No further targets available for additional drainage.   7 day course of abx repeated.   - CT 11/28 fluid collection more organized but slightly smaller, IR placed TG drain 11/30, Zosyn x5 days -12/8: GGF enema shows anastomotic leak, controlled with drain - 12/14 complete dehischence of anastomosis found on ex lap.  Colostomy created - 12/22 Abd JP removed CT 12/24 shows continued pelvic abscess and new RLQ fluid collection.  Will restart Zosyn and have IR eval for drainage tomorrow CT 12/29  shows improving fluid collections and continued ileus  ID: wbc elevated off abx.  Zosyn course completed and wbc normal now 1/5: IR drain advanced with good purulent output  Physical deconditioning: Ambulate in hall TID, appreciate PT/OT involvement (Pt refusing less interventions now)  Narcotic dependence: dilaudid prn for pain, will wean slowly, Palliative has seen and started Fentanyl patch  Urinary retention: foley replaced on Mon 11/1.  foley out again 11/7, failed voiding trial  11/9 with elevated PVR.   Foley replaced, will need urology f/u as outpatient.   Pt appeared to have a neurogenic bladder during ex lap, foley out, straight cath q8h but pt develpoed increased Cr so foley replaced  Pt with forming carcinomatous on recent exlap and new liver lesions concerning for mets on most recent CT.  Dr Benay Spice and pt aware.  Nothing further to do until recovered from GI standpoint  Dispo: pt has a temporary housing plan in place at discharge in [place] with his Aunt.  TOC team consulted and working with housing authority to assist with long term housing.      LOS: 76 days    Rosario Adie [id number]/33/5825

## 2021-10-30 NOTE — Progress Notes (Signed)
°   10/30/21 1353  Mobility  Activity Contraindicated/medical hold   Pt currently having IV placed, hold mobility until tomorrow.   [place] [facility] Office: [phone]

## 2021-10-30 NOTE — Progress Notes (Signed)
IVT consulted for difficult PIV placement. Pt in chair and eating lunch. RN Terri to put in new consult when pt is back in bed and ready for PIV placement/assessment.

## 2021-10-30 NOTE — Plan of Care (Signed)
  Problem: Coping: Goal: Level of anxiety will decrease Outcome: Progressing   Problem: Pain Managment: Goal: General experience of comfort will improve Outcome: Progressing   

## 2021-10-31 MED ORDER — TRAVASOL 10 % IV SOLN
INTRAVENOUS | Status: AC
  Filled 2021-10-31: qty 1101.6

## 2021-10-31 NOTE — Progress Notes (Signed)
Patient vomited x 3-green color, administered prn Zofran and Compazine. Will continue to monitor.

## 2021-10-31 NOTE — Progress Notes (Signed)
PHARMACY - TOTAL PARENTERAL NUTRITION CONSULT NOTE   Indication: intolerance to enteral feeding  Patient Measurements: Height: 5\' 6"  (167.6 cm) Weight: 60.3 kg (132 lb 15 oz) IBW/kg (Calculated) : 63.8 TPN AdjBW (KG): 60.1 Body mass index is 21.46 kg/m.  Assessment:  66 yo M presents on 10/28 for eval of rectal cancer. S/P LAR and diverting ileostomy. Pharmacy consulted to start TPN 11/5. Stopped TPN on 11/15 but had to restart on 11/17 as patient vomited as is unable to tolerate PO.  1/5: clamped NG tube for 8 hours - per RN patient tolerated  1/6: NGT clamped 1/7: Emesis overnight >500 mL. NG hooked back up to suction 1/9: NG clamped with one episode of emesis per surgery note 1/11: significant emesis then NG hooked back up and returned significant amount 1/12: NG removed with vomiting overnight  Glucose / Insulin: No Hx DM.  - CBGs/SSI d/c w/ CBGs consistently at goal without insulin Electrolytes:  1/12: Lytes WNL including CorrCa (10) Renal: 1/12: SCr WNL, BUN  30 - slightly elevated and trending down Hepatic: 1/12: Albumin remains low; LFTs and Tbili WNL Prealbumin: 17.4 (1/9) TG: 12/26: WNL. 1/2: 182 slightly elevated 1/9: WNL Intake / Output:  - UOP 2325 mL, NGT/emesis: none reported; drain: 30 ml, stool: 725 ml IVF: None GI Imaging: 11/4 CT abdomen shows fluid collection, SBO, bladder thickening  11/17 KUB: Dilated small bowel loops are noted concerning for distal SBO 11/19 CT:  SBO, increase in size of gas and fluid abscess formation  11/28 CTAP: mild decrease in gas/gjuid in presacral space, no change in SBO 12/5 CTAP:  severe dilatation of small bowel loops in the abdomen concerning for ileus/obstruction 12/7 GGF enema: shows anastomotic leak, controlled with drain 12/24 CT: continued pelvic abscess and new RLQ fluid collection 12/29 CT: improved fluid collections and continued ileus 1/2 [place]: NG tube tip looped within gastric fundus, suggestive of partial SBO similar  to that noted on CT 12/29 GI Surgeries / Procedures:  10/28: LAR/Diverting ileostomy  11/30: Successful CT-guided presacral pelvic abscess drain placement 12/14: Small bowel resection x 2 with end colostomy, LOA; NGT placed for decompression  1/5: IR exchanged/repositioned transgluteal presacral drainage catheter  Central access: Implanted Port TPN start date: 11/5 >> 11/15. Resumed 11/17>>  Nutritional Goals:  Cyclic TPN:  6433 mL provides 110 g of protein and 1984 kcals per day  RD Assessment: Estimated Needs Total Energy Estimated Needs: 1950-2150 Total Protein Estimated Needs: 100-110g Total Fluid Estimated Needs: 2L/day  Current Nutrition:  1/9: CLD  TPN - 12 hour cyclic (29/51)  Plan:   At 8841: Continue cyclic TPN. Infuse 2,040 mL over 12 hrs: 93 mL/hr x 1 hr, then 185 mL/hr x 10 hrs, then 93 mL/hr x 1 hr. Electrolytes in TPN. No change Na 60 mEq/L  K 60 mEq/L Ca 3 mEq/L Mg 5 mEq/L Phos 15 mmol/L Cl:Ac max acetate Add standard MVI and trace elements to TPN TPN labs on Mon/Thurs BMET, Mg, and Phos tomorrow  Napoleon Form, PharmD 10/31/21 9:44 AM

## 2021-10-31 NOTE — Progress Notes (Signed)
30 Days Post-Op LOA, SBR and end colostomy creation  Subjective/Chief Complaint: No new issues, NG out yesterday.  Vomited last night.  Ambulated in hall this am   Vital signs in last 24 hours: Temp:  [97.7 F (36.5 C)-99 F (37.2 C)] 97.7 F (36.5 C) (01/13 0620) Pulse Rate:  [92-105] 105 (01/13 0620) Resp:  [18] 18 (01/13 0620) BP: (103-148)/(67-91) 148/91 (01/13 0620) SpO2:  [99 %-100 %] 100 % (01/13 0620) Last BM Date: 10/30/21  Intake/Output from previous day: 01/12 0701 - 01/13 0700 In: 2343.7 [P.O.:50; I.V.:1993.6; IV Piggyback:300] Out: 2855 [Urine:1625; Emesis/NG output:350; Drains:30; Stool:850] Intake/Output this shift: No intake/output data recorded.  General appearance: alert, cooperative, and no distress GI: non-distended, appropriately tender in lower abdomen; ostomy pink with liquid stool, colostomy viable Incisions OK, JP drain with less purulent drainage  Lab Results:  No results for input(s): WBC, HGB, HCT, PLT in the last 72 hours.  BMET Recent Labs    10/29/21 0610 10/30/21 0522  NA 133* 138  K 4.1 4.3  CL 99 99  CO2 29 30  GLUCOSE 99 113*  BUN 29* 30*  CREATININE 0.84 0.63  CALCIUM 8.3* 8.7*    PT/INR No results for input(s): LABPROT, INR in the last 72 hours. ABG No results for input(s): PHART, HCO3 in the last 72 hours.  Invalid input(s): PCO2, PO2  Studies/Results: No results found.  Anti-infectives: Anti-infectives (From admission, onward)    Start     Dose/Rate Route Frequency Ordered Stop   10/22/21 1200  erythromycin 250 mg in sodium chloride 0.9 % 100 mL IVPB  Status:  Discontinued        250 mg 100 mL/hr over 60 Minutes Intravenous Every 6 hours 10/22/21 0841 10/31/21 0742   10/12/21 1000  piperacillin-tazobactam (ZOSYN) IVPB 3.375 g  Status:  Discontinued        3.375 g 12.5 mL/hr over 240 Minutes Intravenous Every 8 hours 10/12/21 0727 10/21/21 0855   09/29/2021 2200  cefoTEtan (CEFOTAN) 1 g in sodium chloride 0.9 % 100  mL IVPB  Status:  Discontinued        1 g 200 mL/hr over 30 Minutes Intravenous Every 12 hours 10/13/2021 1608 09/27/2021 1643   10/11/2021 2200  cefoTEtan (CEFOTAN) 2 g in sodium chloride 0.9 % 100 mL IVPB        2 g 200 mL/hr over 30 Minutes Intravenous  Once 09/29/2021 1644 10/18/2021 2212   10/13/2021 1158  sodium chloride 0.9 % with cefoTEtan (CEFOTAN) ADS Med       Note to Pharmacy: Phillip Brooks W: cabinet override      09/28/2021 1158 10/02/21 0014   09/17/21 1730  piperacillin-tazobactam (ZOSYN) IVPB 3.375 g        3.375 g 12.5 mL/hr over 240 Minutes Intravenous Every 8 hours 09/17/21 1631 09/22/21 1001   09/08/21 1400  cefTRIAXone (ROCEPHIN) 2 g in sodium chloride 0.9 % 100 mL IVPB       Note to Pharmacy: Pharmacy may adjust dosing strength / duration / interval for maximal efficacy   2 g 200 mL/hr over 30 Minutes Intravenous Every 24 hours 09/08/21 1307 09/14/21 1431   08/26/21 1000  metroNIDAZOLE (FLAGYL) IVPB 500 mg  Status:  Discontinued        500 mg 100 mL/hr over 60 Minutes Intravenous Every 12 hours 08/26/21 0830 08/26/21 0910   08/26/21 1000  piperacillin-tazobactam (ZOSYN) IVPB 3.375 g  Status:  Discontinued        3.375  g 12.5 mL/hr over 240 Minutes Intravenous Every 8 hours 08/26/21 0910 09/01/21 0819   08/25/21 1030  metroNIDAZOLE (FLAGYL) tablet 500 mg  Status:  Discontinued        500 mg Per Tube Every 12 hours 08/25/21 0935 08/26/21 0830   08/25/21 1000  cefTRIAXone (ROCEPHIN) 2 g in sodium chloride 0.9 % 100 mL IVPB  Status:  Discontinued       Note to Pharmacy: Pharmacy may adjust dosing strength / duration / interval for maximal efficacy   2 g 200 mL/hr over 30 Minutes Intravenous Every 24 hours 08/25/21 0823 08/26/21 0910   08/25/21 1000  metroNIDAZOLE (FLAGYL) tablet 500 mg  Status:  Discontinued        500 mg Oral Every 12 hours 08/25/21 0837 08/25/21 0935   08/13/2021 2000  cefoTEtan (CEFOTAN) 2 g in sodium chloride 0.9 % 100 mL IVPB        2 g 200 mL/hr over 30  Minutes Intravenous Every 12 hours 07/21/2021 1647 08/18/2021 2046   08/02/2021 0600  cefoTEtan (CEFOTAN) 2 g in sodium chloride 0.9 % 100 mL IVPB        2 g 200 mL/hr over 30 Minutes Intravenous On call to O.R. 07/28/2021 8101 07/28/2021 0806       Assessment/Plan: s/p Procedure(s): LYSIS OF ADHESION (N/A) SMALL BOWEL RESECTION X 2 WITH END COLOSTOMY (N/A)  Severe malnutrition, prolonged ileus:  cont sips and d/c NG   Dehydration: cont MIV   pSBO: due to pelvic adhesions from pelvic abscess.  Pt unable to tolerate NG, IR not able to place, pt unwilling to undergo NG placement in fluoro, IR unable to place decompressive PEG tube.  Pt unable to tolerate a PO diet, taken to OR 12/14 for ex lap.  Several loops of small bowel adherent to completely dehisced anastomosis.  SBR x2 with endo colostomy, pathology c/w carcinomatosis.  Ng output remains high.   Will d/c IV Erythromycin q6h and cont IV Reglan to encourage motility  Anemia: most likely related to chronic illness and malnutrition.  Iron supplements ordered but pt did not tolerate this.  Iron levels low.  IV iron given in early Dec, tachy overnight, will recheck and give blood as needed  Anastomotic dehiscence  - JP drainage catheter appears to be in appropriate position, Zosyn d/c'd 11/14.  wbc normal.  - Rpt CT 11/20 shows increased abscess size with drain in correct position.   restarted drain flushes.  No sign of systemic infection.  Discussed with IR on 11/21.  No further targets available for additional drainage.   7 day course of abx repeated.   - CT 11/28 fluid collection more organized but slightly smaller, IR placed TG drain 11/30, Zosyn x5 days -12/8: GGF enema shows anastomotic leak, controlled with drain - 12/14 complete dehischence of anastomosis found on ex lap.  Colostomy created - 12/22 Abd JP removed CT 12/24 shows continued pelvic abscess and new RLQ fluid collection.  Will restart Zosyn and have IR eval for drainage  tomorrow CT 12/29 shows improving fluid collections and continued ileus  ID: wbc elevated off abx.  Zosyn course completed and wbc normal now 1/5: IR drain advanced with good purulent output  Physical deconditioning: Ambulate in hall TID, appreciate PT/OT involvement (Pt refusing less interventions now)  Narcotic dependence: dilaudid prn for pain, will wean slowly, Palliative has seen and started Fentanyl patch  Urinary retention: foley replaced on Mon 11/1.  foley out again 11/7, failed voiding trial  11/9 with elevated PVR.   Foley replaced, will need urology f/u as outpatient.   Pt appeared to have a neurogenic bladder during ex lap, foley out, straight cath q8h but pt develpoed increased Cr so foley replaced  Pt with forming carcinomatous on recent exlap and new liver lesions concerning for mets on most recent CT.  Dr Benay Spice and pt aware.  Nothing further to do until recovered from GI standpoint  Dispo: pt has a temporary housing plan in place at discharge in [place] with his Aunt.  TOC team consulted and working with housing authority to assist with long term housing.      LOS: 77 days    Phillip Brooks 04/15/6380

## 2021-10-31 NOTE — Progress Notes (Signed)
Referring Physician(s): Dr. Joyice Faster  Supervising Physician: Daryll Brod  Patient Status:  [facility] - In-pt  Chief Complaint: Chronic presacral abscess s/p transgluteal drain placement 09/17/21 with exchange 10/23/21  Subjective: Patient in bed, just finished working with the mobility specialist. He has no complaints at this time.   Allergies: Patient has no known allergies.  Medications: Prior to Admission medications   Medication Sig Start Date End Date Taking? Authorizing Provider  acetaminophen (TYLENOL) 650 MG CR tablet Take 650 mg by mouth every 8 (eight) hours as needed for pain.   Yes [provider]  oxyCODONE (OXY IR/ROXICODONE) 5 MG immediate release tablet Take 1 tablet (5 mg total) by mouth every 6 (six) hours as needed for severe pain. 08/11/21  Yes Owens Shark, NP  traMADol (ULTRAM) 50 MG tablet Take 50 mg by mouth every 12 (twelve) hours as needed for moderate pain. 06/25/21   [provider]     Vital Signs: BP 139/80 (BP Location: Left Arm)    Pulse 97    Temp 98.9 F (37.2 C)    Resp 18    Ht 5\' 6"  (1.676 m)    Wt 132 lb 15 oz (60.3 kg)    SpO2 100%    BMI 21.46 kg/m   Physical Exam Constitutional:      General: He is not in acute distress.    Appearance: He is ill-appearing.  Pulmonary:     Effort: Pulmonary effort is normal.  Abdominal:     Comments: Left transgluteal drain to suction. Tan/purulent material in catheter/bulb. Suture/stat-lock intact. Dressing is loose and needs to be changed.   Neurological:     Mental Status: He is alert and oriented to person, place, and time.    Imaging: No results found.  Labs:  CBC: Recent Labs    10/16/21 0555 10/17/21 0621 10/20/21 0039 10/27/21 0422  WBC 10.7* 10.5 8.6 7.9  HGB 7.0* 8.2* 8.3* 8.2*  HCT 24.8* 26.9* 27.0* 26.9*  PLT 603* 530* 500* 353    COAGS: Recent Labs    12/06/20 1706  INR 1.1*    BMP: Recent Labs    10/26/21 0451 10/27/21 0422 10/29/21 0610  10/30/21 0522  NA 137 137 133* 138  K 4.2 4.1 4.1 4.3  CL 98 98 99 99  CO2 30 30 29 30   GLUCOSE 127* 139* 99 113*  BUN 35* 33* 29* 30*  CALCIUM 8.9 8.9 8.3* 8.7*  CREATININE 0.88 0.65 0.84 0.63  GFRNONAA >60 >60 >60 >60    LIVER FUNCTION TESTS: Recent Labs    10/20/21 0039 10/23/21 0336 10/27/21 0422 10/30/21 0522  BILITOT 0.4 0.5 0.2* 0.3  AST 15 15 15  11*  ALT 17 13 9 8   ALKPHOS 61 67 52 54  PROT 6.6 7.4 7.2 6.6  ALBUMIN 2.3* 2.7* 2.5* 2.4*    Assessment and Plan:  Chronic presacral abscess s/p transgluteal drain placement 09/17/21 with exchange 10/23/21  Drain Location: transgluteal  Size: Fr size: 10 Fr Date of placement: 10/23/21 Currently to: Drain collection device: suction bulb 24 hour output:  Output by Drain (mL) 10/29/21 0701 - 10/29/21 1900 10/29/21 1901 - 10/30/21 0700 10/30/21 0701 - 10/30/21 1900 10/30/21 1901 - 10/31/21 0700 10/31/21 0701 - 10/31/21 1402  Closed System Drain Left Buttock Bulb (JP) 10.2 Fr.  30 30 0     Interval imaging/drain manipulation:  None  Current examination:  Insertion site unremarkable. Suture and stat lock in place. Dressing loose -  needs to be changed.   Plan: Continue TID flushes with 5 cc NS. Record output Q shift. Dressing changes QD or PRN if soiled.  Call IR APP or on call IR MD if difficulty flushing or sudden change in drain output.  Repeat imaging/possible drain injection once output < 10 mL/QD (excluding flush material.)  Discharge planning: Please contact IR APP or on call IR MD prior to patient d/c to ensure appropriate follow up plans are in place. Typically patient will follow up with IR clinic 10-14 days post d/c for repeat imaging/possible drain injection. IR scheduler will contact patient with date/time of appointment. Patient will need to flush drain QD with 5 cc NS, record output QD, dressing changes every 2-3 days or earlier if soiled.   IR will continue to follow - please call with questions or  concerns.  Electronically Signed: Soyla Dryer, AGACNP-BC [phone] 10/31/2021, 1:56 PM   I spent a total of 15 Minutes at the the patient's bedside AND on the patient's hospital floor or unit, greater than 50% of which was counseling/coordinating care for transgluteal abscess drain

## 2021-10-31 NOTE — Progress Notes (Signed)
°   10/31/21 1400  Mobility  Activity Ambulated in hall  Level of Assistance Contact guard assist, steadying assist  Assistive Device Front wheel walker  Distance Ambulated (ft) 160 ft  Mobility Ambulated with assistance in hallway  Mobility Response Tolerated well  Mobility performed by Mobility specialist  $Mobility charge 1 Mobility   Pt agreeable to mobilize this afternoon. Pt sat EOB for several minutes prior to session, secondary to stomach pains. He ambulated about 115ft in hall with RW, tolerated well. Pt requested to wash up upon return. Assisted pt with making soapy water basin. Pt washed up in chair. Assisted pt back to bed. Left call bell at side. NP present in room.  [place] [facility] Acute [facility] Services Office: [phone]

## 2021-11-01 ENCOUNTER — Inpatient Hospital Stay (HOSPITAL_COMMUNITY): Payer: Medicare HMO

## 2021-11-01 LAB — BASIC METABOLIC PANEL
Anion gap: 9 (ref 5–15)
BUN: 29 mg/dL — ABNORMAL HIGH (ref 8–23)
CO2: 31 mmol/L (ref 22–32)
Calcium: 9.1 mg/dL (ref 8.9–10.3)
Chloride: 98 mmol/L (ref 98–111)
Creatinine, Ser: 0.65 mg/dL (ref 0.61–1.24)
GFR, Estimated: >60 mL/min (ref >60)
Glucose, Bld: 174 mg/dL — ABNORMAL HIGH (ref 70–99)
Potassium: 4.2 mmol/L (ref 3.5–5.1)
Sodium: 138 mmol/L (ref 135–145)

## 2021-11-01 LAB — PHOSPHORUS: Phosphorus: 2.7 mg/dL (ref 2.5–4.6)

## 2021-11-01 LAB — MAGNESIUM: Magnesium: 2.1 mg/dL (ref 1.7–2.4)

## 2021-11-01 MED ORDER — IOHEXOL 9 MG/ML PO SOLN: 500.0000 mL | ORAL | Status: AC

## 2021-11-01 MED ORDER — IOHEXOL 350 MG/ML SOLN
80.0000 mL | Freq: Once | INTRAVENOUS | Status: AC | PRN
  Administered 2021-11-01: 80 mL via INTRAVENOUS

## 2021-11-01 MED ORDER — IOHEXOL 9 MG/ML PO SOLN
ORAL | Status: AC
  Filled 2021-11-01: qty 1000

## 2021-11-01 MED ORDER — TRAVASOL 10 % IV SOLN
INTRAVENOUS | Status: AC
  Filled 2021-11-01: qty 1101.6

## 2021-11-01 NOTE — Progress Notes (Signed)
PHARMACY - TOTAL PARENTERAL NUTRITION CONSULT NOTE   Indication: intolerance to enteral feeding  Patient Measurements: Height: 5\' 6"  (167.6 cm) Weight: 60.3 kg (132 lb 15 oz) IBW/kg (Calculated) : 63.8 TPN AdjBW (KG): 60.1 Body mass index is 21.46 kg/m.  Assessment:  66 yo M presents on 10/28 for eval of rectal cancer. S/P LAR and diverting ileostomy. Pharmacy consulted to start TPN 11/5. Stopped TPN on 11/15 but had to restart on 11/17 as patient vomited as is unable to tolerate PO.  1/5: clamped NG tube for 8 hours - per RN patient tolerated  1/6: NGT clamped 1/7: Emesis overnight >500 mL. NG hooked back up to suction 1/9: NG clamped with one episode of emesis per surgery note 1/11: significant emesis then NG hooked back up and returned significant amount 1/12: NG removed with vomiting overnight  Glucose / Insulin: No Hx DM.  - CBGs/SSI d/c w/ CBGs consistently at goal without insulin Electrolytes:  1/14: Lytes WNL Renal: 1/14: SCr WNL, BUN  29 - slightly elevated and trending down Hepatic: 1/12: Albumin remains low; LFTs and Tbili WNL Prealbumin: 17.4 (1/9) TG: 12/26: WNL. 1/2: 182 slightly elevated 1/9: WNL Intake / Output:  - UOP 3.2 L, Emesis 625 mL, drain none reported; stool: 925 ml IVF: None GI Imaging: 11/4 CT abdomen shows fluid collection, SBO, bladder thickening  11/17 KUB: Dilated small bowel loops are noted concerning for distal SBO 11/19 CT:  SBO, increase in size of gas and fluid abscess formation  11/28 CTAP: mild decrease in gas/gjuid in presacral space, no change in SBO 12/5 CTAP:  severe dilatation of small bowel loops in the abdomen concerning for ileus/obstruction 12/7 GGF enema: shows anastomotic leak, controlled with drain 12/24 CT: continued pelvic abscess and new RLQ fluid collection 12/29 CT: improved fluid collections and continued ileus 1/2 [place]: NG tube tip looped within gastric fundus, suggestive of partial SBO similar to that noted on CT  12/29 1/14 CT:   GI Surgeries / Procedures:  10/28: LAR/Diverting ileostomy  11/30: Successful CT-guided presacral pelvic abscess drain placement 12/14: Small bowel resection x 2 with end colostomy, LOA; NGT placed for decompression  1/5: IR exchanged/repositioned transgluteal presacral drainage catheter  Central access: Implanted Port TPN start date: 11/5 >> 11/15. Resumed 11/17>>  Nutritional Goals:  Cyclic TPN:  2563 mL provides 110 g of protein and 1984 kcals per day  RD Assessment: Estimated Needs Total Energy Estimated Needs: 1950-2150 Total Protein Estimated Needs: 100-110g Total Fluid Estimated Needs: 2L/day  Current Nutrition:  1/9: CLD  TPN - 12 hour cyclic (89/37)  Plan:   At 3428: Continue cyclic TPN:  Infuse 7,681 mL over 12 hrs: 93 mL/hr x 1 hr, then 185 mL/hr x 10 hrs, then 93 mL/hr x 1 hr. Electrolytes in TPN. No change Na 60 mEq/L  K 60 mEq/L Ca 3 mEq/L Mg 5 mEq/L Phos 15 mmol/L Cl:Ac max acetate Add standard MVI and trace elements to TPN TPN labs on Mon/Thurs   Gretta Arab PharmD, BCPS Clinical Pharmacist [facility] main pharmacy [phone] 11/01/2021 10:28 AM

## 2021-11-01 NOTE — Progress Notes (Signed)
31 Days Post-Op LOA, SBR and end colostomy creation  Subjective/Chief Complaint: No new issues.  Vomited last night.  Feels better this AM.  Ambulated in hall this am   Vital signs in last 24 hours: Temp:  [98.3 F (36.8 C)-98.9 F (37.2 C)] 98.4 F (36.9 C) (01/14 0533) Pulse Rate:  [92-104] 104 (01/14 0533) Resp:  [18] 18 (01/14 0533) BP: (126-139)/(79-88) 126/79 (01/14 0533) SpO2:  [99 %-100 %] 100 % (01/14 0533) Last BM Date: 10/30/21  Intake/Output from previous day: 01/13 0701 - 01/14 0700 In: 1744.4 [P.O.:320; I.V.:1369.4] Out: 4300 [Urine:2800; Emesis/NG output:625; Stool:875] Intake/Output this shift: No intake/output data recorded.  General appearance: alert, cooperative, and no distress GI: non-distended, appropriately tender in lower abdomen; ostomy pink with liquid stool, colostomy viable Incisions OK, JP drain with less purulent drainage  Lab Results:  No results for input(s): WBC, HGB, HCT, PLT in the last 72 hours.  BMET Recent Labs    10/30/21 0522 11/01/21 0505  NA 138 138  K 4.3 4.2  CL 99 98  CO2 30 31  GLUCOSE 113* 174*  BUN 30* 29*  CREATININE 0.63 0.65  CALCIUM 8.7* 9.1    PT/INR No results for input(s): LABPROT, INR in the last 72 hours. ABG No results for input(s): PHART, HCO3 in the last 72 hours.  Invalid input(s): PCO2, PO2  Studies/Results: No results found.  Anti-infectives: Anti-infectives (From admission, onward)    Start     Dose/Rate Route Frequency Ordered Stop   10/22/21 1200  erythromycin 250 mg in sodium chloride 0.9 % 100 mL IVPB  Status:  Discontinued        250 mg 100 mL/hr over 60 Minutes Intravenous Every 6 hours 10/22/21 0841 10/31/21 0742   10/12/21 1000  piperacillin-tazobactam (ZOSYN) IVPB 3.375 g  Status:  Discontinued        3.375 g 12.5 mL/hr over 240 Minutes Intravenous Every 8 hours 10/12/21 0727 10/21/21 0855   09/29/2021 2200  cefoTEtan (CEFOTAN) 1 g in sodium chloride 0.9 % 100 mL IVPB  Status:   Discontinued        1 g 200 mL/hr over 30 Minutes Intravenous Every 12 hours 10/13/2021 1608 10/13/2021 1643   09/30/2021 2200  cefoTEtan (CEFOTAN) 2 g in sodium chloride 0.9 % 100 mL IVPB        2 g 200 mL/hr over 30 Minutes Intravenous  Once 10/12/2021 1644 10/03/2021 2212   10/18/2021 1158  sodium chloride 0.9 % with cefoTEtan (CEFOTAN) ADS Med       Note to Pharmacy: Georgena Spurling W: cabinet override      10/14/2021 1158 10/02/21 0014   09/17/21 1730  piperacillin-tazobactam (ZOSYN) IVPB 3.375 g        3.375 g 12.5 mL/hr over 240 Minutes Intravenous Every 8 hours 09/17/21 1631 09/22/21 1001   09/08/21 1400  cefTRIAXone (ROCEPHIN) 2 g in sodium chloride 0.9 % 100 mL IVPB       Note to Pharmacy: Pharmacy may adjust dosing strength / duration / interval for maximal efficacy   2 g 200 mL/hr over 30 Minutes Intravenous Every 24 hours 09/08/21 1307 09/14/21 1431   08/26/21 1000  metroNIDAZOLE (FLAGYL) IVPB 500 mg  Status:  Discontinued        500 mg 100 mL/hr over 60 Minutes Intravenous Every 12 hours 08/26/21 0830 08/26/21 0910   08/26/21 1000  piperacillin-tazobactam (ZOSYN) IVPB 3.375 g  Status:  Discontinued        3.375 g  12.5 mL/hr over 240 Minutes Intravenous Every 8 hours 08/26/21 0910 09/01/21 0819   08/25/21 1030  metroNIDAZOLE (FLAGYL) tablet 500 mg  Status:  Discontinued        500 mg Per Tube Every 12 hours 08/25/21 0935 08/26/21 0830   08/25/21 1000  cefTRIAXone (ROCEPHIN) 2 g in sodium chloride 0.9 % 100 mL IVPB  Status:  Discontinued       Note to Pharmacy: Pharmacy may adjust dosing strength / duration / interval for maximal efficacy   2 g 200 mL/hr over 30 Minutes Intravenous Every 24 hours 08/25/21 0823 08/26/21 0910   08/25/21 1000  metroNIDAZOLE (FLAGYL) tablet 500 mg  Status:  Discontinued        500 mg Oral Every 12 hours 08/25/21 0837 08/25/21 0935   08/13/2021 2000  cefoTEtan (CEFOTAN) 2 g in sodium chloride 0.9 % 100 mL IVPB        2 g 200 mL/hr over 30 Minutes Intravenous  Every 12 hours 07/24/2021 1647 07/19/2021 2046   08/09/2021 0600  cefoTEtan (CEFOTAN) 2 g in sodium chloride 0.9 % 100 mL IVPB        2 g 200 mL/hr over 30 Minutes Intravenous On call to O.R. 07/23/2021 [id number] 08/16/2021 0806       Assessment/Plan: s/p Procedure(s): LYSIS OF ADHESION (N/A) SMALL BOWEL RESECTION X 2 WITH END COLOSTOMY (N/A)  Severe malnutrition, prolonged ileus:  cont sips and d/c NG   Dehydration: cont MIV   pSBO: due to pelvic adhesions from pelvic abscess.  Pt unable to tolerate NG, IR not able to place, pt unwilling to undergo NG placement in fluoro, IR unable to place decompressive PEG tube.  Pt unable to tolerate a PO diet, taken to OR 12/14 for ex lap.  Several loops of small bowel adherent to completely dehisced anastomosis.  SBR x2 with endo colostomy, pathology c/w carcinomatosis.  Ng output remains high.   Will d/c IV Erythromycin q6h and cont IV Reglan to encourage motility  Anemia: most likely related to chronic illness and malnutrition.  Iron supplements ordered but pt did not tolerate this.  Iron levels low.  IV iron given in early Dec, tachy overnight, will recheck and give blood as needed  Anastomotic dehiscence  - JP drainage catheter appears to be in appropriate position, Zosyn d/c'd 11/14.  wbc normal.  - Rpt CT 11/20 shows increased abscess size with drain in correct position.   restarted drain flushes.  No sign of systemic infection.  Discussed with IR on 11/21.  No further targets available for additional drainage.   7 day course of abx repeated.   - CT 11/28 fluid collection more organized but slightly smaller, IR placed TG drain 11/30, Zosyn x5 days -12/8: GGF enema shows anastomotic leak, controlled with drain - 12/14 complete dehischence of anastomosis found on ex lap.  Colostomy created - 12/22 Abd JP removed CT 12/24 shows continued pelvic abscess and new RLQ fluid collection.  Will restart Zosyn and have IR eval for drainage tomorrow CT 12/29 shows  improving fluid collections and continued ileus  ID: wbc elevated off abx.  Zosyn course completed and wbc normal now 1/5: IR drain advanced with good purulent output CT 1/14: will re-eval abd today  Physical deconditioning: Ambulate in hall TID, appreciate PT/OT involvement (Pt refusing less interventions now)  Narcotic dependence: dilaudid prn for pain, will wean slowly, Palliative has seen and started Fentanyl and Lidoderm patch  Urinary retention: foley replaced on Mon 11/1.  foley out again 11/7, failed voiding trial 11/9 with elevated PVR.   Foley replaced, will need urology f/u as outpatient.   Pt appeared to have a neurogenic bladder during ex lap, foley out, straight cath q8h but pt develpoed increased Cr so foley replaced, Cr normalized  Pt with forming carcinomatous on recent exlap and new liver lesions concerning for mets on most recent CT.  Dr Benay Spice and pt aware.  Nothing further to do until recovered from GI standpoint  Dispo: pt has a temporary housing plan in place at discharge in [place] with his Aunt.  TOC team consulted and working with housing authority to assist with long term housing.      LOS: 78 days    Phillip Brooks 02/19/5464

## 2021-11-01 NOTE — Progress Notes (Signed)
Patient vomited 400 cc of light green liquid, PRN Zofran given. We will continue to monitor.

## 2021-11-01 NOTE — Progress Notes (Signed)
Patient vomited 200 cc more of light green fluid, PRN Compazine given this time. We will continue to monitor.

## 2021-11-02 MED ORDER — TRAVASOL 10 % IV SOLN
INTRAVENOUS | Status: AC
  Filled 2021-11-02: qty 1101.6

## 2021-11-02 MED ORDER — SIMETHICONE 40 MG/0.6ML PO SUSP
40.0000 mg | Freq: Four times a day (QID) | ORAL | Status: DC | PRN
  Filled 2021-11-02: qty 0.6

## 2021-11-02 NOTE — Progress Notes (Signed)
PHARMACY - TOTAL PARENTERAL NUTRITION CONSULT NOTE   Indication: intolerance to enteral feeding  Patient Measurements: Height: 5\' 6"  (167.6 cm) Weight: 60.3 kg (132 lb 15 oz) IBW/kg (Calculated) : 63.8 TPN AdjBW (KG): 60.1 Body mass index is 21.46 kg/m.  Assessment:  67 yo M presents on 10/28 for eval of rectal cancer. S/P LAR and diverting ileostomy. Pharmacy consulted to start TPN 11/5. Stopped TPN on 11/15 but had to restart on 11/17 as patient vomited as is unable to tolerate PO.  1/5: clamped NG tube for 8 hours - per RN patient tolerated  1/7: Emesis overnight >500 mL. NG hooked back up to suction 1/9: NG clamped with one episode of emesis per surgery note 1/11: significant emesis then NG to suction with significant amount out 1/12: NG removed with vomiting overnight  Glucose / Insulin: No Hx DM.  - CBGs/SSI d/c w/ CBGs consistently at goal without insulin Electrolytes:  1/14: Lytes WNL Renal: 1/14: SCr WNL, BUN  29 - slightly elevated and trending down Hepatic: 1/12: Albumin remains low; LFTs and Tbili WNL Prealbumin: 17.4 (1/9) TG: 12/26: WNL. 1/2: 182 slightly elevated 1/9: WNL Intake / Output: net I/O -1.5 L -Output:  Urine 2.5 L, Emesis 500 mL, drain 30 mL, stool 375 ml, blood 300 mL IVF: None GI Imaging: 11/4 CT abdomen shows fluid collection, SBO, bladder thickening  11/17 KUB: Dilated small bowel loops are noted concerning for distal SBO 11/19 CT:  SBO, increase in size of gas and fluid abscess formation  11/28 CTAP: mild decrease in gas/gjuid in presacral space, no change in SBO 12/5 CTAP:  severe dilatation of small bowel loops in the abdomen concerning for ileus/obstruction 12/7 GGF enema: shows anastomotic leak, controlled with drain 12/24 CT: continued pelvic abscess and new RLQ fluid collection 12/29 CT: improved fluid collections and continued ileus 1/2 [place]: NG tube tip looped within gastric fundus, suggestive of partial SBO similar to that noted on CT  12/29 1/14 CT:  shows continued bowel obstruction  GI Surgeries / Procedures:  10/28: LAR/Diverting ileostomy  11/30: Successful CT-guided presacral pelvic abscess drain placement 12/14: Small bowel resection x 2 with end colostomy, LOA; NGT placed for decompression  1/5: IR exchanged/repositioned transgluteal presacral drainage catheter  Central access: Implanted Port TPN start date: 11/5 >> 11/15. Resumed 11/17>>  Nutritional Goals:  Cyclic TPN:  4481 mL provides 110 g of protein and 1984 kcals per day  RD Assessment: Estimated Needs Total Energy Estimated Needs: 1950-2150 Total Protein Estimated Needs: 100-110g Total Fluid Estimated Needs: 2L/day  Current Nutrition:  1/9: CLD  TPN - 12 hour cyclic (85/63)  Plan:   At 1497: Continue cyclic TPN:  Infuse 0,263 mL over 12 hrs: 93 mL/hr x 1 hr, then 185 mL/hr x 10 hrs, then 93 mL/hr x 1 hr. Electrolytes in TPN. No change Na 60 mEq/L  K 60 mEq/L Ca 3 mEq/L Mg 5 mEq/L Phos 15 mmol/L Cl:Ac max acetate Add standard MVI and trace elements to TPN TPN labs on Mon/Thurs   Gretta Arab PharmD, BCPS Clinical Pharmacist [facility] main pharmacy [phone] 11/02/2021 10:20 AM

## 2021-11-02 NOTE — Progress Notes (Signed)
32 Days Post-Op LOA, SBR and end colostomy creation  Subjective/Chief Complaint: No new issues.  Vomited last night.  Feels better this AM.  Ambulated in hall   Vital signs in last 24 hours: Temp:  [98.1 F (36.7 C)-98.4 F (36.9 C)] 98.4 F (36.9 C) (01/14 2146) Pulse Rate:  [106] 106 (01/14 2146) Resp:  [14-18] 18 (01/14 2146) BP: (142-148)/(94-100) 142/94 (01/14 2146) SpO2:  [100 %] 100 % (01/14 2146) Last BM Date: 10/30/21  Intake/Output from previous day: 01/14 0701 - 01/15 0700 In: 2207.3 [P.O.:360; I.V.:1847.3] Out: 3705 [Urine:2500; Emesis/NG output:500; Drains:30; Stool:375; Blood:300] Intake/Output this shift: No intake/output data recorded.  General appearance: alert, cooperative, and no distress GI: non-distended, appropriately tender in lower abdomen; ostomy pink with liquid stool, colostomy viable Incisions OK, JP drain with less purulent drainage  Lab Results:  No results for input(s): WBC, HGB, HCT, PLT in the last 72 hours.  BMET Recent Labs    11/01/21 0505  NA 138  K 4.2  CL 98  CO2 31  GLUCOSE 174*  BUN 29*  CREATININE 0.65  CALCIUM 9.1    PT/INR No results for input(s): LABPROT, INR in the last 72 hours. ABG No results for input(s): PHART, HCO3 in the last 72 hours.  Invalid input(s): PCO2, PO2  Studies/Results: CT ABDOMEN PELVIS W CONTRAST  Result Date: 11/01/2021 CLINICAL DATA:  History of colon cancer, previous low anterior resection with diverting colostomy and right lower quadrant ileostomy. EXAM: CT ABDOMEN AND PELVIS WITH CONTRAST TECHNIQUE: Multidetector CT imaging of the abdomen and pelvis was performed using the standard protocol following bolus administration of intravenous contrast. RADIATION DOSE REDUCTION: This exam was performed according to the departmental dose-optimization program which includes automated exposure control, adjustment of the mA and/or kV according to patient size and/or use of iterative reconstruction  technique. CONTRAST:  55mL OMNIPAQUE IOHEXOL 350 MG/ML SOLN COMPARISON:  10/16/2021 FINDINGS: Lower chest: Hypoventilatory changes are seen at the lung bases. No acute pleural or parenchymal lung disease. Hepatobiliary: Multiple hepatic hypodensities are again identified, with interval increase in size consistent with progression of disease. Index lesions are as follows: Right lobe, image 20/2, 2.2 cm.  Previously 1.6 cm. Caudate, image 20/2, 2.5 cm.  Previously 1.7 cm. Left lobe, image 24/2, 2.3 cm.  Previously 1.8 cm. Right lobe, image 27/2, 2.1 cm.  Previously 1.4 cm. Gallbladder is unremarkable.  No biliary duct dilation. Pancreas: Unremarkable. No pancreatic ductal dilatation or surrounding inflammatory changes. Spleen: Normal in size without focal abnormality. Adrenals/Urinary Tract: Foley catheter decompresses the bladder. Kidneys enhance normally and symmetrically. No urinary tract calculi or obstructive uropathy. Adrenals are grossly normal. Stomach/Bowel: Progressive small bowel dilation, now measuring up to 6.6 cm within the right mid abdomen. Distal small bowel just proximal to the right lower quadrant ileostomy is decompressed. Findings are consistent with small-bowel obstruction. Colon is decompressed, with residual oral contrast again identified. Left lower quadrant colostomy unchanged. Vascular/Lymphatic: Stable atherosclerosis. No discrete adenopathy within the abdomen or pelvis. Reproductive: Prostate is mildly enlarged but stable. Other: Presacral gas and fluid collection measuring 3.6 x 4.9 cm consistent with chronic abscess. Indwelling percutaneous pigtail drainage catheter unchanged. Minimal decrease in size since prior study. No free intraperitoneal fluid or free gas. No abdominal wall hernia. Musculoskeletal: No acute or destructive bony lesions. Reconstructed images demonstrate no additional findings. IMPRESSION: 1. High-grade small bowel obstruction, transition within the distal small bowel  just proximal to the right lower quadrant ileostomy. 2. Progressive hepatic metastases, with increased size  of liver lesions since prior study. 3. Grossly stable presacral abscess with indwelling pigtail drainage catheter. Electronically Signed   By: Randa Ngo M.D.   On: 11/01/2021 18:46    Anti-infectives: Anti-infectives (From admission, onward)    Start     Dose/Rate Route Frequency Ordered Stop   10/22/21 1200  erythromycin 250 mg in sodium chloride 0.9 % 100 mL IVPB  Status:  Discontinued        250 mg 100 mL/hr over 60 Minutes Intravenous Every 6 hours 10/22/21 0841 10/31/21 0742   10/12/21 1000  piperacillin-tazobactam (ZOSYN) IVPB 3.375 g  Status:  Discontinued        3.375 g 12.5 mL/hr over 240 Minutes Intravenous Every 8 hours 10/12/21 0727 10/21/21 0855   09/24/2021 2200  cefoTEtan (CEFOTAN) 1 g in sodium chloride 0.9 % 100 mL IVPB  Status:  Discontinued        1 g 200 mL/hr over 30 Minutes Intravenous Every 12 hours 10/08/2021 1608 09/19/2021 1643   09/21/2021 2200  cefoTEtan (CEFOTAN) 2 g in sodium chloride 0.9 % 100 mL IVPB        2 g 200 mL/hr over 30 Minutes Intravenous  Once 09/23/2021 1644 09/27/2021 2212   09/30/2021 1158  sodium chloride 0.9 % with cefoTEtan (CEFOTAN) ADS Med       Note to Pharmacy: Georgena Spurling W: cabinet override      10/09/2021 1158 10/02/21 0014   09/17/21 1730  piperacillin-tazobactam (ZOSYN) IVPB 3.375 g        3.375 g 12.5 mL/hr over 240 Minutes Intravenous Every 8 hours 09/17/21 1631 09/22/21 1001   09/08/21 1400  cefTRIAXone (ROCEPHIN) 2 g in sodium chloride 0.9 % 100 mL IVPB       Note to Pharmacy: Pharmacy may adjust dosing strength / duration / interval for maximal efficacy   2 g 200 mL/hr over 30 Minutes Intravenous Every 24 hours 09/08/21 1307 09/14/21 1431   08/26/21 1000  metroNIDAZOLE (FLAGYL) IVPB 500 mg  Status:  Discontinued        500 mg 100 mL/hr over 60 Minutes Intravenous Every 12 hours 08/26/21 0830 08/26/21 0910   08/26/21 1000   piperacillin-tazobactam (ZOSYN) IVPB 3.375 g  Status:  Discontinued        3.375 g 12.5 mL/hr over 240 Minutes Intravenous Every 8 hours 08/26/21 0910 09/01/21 0819   08/25/21 1030  metroNIDAZOLE (FLAGYL) tablet 500 mg  Status:  Discontinued        500 mg Per Tube Every 12 hours 08/25/21 0935 08/26/21 0830   08/25/21 1000  cefTRIAXone (ROCEPHIN) 2 g in sodium chloride 0.9 % 100 mL IVPB  Status:  Discontinued       Note to Pharmacy: Pharmacy may adjust dosing strength / duration / interval for maximal efficacy   2 g 200 mL/hr over 30 Minutes Intravenous Every 24 hours 08/25/21 0823 08/26/21 0910   08/25/21 1000  metroNIDAZOLE (FLAGYL) tablet 500 mg  Status:  Discontinued        500 mg Oral Every 12 hours 08/25/21 0837 08/25/21 0935   07/27/2021 2000  cefoTEtan (CEFOTAN) 2 g in sodium chloride 0.9 % 100 mL IVPB        2 g 200 mL/hr over 30 Minutes Intravenous Every 12 hours 08/12/2021 1647 08/17/2021 2046   07/30/2021 0600  cefoTEtan (CEFOTAN) 2 g in sodium chloride 0.9 % 100 mL IVPB        2 g 200 mL/hr over 30 Minutes Intravenous  On call to O.R. 08/04/2021 0513 07/19/2021 0806       Assessment/Plan: s/p Procedure(s): LYSIS OF ADHESION (N/A) SMALL BOWEL RESECTION X 2 WITH END COLOSTOMY (N/A)  Severe malnutrition, prolonged ileus:  cont sips and d/c NG   Dehydration: cont MIV   pSBO: due to pelvic adhesions from pelvic abscess.  Pt unable to tolerate NG, IR not able to place, pt unwilling to undergo NG placement in fluoro, IR unable to place decompressive PEG tube.  Pt unable to tolerate a PO diet, taken to OR 12/14 for ex lap.  Several loops of small bowel adherent to completely dehisced anastomosis.  SBR x2 with endo colostomy, pathology c/w carcinomatosis.  Ng output remains high.   Will d/c IV Erythromycin q6h and cont IV Reglan to encourage motility  Anemia: most likely related to chronic illness and malnutrition.  Iron supplements ordered but pt did not tolerate this.  Iron levels low.  IV  iron given in early Dec, tachy overnight, will recheck and give blood as needed  Anastomotic dehiscence  - JP drainage catheter appears to be in appropriate position, Zosyn d/c'd 11/14.  wbc normal.  - Rpt CT 11/20 shows increased abscess size with drain in correct position.   restarted drain flushes.  No sign of systemic infection.  Discussed with IR on 11/21.  No further targets available for additional drainage.   7 day course of abx repeated.   - CT 11/28 fluid collection more organized but slightly smaller, IR placed TG drain 11/30, Zosyn x5 days -12/8: GGF enema shows anastomotic leak, controlled with drain - 12/14 complete dehischence of anastomosis found on ex lap.  Colostomy created - 12/22 Abd JP removed CT 12/24 shows continued pelvic abscess and new RLQ fluid collection.  Will restart Zosyn and have IR eval for drainage tomorrow CT 12/29 shows improving fluid collections and continued ileus  ID: wbc elevated off abx.  Zosyn course completed and wbc normal now 1/5: IR drain advanced with good purulent output CT 1/14: shows continued bowel obstruction with TP in RLQ  Physical deconditioning: Ambulate in hall TID, appreciate PT/OT involvement (Pt refusing less interventions now)  Narcotic dependence: dilaudid prn for pain, will wean slowly, Palliative has seen and started Fentanyl and Lidoderm patch  Urinary retention: foley replaced on Mon 11/1.  foley out again 11/7, failed voiding trial 11/9 with elevated PVR.   Foley replaced, will need urology f/u as outpatient.   Pt appeared to have a neurogenic bladder during ex lap, foley out, straight cath q8h but pt develpoed increased Cr so foley replaced, Cr normalized  Pt with forming carcinomatous on recent exlap and new liver lesions concerning for mets on most recent CT.  Dr Benay Spice and pt aware.  Nothing further to do until recovered from GI standpoint  Dispo: pt has a temporary housing plan in place at discharge in [place] with  his Aunt.  TOC team consulted and working with housing authority to assist with long term housing.      LOS: 79 days    Phillip Brooks [id number]

## 2021-11-03 ENCOUNTER — Inpatient Hospital Stay (HOSPITAL_COMMUNITY): Payer: Medicare HMO

## 2021-11-03 LAB — COMPREHENSIVE METABOLIC PANEL WITH GFR
ALT: 37 U/L (ref 0–44)
AST: 39 U/L (ref 15–41)
Albumin: 2.7 g/dL — ABNORMAL LOW (ref 3.5–5.0)
Alkaline Phosphatase: 73 U/L (ref 38–126)
Anion gap: 10 (ref 5–15)
BUN: 31 mg/dL — ABNORMAL HIGH (ref 8–23)
CO2: 31 mmol/L (ref 22–32)
Calcium: 9.2 mg/dL (ref 8.9–10.3)
Chloride: 99 mmol/L (ref 98–111)
Creatinine, Ser: 0.73 mg/dL (ref 0.61–1.24)
GFR, Estimated: >60 mL/min
Glucose, Bld: 155 mg/dL — ABNORMAL HIGH (ref 70–99)
Potassium: 4 mmol/L (ref 3.5–5.1)
Sodium: 140 mmol/L (ref 135–145)
Total Bilirubin: 0.3 mg/dL (ref 0.3–1.2)
Total Protein: 7.5 g/dL (ref 6.5–8.1)

## 2021-11-03 LAB — CBC WITH DIFFERENTIAL/PLATELET
Abs Immature Granulocytes: 0.05 10*3/uL (ref 0.00–0.07)
Basophils Absolute: 0 10*3/uL (ref 0.0–0.1)
Basophils Relative: 0 %
Eosinophils Absolute: 0.2 10*3/uL (ref 0.0–0.5)
Eosinophils Relative: 2 %
HCT: 27.8 % — ABNORMAL LOW (ref 39.0–52.0)
Hemoglobin: 8.3 g/dL — ABNORMAL LOW (ref 13.0–17.0)
Immature Granulocytes: 1 %
Lymphocytes Relative: 9 %
Lymphs Abs: 0.7 10*3/uL (ref 0.7–4.0)
MCH: 26.1 pg (ref 26.0–34.0)
MCHC: 29.9 g/dL — ABNORMAL LOW (ref 30.0–36.0)
MCV: 87.4 fL (ref 80.0–100.0)
Monocytes Absolute: 0.7 10*3/uL (ref 0.1–1.0)
Monocytes Relative: 9 %
Neutro Abs: 6.6 10*3/uL (ref 1.7–7.7)
Neutrophils Relative %: 79 %
Platelets: 399 10*3/uL (ref 150–400)
RBC: 3.18 MIL/uL — ABNORMAL LOW (ref 4.22–5.81)
RDW: 17.4 % — ABNORMAL HIGH (ref 11.5–15.5)
WBC: 8.2 10*3/uL (ref 4.0–10.5)
nRBC: 0 % (ref 0.0–0.2)

## 2021-11-03 LAB — MAGNESIUM: Magnesium: 2.2 mg/dL (ref 1.7–2.4)

## 2021-11-03 LAB — PREALBUMIN: Prealbumin: 20.2 mg/dL (ref 18–38)

## 2021-11-03 LAB — PHOSPHORUS: Phosphorus: 3.6 mg/dL (ref 2.5–4.6)

## 2021-11-03 LAB — TRIGLYCERIDES: Triglycerides: 104 mg/dL

## 2021-11-03 MED ORDER — TRAVASOL 10 % IV SOLN
INTRAVENOUS | Status: AC
  Filled 2021-11-03: qty 1101.6

## 2021-11-03 NOTE — Progress Notes (Signed)
PHARMACY - TOTAL PARENTERAL NUTRITION CONSULT NOTE   Indication: intolerance to enteral feeding  Patient Measurements: Height: 5\' 6"  (167.6 cm) Weight: 60.2 kg (132 lb 11.5 oz) IBW/kg (Calculated) : 63.8 TPN AdjBW (KG): 60.1 Body mass index is 21.42 kg/m.  Assessment:  66 yo M presents on 10/28 for eval of rectal cancer. S/P LAR and diverting ileostomy. Pharmacy consulted to start TPN 11/5. Stopped TPN on 11/15 but had to restart on 11/17 as patient vomited as is unable to tolerate PO.  1/5: clamped NG tube for 8 hours - per RN patient tolerated  1/7: Emesis overnight >500 mL. NG hooked back up to suction 1/9: NG clamped with one episode of emesis per surgery note 1/11: significant emesis then NG to suction with significant amount out 1/12: NG removed with vomiting overnight  Glucose / Insulin: No Hx DM.  - CBGs/SSI d/c w/ CBGs consistently at goal without insulin Electrolytes:  1/14: Lytes WNL Renal: SCr WNL, BUN  31 - slightly elevated   Hepatic: 1/16: Albumin remains low, improving ; LFTs WNL but slightly up and Tbili WNL Prealbumin: 17.4 (1/9) TG: 12/26: WNL. 1/2: 182 slightly elevated 1/9: WNL Intake / Output: net I/O -1.5 L -Output:  Urine 1950 mL, Emesis 200 mL, drain 20 mL, stool not charted ,   IVF: None GI Imaging: 11/4 CT abdomen shows fluid collection, SBO, bladder thickening  11/17 KUB: Dilated small bowel loops are noted concerning for distal SBO 11/19 CT:  SBO, increase in size of gas and fluid abscess formation  11/28 CTAP: mild decrease in gas/gjuid in presacral space, no change in SBO 12/5 CTAP:  severe dilatation of small bowel loops in the abdomen concerning for ileus/obstruction 12/7 GGF enema: shows anastomotic leak, controlled with drain 12/24 CT: continued pelvic abscess and new RLQ fluid collection 12/29 CT: improved fluid collections and continued ileus 1/2 [place]: NG tube tip looped within gastric fundus, suggestive of partial SBO similar to that noted on  CT 12/29 1/14 CT:  shows continued bowel obstruction  GI Surgeries / Procedures:  10/28: LAR/Diverting ileostomy  11/30: Successful CT-guided presacral pelvic abscess drain placement 12/14: Small bowel resection x 2 with end colostomy, LOA; NGT placed for decompression  1/5: IR exchanged/repositioned transgluteal presacral drainage catheter  Central access: Implanted Port TPN start date: 11/5 >> 11/15. Resumed 11/17>>  Nutritional Goals:  Cyclic TPN:  5397 mL provides 110 g of protein and 1984 kcals per day  RD Assessment: Estimated Needs Total Energy Estimated Needs: 1950-2150 Total Protein Estimated Needs: 100-110g Total Fluid Estimated Needs: 2L/day  Current Nutrition:  1/9: CLD  TPN - 12 hour cyclic (67/34)  Plan:   At 1937: Continue cyclic TPN:  Infuse 9,024 mL over 12 hrs: 93 mL/hr x 1 hr, then 185 mL/hr x 10 hrs, then 93 mL/hr x 1 hr. Electrolytes in TPN. No change Na 60 mEq/L  K 60 mEq/L Ca 3 mEq/L Mg 5 mEq/L Phos 15 mmol/L Cl:Ac max acetate Add standard MVI and trace elements to TPN BMP, magnesium, phosphorus with AM labs  TPN labs on Mon/Thurs   Royetta Asal, PharmD, BCPS 11/03/2021 10:15 AM

## 2021-11-03 NOTE — Progress Notes (Signed)
Attempted to place NG with no success. Notified MD, okayed to have it placed by fluoroscopy.

## 2021-11-03 NOTE — Progress Notes (Signed)
Patient asked for NG tube to be put back in, stated that " it will make me feel bette I'M hurting too bad" . Explained to the patient that an order from a Doctor is needed. Paged on call provider Dr. Kae Heller and a verbal order was given that the can have it if he wants. Updated that he can have an NG tube,patient remember that they had a hard time putting one in and that he wants a doctor to do it . Patient stated that he will wait until the doctors do their rounds in the morning. We will continue to monitor.

## 2021-11-03 NOTE — Progress Notes (Signed)
33 Days Post-Op LOA, SBR and end colostomy creation  Subjective/Chief Complaint: Having more pain. Vomited last night.  Ambulated in hall   Vital signs in last 24 hours: Temp:  [97.4 F (36.3 C)-98.6 F (37 C)] 98.6 F (37 C) (01/16 0447) Pulse Rate:  [103-117] 107 (01/16 0447) Resp:  [14-20] 20 (01/16 0447) BP: (126-169)/(88-96) 126/88 (01/16 0447) SpO2:  [95 %-100 %] 100 % (01/16 0447) Weight:  [60.2 kg] 60.2 kg (01/16 0314) Last BM Date: 11/02/21  Intake/Output from previous day: 01/15 0701 - 01/16 0700 In: 2525.5 [P.O.:630; I.V.:1895.5] Out: 2370 [KKXFG:1829; Emesis/NG output:200; Drains:20; HBZJI:967] Intake/Output this shift: No intake/output data recorded.  General appearance: alert, cooperative, and no distress GI: non-distended, appropriately tender in lower abdomen; ostomy pink with  some liquid stool, colostomy viable Incisions OK, JP drain with less purulent drainage  Lab Results:  Recent Labs    11/03/21 0412  WBC 8.2  HGB 8.3*  HCT 27.8*  PLT 399    BMET Recent Labs    11/01/21 0505 11/03/21 0412  NA 138 140  K 4.2 4.0  CL 98 99  CO2 31 31  GLUCOSE 174* 155*  BUN 29* 31*  CREATININE 0.65 0.73  CALCIUM 9.1 9.2    PT/INR No results for input(s): LABPROT, INR in the last 72 hours. ABG No results for input(s): PHART, HCO3 in the last 72 hours.  Invalid input(s): PCO2, PO2  Studies/Results: CT ABDOMEN PELVIS W CONTRAST  Result Date: 11/01/2021 CLINICAL DATA:  History of colon cancer, previous low anterior resection with diverting colostomy and right lower quadrant ileostomy. EXAM: CT ABDOMEN AND PELVIS WITH CONTRAST TECHNIQUE: Multidetector CT imaging of the abdomen and pelvis was performed using the standard protocol following bolus administration of intravenous contrast. RADIATION DOSE REDUCTION: This exam was performed according to the departmental dose-optimization program which includes automated exposure control, adjustment of the mA  and/or kV according to patient size and/or use of iterative reconstruction technique. CONTRAST:  67mL OMNIPAQUE IOHEXOL 350 MG/ML SOLN COMPARISON:  10/16/2021 FINDINGS: Lower chest: Hypoventilatory changes are seen at the lung bases. No acute pleural or parenchymal lung disease. Hepatobiliary: Multiple hepatic hypodensities are again identified, with interval increase in size consistent with progression of disease. Index lesions are as follows: Right lobe, image 20/2, 2.2 cm.  Previously 1.6 cm. Caudate, image 20/2, 2.5 cm.  Previously 1.7 cm. Left lobe, image 24/2, 2.3 cm.  Previously 1.8 cm. Right lobe, image 27/2, 2.1 cm.  Previously 1.4 cm. Gallbladder is unremarkable.  No biliary duct dilation. Pancreas: Unremarkable. No pancreatic ductal dilatation or surrounding inflammatory changes. Spleen: Normal in size without focal abnormality. Adrenals/Urinary Tract: Foley catheter decompresses the bladder. Kidneys enhance normally and symmetrically. No urinary tract calculi or obstructive uropathy. Adrenals are grossly normal. Stomach/Bowel: Progressive small bowel dilation, now measuring up to 6.6 cm within the right mid abdomen. Distal small bowel just proximal to the right lower quadrant ileostomy is decompressed. Findings are consistent with small-bowel obstruction. Colon is decompressed, with residual oral contrast again identified. Left lower quadrant colostomy unchanged. Vascular/Lymphatic: Stable atherosclerosis. No discrete adenopathy within the abdomen or pelvis. Reproductive: Prostate is mildly enlarged but stable. Other: Presacral gas and fluid collection measuring 3.6 x 4.9 cm consistent with chronic abscess. Indwelling percutaneous pigtail drainage catheter unchanged. Minimal decrease in size since prior study. No free intraperitoneal fluid or free gas. No abdominal wall hernia. Musculoskeletal: No acute or destructive bony lesions. Reconstructed images demonstrate no additional findings. IMPRESSION: 1.  High-grade small bowel  obstruction, transition within the distal small bowel just proximal to the right lower quadrant ileostomy. 2. Progressive hepatic metastases, with increased size of liver lesions since prior study. 3. Grossly stable presacral abscess with indwelling pigtail drainage catheter. Electronically Signed   By: Randa Ngo M.D.   On: 11/01/2021 18:46    Anti-infectives: Anti-infectives (From admission, onward)    Start     Dose/Rate Route Frequency Ordered Stop   10/22/21 1200  erythromycin 250 mg in sodium chloride 0.9 % 100 mL IVPB  Status:  Discontinued        250 mg 100 mL/hr over 60 Minutes Intravenous Every 6 hours 10/22/21 0841 10/31/21 0742   10/12/21 1000  piperacillin-tazobactam (ZOSYN) IVPB 3.375 g  Status:  Discontinued        3.375 g 12.5 mL/hr over 240 Minutes Intravenous Every 8 hours 10/12/21 0727 10/21/21 0855   09/29/2021 2200  cefoTEtan (CEFOTAN) 1 g in sodium chloride 0.9 % 100 mL IVPB  Status:  Discontinued        1 g 200 mL/hr over 30 Minutes Intravenous Every 12 hours 10/02/2021 1608 10/18/2021 1643   10/15/2021 2200  cefoTEtan (CEFOTAN) 2 g in sodium chloride 0.9 % 100 mL IVPB        2 g 200 mL/hr over 30 Minutes Intravenous  Once 09/18/2021 1644 09/18/2021 2212   09/30/2021 1158  sodium chloride 0.9 % with cefoTEtan (CEFOTAN) ADS Med       Note to Pharmacy: Georgena Spurling W: cabinet override      09/27/2021 1158 10/02/21 0014   09/17/21 1730  piperacillin-tazobactam (ZOSYN) IVPB 3.375 g        3.375 g 12.5 mL/hr over 240 Minutes Intravenous Every 8 hours 09/17/21 1631 09/22/21 1001   09/08/21 1400  cefTRIAXone (ROCEPHIN) 2 g in sodium chloride 0.9 % 100 mL IVPB       Note to Pharmacy: Pharmacy may adjust dosing strength / duration / interval for maximal efficacy   2 g 200 mL/hr over 30 Minutes Intravenous Every 24 hours 09/08/21 1307 09/14/21 1431   08/26/21 1000  metroNIDAZOLE (FLAGYL) IVPB 500 mg  Status:  Discontinued        500 mg 100 mL/hr over 60 Minutes  Intravenous Every 12 hours 08/26/21 0830 08/26/21 0910   08/26/21 1000  piperacillin-tazobactam (ZOSYN) IVPB 3.375 g  Status:  Discontinued        3.375 g 12.5 mL/hr over 240 Minutes Intravenous Every 8 hours 08/26/21 0910 09/01/21 0819   08/25/21 1030  metroNIDAZOLE (FLAGYL) tablet 500 mg  Status:  Discontinued        500 mg Per Tube Every 12 hours 08/25/21 0935 08/26/21 0830   08/25/21 1000  cefTRIAXone (ROCEPHIN) 2 g in sodium chloride 0.9 % 100 mL IVPB  Status:  Discontinued       Note to Pharmacy: Pharmacy may adjust dosing strength / duration / interval for maximal efficacy   2 g 200 mL/hr over 30 Minutes Intravenous Every 24 hours 08/25/21 0823 08/26/21 0910   08/25/21 1000  metroNIDAZOLE (FLAGYL) tablet 500 mg  Status:  Discontinued        500 mg Oral Every 12 hours 08/25/21 0837 08/25/21 0935   08/05/2021 2000  cefoTEtan (CEFOTAN) 2 g in sodium chloride 0.9 % 100 mL IVPB        2 g 200 mL/hr over 30 Minutes Intravenous Every 12 hours 08/09/2021 1647 07/30/2021 2046   08/04/2021 0600  cefoTEtan (CEFOTAN) 2 g in  sodium chloride 0.9 % 100 mL IVPB        2 g 200 mL/hr over 30 Minutes Intravenous On call to O.R. 07/22/2021 5102 08/08/2021 0806       Assessment/Plan: s/p Procedure(s): LYSIS OF ADHESION (N/A) SMALL BOWEL RESECTION X 2 WITH END COLOSTOMY (N/A)  Severe malnutrition, prolonged ileus:  cont sips and d/c NG   Dehydration: cont MIV   pSBO: due to pelvic adhesions from pelvic abscess.  Pt unable to tolerate NG, IR not able to place, pt unwilling to undergo NG placement in fluoro, IR unable to place decompressive PEG tube.  Pt unable to tolerate a PO diet, taken to OR 12/14 for ex lap.  Several loops of small bowel adherent to completely dehisced anastomosis.  SBR x2 with endo colostomy, pathology c/w carcinomatosis.  NG removed 1/12 but pt became increasingly more distended.  Anemia: most likely related to chronic illness and malnutrition.  Iron supplements ordered but pt did not  tolerate this.  Iron levels low.  IV iron given in early Dec, tachy overnight, will recheck and give blood as needed  Anastomotic dehiscence  - JP drainage catheter appears to be in appropriate position, Zosyn d/c'd 11/14.  wbc normal.  - Rpt CT 11/20 shows increased abscess size with drain in correct position.   restarted drain flushes.  No sign of systemic infection.  Discussed with IR on 11/21.  No further targets available for additional drainage.   7 day course of abx repeated.   - CT 11/28 fluid collection more organized but slightly smaller, IR placed TG drain 11/30, Zosyn x5 days -12/8: GGF enema shows anastomotic leak, controlled with drain - 12/14 complete dehischence of anastomosis found on ex lap.  Colostomy created - 12/22 Abd JP removed CT 12/24 shows continued pelvic abscess and new RLQ fluid collection.  Will restart Zosyn and have IR eval for drainage tomorrow CT 12/29 shows improving fluid collections and continued ileus  ID: wbc elevated off abx.  Zosyn course completed and wbc normal now 1/5: IR drain advanced with good purulent output CT 1/14: shows continued bowel obstruction with TP in RLQ  Physical deconditioning: Ambulate in hall TID, appreciate PT/OT involvement (Pt refusing less interventions now)  Narcotic dependence: dilaudid prn for pain, will wean slowly, Palliative has seen and started Fentanyl and Lidoderm patch  Urinary retention: foley replaced on Mon 11/1.  foley out again 11/7, failed voiding trial 11/9 with elevated PVR.   Foley replaced, will need urology f/u as outpatient.   Pt appeared to have a neurogenic bladder during ex lap, foley out, straight cath q8h but pt develpoed increased Cr so foley replaced, Cr normalized  Pt with forming carcinomatous on recent exlap and new liver lesions concerning for mets on most recent CT.  Dr Benay Spice and pt aware.  Nothing further to do until recovered from GI standpoint  Dispo: pt has a temporary housing plan in  place at discharge in [place] with his Aunt.  TOC team consulted and working with housing authority to assist with long term housing.      LOS: 80 days    Rosario Adie [id number]/85/2778

## 2021-11-04 DIAGNOSIS — C20 Malignant neoplasm of rectum: Secondary | ICD-10-CM | POA: Diagnosis not present

## 2021-11-04 DIAGNOSIS — G620 Drug-induced polyneuropathy: Secondary | ICD-10-CM | POA: Diagnosis not present

## 2021-11-04 DIAGNOSIS — G893 Neoplasm related pain (acute) (chronic): Secondary | ICD-10-CM | POA: Diagnosis not present

## 2021-11-04 DIAGNOSIS — R634 Abnormal weight loss: Secondary | ICD-10-CM | POA: Diagnosis not present

## 2021-11-04 LAB — BASIC METABOLIC PANEL
Anion gap: 7 (ref 5–15)
BUN: 33 mg/dL — ABNORMAL HIGH (ref 8–23)
CO2: 34 mmol/L — ABNORMAL HIGH (ref 22–32)
Calcium: 9.3 mg/dL (ref 8.9–10.3)
Chloride: 98 mmol/L (ref 98–111)
Creatinine, Ser: 0.84 mg/dL (ref 0.61–1.24)
GFR, Estimated: >60 mL/min (ref >60)
Glucose, Bld: 123 mg/dL — ABNORMAL HIGH (ref 70–99)
Potassium: 3.9 mmol/L (ref 3.5–5.1)
Sodium: 139 mmol/L (ref 135–145)

## 2021-11-04 LAB — MAGNESIUM: Magnesium: 2.3 mg/dL (ref 1.7–2.4)

## 2021-11-04 LAB — PHOSPHORUS: Phosphorus: 3.8 mg/dL (ref 2.5–4.6)

## 2021-11-04 MED ORDER — TRAVASOL 10 % IV SOLN
INTRAVENOUS | Status: AC
  Filled 2021-11-04: qty 1101.6

## 2021-11-04 MED ORDER — HYDROMORPHONE HCL 1 MG/ML IJ SOLN
1.0000 mg | INTRAMUSCULAR | Status: DC | PRN
  Administered 2021-11-04 (×4): 2 mg via INTRAVENOUS
  Administered 2021-11-04: 1 mg via INTRAVENOUS
  Administered 2021-11-05 (×3): 2 mg via INTRAVENOUS
  Administered 2021-11-05: 1 mg via INTRAVENOUS
  Administered 2021-11-05 (×2): 2 mg via INTRAVENOUS
  Filled 2021-11-04: qty 1
  Filled 2021-11-04: qty 2
  Filled 2021-11-04: qty 1
  Filled 2021-11-04 (×8): qty 2

## 2021-11-04 NOTE — Progress Notes (Signed)
PHARMACY - TOTAL PARENTERAL NUTRITION CONSULT NOTE   Indication: intolerance to enteral feeding  Patient Measurements: Height: 5\' 6"  (167.6 cm) Weight: 60.2 kg (132 lb 11.5 oz) IBW/kg (Calculated) : 63.8 TPN AdjBW (KG): 60.1 Body mass index is 21.42 kg/m.  Assessment:  66 yo M presents on 10/28 for eval of rectal cancer. S/P LAR and diverting ileostomy. Pharmacy consulted to start TPN 11/5. Stopped TPN on 11/15 but had to restart on 11/17 as patient vomited as is unable to tolerate PO.  1/5: clamped NG tube for 8 hours - per RN patient tolerated  1/7: Emesis overnight >500 mL. NG hooked back up to suction 1/9: NG clamped with one episode of emesis per surgery note 1/11: significant emesis then NG to suction with significant amount out 1/12: NG removed with vomiting overnight  Glucose / Insulin: No Hx DM.  - CBGs/SSI d/c w/ CBGs consistently at goal without insulin Electrolytes:  1/14: Lytes WNL Renal: SCr WNL, BUN  33 - slightly elevated   Hepatic: 1/16: Albumin remains low, improving ; LFTs WNL but slightly up and Tbili WNL Prealbumin: 17.4 (1/9) TG: 12/26: WNL. 1/2: 182 slightly elevated 1/9: WNL Intake / Output: net I/O -563 mL -Output:  Urine 1900 mL, drain 30 mL, stool not charted ,   IVF: None GI Imaging: 11/4 CT abdomen shows fluid collection, SBO, bladder thickening  11/17 KUB: Dilated small bowel loops are noted concerning for distal SBO 11/19 CT:  SBO, increase in size of gas and fluid abscess formation  11/28 CTAP: mild decrease in gas/gjuid in presacral space, no change in SBO 12/5 CTAP:  severe dilatation of small bowel loops in the abdomen concerning for ileus/obstruction 12/7 GGF enema: shows anastomotic leak, controlled with drain 12/24 CT: continued pelvic abscess and new RLQ fluid collection 12/29 CT: improved fluid collections and continued ileus 1/2 [place]: NG tube tip looped within gastric fundus, suggestive of partial SBO similar to that noted on CT  12/29 1/14 CT:  shows continued bowel obstruction  GI Surgeries / Procedures:  10/28: LAR/Diverting ileostomy  11/30: Successful CT-guided presacral pelvic abscess drain placement 12/14: Small bowel resection x 2 with end colostomy, LOA; NGT placed for decompression  1/5: IR exchanged/repositioned transgluteal presacral drainage catheter  Central access: Implanted Port TPN start date: 11/5 >> 11/15. Resumed 11/17>>  Nutritional Goals:  Cyclic TPN:  8115 mL provides 110 g of protein and 1984 kcals per day  RD Assessment: Estimated Needs Total Energy Estimated Needs: 1950-2150 Total Protein Estimated Needs: 100-110g Total Fluid Estimated Needs: 2L/day  Current Nutrition:  1/9: CLD  TPN - 12 hour cyclic (72/62)  Plan:   At 0355: Continue cyclic TPN:  Infuse 9,741 mL over 12 hrs: 93 mL/hr x 1 hr, then 185 mL/hr x 10 hrs, then 93 mL/hr x 1 hr. Electrolytes in TPN. No change Na 60 mEq/L  K 60 mEq/L Ca 3 mEq/L Mg 5 mEq/L Phos 15 mmol/L Cl:Ac max acetate Add standard MVI and trace elements to TPN Since labs have been stable and no changes made to TPN will not order labs for 1/18  TPN labs on Mon/Thurs   Royetta Asal, PharmD, BCPS 11/04/2021 10:34 AM

## 2021-11-04 NOTE — Progress Notes (Signed)
Nutrition Follow-up  DOCUMENTATION CODES:   Severe malnutrition in context of chronic illness  INTERVENTION:   -Cyclic TPN management per Pharmacy  NUTRITION DIAGNOSIS:   Severe Malnutrition related to chronic illness, cancer and cancer related treatments as evidenced by severe fat depletion, severe muscle depletion.  Ongoing.  GOAL:   Patient will meet greater than or equal to 90% of their needs  Meeting with TPN  MONITOR:   Diet advancement, Labs, Weight trends, Skin, I & O's, Other (Comment) (TPN), GOC  ASSESSMENT:   66 year old male who presented on 10/28 for surgery. PMH of stage III rectal cancer s/p chemotherapy and radiation.  10/28 - s/p cystoscopy with balloon dilation of bulbar urethral stricture and bilateral ureteral catheterization, lower anterior colon resection with diverting ileostomy; clear liquids 10/29 - NPO 10/30 - full liquids 10/31 - NPO 11/2 - NGT placed with significant output 11/4 - CT abdomen showing fluid collection, SBO, bladder thickening 11/5 - TPN started 11/7 - NGT removed 11/9 - CLD 11/10 - FLD 11/11 - Soft diet  11/15 - TPN stopped 11/17 - emesis x2, TPN restarted 11/30 -IR placed TG drain 12/14- s/p LOA, SB resection x 2 w/ end colostomy, NGT replaced 1/12: NGT removed   Continues on clears. NGT was replaced on 1/16.  Per chart review, venting G tube now being discussed again.   Cyclic TPN continues x 12 hours, providing 1983 kcals and 110g protein.   Admission weight: 131 lbs. Current weight:132 lbs.  Medications: Remeron, Zofran  Labs reviewed:  TG: 104  Diet Order:   Diet Order             Diet clear liquid Room service appropriate? Yes; Fluid consistency: Thin  Diet effective now                   EDUCATION NEEDS:   Education needs have been addressed  Skin:  Skin Assessment: Skin Integrity Issues: Skin Integrity Issues:: Incisions Incisions: abdomen  Last BM:  1/17  Height:   Ht Readings from  Last 1 Encounters:  09/30/2021 5\' 6"  (1.676 m)    Weight:   Wt Readings from Last 1 Encounters:  11/03/21 60.2 kg    BMI:  Body mass index is 21.42 kg/m.  Estimated Nutritional Needs:   Kcal:  1950-2150  Protein:  100-110g  Fluid:  2L/day  Clayton Bibles, MS, RD, LDN Inpatient Clinical Dietitian Contact information available via Amion

## 2021-11-04 NOTE — Consult Note (Signed)
[place] Nurse ostomy follow up Patient receiving care in [place]. Patient is very weak today. Both the ileostomy pouch (RUQ) and the colostomy pouch (LUQ) are intact, without washout, no signs of impending leakage, therefore, not changed. Supplies are in the cabinet over the sink. The ileostomy stoma is small in size, pink, moist, and slightly prolapsed--producing liquid stool.  The colostomy stoma is brown, flat, non-productive and pouched with a one piece flat system.  No new needs identified at this time.  Val Riles, RN, MSN, CWOCN, CNS-BC, pager [phone]

## 2021-11-04 NOTE — Progress Notes (Signed)
Following patient for progression and noted that surgical and oncologic team feel that this is likely not going to improve. Patient has been inpatient for 81 days and not made significant improvements. I will plan on setting up a family meeting to discuss hospice options per Dr. Marcello Moores note on 1/17. A transition to hospice care will require him to discontinue TPN, would be extremely helpful if a venting gastrostomy tube could be placed if technically feasible and would allow [place] to remove the NG tube that will open up possible discharge options for him. His pain continues to be reasonably well managed-but due to lack of GI route for PRN administration he will need ongoing IV medications most likely- will discuss possible transition to a sublingual prn-he is on fentanyl TD for basal pain control.  In terms of family meeting I anticipate the family would like to be able to speak with both surgery and oncology-either in person or by phone when we discuss the progression of his care.   We have certainly given Mr. Ruddick every best chance to improve as well as time -but he continues to languish here in the hospital- it is taking a toll on him physically and emotionally and all medical and surgical options for treating him, with the exception of comfort care have been exhausted.  Lane Hacker, DO Palliative Medicine

## 2021-11-04 NOTE — Progress Notes (Signed)
Oncology Discharge Planning Admission Note  [facility] at [facility] Address: [address], [place], [place] [address] Hours of Operation:  Nena Polio, Monday - Friday  Clinic Contact Information:  [phone]  Oncology Care Team: Medical Oncologist:  Betsy Coder  Assessment completed at bedside by Myrtha Mantis, NP.  Hospital admission dated 07/19/2021, and the cancer center will follow Teressa Senter inpatient care to assist with discharge planning as indicated by the oncologist.  We will reach out to closer to discharge date to arrange follow up care.  Disclaimer:  This [place] note does not imply a formal consult request has been made by the admitting attending for this admission or there will be an inpatient consult completed by oncology.  Please request oncology consults as per standard process as indicated.

## 2021-11-04 NOTE — Progress Notes (Addendum)
IP PROGRESS NOTE  Subjective:   Phillip Brooks has remained hospitalized since undergoing a robotic assisted low anterior resection and diverting ileostomy on 08/17/2021.  He underwent lysis of adhesion and small bowel resection x2 with end colostomy on 09/25/2021.  He has had prolonged ileus and severe malnutrition.  On TPN.  NG tube replaced 11/03/2021.  Reported increased abdominal pain this morning.  No vomiting reported.  General surgery considering PEG tube placement for drainage.  Objective: Vital signs in last 24 hours: Blood pressure (!) 136/95, pulse 86, temperature 98.6 F (37 C), temperature source Oral, resp. rate 18, height '5\' 6"'  (1.676 m), weight 60.2 kg, SpO2 100 %.  Intake/Output from previous day: 01/16 0701 - 01/17 0700 In: 2066.6 [P.O.:60; I.V.:2006.6] Out: 2630 [Urine:1900; Drains:30; Stool:700]  Physical Exam:  HEENT: No thrush Lungs: Diminished breath sounds Cardiac: Regular rate and rhythm Abdomen: No hepatosplenomegaly, right abdomen ileostomy with liquid stool, left lower quadrant colostomy, distended, left gluteal drain in place Extremities: No leg edema Neurologic: Alert and oriented, follows commands  Portacath/PICC-without erythema  Lab Results: Recent Labs    11/03/21 0412  WBC 8.2  HGB 8.3*  HCT 27.8*  PLT 399    BMET Recent Labs    11/03/21 0412 11/04/21 0901  NA 140 139  K 4.0 3.9  CL 99 98  CO2 31 34*  GLUCOSE 155* 123*  BUN 31* 33*  CREATININE 0.73 0.84  CALCIUM 9.2 9.3    Lab Results  Component Value Date   CEA1 1.94 07/28/2021   CEA 1.76 07/28/2021    Studies/Results: DG Naso G Tube Plc W/Fl W/Rad  Result Date: 11/03/2021 CLINICAL DATA:  Enteric tube placement. EXAM: NASO G TUBE PLACEMENT WITH FL AND WITH RAD CONTRAST:  None. FLUOROSCOPY TIME:  Fluoroscopy Time:  6 seconds Radiation Exposure Index (if provided by the fluoroscopic device): 1.7 mGy Number of Acquired Spot Images: 0 COMPARISON:  None. PROCEDURE/FINDINGS: This  exam was performed by Phillip Nyhan NP, and was supervised and interpreted by Phillip Backer MD. The nasogastric tube was lubricated with viscous lidocaine and inserted into the nostril. Under fluoroscopic guidance, the nasogastric tube was advanced into stomach, with the tip terminating in the gastric fundus. The tube was affixed to the patient's nose with tape. The patient tolerated the procedure well without immediate postprocedural complication. IMPRESSION: Successful fluoroscopic guided placement of nasogastric tube with tip in the gastric fundus. The tube is ready for immediate use. Electronically Signed   By: Phillip Dubin M.D.   On: 11/03/2021 15:15    Medications: I have reviewed the patient's current medications.  Assessment/Plan: Rectal cancer Colonoscopy 12/06/2020-partially obstructing mass beginning at 7 cm from the anal verge-biopsy adenocarcinoma CTs 12/10/2020-large circumferential fungating mass at the rectosigmoid junction beginning at 9 cm from the anal verge, enlarged perirectal lymph nodes, prominent bilateral iliac and pelvic sidewall nodes-new compared to a CT from 2018, no evidence of metastatic disease to the chest, pulmonary nodules at the right apex-nonspecific MRI pelvis 12/24/2020-T4b, N2; distance from tumor to the internal anal sphincter 7.3 cm; findings of potential contained perforation with tract seen potentially extending through the tumor into the mesorectum. Plan for total neoadjuvant therapy Cycle 1 FOLFOX 01/07/2021 Cycle 2 FOLFOX 01/22/2021 Cycle 3 FOLFOX 02/06/2021 Cycle 4 FOLFOX 02/19/2021 (oxaliplatin dose reduced to 65 mg per metered squared due to mild neutropenia) Cycle 5 FOLFOX 03/05/2021, Udenyca Cycle 6 FOLFOX 03/19/2021, Udenyca Cycle 7 FOLFOX 04/02/2021, Udenyca Cycle 8 FOLFOX 04/16/2021, Udenyca Radiation/Xeloda 05/05/2021-06/12/2021 MRI pelvis 06/26/2021-tumor at  9 cm from the anal verge, decreased tumor bulk, extra rectal involvement at the left seminal  vesicle and anterior peritoneal reflection, tumor involves the fascia at the anterior sacrum and mesorectum T4b.  Decrease in size of pelvic lymph nodes , High inferior mesenteric vein node not evaluated Low anterior resection and diverting ileostomy 07/31/2021, focally perforated at the left anterior mid rectum, no evidence of metastatic disease, moderately differentiated adenocarcinoma, 7 cm, tumor invades perirectal soft tissue, 1.5 mm radial margin, no lymphovascular perineural invasion, 0/15 lymph nodes, absent treatment effect, score 3, ypT3ypN0, no loss of mismatch repair protein expression, MSI stable, pathology upgraded to ypN1c based on mesenteric tumor deposits on small bowel resection 09/26/2021 Prolonged postoperative small bowel obstruction and ileum inability to tolerate p.o. 09/30/2021-lysis of adhesions, small bowel resection x2 with end colostomy, anastomotic dehiscence causing dense pelvic adhesions with small bowel obstruction, small bowel resection-benign enteric and colonic tissue with colitis/enteritis and dense fibrous adhesions, 2 mesenteric tumor deposits, 2 benign lymph nodes,ypN1c CT 10/11/2021-new low-density liver lesions concerning for metastases, dilated small bowel Lysis of adhesion, small bowel resection x2 with end colostomy 09/30/2021. 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  He underwent a low anterior resection and diverting ileostomy in October.  He has remained hospitalized with a prolonged ileus/bowel obstruction.  He was found to have dehiscence of the rectal anastomosis earlier this month and underwent lysis of adhesions and small bowel resections with creation of an end colostomy.  He was found to have mesenteric tumor deposits on the small bowel resection specimen.  He was found to have new low-density liver lesions on CT from 09/23/2021 with a more recent CT showing progression of these liver lesions.  CT report reviewed with the patient.  Mr. Bee appears to have metastatic rectal cancer.  I discussed the prognosis with him.  He is currently not a candidate for systemic treatment.  If bowel function returns, he can be considered for systemic treatment.  If no improvement in his bowel function, he will be a candidate for hospice.  He is currently being maintained on TPN for nutrition.  TPN cannot be continued if he enrolls with hospice.  He has increased abdominal pain this morning.  He is currently on a fentanyl patch at 50 mcg/h.  He has an order for Dilaudid 1 mg every 3 hours as needed for breakthrough pain.  I have increased the Dilaudid so that he may receive 1 to 2 mg every 3 hours as needed for pain.  We will continue the fentanyl patch at the current dose but consider increasing based on as needed pain medication usage.  Recommendations:  Continue postoperative care per the surgical service -surgery considering placement of PEG tube for drainage Currently not a candidate for chemotherapy.  If bowel function returns, will readdress.  If not, he will be a candidate for  hospice. Increase Dilaudid to 1 to 2 mg every 3 hours as needed for pain.  Continue fentanyl patch at 50 mcg/h.   Consider sublingual morphine for breakthrough pain.  We will follow recommendations of Dr. Hilma Favors.   LOS: 81 days   Mikey Bussing, NP   Mr. Conant was interviewed and examined.  I reviewed the 11/01/2021 CT images.  I discussed the CT findings with Mr. Candelas.  He has a persistent small bowel obstruction, likely secondary to carcinomatosis.  There has been significant progression in the liver on the current CT.  He has metastatic rectal cancer.  This is an incurable malignancy.  Mr. Leverette is not a candidate for systemic therapy with the persistent bowel obstruction.  This is a very difficult situation.  He will not be able to continue TPN if enrolled in hospice care.  I discussed the possibility of hospice with Mr. Anspach.  I feel the best treatment option will likely be placement of a venting gastrostomy with a hospice referral.  He can take liquids as tolerated when the gastrostomy tube is in place.  I will check on Mr. Hasegawa over the next few days.  I will be glad to discuss the case with his family.  I was present for greater than 50% of today's visit.  I performed medical decision making.  11/04/2021, 11:39 AM

## 2021-11-04 NOTE — Progress Notes (Signed)
34 Days Post-Op LOA, SBR and end colostomy creation  Subjective/Chief Complaint: NG replaced yesterday.     Vital signs in last 24 hours: Temp:  [98.4 F (36.9 C)-98.7 F (37.1 C)] 98.6 F (37 C) (01/17 0523) Pulse Rate:  [86-108] 86 (01/17 0523) Resp:  [18] 18 (01/17 0523) BP: (136-144)/(95-99) 136/95 (01/17 0523) SpO2:  [97 %-100 %] 100 % (01/17 0523) Last BM Date: 11/03/21  Intake/Output from previous day: 01/16 0701 - 01/17 0700 In: 2066.6 [P.O.:60; I.V.:2006.6] Out: 2630 [Urine:1900; Drains:30; Stool:700] Intake/Output this shift: No intake/output data recorded.  General appearance: alert, cooperative, and no distress GI: distended, appropriately tender in lower abdomen; ostomy pink with some liquid stool, colostomy viable Incisions OK, JP drain with less purulent drainage  Lab Results:  Recent Labs    11/03/21 0412  WBC 8.2  HGB 8.3*  HCT 27.8*  PLT 399    BMET Recent Labs    11/03/21 0412 11/04/21 0901  NA 140 139  K 4.0 3.9  CL 99 98  CO2 31 34*  GLUCOSE 155* 123*  BUN 31* 33*  CREATININE 0.73 0.84  CALCIUM 9.2 9.3    PT/INR No results for input(s): LABPROT, INR in the last 72 hours. ABG No results for input(s): PHART, HCO3 in the last 72 hours.  Invalid input(s): PCO2, PO2  Studies/Results: DG Naso G Tube Plc W/Fl W/Rad  Result Date: 11/03/2021 CLINICAL DATA:  Enteric tube placement. EXAM: NASO G TUBE PLACEMENT WITH FL AND WITH RAD CONTRAST:  None. FLUOROSCOPY TIME:  Fluoroscopy Time:  6 seconds Radiation Exposure Index (if provided by the fluoroscopic device): 1.7 mGy Number of Acquired Spot Images: 0 COMPARISON:  None. PROCEDURE/FINDINGS: This exam was performed by Rushie Nyhan NP, and was supervised and interpreted by Fabiola Backer MD. The nasogastric tube was lubricated with viscous lidocaine and inserted into the nostril. Under fluoroscopic guidance, the nasogastric tube was advanced into stomach, with the tip terminating in the  gastric fundus. The tube was affixed to the patient's nose with tape. The patient tolerated the procedure well without immediate postprocedural complication. IMPRESSION: Successful fluoroscopic guided placement of nasogastric tube with tip in the gastric fundus. The tube is ready for immediate use. Electronically Signed   By: Titus Dubin M.D.   On: 11/03/2021 15:15    Anti-infectives: Anti-infectives (From admission, onward)    Start     Dose/Rate Route Frequency Ordered Stop   10/22/21 1200  erythromycin 250 mg in sodium chloride 0.9 % 100 mL IVPB  Status:  Discontinued        250 mg 100 mL/hr over 60 Minutes Intravenous Every 6 hours 10/22/21 0841 10/31/21 0742   10/12/21 1000  piperacillin-tazobactam (ZOSYN) IVPB 3.375 g  Status:  Discontinued        3.375 g 12.5 mL/hr over 240 Minutes Intravenous Every 8 hours 10/12/21 0727 10/21/21 0855   09/27/2021 2200  cefoTEtan (CEFOTAN) 1 g in sodium chloride 0.9 % 100 mL IVPB  Status:  Discontinued        1 g 200 mL/hr over 30 Minutes Intravenous Every 12 hours 10/05/2021 1608 09/30/2021 1643   10/10/2021 2200  cefoTEtan (CEFOTAN) 2 g in sodium chloride 0.9 % 100 mL IVPB        2 g 200 mL/hr over 30 Minutes Intravenous  Once 10/02/2021 1644 09/30/2021 2212   10/15/2021 1158  sodium chloride 0.9 % with cefoTEtan (CEFOTAN) ADS Med       Note to Pharmacy: Lesia Hausen: cabinet  override      10/13/2021 1158 10/02/21 0014   09/17/21 1730  piperacillin-tazobactam (ZOSYN) IVPB 3.375 g        3.375 g 12.5 mL/hr over 240 Minutes Intravenous Every 8 hours 09/17/21 1631 09/22/21 1001   09/08/21 1400  cefTRIAXone (ROCEPHIN) 2 g in sodium chloride 0.9 % 100 mL IVPB       Note to Pharmacy: Pharmacy may adjust dosing strength / duration / interval for maximal efficacy   2 g 200 mL/hr over 30 Minutes Intravenous Every 24 hours 09/08/21 1307 09/14/21 1431   08/26/21 1000  metroNIDAZOLE (FLAGYL) IVPB 500 mg  Status:  Discontinued        500 mg 100 mL/hr over 60 Minutes  Intravenous Every 12 hours 08/26/21 0830 08/26/21 0910   08/26/21 1000  piperacillin-tazobactam (ZOSYN) IVPB 3.375 g  Status:  Discontinued        3.375 g 12.5 mL/hr over 240 Minutes Intravenous Every 8 hours 08/26/21 0910 09/01/21 0819   08/25/21 1030  metroNIDAZOLE (FLAGYL) tablet 500 mg  Status:  Discontinued        500 mg Per Tube Every 12 hours 08/25/21 0935 08/26/21 0830   08/25/21 1000  cefTRIAXone (ROCEPHIN) 2 g in sodium chloride 0.9 % 100 mL IVPB  Status:  Discontinued       Note to Pharmacy: Pharmacy may adjust dosing strength / duration / interval for maximal efficacy   2 g 200 mL/hr over 30 Minutes Intravenous Every 24 hours 08/25/21 0823 08/26/21 0910   08/25/21 1000  metroNIDAZOLE (FLAGYL) tablet 500 mg  Status:  Discontinued        500 mg Oral Every 12 hours 08/25/21 0837 08/25/21 0935   07/22/2021 2000  cefoTEtan (CEFOTAN) 2 g in sodium chloride 0.9 % 100 mL IVPB        2 g 200 mL/hr over 30 Minutes Intravenous Every 12 hours 07/22/2021 1647 07/20/2021 2046   07/23/2021 0600  cefoTEtan (CEFOTAN) 2 g in sodium chloride 0.9 % 100 mL IVPB        2 g 200 mL/hr over 30 Minutes Intravenous On call to O.R. 08/17/2021 4627 07/26/2021 0806       Assessment/Plan: s/p Procedure(s): LYSIS OF ADHESION (N/A) SMALL BOWEL RESECTION X 2 WITH END COLOSTOMY (N/A)  Severe malnutrition, prolonged ileus:  cont NG   Dehydration: cont MIV   pSBO: due to pelvic adhesions from pelvic abscess.  Pt unable to tolerate NG, IR not able to place, pt unwilling to undergo NG placement in fluoro, IR unable to place decompressive PEG tube.  Pt unable to tolerate a PO diet, taken to OR 12/14 for ex lap.  Several loops of small bowel adherent to completely dehisced anastomosis.  SBR x2 with endo colostomy, pathology c/w carcinomatosis.  NG removed 1/12 but pt became increasingly more distended.  Replaced in [place] /116  Anemia: most likely related to chronic illness and malnutrition.  Iron supplements ordered but pt  did not tolerate this.  Iron levels low.  IV iron given in early Dec, tachy overnight, will recheck and give blood as needed  Anastomotic dehiscence  - JP drainage catheter appears to be in appropriate position, Zosyn d/c'd 11/14.  wbc normal.  - Rpt CT 11/20 shows increased abscess size with drain in correct position.   restarted drain flushes.  No sign of systemic infection.  Discussed with IR on 11/21.  No further targets available for additional drainage.   7 day course of abx repeated.   -  CT 11/28 fluid collection more organized but slightly smaller, IR placed TG drain 11/30, Zosyn x5 days -12/8: GGF enema shows anastomotic leak, controlled with drain - 12/14 complete dehischence of anastomosis found on ex lap.  Colostomy created - 12/22 Abd JP removed CT 12/24 shows continued pelvic abscess and new RLQ fluid collection.  Will restart Zosyn and have IR eval for drainage tomorrow CT 12/29 shows improving fluid collections and continued ileus  ID: wbc elevated off abx.  Zosyn course completed and wbc normal now 1/5: IR drain advanced with good purulent output CT 1/14: shows continued bowel obstruction with TP in RLQ  Physical deconditioning: Ambulate in hall TID, appreciate PT/OT involvement (Pt refusing less interventions now)  Narcotic dependence: dilaudid prn for pain, will wean slowly, Palliative has seen and started Fentanyl and Lidoderm patch  Urinary retention: foley replaced on Mon 11/1.  foley out again 11/7, failed voiding trial 11/9 with elevated PVR.   Foley replaced, will need urology f/u as outpatient.   Pt appeared to have a neurogenic bladder during ex lap, foley out, straight cath q8h but pt develpoed increased Cr so foley replaced, Cr normalized  Pt with forming carcinomatous on recent exlap and new liver lesions concerning for mets on most recent CT.  Dr Benay Spice and pt aware.  Nothing further to do until recovered from GI standpoint, which may be months to  never  Dispo: pt has a temporary housing plan in place at discharge in [place] with his Aunt.  TOC team consulted and working with housing authority to assist with long term housing.    Discussed case with Dr Benay Spice yesterday.  There are no chemo options until GI function improves.  I don't foresee this improving anytime in the near future if ever, given very minimal progress over the past 3 months.  Discussed with him today the option of going to hospice now vs continuing with possible indefinite IV nutrition and gastric drainage via a peg tube.  Patient would like to think about this and discuss with his family.      LOS: 81 days    Rosario Adie 7/00/1749

## 2021-11-05 MED ORDER — HYDROMORPHONE HCL 1 MG/ML IJ SOLN
1.0000 mg | INTRAMUSCULAR | Status: DC | PRN
  Administered 2021-11-05 – 2021-11-06 (×6): 2 mg via INTRAVENOUS
  Filled 2021-11-05 (×6): qty 2

## 2021-11-05 MED ORDER — SODIUM CHLORIDE 0.9 % IV SOLN
25.0000 mg | Freq: Once | INTRAVENOUS | Status: AC
  Administered 2021-11-05: 25 mg via INTRAVENOUS
  Filled 2021-11-05: qty 1

## 2021-11-05 MED ORDER — TRAVASOL 10 % IV SOLN
INTRAVENOUS | Status: AC
  Filled 2021-11-05: qty 1101.6

## 2021-11-05 MED ORDER — FENTANYL 50 MCG/HR TD PT72: 2.0000 | MEDICATED_PATCH | TRANSDERMAL | Status: DC

## 2021-11-05 NOTE — Progress Notes (Signed)
Pt confused this pm, asking questions about MD Hilma Favors), states he "needs to verify her credentials." When asked what he and Dr Hilma Favors talked about, pt stated that she "talked too much" and that he will talk about it more tomorrow. Pt refused to allow me to put a DNR bracelet on him. I explained that this meant that, in an emergency we would not put him on the ventilator and would not send him to ICU, among other things. That we would focus on his comfort/pain meds instead of a cure. Pt drifted off to sleep during this discussion.

## 2021-11-05 NOTE — Progress Notes (Signed)
°   11/05/21 1200  Mobility  Activity Refused mobility   RN stated pt was not in good condition this morning. Holding mobility.  [facility] Office: [phone]

## 2021-11-05 NOTE — Progress Notes (Signed)
35 Days Post-Op LOA, SBR and end colostomy creation  Subjective/Chief Complaint: Pt only concerned about timing of his narcotics.       Vital signs in last 24 hours: Temp:  [98.3 F (36.8 C)-98.9 F (37.2 C)] 98.9 F (37.2 C) (01/18 0451) Pulse Rate:  [107-110] 110 (01/18 0451) Resp:  [16] 16 (01/18 0451) BP: (122-127)/(77-90) 127/90 (01/18 0451) SpO2:  [100 %] 100 % (01/18 0451) Weight:  [61.9 kg] 61.9 kg (01/18 0500) Last BM Date: 11/05/21  Intake/Output from previous day: 01/17 0701 - 01/18 0700 In: 877.4 [I.V.:877.4] Out: 3110 [Urine:2650; Drains:5; Stool:455] Intake/Output this shift: No intake/output data recorded.  General appearance: alert, cooperative, and no distress GI: distended, appropriately tender in lower abdomen; ostomy pink with some liquid stool, colostomy viable Incisions OK, JP drain with purulent drainage  Lab Results:  Recent Labs    11/03/21 0412  WBC 8.2  HGB 8.3*  HCT 27.8*  PLT 399    BMET Recent Labs    11/03/21 0412 11/04/21 0901  NA 140 139  K 4.0 3.9  CL 99 98  CO2 31 34*  GLUCOSE 155* 123*  BUN 31* 33*  CREATININE 0.73 0.84  CALCIUM 9.2 9.3    PT/INR No results for input(s): LABPROT, INR in the last 72 hours. ABG No results for input(s): PHART, HCO3 in the last 72 hours.  Invalid input(s): PCO2, PO2  Studies/Results: DG Naso G Tube Plc W/Fl W/Rad  Result Date: 11/03/2021 CLINICAL DATA:  Enteric tube placement. EXAM: NASO G TUBE PLACEMENT WITH FL AND WITH RAD CONTRAST:  None. FLUOROSCOPY TIME:  Fluoroscopy Time:  6 seconds Radiation Exposure Index (if provided by the fluoroscopic device): 1.7 mGy Number of Acquired Spot Images: 0 COMPARISON:  None. PROCEDURE/FINDINGS: This exam was performed by Rushie Nyhan NP, and was supervised and interpreted by Fabiola Backer MD. The nasogastric tube was lubricated with viscous lidocaine and inserted into the nostril. Under fluoroscopic guidance, the nasogastric tube was advanced  into stomach, with the tip terminating in the gastric fundus. The tube was affixed to the patient's nose with tape. The patient tolerated the procedure well without immediate postprocedural complication. IMPRESSION: Successful fluoroscopic guided placement of nasogastric tube with tip in the gastric fundus. The tube is ready for immediate use. Electronically Signed   By: Titus Dubin M.D.   On: 11/03/2021 15:15    Anti-infectives: Anti-infectives (From admission, onward)    Start     Dose/Rate Route Frequency Ordered Stop   10/22/21 1200  erythromycin 250 mg in sodium chloride 0.9 % 100 mL IVPB  Status:  Discontinued        250 mg 100 mL/hr over 60 Minutes Intravenous Every 6 hours 10/22/21 0841 10/31/21 0742   10/12/21 1000  piperacillin-tazobactam (ZOSYN) IVPB 3.375 g  Status:  Discontinued        3.375 g 12.5 mL/hr over 240 Minutes Intravenous Every 8 hours 10/12/21 0727 10/21/21 0855   09/18/2021 2200  cefoTEtan (CEFOTAN) 1 g in sodium chloride 0.9 % 100 mL IVPB  Status:  Discontinued        1 g 200 mL/hr over 30 Minutes Intravenous Every 12 hours 10/13/2021 1608 09/21/2021 1643   10/07/2021 2200  cefoTEtan (CEFOTAN) 2 g in sodium chloride 0.9 % 100 mL IVPB        2 g 200 mL/hr over 30 Minutes Intravenous  Once 09/30/2021 1644 09/30/2021 2212   09/26/2021 1158  sodium chloride 0.9 % with cefoTEtan (CEFOTAN) ADS Med  Note to Pharmacy: Lesia Hausen: cabinet override      09/25/2021 1158 10/02/21 0014   09/17/21 1730  piperacillin-tazobactam (ZOSYN) IVPB 3.375 g        3.375 g 12.5 mL/hr over 240 Minutes Intravenous Every 8 hours 09/17/21 1631 09/22/21 1001   09/08/21 1400  cefTRIAXone (ROCEPHIN) 2 g in sodium chloride 0.9 % 100 mL IVPB       Note to Pharmacy: Pharmacy may adjust dosing strength / duration / interval for maximal efficacy   2 g 200 mL/hr over 30 Minutes Intravenous Every 24 hours 09/08/21 1307 09/14/21 1431   08/26/21 1000  metroNIDAZOLE (FLAGYL) IVPB 500 mg  Status:   Discontinued        500 mg 100 mL/hr over 60 Minutes Intravenous Every 12 hours 08/26/21 0830 08/26/21 0910   08/26/21 1000  piperacillin-tazobactam (ZOSYN) IVPB 3.375 g  Status:  Discontinued        3.375 g 12.5 mL/hr over 240 Minutes Intravenous Every 8 hours 08/26/21 0910 09/01/21 0819   08/25/21 1030  metroNIDAZOLE (FLAGYL) tablet 500 mg  Status:  Discontinued        500 mg Per Tube Every 12 hours 08/25/21 0935 08/26/21 0830   08/25/21 1000  cefTRIAXone (ROCEPHIN) 2 g in sodium chloride 0.9 % 100 mL IVPB  Status:  Discontinued       Note to Pharmacy: Pharmacy may adjust dosing strength / duration / interval for maximal efficacy   2 g 200 mL/hr over 30 Minutes Intravenous Every 24 hours 08/25/21 0823 08/26/21 0910   08/25/21 1000  metroNIDAZOLE (FLAGYL) tablet 500 mg  Status:  Discontinued        500 mg Oral Every 12 hours 08/25/21 0837 08/25/21 0935   08/12/2021 2000  cefoTEtan (CEFOTAN) 2 g in sodium chloride 0.9 % 100 mL IVPB        2 g 200 mL/hr over 30 Minutes Intravenous Every 12 hours 08/07/2021 1647 07/29/2021 2046   07/22/2021 0600  cefoTEtan (CEFOTAN) 2 g in sodium chloride 0.9 % 100 mL IVPB        2 g 200 mL/hr over 30 Minutes Intravenous On call to O.R. 07/21/2021 1157 08/06/2021 0806       Assessment/Plan: s/p Procedure(s): LYSIS OF ADHESION (N/A) SMALL BOWEL RESECTION X 2 WITH END COLOSTOMY (N/A)  Severe malnutrition, prolonged ileus:  cont NG   Dehydration: cont MIV   pSBO: due to pelvic adhesions from pelvic abscess.  Pt unable to tolerate NG, IR not able to place, pt unwilling to undergo NG placement in fluoro, IR unable to place decompressive PEG tube.  Pt unable to tolerate a PO diet, taken to OR 12/14 for ex lap.  Several loops of small bowel adherent to completely dehisced anastomosis.  SBR x2 with endo colostomy, pathology c/w carcinomatosis.  NG removed 1/12 but pt became increasingly more distended.  Replaced in [place] /116  Anemia: most likely related to chronic  illness and malnutrition.  Iron supplements ordered but pt did not tolerate this.  Iron levels low.  IV iron given in early Dec, tachy overnight, will recheck and give blood as needed  Anastomotic dehiscence  - JP drainage catheter appears to be in appropriate position, Zosyn d/c'd 11/14.  wbc normal.  - Rpt CT 11/20 shows increased abscess size with drain in correct position.   restarted drain flushes.  No sign of systemic infection.  Discussed with IR on 11/21.  No further targets available for additional drainage.  7 day course of abx repeated.   - CT 11/28 fluid collection more organized but slightly smaller, IR placed TG drain 11/30, Zosyn x5 days -12/8: GGF enema shows anastomotic leak, controlled with drain - 12/14 complete dehischence of anastomosis found on ex lap.  Colostomy created - 12/22 Abd JP removed CT 12/24 shows continued pelvic abscess and new RLQ fluid collection.  Will restart Zosyn and have IR eval for drainage tomorrow CT 12/29 shows improving fluid collections and continued ileus  ID: wbc elevated off abx.  Zosyn course completed and wbc normal now 1/5: IR drain advanced with good purulent output CT 1/14: shows continued bowel obstruction with TP in RLQ  Physical deconditioning: Ambulate in hall TID, appreciate PT/OT involvement (Pt refusing less interventions now)  Narcotic dependence: dilaudid prn for pain, will wean slowly, Palliative has seen and started Fentanyl and Lidoderm patch  Urinary retention: foley replaced on Mon 11/1.  foley out again 11/7, failed voiding trial 11/9 with elevated PVR.   Foley replaced, will need urology f/u as outpatient.   Pt appeared to have a neurogenic bladder during ex lap, foley out, straight cath q8h but pt develpoed increased Cr so foley replaced, Cr normalized  Pt with forming carcinomatous on recent exlap and new liver lesions concerning for mets on most recent CT.  Dr Benay Spice and pt aware.  Nothing further to do until  recovered from GI standpoint, which may be months to never  Dispo: pt has a temporary housing plan in place at discharge in [place] with his Aunt.  TOC team consulted and working with housing authority to assist with long term housing.    There are no chemo options until GI function improves.  I don't foresee this improving anytime in the near future, if ever, given very minimal progress over the past 3 months.  Discussed with him the option of going to hospice now vs continuing with possible indefinite IV nutrition and gastric drainage via a peg tube.  Patient would like to think about this and discuss with his family.      LOS: 82 days    Rosario Adie 7/61/6073

## 2021-11-05 NOTE — Progress Notes (Signed)
PHARMACY - TOTAL PARENTERAL NUTRITION CONSULT NOTE   Indication: intolerance to enteral feeding  Patient Measurements: Height: 5\' 6"  (167.6 cm) Weight: 61.9 kg (136 lb 7.4 oz) IBW/kg (Calculated) : 63.8 TPN AdjBW (KG): 60.1 Body mass index is 22.03 kg/m.  Assessment:  66 yo M presents on 10/28 for eval of rectal cancer. S/P LAR and diverting ileostomy. Pharmacy consulted to start TPN 11/5. Stopped TPN on 11/15 but had to restart on 11/17 as patient vomited as is unable to tolerate PO.  1/5: clamped NG tube for 8 hours - per RN patient tolerated  1/7: Emesis overnight >500 mL. NG hooked back up to suction 1/9: NG clamped with one episode of emesis per surgery note 1/11: significant emesis then NG to suction with significant amount out 1/12: NG removed with vomiting overnight 1/16 NG tube replaced by fluoroscopy.  Glucose / Insulin: No Hx DM.  - CBGs/SSI d/c w/ CBGs consistently at goal without insulin Electrolytes:  1/17: Lytes WNL.  CO2 elevated Renal: 1/17 SCr WNL, BUN  33 - slightly elevated   Hepatic: 1/16: Albumin remains low, improving ; LFTs WNL but slightly up and Tbili WNL Prealbumin: 17.4 (1/9) TG: 12/26: WNL. 1/2: 182 slightly elevated 1/16: WNL Intake / Output: net I/O -2.2 L -Output:  Urine 2650 mL, drain 5 mL, stool 455 mL IVF: None GI Imaging: 11/4 CT abdomen shows fluid collection, SBO, bladder thickening  11/17 KUB: Dilated small bowel loops are noted concerning for distal SBO 11/19 CT:  SBO, increase in size of gas and fluid abscess formation  11/28 CTAP: mild decrease in gas/gjuid in presacral space, no change in SBO 12/5 CTAP:  severe dilatation of small bowel loops in the abdomen concerning for ileus/obstruction 12/7 GGF enema: shows anastomotic leak, controlled with drain 12/24 CT: continued pelvic abscess and new RLQ fluid collection 12/29 CT: improved fluid collections and continued ileus 1/2 [place]: NG tube tip looped within gastric fundus, suggestive of  partial SBO similar to that noted on CT 12/29 1/14 CT:  shows continued bowel obstruction  GI Surgeries / Procedures:  10/28: LAR/Diverting ileostomy  11/30: Successful CT-guided presacral pelvic abscess drain placement 12/14: Small bowel resection x 2 with end colostomy, LOA; NGT placed for decompression  1/5: IR exchanged/repositioned transgluteal presacral drainage catheter  Central access: Implanted Port TPN start date: 11/5 >> 11/15. Resumed 11/17>>  Nutritional Goals:  Cyclic TPN:  4081 mL provides 110 g of protein and 1984 kcals per day  RD Assessment: Estimated Needs Total Energy Estimated Needs: 1950-2150 Total Protein Estimated Needs: 100-110g Total Fluid Estimated Needs: 2L/day  Current Nutrition:  1/9: CLD  TPN - 12 hour cyclic (44/81)  Plan:   At 8563: Continue cyclic TPN:  Infuse 1,497 mL over 12 hrs: 93 mL/hr x 1 hr, then 185 mL/hr x 10 hrs, then 93 mL/hr x 1 hr. Electrolytes in TPN. No change Na 60 mEq/L  K 60 mEq/L Ca 3 mEq/L Mg 5 mEq/L Phos 15 mmol/L Cl:Ac 1:1 Add standard MVI and trace elements to TPN TPN labs on Mon/Thurs   Gretta Arab PharmD, BCPS Clinical Pharmacist [facility] main pharmacy [phone] 11/05/2021 7:45 AM

## 2021-11-05 NOTE — Progress Notes (Addendum)
Pt very upset this am about his IV pain meds, asking this nurse to "help me out . . " Wanting me to give his med early. Once med administered, pt then requests that I flush his IV fast w NS so he can get the med faster. Pt then asks that I get his med dose increased bc "it doesn't work like it used to." Pt threatening to call 911, confused at times. MD made aware, will cont to monitor.

## 2021-11-05 NOTE — Progress Notes (Signed)
Pt's sister and cousin in early afternoon to visit w pt. Sister voicing concerns about pt's vomiting and lack of pain meds/pain control. Explained that pt has not asked for nausea meds (pt stated was nauseated, but denied emesis). Also explained that MD had just spoken w pt at length about IV pain meds and scheduling. PRN for nausea given, then when time for pain med, this given as well. Sister states that they want pt moved to [facility] so he "can get better pain relief" and better care. Message passed along to scrub RN working in the [place] w Dr Marcello Moores. Family unable to stay > 30 min, stated that they will all be back on Friday afternoon and would like an MD/pt/family meeting. Will pass this message along to [facility] and MD.

## 2021-11-06 LAB — COMPREHENSIVE METABOLIC PANEL
ALT: 50 U/L — ABNORMAL HIGH (ref 0–44)
AST: 29 U/L (ref 15–41)
Albumin: 2.6 g/dL — ABNORMAL LOW (ref 3.5–5.0)
Alkaline Phosphatase: 80 U/L (ref 38–126)
Anion gap: 9 (ref 5–15)
BUN: 37 mg/dL — ABNORMAL HIGH (ref 8–23)
CO2: 30 mmol/L (ref 22–32)
Calcium: 9.1 mg/dL (ref 8.9–10.3)
Chloride: 101 mmol/L (ref 98–111)
Creatinine, Ser: 0.64 mg/dL (ref 0.61–1.24)
GFR, Estimated: >60 mL/min (ref >60)
Glucose, Bld: 188 mg/dL — ABNORMAL HIGH (ref 70–99)
Potassium: 4.1 mmol/L (ref 3.5–5.1)
Sodium: 140 mmol/L (ref 135–145)
Total Bilirubin: 0.3 mg/dL (ref 0.3–1.2)
Total Protein: 7.5 g/dL (ref 6.5–8.1)

## 2021-11-06 LAB — MAGNESIUM: Magnesium: 2.4 mg/dL (ref 1.7–2.4)

## 2021-11-06 LAB — PHOSPHORUS: Phosphorus: 3.4 mg/dL (ref 2.5–4.6)

## 2021-11-06 MED ORDER — FENTANYL BOLUS VIA INFUSION
100.0000 ug | INTRAVENOUS | Status: DC | PRN
  Administered 2021-11-06: 100 ug via INTRAVENOUS
  Filled 2021-11-06: qty 100

## 2021-11-06 MED ORDER — HALOPERIDOL LACTATE 5 MG/ML IJ SOLN
5.0000 mg | Freq: Once | INTRAMUSCULAR | Status: AC
  Administered 2021-11-06: 5 mg via INTRAVENOUS
  Filled 2021-11-06: qty 1

## 2021-11-06 MED ORDER — PANTOPRAZOLE SODIUM 40 MG IV SOLR
40.0000 mg | INTRAVENOUS | Status: DC
  Administered 2021-11-06: 40 mg via INTRAVENOUS
  Filled 2021-11-06: qty 40

## 2021-11-06 MED ORDER — SODIUM CHLORIDE 0.9 % IV SOLN
1.0000 mg/h | INTRAVENOUS | Status: DC
  Administered 2021-11-06: 0.5 mg/h via INTRAVENOUS
  Filled 2021-11-06: qty 5

## 2021-11-06 MED ORDER — TRAVASOL 10 % IV SOLN
INTRAVENOUS | Status: AC
  Filled 2021-11-06: qty 1101.6

## 2021-11-06 MED ORDER — DIAZEPAM 5 MG/ML IJ SOLN
5.0000 mg | INTRAMUSCULAR | Status: DC | PRN
  Administered 2021-11-06: 5 mg via INTRAVENOUS
  Filled 2021-11-06: qty 2

## 2021-11-06 MED ORDER — HYDROMORPHONE BOLUS VIA INFUSION
1.0000 mg | INTRAVENOUS | Status: DC | PRN
Start: 2021-11-06 — End: 2021-11-06
  Filled 2021-11-06: qty 1

## 2021-11-06 MED ORDER — CLONIDINE HCL 0.1 MG/24HR TD PTWK
0.1000 mg | MEDICATED_PATCH | TRANSDERMAL | Status: DC
  Administered 2021-11-06: 0.1 mg via TRANSDERMAL
  Filled 2021-11-06: qty 1

## 2021-11-06 MED ORDER — MIRTAZAPINE 15 MG PO TBDP
15.0000 mg | ORAL_TABLET | Freq: Every day | ORAL | Status: DC
  Administered 2021-11-07: 15 mg via ORAL
  Filled 2021-11-06 (×2): qty 1

## 2021-11-06 MED ORDER — SODIUM CHLORIDE 0.9 % IV SOLN
8.0000 mg | Freq: Four times a day (QID) | INTRAVENOUS | Status: DC
  Administered 2021-11-06 – 2021-11-07 (×3): 8 mg via INTRAVENOUS
  Filled 2021-11-06 (×7): qty 4

## 2021-11-06 MED ORDER — DIAZEPAM 5 MG/ML IJ SOLN: 5.0000 mg | INTRAMUSCULAR | Status: DC | PRN

## 2021-11-06 MED ORDER — SCOPOLAMINE 1 MG/3DAYS TD PT72
1.0000 | MEDICATED_PATCH | TRANSDERMAL | Status: DC
  Administered 2021-11-06: 1.5 mg via TRANSDERMAL
  Filled 2021-11-06: qty 1

## 2021-11-06 MED ORDER — FENTANYL CITRATE PF 50 MCG/ML IJ SOSY
50.0000 ug | PREFILLED_SYRINGE | Freq: Once | INTRAMUSCULAR | Status: AC
  Administered 2021-11-06: 50 ug via INTRAVENOUS
  Filled 2021-11-06: qty 1

## 2021-11-06 MED ORDER — DIAZEPAM 5 MG/ML IJ SOLN
2.5000 mg | INTRAMUSCULAR | Status: DC | PRN
  Administered 2021-11-06: 2.5 mg via INTRAVENOUS
  Filled 2021-11-06: qty 2

## 2021-11-06 MED ORDER — DIAZEPAM 5 MG/ML IJ SOLN
10.0000 mg | INTRAMUSCULAR | Status: DC | PRN
  Administered 2021-11-07 (×2): 10 mg via INTRAVENOUS
  Filled 2021-11-06 (×2): qty 2

## 2021-11-06 MED ORDER — FENTANYL 2500MCG IN NS 250ML (10MCG/ML) PREMIX INFUSION
180.0000 ug/h | INTRAVENOUS | Status: DC
  Administered 2021-11-06: 180 ug/h via INTRAVENOUS
  Filled 2021-11-06: qty 250

## 2021-11-06 NOTE — Progress Notes (Signed)
°   11/06/21 0600  Assess: MEWS Score  Temp 98.4 F (36.9 C)  BP 124/81  Pulse Rate (!) 124  Resp 16  O2 Device Room Air  Assess: MEWS Score  MEWS Temp 0  MEWS Systolic 0  MEWS Pulse 2  MEWS RR 0  MEWS LOC 0  MEWS Score 2  MEWS Score Color Yellow  Assess: if the MEWS score is Yellow or Red  Were vital signs taken at a resting state? Yes  Focused Assessment Change from prior assessment (see assessment flowsheet)  Does the patient meet 2 or more of the SIRS criteria? No  Does the patient have a confirmed or suspected source of infection? Yes  Provider and Rapid Response Notified? No  MEWS guidelines implemented *See Row Information* Yes  Take Vital Signs  Increase Vital Sign Frequency  Yellow: Q 2hr X 2 then Q 4hr X 2, if remains yellow, continue Q 4hrs  Escalate  MEWS: Escalate Yellow: discuss with charge nurse/RN and consider discussing with provider and RRT  Notify: Charge Nurse/RN  Name of Charge Nurse/RN Notified Richelle RN  Date Charge Nurse/RN Notified 11/06/21  Time Charge Nurse/RN Notified 0600  Notify: Provider  Provider Name/Title will continue to monitor  Date Provider Notified 11/06/21  Document  Patient Outcome Other (Comment) (continue to monitor)  Progress note created (see row info) Yes  Assess: SIRS CRITERIA  SIRS Temperature  0  SIRS Pulse 1  SIRS Respirations  0  SIRS WBC 0  SIRS Score Sum  1

## 2021-11-06 NOTE — Progress Notes (Signed)
Chaplain engaged in an initial visit with Phillip Brooks.  Phillip Brooks voiced that he is lonely and wants someone by his side.  He also was able to relay his fear of going to sleep.  Chaplain and others can assess Phillip Brooks has some high anxiety around going to sleep attached to his fear of dying.  Phillip Brooks is very restless even after receiving pain medication.  He continued to voice that the pain medication was not enough.  Chaplain worked to provide him so comfort as she sat with him and helped him with his needs in getting ice, repositioning items in the bed, charging his phone, and getting wash cloths.    Chaplain and Phillip Brooks also enjoyed watching a movie together about [employer] bands.  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  He is also apart of the [employer].  Phillip Brooks shared that his family is both from [place], [place] and [place].  [name]' aunt is currently working to get him to [place] because she is his nearest relative but understand that there may be some barriers to this.  She wants to consult with Phillip Brooks' mother by phone when aunt arrives to see him tomorrow.    Chaplain provided listening, presence, and support.  Chaplain is going to input a request for a [employer] which may help Phillip Brooks with feeling lonely. Chaplain is available to follow-up as needed.    11/06/21 1400  Clinical Encounter Type  Visited With Patient  Visit Type Initial  Referral From Palliative care team  Consult/Referral To Chaplain  Stress Factors  Patient Stress Factors Loss of control;Health changes;Major life changes

## 2021-11-06 NOTE — Progress Notes (Signed)
PHARMACY - TOTAL PARENTERAL NUTRITION CONSULT NOTE   Indication: intolerance to enteral feeding  Patient Measurements: Height: 5\' 6"  (167.6 cm) Weight: 61.1 kg (134 lb 11.2 oz) IBW/kg (Calculated) : 63.8 TPN AdjBW (KG): 60.1 Body mass index is 21.74 kg/m.  Assessment:  66 yo M presents on 10/28 for eval of rectal cancer. S/P LAR and diverting ileostomy. Pharmacy consulted to start TPN 11/5. Stopped TPN on 11/15 but had to restart on 11/17 as patient vomited as is unable to tolerate PO.  1/5: clamped NG tube for 8 hours - per RN patient tolerated  1/7: Emesis overnight >500 mL. NG hooked back up to suction 1/9: NG clamped with one episode of emesis per surgery note 1/11: significant emesis then NG to suction with significant amount out 1/12: NG removed with vomiting overnight 1/16 NG tube replaced by fluoroscopy.  Glucose / Insulin: No Hx DM.  - CBGs/SSI d/c w/ CBGs consistently at goal without insulin Electrolytes:  1/19: Lytes WNL.  Mag up to 2.4, CorrCa 10.22 Renal: SCr WNL, BUN up to 37  Hepatic: Albumin remains low; ALT increased to 50, Tbili WNL.  Prealbumin: 17.4 (1/9) TG: 1/16: WNL Intake / Output: net I/O -2.2 L -Output:  Urine 2650 mL, drain 5 mL, stool 455 mL IVF: None GI Imaging: 11/4 CT abdomen shows fluid collection, SBO, bladder thickening  11/17 KUB: Dilated small bowel loops are noted concerning for distal SBO 11/19 CT:  SBO, increase in size of gas and fluid abscess formation  11/28 CTAP: mild decrease in gas/gjuid in presacral space, no change in SBO 12/5 CTAP:  severe dilatation of small bowel loops in the abdomen concerning for ileus/obstruction 12/7 GGF enema: shows anastomotic leak, controlled with drain 12/24 CT: continued pelvic abscess and new RLQ fluid collection 12/29 CT: improved fluid collections and continued ileus 1/2 [place]: NG tube tip looped within gastric fundus, suggestive of partial SBO similar to that noted on CT 12/29 1/14 CT:  shows  continued bowel obstruction  GI Surgeries / Procedures:  10/28: LAR/Diverting ileostomy  11/30: Successful CT-guided presacral pelvic abscess drain placement 12/14: Small bowel resection x 2 with end colostomy, LOA; NGT placed for decompression  1/5: IR exchanged/repositioned transgluteal presacral drainage catheter  Central access: Implanted Port TPN start date: 11/5 >> 11/15. Resumed 11/17>>  Nutritional Goals:  Cyclic TPN:  1856 mL provides 110 g of protein and 1984 kcals per day  RD Assessment: Estimated Needs Total Energy Estimated Needs: 1950-2150 Total Protein Estimated Needs: 100-110g Total Fluid Estimated Needs: 2L/day  Current Nutrition:  1/9: CLD  TPN - 12 hour cyclic (31/49)  Plan:   At 7026: Continue cyclic TPN:  Infuse 3,785 mL over 12 hrs: 93 mL/hr x 1 hr, then 185 mL/hr x 10 hrs, then 93 mL/hr x 1 hr. Electrolytes in TPN. No change Na 60 mEq/L  K 60 mEq/L Ca 3 mEq/L Mg 3 mEq/L Phos 15 mmol/L Cl:Ac 1:1 Add standard MVI and trace elements to TPN TPN labs on Mon/Thurs   Gretta Arab PharmD, BCPS Clinical Pharmacist [facility] main pharmacy [phone] 11/06/2021 8:39 AM

## 2021-11-06 NOTE — Progress Notes (Addendum)
Palliative Care Progress Note  I met with Phillip Brooks this evening to discuss his goals of care and to address his concerns about his pain and symptom management. On my visit he is extremely lethargic, unable to carry on a full conversation. He appears to be uncomfortable but says it is not pain-he is having nausea and hiccupping persistently.  In terms of his pain management he is using 21m of IV  hydromorphone in 24 hours in addition to his 588m fentanyl patch. He has existential suffering-he is scared and anxious and known that the care plan that he hoped for is not working out -he knows that his cancer is progressing and that he is facing EOL. I was able to clarify his code status in the event of cardiac or respiratory arrest he would not want CPR or Intubation, and he would not survive a resuscitation effort given his advanced cancer and terminally ill status.   I asked him if I could update his family and he did not want me to call them - he told me he would call them in the AM. I have recommended a family meeting or conference call to discuss his care.  Recommendations:  DNR Increase Fentanyl to 1005mTD Hydromorphone q2 PRN Gave him a one time dose of Thorazine tonight for his hiccups and nausea. Plans noted for surgical placement of decompression PEG. Challenge here will be discontinuation of TPN as his primary nutrition source- if he has a decompression PEG he can resume a soft diet and vent his PEG tube or hook to gravity to prevent vomiting or symptoms of stomach distention. Hospice will not cover the cost of TPN administration in most cases. His prognosis is extremely poor and he has declined significantly over the past 3 months in the hospital. Need to schedule Family Meeting- he has an AunArchitectural technologistd two sisters who have been involved -I am not sure how much they understand about his current condition and prognosis-[place] [facility]  Time: 45 minutes

## 2021-11-06 NOTE — Progress Notes (Signed)
5 mg of Valium IV wasted in [place] and witnessed by Hetty Ely, RN

## 2021-11-06 NOTE — Progress Notes (Addendum)
IP PROGRESS NOTE  Subjective:   [name] continues to have some intermittent pain this morning.  Pain medications are being adjusted by the palliative care team.  General surgery planning to proceed with palliative PEG tube.  Objective: Vital signs in last 24 hours: Blood pressure 127/88, pulse (!) 122, temperature 98.5 F (36.9 C), temperature source Oral, resp. rate 18, height _0  (1.676 m), weight 61.1 kg, SpO2 98 %.  Intake/Output from previous day: 01/18 0701 - 01/19 0700 In: 2810.6 [P.O.:660; I.V.:2150.6] Out: 2530 [Urine:2400; Drains:30; Stool:100]  Physical Exam:  HEENT: No thrush Lungs: Diminished breath sounds Cardiac: Regular rate and rhythm Abdomen: No hepatosplenomegaly, right abdomen ileostomy with liquid stool, left lower quadrant colostomy, distended, left gluteal drain in place Extremities: No leg edema Neurologic: Alert, follows commands  Portacath/PICC-without erythema  Lab Results: No results for input(s): WBC, HGB, HCT, PLT in the last 72 hours.   BMET Recent Labs    11/04/21 0901 11/06/21 0403  NA 139 140  K 3.9 4.1  CL 98 101  CO2 34* 30  GLUCOSE 123* 188*  BUN 33* 37*  CREATININE 0.84 0.64  CALCIUM 9.3 9.1    Lab Results  Component Value Date   CEA1 1.94 07/28/2021   CEA 1.76 07/28/2021    Studies/Results: No results found.  Medications: I have reviewed the patient's current medications.  Assessment/Plan: Rectal cancer Colonoscopy 12/06/2020-partially obstructing mass beginning at 7 cm from the anal verge-biopsy adenocarcinoma CTs 12/10/2020-large circumferential fungating mass at the rectosigmoid junction beginning at 9 cm from the anal verge, enlarged perirectal lymph nodes, prominent bilateral iliac and pelvic sidewall nodes-new compared to a CT from 2018, no evidence of metastatic disease to the chest, pulmonary nodules at the right apex-nonspecific MRI pelvis 12/24/2020-T4b, N2; distance from tumor to the internal anal sphincter  7.3 cm; findings of potential contained perforation with tract seen potentially extending through the tumor into the mesorectum. Plan for total neoadjuvant therapy Cycle 1 FOLFOX 01/07/2021 Cycle 2 FOLFOX 01/22/2021 Cycle 3 FOLFOX 02/06/2021 Cycle 4 FOLFOX 02/19/2021 (oxaliplatin dose reduced to 65 mg per metered squared due to mild neutropenia) Cycle 5 FOLFOX 03/05/2021, Udenyca Cycle 6 FOLFOX 03/19/2021, Udenyca Cycle 7 FOLFOX 04/02/2021, Udenyca Cycle 8 FOLFOX 04/16/2021, Udenyca Radiation/Xeloda 05/05/2021-06/12/2021 MRI pelvis 06/26/2021-tumor at 9 cm from the anal verge, decreased tumor bulk, extra rectal involvement at the left seminal vesicle and anterior peritoneal reflection, tumor involves the fascia at the anterior sacrum and mesorectum T4b.  Decrease in size of pelvic lymph nodes , High inferior mesenteric vein node not evaluated Low anterior resection and diverting ileostomy 07/27/2021, focally perforated at the left anterior mid rectum, no evidence of metastatic disease, moderately differentiated adenocarcinoma, 7 cm, tumor invades perirectal soft tissue, 1.5 mm radial margin, no lymphovascular perineural invasion, 0/15 lymph nodes, absent treatment effect, score 3, ypT3ypN0, no loss of mismatch repair protein expression, MSI stable, pathology upgraded to ypN1c based on mesenteric tumor deposits on small bowel resection 10/03/2021 Prolonged postoperative small bowel obstruction and ileum inability to tolerate p.o. 09/25/2021-lysis of adhesions, small bowel resection x2 with end colostomy, anastomotic dehiscence causing dense pelvic adhesions with small bowel obstruction, small bowel resection-benign enteric and colonic tissue with colitis/enteritis and dense fibrous adhesions, 2 mesenteric tumor deposits, 2 benign lymph nodes,ypN1c CT 10/11/2021-new low-density liver lesions concerning for metastases, dilated small bowel Lysis of adhesion, small bowel resection x2 with end colostomy 09/20/2021. CT  11/01/2021-high-grade small bowel obstruction with transition within the distal small bowel, progressive hepatic metastases with increased  size of the liver lesion since prior study  Pain secondary #1-improved Weight loss-improved Port-A-Cath placement, Dr. 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 appears unchanged.  He has had a prolonged hospitalization due to prolonged ileus/bowel obstruction following surgery in October.  He was found to have new low-density liver lesions on CT with progression of these lesions on the most recent CT.  We discussed with the patient that he likely has metastatic rectal cancer.  Prognosis has been discussed with him and also with family by telephone.  He is not a candidate for systemic treatment.  We recommend hospice.  Recommendation for hospice was discussed with the family member this morning who is considering taking him to [place] to live with her.  It is unlikely that hospice will cover TPN.  Palliative care is following and assisting with pain and symptom management.  Surgery planning to place decompressive PEG tube.   Recommendations:  Continue postoperative care per the surgical service -surgery considering placement of PEG tube for drainage He is not a candidate for chemotherapy.  We recommend hospice.  Discharge planning per TOC. Management of pain and medications for anxiety per palliative care.  Please call medical oncology as needed.   LOS: 83 days    Mikey Bussing, NP  I discussed the poor prognosis and treatment options with [name] again today.  I recommend comfort care/hospice.  I discussed the case with his aunt, Phillip Brooks by telephone this morning.  She lives in [place] and would like for Mr Phillip Brooks to be discharged to [facility].  She would try to care for him in the home with hospice versus placement in [place]  He continues to request frequent pain medication.  He may benefit from a narcotic infusion.  Dr. Hilma Favors will manage the pain and comfort medications.  I will check on him 11/10/2021 if he remains in the hospital.  I was present for greater than 50% of today's visit.  I performed medical decision making.

## 2021-11-06 NOTE — Progress Notes (Signed)
IVT present to hang cyclic TNA.  Upon arrival, Primary RN, Helene Kelp advised of placing/placed a new consult for a PIV.  IVT RN advised of potential wait time to assess due to prior consults with high importance.  Per conversation, primary RN had not assessed pt for PIV access, but would have another unit RN to assess.  Advised if unable to gain access, place new consult for IVT.

## 2021-11-06 NOTE — Progress Notes (Signed)
7.5 mg IV Valium discarded in the [place] and witnessed by Hetty Ely, Therapist, sports

## 2021-11-06 NOTE — TOC Progression Note (Signed)
Transition of Care [facility]) - Progression Note   Patient Details  Name: Phillip Brooks MRN: [id number] Date of Birth: 02-04-1956  Transition of Care [facility]) CM/SW [place], LCSW Phone Number: 11/06/2021, 2:00 PM  Clinical Narrative: CSW contacted [facility] to find out the out-of-pocket cost for the patient to be transported via ambulance to [facility] (aunt's address), which will be $2708.94. Patient has given permission to treatment team to speak with his aunt, Fallon Howerter. CSW spoke with aunt regarding the cost of transport to [place]. Aunt is unsure if the patient or family can afford to pay for transportation. Aunt asked about alternate transportation options. CSW explained that the patient likely in too much pain to be transported any way other than by stretcher if he is going to [place]. Aunt reported she wanted the patient in [place] as "there are no relatives there." TOC awaiting update from palliative team.  Expected Discharge Plan: [place] Barriers to Discharge: Continued Medical Work up  Expected Discharge Plan and Services Expected Discharge Plan: [place] In-house Referral: Clinical Social Work Living arrangements for the past 2 months: Single Family Home           DME Arranged: N/A DME Agency: NA  Readmission Risk Interventions Readmission Risk Prevention Plan 10/10/2021  Transportation Screening Complete  HRI or Home Care Consult Complete  Social Work Consult for [place] Planning/Counseling Complete  Palliative Care Screening Not Applicable  Medication Review Press photographer) Complete  Some recent data might be hidden

## 2021-11-06 NOTE — Progress Notes (Signed)
36 Days Post-Op LOA, SBR and end colostomy creation  Subjective/Chief Complaint: Pt would like to proceed with decompressive PEG tube    Vital signs in last 24 hours: Temp:  [97.8 F (36.6 C)-98.7 F (37.1 C)] 98.4 F (36.9 C) (01/19 0600) Pulse Rate:  [98-124] 124 (01/19 0600) Resp:  [15-16] 16 (01/19 0600) BP: (124-140)/(81-96) 124/81 (01/19 0600) SpO2:  [100 %] 100 % (01/18 1321) Weight:  [61.1 kg] 61.1 kg (01/19 0600) Last BM Date: 11/05/21  Intake/Output from previous day: 01/18 0701 - 01/19 0700 In: 2810.6 [P.O.:660; I.V.:2150.6] Out: 2530 [Urine:2400; Drains:30; Stool:100] Intake/Output this shift: No intake/output data recorded.  General appearance: alert, cooperative, and no distress GI: distended, appropriately tender in lower abdomen; ostomy pink with some liquid stool, colostomy viable Incisions OK, JP drain with purulent drainage  Lab Results:  No results for input(s): WBC, HGB, HCT, PLT in the last 72 hours.  BMET Recent Labs    11/04/21 0901 11/06/21 0403  NA 139 140  K 3.9 4.1  CL 98 101  CO2 34* 30  GLUCOSE 123* 188*  BUN 33* 37*  CREATININE 0.84 0.64  CALCIUM 9.3 9.1    PT/INR No results for input(s): LABPROT, INR in the last 72 hours. ABG No results for input(s): PHART, HCO3 in the last 72 hours.  Invalid input(s): PCO2, PO2  Studies/Results: No results found.  Anti-infectives: Anti-infectives (From admission, onward)    Start     Dose/Rate Route Frequency Ordered Stop   10/22/21 1200  erythromycin 250 mg in sodium chloride 0.9 % 100 mL IVPB  Status:  Discontinued        250 mg 100 mL/hr over 60 Minutes Intravenous Every 6 hours 10/22/21 0841 10/31/21 0742   10/12/21 1000  piperacillin-tazobactam (ZOSYN) IVPB 3.375 g  Status:  Discontinued        3.375 g 12.5 mL/hr over 240 Minutes Intravenous Every 8 hours 10/12/21 0727 10/21/21 0855   09/21/2021 2200  cefoTEtan (CEFOTAN) 1 g in sodium chloride 0.9 % 100 mL IVPB  Status:   Discontinued        1 g 200 mL/hr over 30 Minutes Intravenous Every 12 hours 09/30/2021 1608 10/07/2021 1643   09/25/2021 2200  cefoTEtan (CEFOTAN) 2 g in sodium chloride 0.9 % 100 mL IVPB        2 g 200 mL/hr over 30 Minutes Intravenous  Once 09/30/2021 1644 09/26/2021 2212   10/08/2021 1158  sodium chloride 0.9 % with cefoTEtan (CEFOTAN) ADS Med       Note to Pharmacy: Georgena Spurling W: cabinet override      09/19/2021 1158 10/02/21 0014   09/17/21 1730  piperacillin-tazobactam (ZOSYN) IVPB 3.375 g        3.375 g 12.5 mL/hr over 240 Minutes Intravenous Every 8 hours 09/17/21 1631 09/22/21 1001   09/08/21 1400  cefTRIAXone (ROCEPHIN) 2 g in sodium chloride 0.9 % 100 mL IVPB       Note to Pharmacy: Pharmacy may adjust dosing strength / duration / interval for maximal efficacy   2 g 200 mL/hr over 30 Minutes Intravenous Every 24 hours 09/08/21 1307 09/14/21 1431   08/26/21 1000  metroNIDAZOLE (FLAGYL) IVPB 500 mg  Status:  Discontinued        500 mg 100 mL/hr over 60 Minutes Intravenous Every 12 hours 08/26/21 0830 08/26/21 0910   08/26/21 1000  piperacillin-tazobactam (ZOSYN) IVPB 3.375 g  Status:  Discontinued        3.375 g 12.5 mL/hr  over 240 Minutes Intravenous Every 8 hours 08/26/21 0910 09/01/21 0819   08/25/21 1030  metroNIDAZOLE (FLAGYL) tablet 500 mg  Status:  Discontinued        500 mg Per Tube Every 12 hours 08/25/21 0935 08/26/21 0830   08/25/21 1000  cefTRIAXone (ROCEPHIN) 2 g in sodium chloride 0.9 % 100 mL IVPB  Status:  Discontinued       Note to Pharmacy: Pharmacy may adjust dosing strength / duration / interval for maximal efficacy   2 g 200 mL/hr over 30 Minutes Intravenous Every 24 hours 08/25/21 0823 08/26/21 0910   08/25/21 1000  metroNIDAZOLE (FLAGYL) tablet 500 mg  Status:  Discontinued        500 mg Oral Every 12 hours 08/25/21 0837 08/25/21 0935   08/04/2021 2000  cefoTEtan (CEFOTAN) 2 g in sodium chloride 0.9 % 100 mL IVPB        2 g 200 mL/hr over 30 Minutes Intravenous  Every 12 hours 07/26/2021 1647 08/10/2021 2046   08/10/2021 0600  cefoTEtan (CEFOTAN) 2 g in sodium chloride 0.9 % 100 mL IVPB        2 g 200 mL/hr over 30 Minutes Intravenous On call to O.R. 07/31/2021 1610 07/28/2021 0806       Assessment/Plan: s/p Procedure(s): LYSIS OF ADHESION (N/A) SMALL BOWEL RESECTION X 2 WITH END COLOSTOMY (N/A)  Severe malnutrition, prolonged ileus:  cont NG   Dehydration: cont MIV   pSBO: due to pelvic adhesions from pelvic abscess.  Pt unable to tolerate NG, IR not able to place, pt unwilling to undergo NG placement in fluoro, IR unable to place decompressive PEG tube.  Pt unable to tolerate a PO diet, taken to OR 12/14 for ex lap.  Several loops of small bowel adherent to completely dehisced anastomosis.  SBR x2 with endo colostomy, pathology c/w carcinomatosis.  NG removed 1/12 but pt became increasingly more distended.  Replaced in [facility] /116  Pt needs decompressive G tube.  Unable to be placed in IR due to overlying dilated small bowel.  Will have to be placed in the OR when time available.    Anemia: most likely related to chronic illness and malnutrition.  Iron supplements ordered but pt did not tolerate this.  Iron levels low.  IV iron given in early Dec, tachy overnight, will recheck and give blood as needed  Anastomotic dehiscence  - JP drainage catheter appears to be in appropriate position, Zosyn d/c'd 11/14.  wbc normal.  - Rpt CT 11/20 shows increased abscess size with drain in correct position.   restarted drain flushes.  No sign of systemic infection.  Discussed with IR on 11/21.  No further targets available for additional drainage.   7 day course of abx repeated.   - CT 11/28 fluid collection more organized but slightly smaller, IR placed TG drain 11/30, Zosyn x5 days -12/8: GGF enema shows anastomotic leak, controlled with drain - 12/14 complete dehischence of anastomosis found on ex lap.  Colostomy created - 12/22 Abd JP removed CT 12/24 shows  continued pelvic abscess and new RLQ fluid collection.  Will restart Zosyn and have IR eval for drainage tomorrow CT 12/29 shows improving fluid collections and continued ileus  ID: wbc elevated off abx.  Zosyn course completed and wbc normal now 1/5: IR drain advanced with good purulent output CT 1/14: shows continued bowel obstruction with TP in RLQ  Physical deconditioning: Ambulate in hall TID, appreciate PT/OT involvement (Pt refusing less interventions now)  Narcotic dependence: dilaudid prn for pain, will wean slowly, Palliative has seen and started Fentanyl and Lidoderm patch  Urinary retention: foley replaced on Mon 11/1.  foley out again 11/7, failed voiding trial 11/9 with elevated PVR.   Foley replaced, will need urology f/u as outpatient.   Pt appeared to have a neurogenic bladder during ex lap, foley out, straight cath q8h but pt develpoed increased Cr so foley replaced, Cr normalized  Pt with forming carcinomatous on recent exlap and new liver lesions concerning for mets on most recent CT.  Dr Benay Spice and pt aware.  Nothing further to do until recovered from GI standpoint, which may be months to never  Dispo: pt has a temporary housing plan in place at discharge in [place] with his Aunt.  TOC team consulted and working with housing authority to assist with long term housing.    There are no chemo options until GI function improves.  I don't foresee this improving anytime in the near future, if ever, given very minimal progress over the past 3 months.  Discussed with him the option of going to hospice now vs continuing with possible indefinite IV nutrition and gastric drainage via a peg tube.  Patient would like to think about this and discuss with his family.      LOS: 83 days    Phillip Brooks 07/04/3845

## 2021-11-06 NOTE — Plan of Care (Signed)
  Problem: Education: Goal: Knowledge of General Education information will improve Description Including pain rating scale, medication(s)/side effects and non-pharmacologic comfort measures Outcome: Progressing   

## 2021-11-06 NOTE — Progress Notes (Addendum)
Palliative Care Progress Note  Patient having rapid decline today. He has increasing pain medication requirements and I was notified by the RN that he was setting his alarm on his phone. I evaluated [name] and he describes intense nausea and severe pain in his left upper quadrant. The persistent and severe nausea is new for him. He is also having existential suffering-afraid to be left alone, wanting staff to stay at his bedside. He is able to tell me that his goals of care are for comfort and that he is not comfortable. RN present asked him if comfortable meant sleeping and he said when the pain got better he would be able to sleep. He also endorses wanting to feel a rush of relief that IV push hydromorphone gives him and is requesting RN push the medication and flush quickly.  Recommendations: Started continuous infusion of IV hydromorphone 1mg /hr-hopefully will reduce his requests for q2 boluses.  Nausea seems to be contributing to his discomfort-will give him a one time dose of IV haldol to get his severe nausea under control and to also help with his anxiety and fear of being left alone. He has not gotten relief from any of the prn nausea meds including valium. Clonidine patch to help with opioid craving and stress. Valium for anxiety PRN  Consider stopping TPN given worsening nausea.  Discussed with Dr. Marcello Moores- the earliest she could place a laparoscopic PEG tube would be in 2 weeks.I am not sure he will survive 2 weeks based on his rapid decline throughout the day.  Lane Hacker, DO Palliative Medicine   Time: 55 minutes

## 2021-11-07 MED ORDER — ONDANSETRON 8 MG/NS 50 ML IVPB
8.0000 mg | Freq: Four times a day (QID) | INTRAVENOUS | Status: DC
  Filled 2021-11-07 (×4): qty 54

## 2021-11-10 ENCOUNTER — Other Ambulatory Visit (HOSPITAL_COMMUNITY): Payer: Self-pay

## 2021-11-18 ENCOUNTER — Inpatient Hospital Stay: Admit: 2021-11-18 | Payer: Medicare HMO | Admitting: General Surgery

## 2021-11-18 SURGERY — CREATION, GASTROSTOMY, LAPAROSCOPIC
Anesthesia: General

## 2021-11-19 NOTE — Progress Notes (Signed)
°   11/06/21 2311  Assess: MEWS Score  Temp 99.5 F (37.5 C)  BP 131/89  Pulse Rate (!) 124  Resp 18  Level of Consciousness Alert  SpO2 100 %  O2 Device Room Air  Assess: MEWS Score  MEWS Temp 0  MEWS Systolic 0  MEWS Pulse 2  MEWS RR 0  MEWS LOC 0  MEWS Score 2  MEWS Score Color Yellow  Assess: if the MEWS score is Yellow or Red  Were vital signs taken at a resting state? Yes  Focused Assessment No change from prior assessment  Does the patient meet 2 or more of the SIRS criteria? No  Does the patient have a confirmed or suspected source of infection? No  Provider and Rapid Response Notified? No  MEWS guidelines implemented *See Row Information* No, vital signs rechecked  Treat  MEWS Interventions Escalated (See documentation below)  Pain Scale 0-10  Pain Score 10  Faces Pain Scale 2  Pain Type Surgical pain  Pain Location Abdomen  Pain Orientation Anterior  Pain Radiating Towards Drain Site  Pain Descriptors / Indicators Constant  Pain Frequency Constant  Pain Onset On-going  Patients Stated Pain Goal 0  Pain Intervention(s) Medication (See eMAR)  Multiple Pain Sites No  Breathing 0  Negative Vocalization 1  Facial Expression 0  Body Language 0  Consolability 0  PAINAD Score 1  Complains of Nausea /  Vomiting  Neuro symptoms relieved by Rest  Nausea relieved by Antiemetic  Patients response to intervention Effective  Take Vital Signs  Increase Vital Sign Frequency  Yellow: Q 2hr X 2 then Q 4hr X 2, if remains yellow, continue Q 4hrs  Escalate  MEWS: Escalate Yellow: discuss with charge nurse/RN and consider discussing with provider and RRT  Notify: Charge Nurse/RN  Name of Charge Nurse/RN Notified Richelle  Date Charge Nurse/RN Notified 11/18/21  Time Charge Nurse/RN Notified 0058  Assess: SIRS CRITERIA  SIRS Temperature  0  SIRS Pulse 1  SIRS Respirations  0  SIRS WBC 0  SIRS Score Sum  1

## 2021-11-19 NOTE — Plan of Care (Signed)
Pt deceased at 28 verified by [place] and [place], Rns. Leighton Ruff notified. Pt present in room at time of death. PIVs, port, JP, foley removed by NT. [employer] notified.   Problem: Education: Goal: Knowledge of General Education information will improve Description: Including pain rating scale, medication(s)/side effects and non-pharmacologic comfort measures Outcome: Not Met (add Reason)   Problem: Health Behavior/Discharge Planning: Goal: Ability to manage health-related needs will improve Outcome: Not Met (add Reason)   Problem: Clinical Measurements: Goal: Ability to maintain clinical measurements within normal limits will improve Outcome: Not Met (add Reason) Goal: Will remain free from infection Outcome: Not Met (add Reason) Goal: Diagnostic test results will improve Outcome: Not Met (add Reason) Goal: Cardiovascular complication will be avoided Outcome: Not Met (add Reason)   Problem: Activity: Goal: Risk for activity intolerance will decrease Outcome: Not Met (add Reason)   Problem: Nutrition: Goal: Adequate nutrition will be maintained Outcome: Not Met (add Reason)   Problem: Coping: Goal: Level of anxiety will decrease Outcome: Not Met (add Reason)   Problem: Elimination: Goal: Will not experience complications related to bowel motility Outcome: Not Met (add Reason) Goal: Will not experience complications related to urinary retention Outcome: Not Met (add Reason)   Problem: Pain Managment: Goal: General experience of comfort will improve Outcome: Not Met (add Reason)   Problem: Safety: Goal: Ability to remain free from injury will improve Outcome: Not Met (add Reason)   Problem: Skin Integrity: Goal: Risk for impaired skin integrity will decrease Outcome: Not Met (add Reason)   Problem: Education: Goal: Required Educational Video(s) Outcome: Not Met (add Reason)   Problem: Clinical Measurements: Goal: Ability to maintain clinical measurements  within normal limits will improve Outcome: Not Met (add Reason) Goal: Postoperative complications will be avoided or minimized Outcome: Not Met (add Reason)   Problem: Skin Integrity: Goal: Demonstration of wound healing without infection will improve Outcome: Not Met (add Reason)

## 2021-11-19 NOTE — Significant Event (Addendum)
Rapid Response Event Note   Reason for Call :  Hypotensive    Initial Focused Assessment:  Patient with a hypotensive crisis,  is alert to voice and responds inappropriately to questions.  Primary nurse concerned that NG tube was not functioning through out the night. States that he flushed it per protocol, functioning now.   Primary nurse paged Dr. Marcello Moores, and Dr. Hilma Favors  Interventions:  Paused Fentanyl 500 ml bolus  Surgical PA at bedside.    Plan of Care:  Order for Full comfort care.  Event Summary:  Notified Family with change of condition. Restarted Fentanyl GTT @ 50 MCG  MD Notified:  Call Time: Arrival Time: End Time:  Phillip Carbo Anderson [place], RN

## 2021-11-19 NOTE — Death Summary Note (Signed)
DEATH SUMMARY   Patient Details  Name: Phillip Brooks MRN: [id number] DOB: 1956/06/06  Admission/Discharge Information   Admit Date:  2021/08/19  Date of Death: Date of Death: 2021-11-11  Time of Death: Time of Death: 01-05-1110  Length of Stay: 44  Referring Physician: System, Provider Not In   Reason(s) for Hospitalization  Stage 4 rectal cancer  Diagnoses  Preliminary cause of death: Rectal cancer metastasized to liver [facility])  Secondary Diagnoses (including complications and co-morbidities):  Active Problems:   Rectal cancer (HCC)   Protein-calorie malnutrition, severe Malignant bowel obstruction Pelvic abscess  Brief Hospital Course (including significant findings, care, treatment, and services provided and events leading to death)  Phillip Brooks is a 66 y.o. year old male who was admitted to the hospital after LAR for incidentally found locally perforated rectal cancer. He developed a pelvic abscess and a CT guided drain was placed.  Patient did not improve with full return of bowel function and was noted to have bowel obstruction.   He was taken to the OR 2 months later for ex lap, lysis of adhesions and small bowel resection due to adhesions to anastomotic dehiscence.  He was converted to an end colostomy.  NG was left for decompression.  Final pathology and surgical findings were consistent with forming carcinomatosis.  He began to develop enlarging liver mets during this time as well.  He continue to have an ileus, despite prolonged NG decompression.  After ~4 wks, he began to have bowel function and NG was removed.  He developed worsening obstruction over the next 5 days and CT was obtained.  This showed worsening SBO with a transition point in the RLQ.  He underwent replacement of NGT in Fluoro.  Given rapid decompensation and CT findings, he was not felt to have a recoverable condition.  His oncologist evaluated the patient as well and did not feel there were any medical options for  treatment.  He continued to decline rapidly.  Palliative consult was obtained and patient was switched to comfort care measures per his wishes.  He expired ~48h later.       Pertinent Labs and Studies  Significant Diagnostic Studies CT ABDOMEN PELVIS W CONTRAST  Result Date: 11/01/2021 CLINICAL DATA:  History of colon cancer, previous low anterior resection with diverting colostomy and right lower quadrant ileostomy. EXAM: CT ABDOMEN AND PELVIS WITH CONTRAST TECHNIQUE: Multidetector CT imaging of the abdomen and pelvis was performed using the standard protocol following bolus administration of intravenous contrast. RADIATION DOSE REDUCTION: This exam was performed according to the departmental dose-optimization program which includes automated exposure control, adjustment of the mA and/or kV according to patient size and/or use of iterative reconstruction technique. CONTRAST:  75mL OMNIPAQUE IOHEXOL 350 MG/ML SOLN COMPARISON:  10/16/2021 FINDINGS: Lower chest: Hypoventilatory changes are seen at the lung bases. No acute pleural or parenchymal lung disease. Hepatobiliary: Multiple hepatic hypodensities are again identified, with interval increase in size consistent with progression of disease. Index lesions are as follows: Right lobe, image 20/2, 2.2 cm.  Previously 1.6 cm. Caudate, image 20/2, 2.5 cm.  Previously 1.7 cm. Left lobe, image 24/2, 2.3 cm.  Previously 1.8 cm. Right lobe, image 27/2, 2.1 cm.  Previously 1.4 cm. Gallbladder is unremarkable.  No biliary duct dilation. Pancreas: Unremarkable. No pancreatic ductal dilatation or surrounding inflammatory changes. Spleen: Normal in size without focal abnormality. Adrenals/Urinary Tract: Foley catheter decompresses the bladder. Kidneys enhance normally and symmetrically. No urinary tract calculi or obstructive uropathy. Adrenals are  grossly normal. Stomach/Bowel: Progressive small bowel dilation, now measuring up to 6.6 cm within the right mid abdomen.  Distal small bowel just proximal to the right lower quadrant ileostomy is decompressed. Findings are consistent with small-bowel obstruction. Colon is decompressed, with residual oral contrast again identified. Left lower quadrant colostomy unchanged. Vascular/Lymphatic: Stable atherosclerosis. No discrete adenopathy within the abdomen or pelvis. Reproductive: Prostate is mildly enlarged but stable. Other: Presacral gas and fluid collection measuring 3.6 x 4.9 cm consistent with chronic abscess. Indwelling percutaneous pigtail drainage catheter unchanged. Minimal decrease in size since prior study. No free intraperitoneal fluid or free gas. No abdominal wall hernia. Musculoskeletal: No acute or destructive bony lesions. Reconstructed images demonstrate no additional findings. IMPRESSION: 1. High-grade small bowel obstruction, transition within the distal small bowel just proximal to the right lower quadrant ileostomy. 2. Progressive hepatic metastases, with increased size of liver lesions since prior study. 3. Grossly stable presacral abscess with indwelling pigtail drainage catheter. Electronically Signed   By: Randa Ngo M.D.   On: 11/01/2021 18:46   CT ABDOMEN PELVIS W CONTRAST  Result Date: 10/16/2021 CLINICAL DATA:  Follow-up intra-abdominal abscess. History of rectal cancer with low anterior resection and diverting ileostomy. EXAM: CT ABDOMEN AND PELVIS WITH CONTRAST TECHNIQUE: Multidetector CT imaging of the abdomen and pelvis was performed using the standard protocol following bolus administration of intravenous contrast. CONTRAST:  47mL OMNIPAQUE IOHEXOL 350 MG/ML SOLN COMPARISON:  CT abdomen pelvis with IV contrast 10/11/2021, CT without contrast 09/22/2021. FINDINGS: Lower chest: There is posterior atelectasis both lung bases. Small left pleural effusion noted on December 24 has resolved since. No right pleural effusion. Rest of the lung bases are clear. The cardiac size is normal. NGT enters the  stomach but the proximal side hole only 3 cm below the hiatus and could be advanced further in 10 cm for better positioning. Hepatobiliary: Gallbladder and bile ducts are unremarkable. 4 presumed liver metastases are again noted. There is a 1.7 cm right hepatic lesion posterior to the IVC (20/93 of series 2) measuring 1.4 cm 5 days ago. In the lower lateral right lobe there is a 1.6 cm lesion on axial image 24 which was previously 1.2 cm. In the left lobe lateral segment on this same image there is a 1.8 cm lesion which was previously 1.6 cm. In the caudal right lobe on axial image 33 there is a 1.4 cm low-density lesion which was previously 1.4 cm. No further or new hepatic lesions are seen. Pancreas: No mass enhancement , ductal dilatation or surrounding edema. Spleen: Normal. Adrenals/Urinary Tract: There is no adrenal mass. No renal cortical mass, renal calculus or hydronephrosis. Bladder is catheterized , partially fluid distended. Small amount of air in the anterior bladder. Mild bladder thickening or changes of underdistention. Stomach/Bowel: Stomach is mostly decompressed. NGT positioning as described above. The duodenum is decompressed. Beginning in the jejunum there is diffuse small-bowel dilatation up to 4.8 cm which was noted previously on both prior studies, only slightly improved from December 5 and not changed at all since December 24. A discrete transition is not identified but small bowel loops in the right lower abdomen extending to the right lower quadrant ileostomy are decompressed. There is contrast in the colon with left lower quadrant descending colostomy and rectosigmoid resection again shown. Small bowel anastomoses are again noted in the lower abdomen but they appear patent. Left posterior approach pigtail drainage catheter today is only partially within the presacral air-fluid collection described previously, with rim enhancement.  Current measurements 5.4 x 3.5 cm, previously 5.5 x 4.3 cm.  Only a small portion of the pigtail rests within the collection on the current exam, was previously entirely within it. Repositioning may be indicated. Vascular/Lymphatic: Trace aortic calcific plaque and mild iliac calcifications. No AAA. Mild aortic tortuosity. There are no appreciable enlarged lymph nodes. Reproductive: Prostatomegaly, transverse axis 4.6 cm. Multiple pelvic sidewall phleboliths. Other: Small umbilical and inguinal fat hernias. Central pelvic gas collection centered posteriorly is again noted on image 60/93 of series 2, currently measuring 5.8 x 1.5 cm, previously 6.2 x 1.8 cm, beginning just above the presacral abscess. Additional loculated air-fluid collection just deep to the right rectus sheath in the right lower abdomen today measures 4.4 x 1.2 cm, with rim enhancement (axial 47/93), previously 9.4 x 2.1 cm. Overlying the left low anterior pelvic rectus sheath, there is new demonstration of a small air pocket on axial 65/93 measuring 2.3 x 0.8 cm. Musculoskeletal: No destructive osseous or lytic lesions. IMPRESSION: 1. 3 of 4 hepatic masses, presumed metastases, are slightly larger than 5 days ago but no new metastasis is appreciated. 2. Interval resolution of the prior left pleural effusion. Persistent posterior basal atelectasis. 3. NGT enters the stomach but could be advanced 10 cm for better placement. The stomach and duodenum are decompressed. 4. Persisting fairly diffuse small bowel dilatation except in the right lower quadrant extending up to the right lower quadrant ileostomy. An exact transitional segment could not be identified but findings are probably due to partial obstruction related to occult adhesions. 5. Very little change in the presacral abscess today but the pigtail segment of the drainage catheter today is only partially within the collection and repositioning may be warranted. 6. Central pelvic gas collection just above this is slightly smaller today as is a rim  enhancing fluid collection deep to the right rectus sheath in the right lower abdomen, with new demonstration of a small subcutaneous air pocket overlying the low anterior pelvic rectus sheath on the left. 7. Bladder catheterized and mildly thickened or exaggerated from nondistention, with mild fluid distension despite catheterization. Correlate clinically for catheter dysfunction. 8. In all other respects no further changes are seen. Electronically Signed   By: Telford Nab M.D.   On: 10/16/2021 21:19   CT ABDOMEN PELVIS W CONTRAST  Result Date: 10/11/2021 CLINICAL DATA:  Intra-abdominal abscess. History of rectal cancer. Status post low anterior resection with diverting ileostomy. EXAM: CT ABDOMEN AND PELVIS WITH CONTRAST TECHNIQUE: Multidetector CT imaging of the abdomen and pelvis was performed using the standard protocol following bolus administration of intravenous contrast. CONTRAST:  54mL OMNIPAQUE IOHEXOL 350 MG/ML SOLN COMPARISON:  09/22/2021 FINDINGS: Lower chest: Dependent atelectasis bilaterally is mildly progressive in the interval with new small left pleural effusion. Hepatobiliary: 10 mm low-density lesion in the inferior right liver (28/2) not definitely seen previously. A second medial right liver lesion posterior to the IVC measuring 13 mm on 24/2 is new in the interval. A third subtle 16 mm lesion in the lateral segment left liver noted on image 30/2. Finally, 14 mm ill-defined low-density lesion inferior right liver on 34/2. Previous exam was without intravenous contrast. Comparing to a postcontrast CT of 09/15/2021, these lesions also appear new since that exam. Gallbladder is nondistended. No intrahepatic or extrahepatic biliary dilation. Pancreas: No focal mass lesion. No dilatation of the main duct. No intraparenchymal cyst. No peripancreatic edema. Spleen: No splenomegaly. No focal mass lesion. Adrenals/Urinary Tract: No adrenal nodule or mass. Mild right  hydronephrosis is associated  with minimal right hydroureter. Left kidney unremarkable. No left hydroureter. Gas is visible in the urinary bladder, presumably secondary to recent instrumentation. Stomach/Bowel: Stomach is nondistended with NG tube tip in the proximal stomach. Duodenum is normally positioned as is the ligament of Treitz. Small bowel loops of the abdomen are fluid-filled and distended up to 4.8 cm diameter. Generally, the small bowel dilatation appears decreased since the abdomen only CT of 09/22/2021 and is similar to minimally decreased comparing to 09/15/2021. Patient has undergone lysis of adhesion with small-bowel resection since the prior 2 studies (postop date 10/04/2021) with small bowel anastomosis identified left paramidline abdomen at the level of the umbilicus and right lower quadrant. A discrete transition zone for the small-bowel obstruction cannot be identified on the current study. Small bowel extending into the right lower quadrant ileostomy is decompressed. Residual contrast material noted in a nondilated colon with left lower quadrant sigmoid end colostomy evident. Percutaneous drainage catheter is coiled in a presacral abscess measuring 5.5 x 4.3 cm on image 68/2 today. Vascular/Lymphatic: No abdominal aortic aneurysm. No abdominal lymphadenopathy. No pelvic sidewall lymphadenopathy. Reproductive: The prostate gland and seminal vesicles are unremarkable. Other: On today's exam, there is new gas and debris in the central pelvis, in the region of the peritoneal reflection (image 61/2). There is also a loculated collection of fluid and gas in the anterior right lower quadrant just deep to the rectus sheath measuring 9.4 x 2.2 cm on image 53/2. 1.9 cm collection of gas and fluid is identified in the midline rectus sheath of the pelvis (65/2). Musculoskeletal: No worrisome lytic or sclerotic osseous abnormality. IMPRESSION: 1. Interval lysis of small-bowel adhesion with small-bowel resection and 2 areas of small  bowel anastomosis identified low abdomen/pelvis. Small bowel remains dilated on today's study, measuring up to 4.8 cm diameter, suggesting obstruction. Discrete transition zone is not identified although given the presence of increasing extraluminal gas/debris in the central pelvis and an apparent loculated rim enhancing abscess in the anterior right pelvis, inflammatory ileus would be a consideration. Obstruction secondary to adhesion also a possibility. 2. Interval development of increasing gas and debris in the central pelvis, cranial to the presacral abscess in the region of the peritoneal reflection. 3. New loculated collection of fluid and gas in the anterior right lower quadrant just deep to the rectus sheath measuring 9.4 x 2.2 cm and compatible with abscess. 4. 1.9 cm collection of gas and fluid is identified in the midline rectus sheath of the pelvis. Residual gas in the rectus sheath would not be expected more than 10 days after surgery and a small rectus sheath abscess is not excluded. 5. Interval development of multiple low-density lesions in the liver. Given the presence of infection and relatively rapid appearance, these could represent tiny abscesses although new metastatic disease to the liver is also a consideration. 6. Mild right hydronephrosis with minimal right hydroureter. 7. Gas in the urinary bladder, presumably secondary to recent instrumentation. 8. New small left pleural effusion. Electronically Signed   By: Misty Stanley M.D.   On: 10/11/2021 15:38   IR Catheter Tube Change  Result Date: 10/23/2021 INDICATION: 66 year old male with a history of rectal cancer status post low anterior resection and diverting ileostomy. Unfortunately, patient has a chronic presacral abscess and his drainage catheter is partially displaced. He presents for catheter exchange and repositioning. EXAM: Drain exchange MEDICATIONS: The patient is currently admitted to the hospital and receiving intravenous  antibiotics. The antibiotics were administered within  an appropriate time frame prior to the initiation of the procedure. ANESTHESIA/SEDATION: None. COMPLICATIONS: None immediate. PROCEDURE: Informed written consent was obtained from the patient after a thorough discussion of the procedural risks, benefits and alternatives. All questions were addressed. Maximal Sterile Barrier Technique was utilized including caps, mask, sterile gowns, sterile gloves, sterile drape, hand hygiene and skin antiseptic. A timeout was performed prior to the initiation of the procedure. Contrast was injected through the existing catheter. The catheter at the margin of a large presacral fluid collection which was opacified with contrast. The existing catheter was transected and removed over a Bentson wire. The Bentson wire was navigated into the fluid collection. A new 10.2 [place] all-purpose drainage catheter was advanced over the wire and formed. Aspiration was performed yielding approximately 30 mL of purulent fluid and debris. The drainage catheter was secured in place with 0 silk suture and reconnected to JP bulb suction. IMPRESSION: Successful exchange and repositioning of transgluteal presacral drainage catheter. Approximately 30 mL of purulent fluid and debris was successfully aspirated. Electronically Signed   By: Jacqulynn Cadet M.D.   On: 10/23/2021 17:07   DG ABD ACUTE 2+V W 1V CHEST  Result Date: 10/19/2021 CLINICAL DATA:  Evaluate for small bowel obstruction. EXAM: DG ABDOMEN ACUTE WITH 1 VIEW CHEST COMPARISON:  10/16/2021 FINDINGS: Right chest wall port a catheter noted with tip projecting over the distal SVC. NG tube is again noted with tip projecting over the proximal stomach and side port approximately 2 cm above the GE junction. Enteric contrast material is again identified within the colon. This is unchanged in position from the previous exam. Pigtail drainage catheter is again noted within the left pelvis.  Unchanged appearance of dilated small bowel loops. Heart size and mediastinal contours appear normal. Lungs are clear. No signs of pleural effusion or edema. IMPRESSION: 1. No change in the appearance of dilated small bowel loops compatible with partial small bowel obstruction. 2. Enteric contrast material within the colon is unchanged in position. 3. Stable position of pigtail drainage catheter within the left pelvis. Electronically Signed   By: Kerby Moors M.D.   On: 10/19/2021 12:50   DG Abd Portable 1V  Result Date: 10/20/2021 CLINICAL DATA:  Nasogastric tube placement EXAM: PORTABLE ABDOMEN - 1 VIEW COMPARISON:  10/19/2021 FINDINGS: Nasogastric tube tip is seen looped into the gastric fundus. Multiple gas-filled dilated loops of a small bowel are again seen throughout the abdomen in keeping with a partial small bowel obstruction. Residual contrast again noted within a nondistended colon, unchanged from prior examination. Pigtail drainage catheter noted within the left hemipelvis. IMPRESSION: Nasogastric tube tip looped within the gastric fundus. Findings suggestive of a partial small bowel obstruction, similar to that noted on prior CT examination of 10/16/2021. Electronically Signed   By: Fidela Salisbury M.D.   On: 10/20/2021 02:54   DG Loyce Dys Tube Plc W/Fl W/Rad  Result Date: 11/03/2021 CLINICAL DATA:  Enteric tube placement. EXAM: NASO G TUBE PLACEMENT WITH FL AND WITH RAD CONTRAST:  None. FLUOROSCOPY TIME:  Fluoroscopy Time:  6 seconds Radiation Exposure Index (if provided by the fluoroscopic device): 1.7 mGy Number of Acquired Spot Images: 0 COMPARISON:  None. PROCEDURE/FINDINGS: This exam was performed by Rushie Nyhan NP, and was supervised and interpreted by Fabiola Backer MD. The nasogastric tube was lubricated with viscous lidocaine and inserted into the nostril. Under fluoroscopic guidance, the nasogastric tube was advanced into stomach, with the tip terminating in the gastric fundus.  The tube was  affixed to the patient's nose with tape. The patient tolerated the procedure well without immediate postprocedural complication. IMPRESSION: Successful fluoroscopic guided placement of nasogastric tube with tip in the gastric fundus. The tube is ready for immediate use. Electronically Signed   By: Titus Dubin M.D.   On: 11/03/2021 15:15    Microbiology No results found for this or any previous visit (from the past 240 hour(s)).  Lab Basic Metabolic Panel: Recent Labs  Lab 11/01/21 0505 11/03/21 0412 11/04/21 0901 11/06/21 0403  NA 138 140 139 140  K 4.2 4.0 3.9 4.1  CL 98 99 98 101  CO2 31 31 34* 30  GLUCOSE 174* 155* 123* 188*  BUN 29* 31* 33* 37*  CREATININE 0.65 0.73 0.84 0.64  CALCIUM 9.1 9.2 9.3 9.1  MG 2.1 2.2 2.3 2.4  PHOS 2.7 3.6 3.8 3.4   Liver Function Tests: Recent Labs  Lab 11/03/21 0412 11/06/21 0403  AST 39 29  ALT 37 50*  ALKPHOS 73 80  BILITOT 0.3 0.3  PROT 7.5 7.5  ALBUMIN 2.7* 2.6*   No results for input(s): LIPASE, AMYLASE in the last 168 hours. No results for input(s): AMMONIA in the last 168 hours. CBC: Recent Labs  Lab 11/03/21 0412  WBC 8.2  NEUTROABS 6.6  HGB 8.3*  HCT 27.8*  MCV 87.4  PLT 399   Cardiac Enzymes: No results for input(s): CKTOTAL, CKMB, CKMBINDEX, TROPONINI in the last 168 hours. Sepsis Labs: Recent Labs  Lab 11/03/21 0412  WBC 8.2    Procedures/Operations  Robotic assisted LAR Exlap, SBR, colostomy formation CT guided drain placement   Rosario Adie 04/11/7627, 12:14 PM

## 2021-11-19 DEATH — deceased

## 2021-12-20 IMAGING — CR DG BE SINGLE CONTRAST
10 of 12 series · 15 of 23 positions shown · non-contrast
Comparison: CT scan 09/15/2021

CLINICAL DATA: Evaluate for anastomotic leak.  History [DATE].

EXAM:
WATER SOLUBLE CONTRAST ENEMA
TECHNIQUE: Contrast enema was performed using water-soluble contrast material
per rectum.
FLUOROSCOPY TIME:  Fluoroscopy Time:  2 minutes and 12 seconds
Radiation Exposure Index (if provided by the fluoroscopic device):
42.9 mGy
Number of Acquired Spot Images: 0

[t abdomen (1 of 2)]
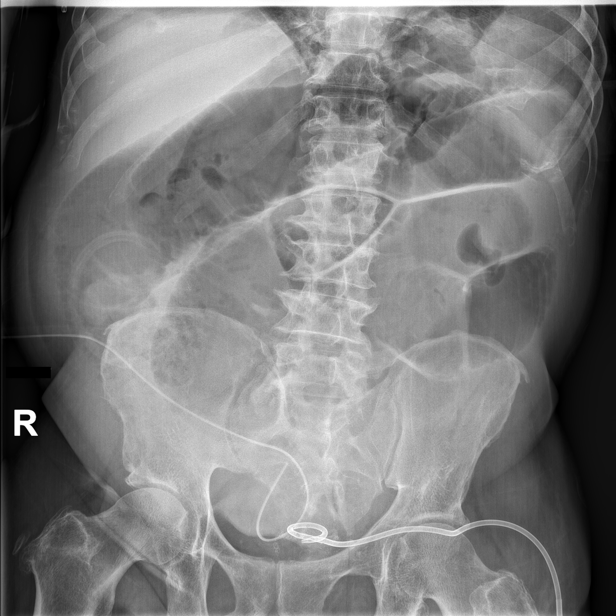

[Series 2: cp_standard · 2 of 113 frames shown (1 of 2)]
[frame 18/113]
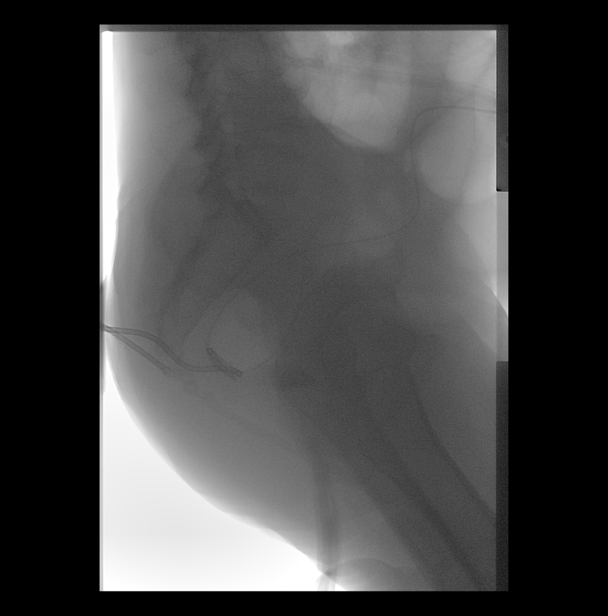
[frame 57/113]
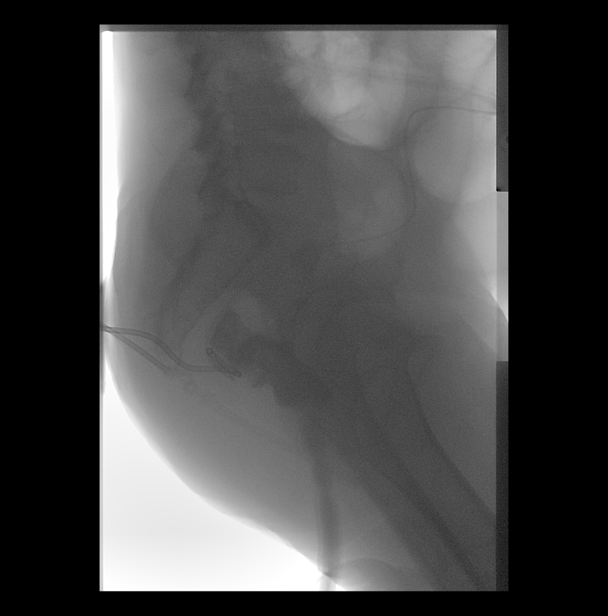

[Series 3: fluoro_barium 2fps_bw · 2 of 2 frames shown (1 of 6)]
[frame 1/2]
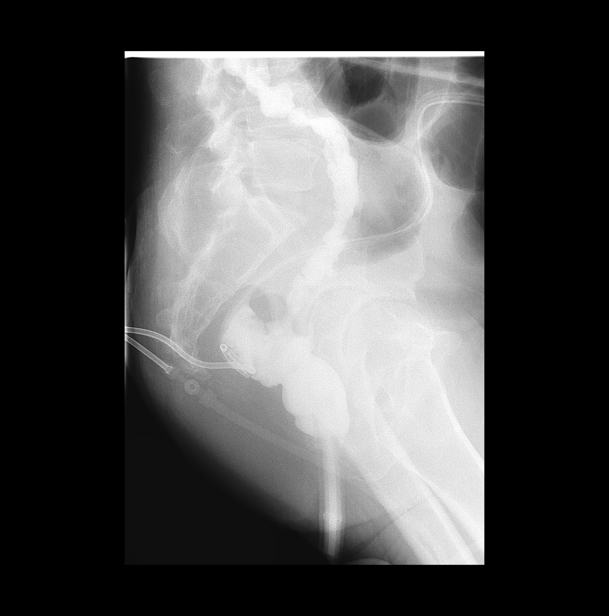
[frame 2/2]
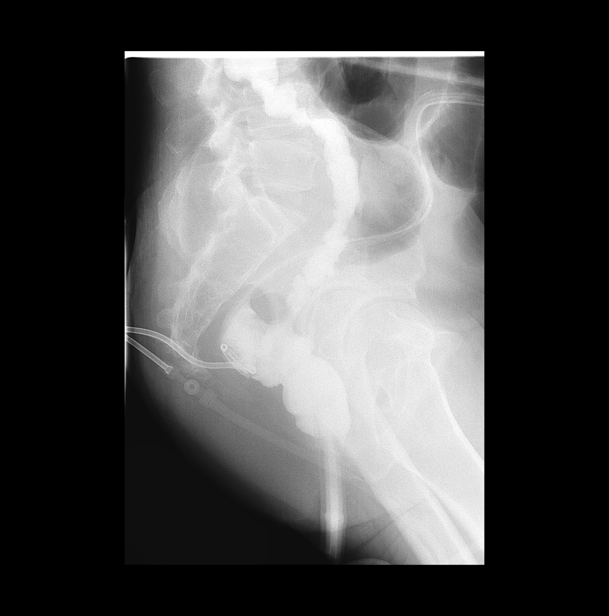

[Series 5: cp_standard · 3 of 85 frames shown (2 of 2)]
[frame 13/85]
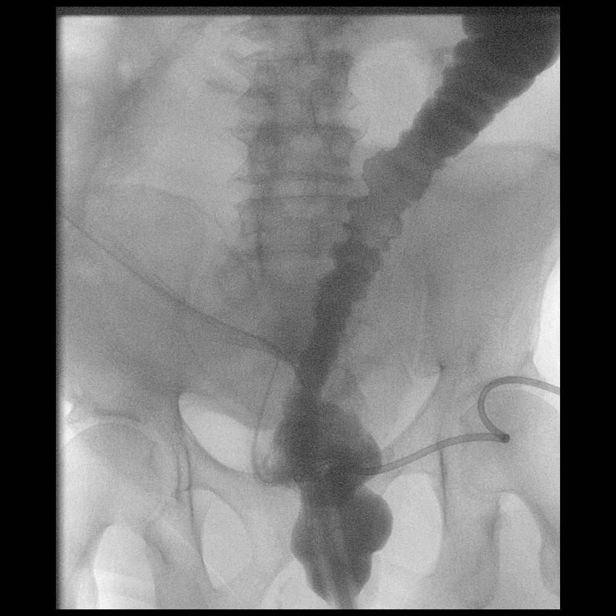
[frame 43/85]
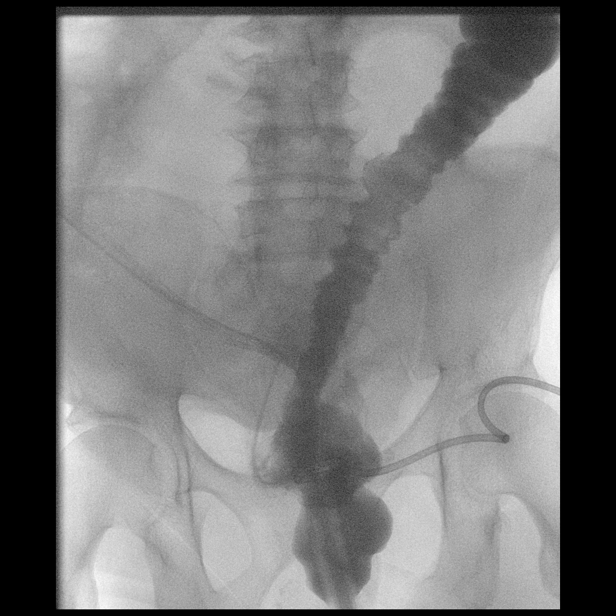
[frame 78/85]
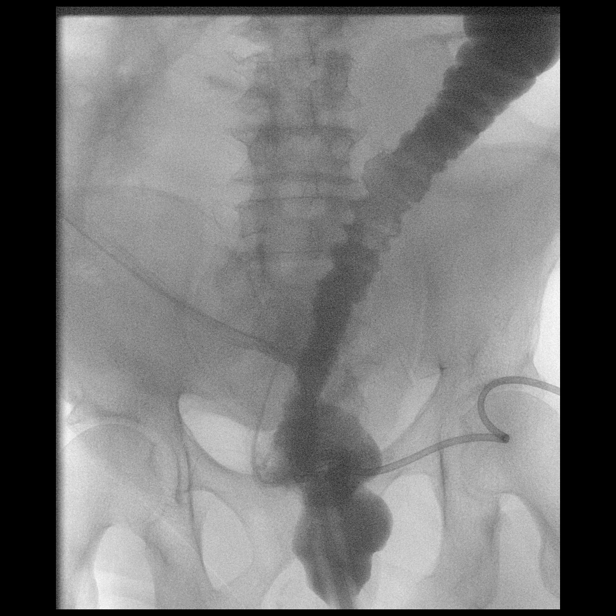

[fluoro_barium 2fps_bw (2 of 6)]
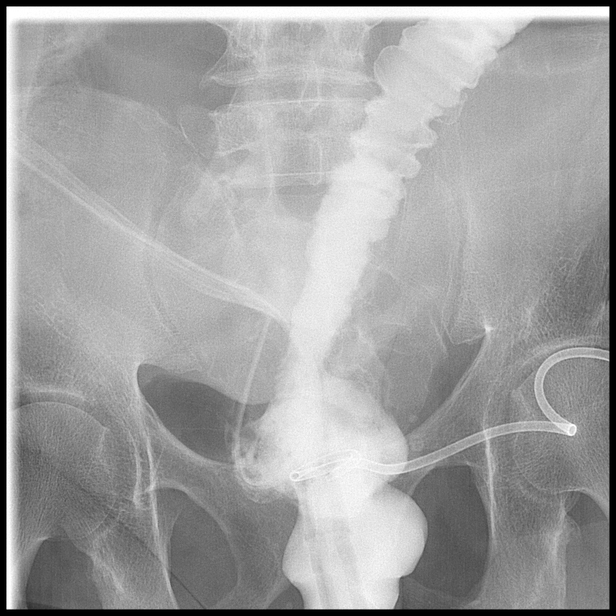

[fluoro_barium 2fps_bw (3 of 6)]
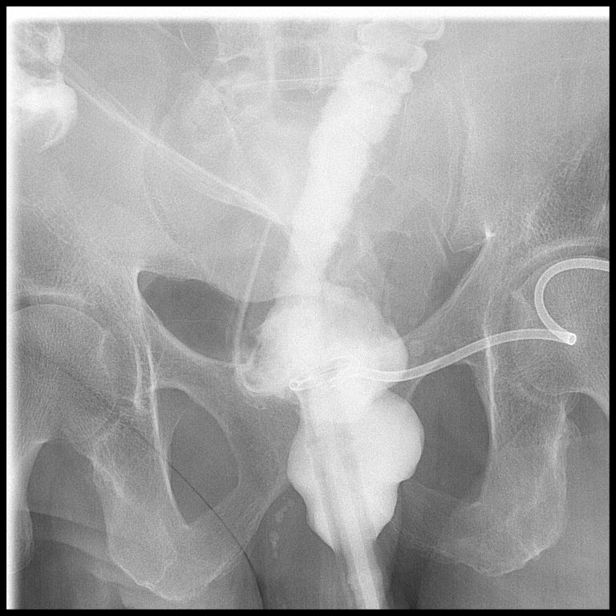

[fluoro_barium 2fps_bw (4 of 6)]
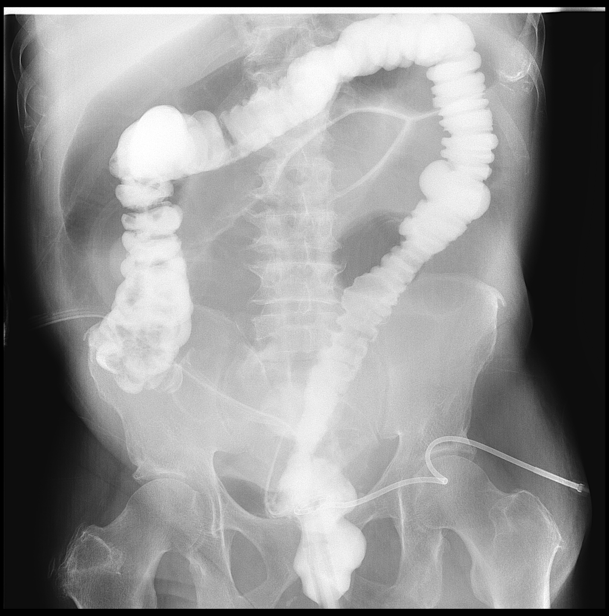

[fluoro_barium 2fps_bw (5 of 6)]
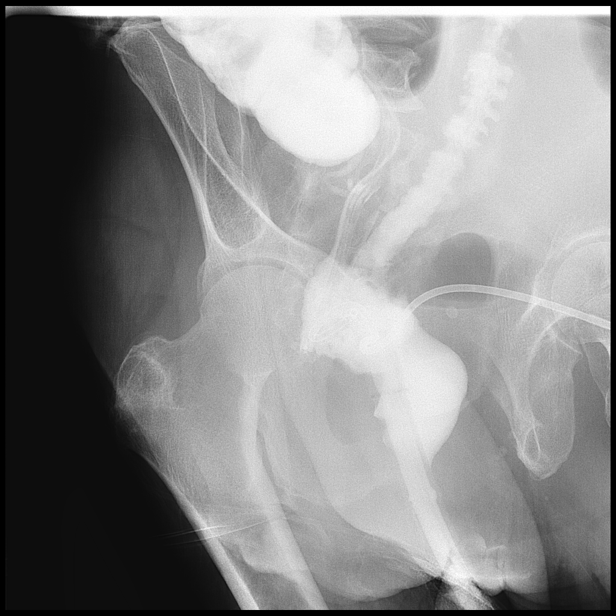

[Series 10: fluoro_barium 2fps_bw · 2 of 2 frames shown (6 of 6)]
[frame 1/2]
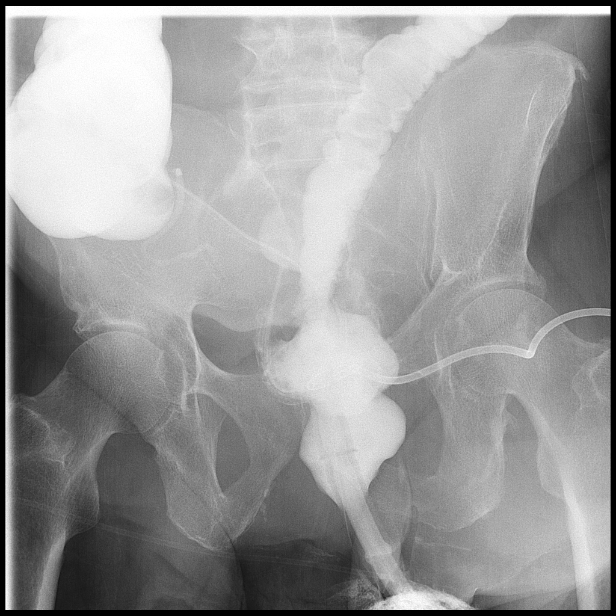
[frame 2/2]
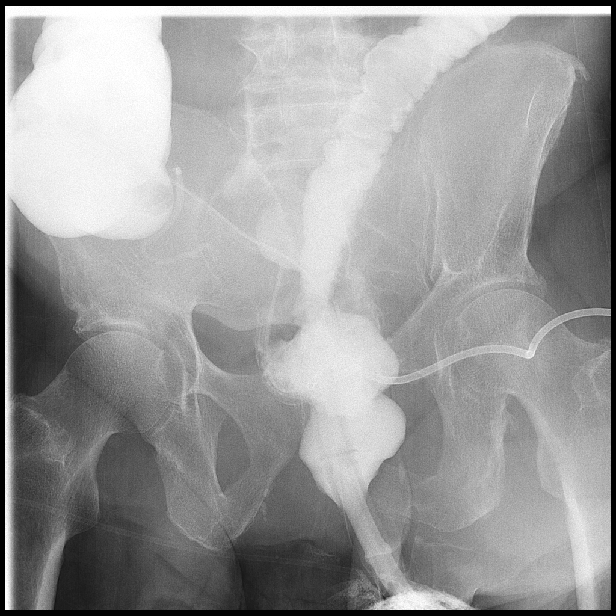

[t abdomen (2 of 2)]
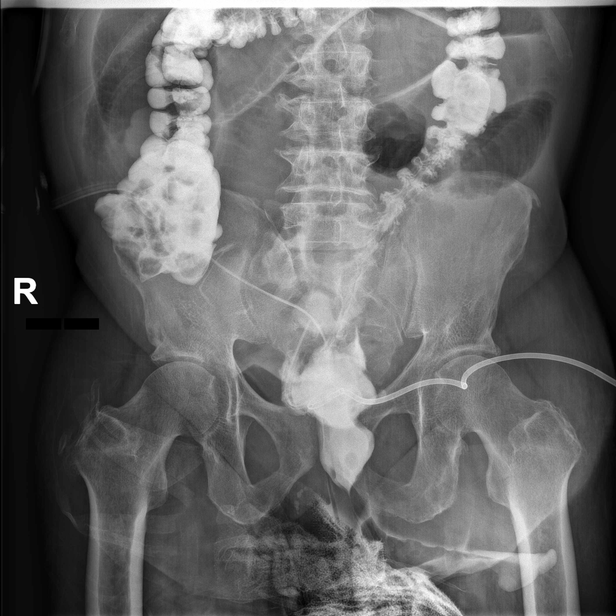

[15 of 23 positions shown; findings below may reference images not displayed]

FINDINGS: Extravasation of contrast noted along the right-side of the
anastomosis in the region of the right-sided drainage 2. Possible
second area of extravasation noted on the left side although this
could communicate with the other area.

The rest of the colon is unremarkable.
IMPRESSION: Extravasation of contrast suggesting an anastomotic leak.

## 2023-09-08 NOTE — Telephone Encounter (Signed)
Telephone call
